# Patient Record
Sex: Female | Born: 1945 | Race: White | Hispanic: No | Marital: Married | Smoking: Former smoker
Health system: Southern US, Community
[De-identification: ages and names within clinical notes are randomized; demographics above are authoritative.]

## PROBLEM LIST (undated history)

## (undated) DIAGNOSIS — M4800 Spinal stenosis, site unspecified: Secondary | ICD-10-CM

## (undated) DIAGNOSIS — Z7901 Long term (current) use of anticoagulants: Secondary | ICD-10-CM

## (undated) DIAGNOSIS — Z8679 Personal history of other diseases of the circulatory system: Secondary | ICD-10-CM

## (undated) DIAGNOSIS — Z955 Presence of coronary angioplasty implant and graft: Secondary | ICD-10-CM

## (undated) DIAGNOSIS — Z9889 Other specified postprocedural states: Secondary | ICD-10-CM

## (undated) DIAGNOSIS — Z85828 Personal history of other malignant neoplasm of skin: Secondary | ICD-10-CM

## (undated) DIAGNOSIS — I480 Paroxysmal atrial fibrillation: Secondary | ICD-10-CM

## (undated) DIAGNOSIS — R251 Tremor, unspecified: Secondary | ICD-10-CM

## (undated) DIAGNOSIS — M79604 Pain in right leg: Secondary | ICD-10-CM

## (undated) DIAGNOSIS — K08109 Complete loss of teeth, unspecified cause, unspecified class: Secondary | ICD-10-CM

## (undated) DIAGNOSIS — Z8719 Personal history of other diseases of the digestive system: Secondary | ICD-10-CM

## (undated) DIAGNOSIS — I2510 Atherosclerotic heart disease of native coronary artery without angina pectoris: Secondary | ICD-10-CM

## (undated) DIAGNOSIS — K219 Gastro-esophageal reflux disease without esophagitis: Secondary | ICD-10-CM

## (undated) DIAGNOSIS — Z973 Presence of spectacles and contact lenses: Secondary | ICD-10-CM

## (undated) DIAGNOSIS — M1990 Unspecified osteoarthritis, unspecified site: Secondary | ICD-10-CM

## (undated) DIAGNOSIS — I10 Essential (primary) hypertension: Secondary | ICD-10-CM

## (undated) DIAGNOSIS — I341 Nonrheumatic mitral (valve) prolapse: Secondary | ICD-10-CM

## (undated) DIAGNOSIS — Z972 Presence of dental prosthetic device (complete) (partial): Secondary | ICD-10-CM

## (undated) DIAGNOSIS — Z87442 Personal history of urinary calculi: Secondary | ICD-10-CM

## (undated) DIAGNOSIS — R7301 Impaired fasting glucose: Secondary | ICD-10-CM

## (undated) DIAGNOSIS — Z87440 Personal history of urinary (tract) infections: Secondary | ICD-10-CM

## (undated) DIAGNOSIS — M79605 Pain in left leg: Secondary | ICD-10-CM

## (undated) HISTORY — DX: Spinal stenosis, site unspecified: M48.00

## (undated) HISTORY — PX: CARDIAC ELECTROPHYSIOLOGY MAPPING AND ABLATION: SHX1292

## (undated) HISTORY — DX: Essential (primary) hypertension: I10

## (undated) HISTORY — DX: Pain in left leg: M79.604

## (undated) HISTORY — PX: PILONIDAL CYST EXCISION: SHX744

## (undated) HISTORY — PX: BACK SURGERY: SHX140

## (undated) HISTORY — PX: CARDIOVASCULAR STRESS TEST: SHX262

## (undated) HISTORY — DX: Atherosclerotic heart disease of native coronary artery without angina pectoris: I25.10

## (undated) HISTORY — PX: TRANSTHORACIC ECHOCARDIOGRAM: SHX275

## (undated) HISTORY — PX: CATARACT EXTRACTION W/ INTRAOCULAR LENS  IMPLANT, BILATERAL: SHX1307

## (undated) HISTORY — PX: CARDIAC CATHETERIZATION: SHX172

## (undated) HISTORY — PX: CORONARY ANGIOPLASTY WITH STENT PLACEMENT: SHX49

## (undated) HISTORY — DX: Tremor, unspecified: R25.1

## (undated) HISTORY — PX: CARPAL TUNNEL RELEASE: SHX101

---
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
## 1971-07-03 HISTORY — PX: APPENDECTOMY: SHX54

## 1971-07-03 HISTORY — PX: CHOLECYSTECTOMY OPEN: SUR202

## 1973-07-02 HISTORY — PX: VAGINAL HYSTERECTOMY: SUR661

## 2007-07-03 HISTORY — PX: MOHS SURGERY: SHX181

## 2009-04-01 ENCOUNTER — Ambulatory Visit: Payer: Self-pay | Admitting: Internal Medicine

## 2009-04-01 ENCOUNTER — Inpatient Hospital Stay (HOSPITAL_COMMUNITY): Admission: EM | Admit: 2009-04-01 | Discharge: 2009-04-05 | Payer: Self-pay | Admitting: Internal Medicine

## 2009-04-01 ENCOUNTER — Encounter: Payer: Self-pay | Admitting: Internal Medicine

## 2009-04-01 DIAGNOSIS — I10 Essential (primary) hypertension: Secondary | ICD-10-CM

## 2009-04-01 DIAGNOSIS — I200 Unstable angina: Secondary | ICD-10-CM

## 2009-04-01 IMAGING — CR DG CHEST 2V
2 series · 2 of 2 positions shown · non-contrast
Comparison: None

CLINICAL DATA: Unstable angina.

CHEST - 2 VIEW

[w chest pa]
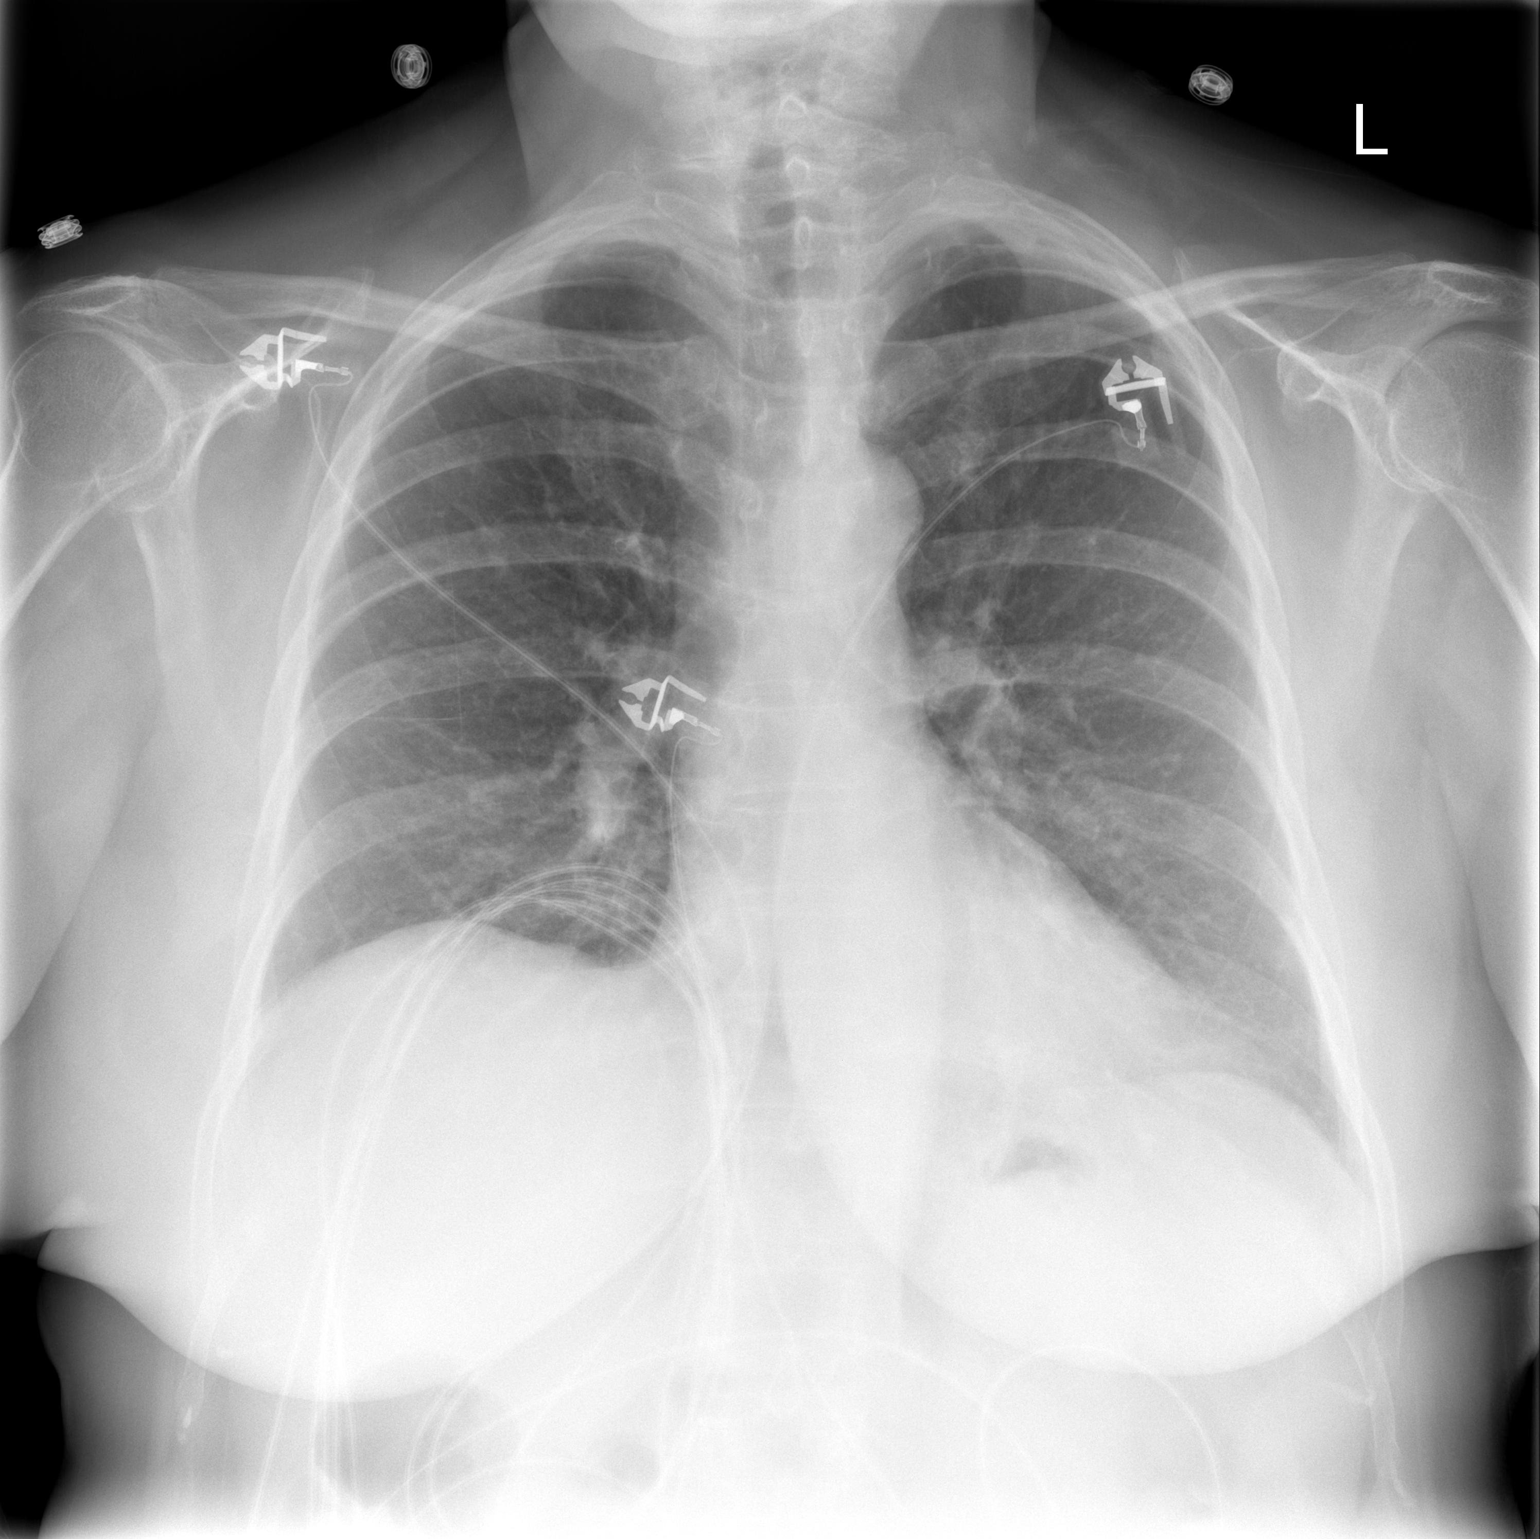

[w chest lat]
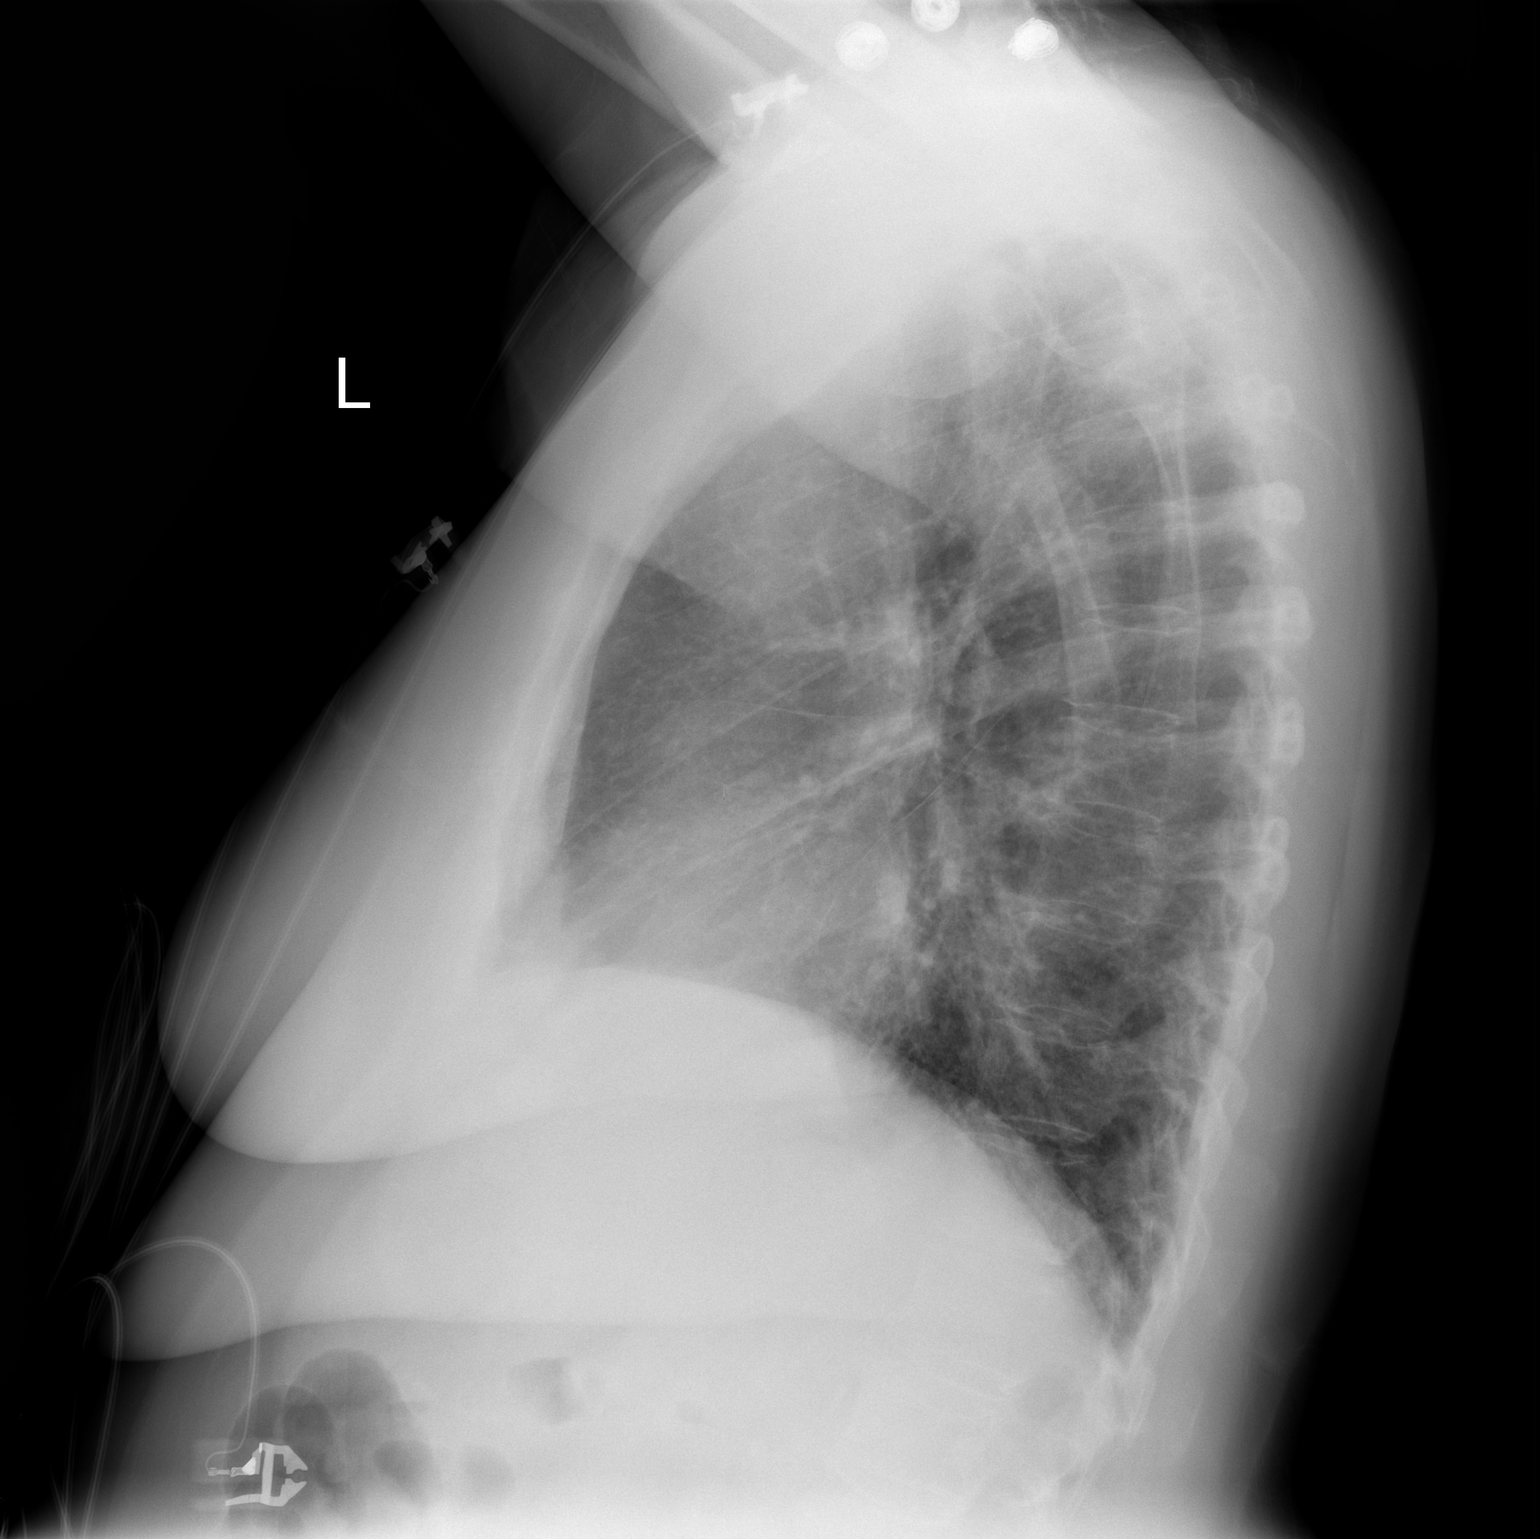

[2 of 2 positions shown; findings below may reference images not displayed]

FINDINGS: The heart size is normal.  Both lungs are clear.  There
is no evidence of pleural effusion.  No mass or adenopathy
identified.
IMPRESSION: No active disease.

## 2009-04-04 ENCOUNTER — Encounter: Payer: Self-pay | Admitting: Internal Medicine

## 2009-04-04 DIAGNOSIS — Z955 Presence of coronary angioplasty implant and graft: Secondary | ICD-10-CM

## 2009-04-04 HISTORY — DX: Presence of coronary angioplasty implant and graft: Z95.5

## 2009-04-11 ENCOUNTER — Encounter: Payer: Self-pay | Admitting: Physician Assistant

## 2009-04-11 ENCOUNTER — Ambulatory Visit: Payer: Self-pay | Admitting: Internal Medicine

## 2009-04-11 DIAGNOSIS — I2510 Atherosclerotic heart disease of native coronary artery without angina pectoris: Secondary | ICD-10-CM

## 2009-04-11 DIAGNOSIS — R0602 Shortness of breath: Secondary | ICD-10-CM

## 2009-04-11 DIAGNOSIS — R12 Heartburn: Secondary | ICD-10-CM

## 2009-04-11 DIAGNOSIS — E785 Hyperlipidemia, unspecified: Secondary | ICD-10-CM

## 2009-04-13 ENCOUNTER — Ambulatory Visit: Payer: Self-pay | Admitting: Internal Medicine

## 2009-04-13 IMAGING — CT CT ANGIO CHEST
1 of 8 series · 19 of 37 positions shown · IV contrast (Omnipaque 300)
Comparison: Chest x-ray [DATE]

CLINICAL DATA: Chest pain, tightness, shortness of breath and
cough.

CT ANGIOGRAPHY CHEST WITH CONTRAST
TECHNIQUE: Multidetector CT imaging of the chest was performed
using the standard protocol during bolus administration of
intravenous contrast. Multiplanar CT image reconstructions
including MIPs were obtained to evaluate the vascular anatomy.
Contrast: 80 ml [9A]

[Series 5: pe thins · axial · 0.67mm/px · z∈[-215,+15]mm · 19 of 256 slices shown]
[im 13/256  lung]
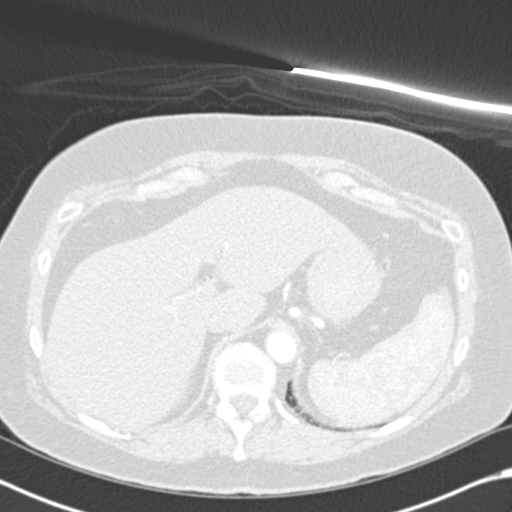
[im 26/256  mediastinal]
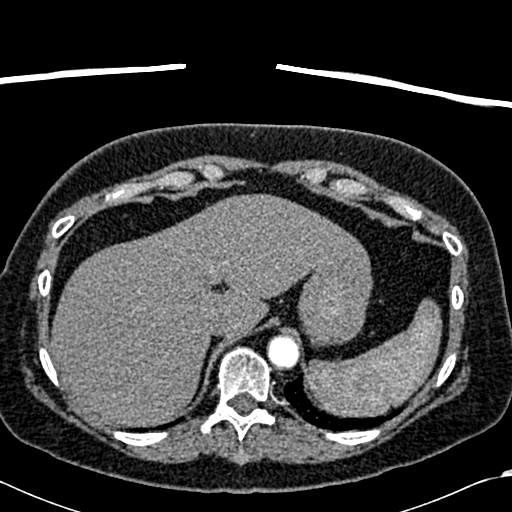
[im 39/256  lung]
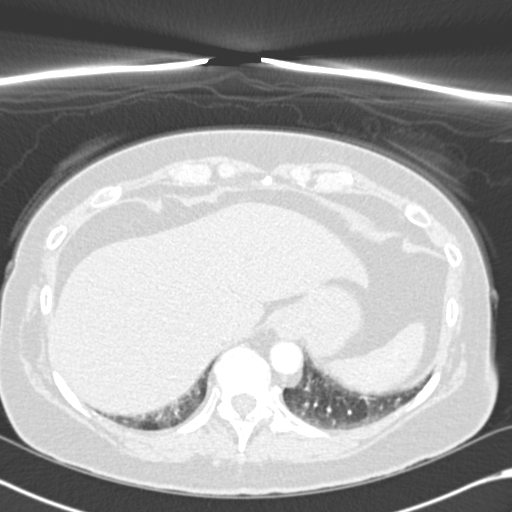
[im 52/256  mediastinal]
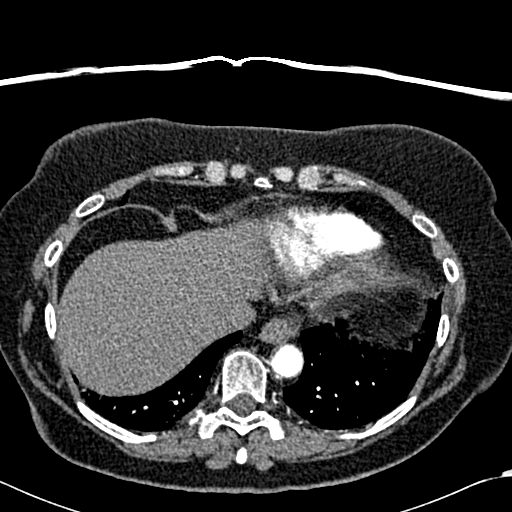
[im 64/256  lung]
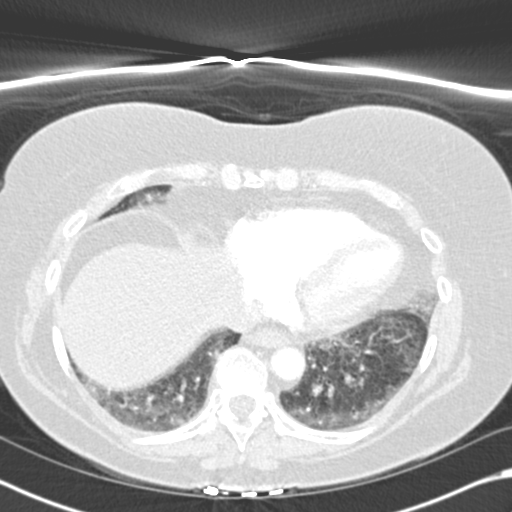
[im 77/256  mediastinal]
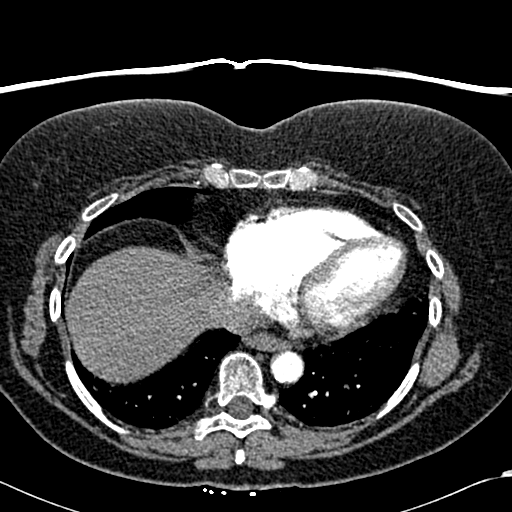
[im 90/256  lung]
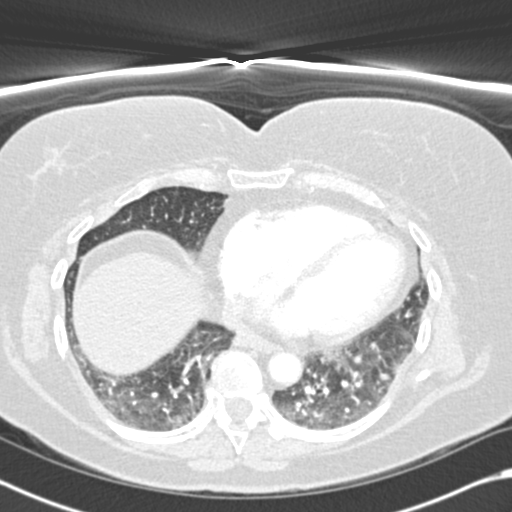
[im 103/256  mediastinal]
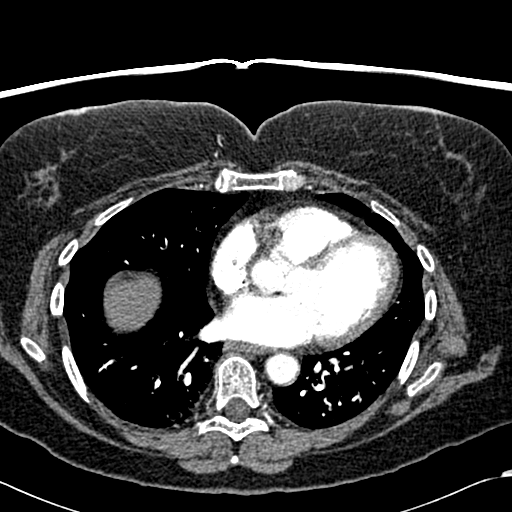
[im 115/256  lung]
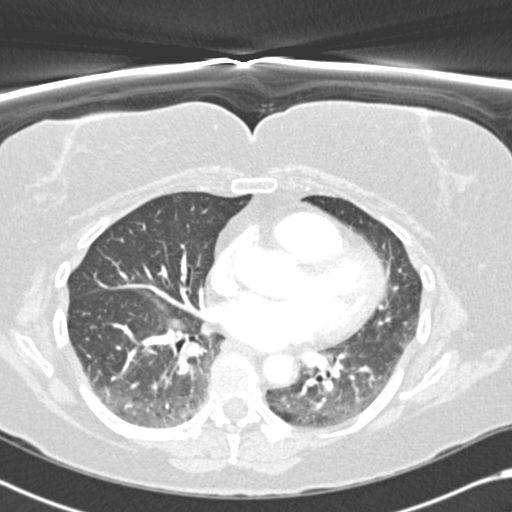
[im 128/256  mediastinal]
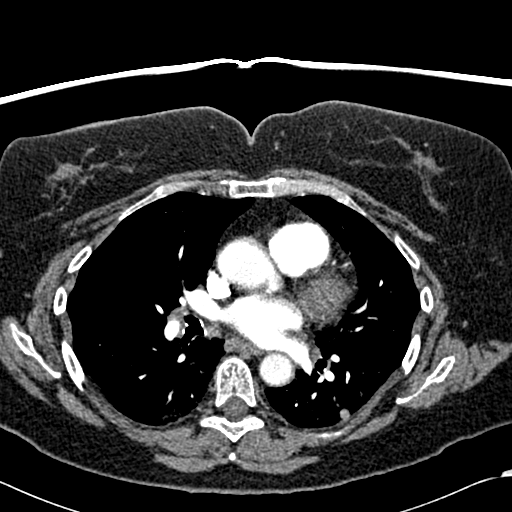
[im 141/256  lung]
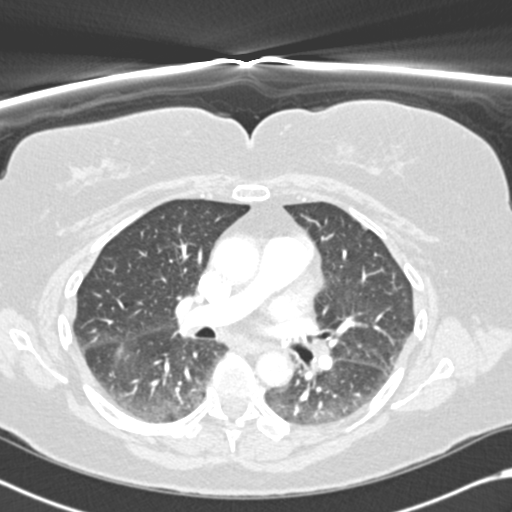
[im 154/256  mediastinal]
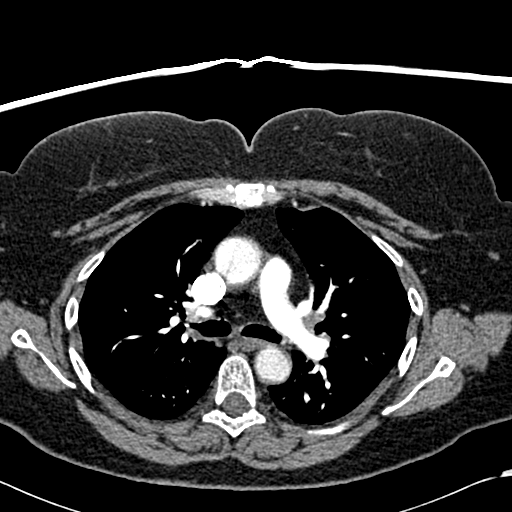
[im 166/256  lung]
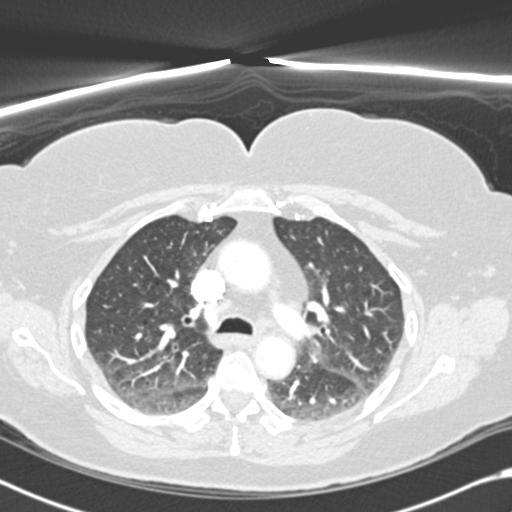
[im 179/256  mediastinal]
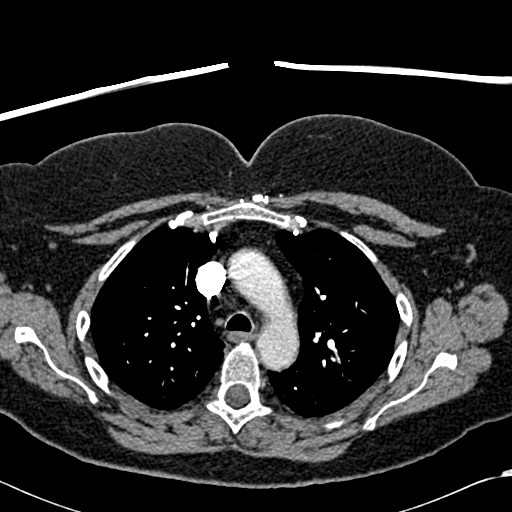
[im 192/256  lung]
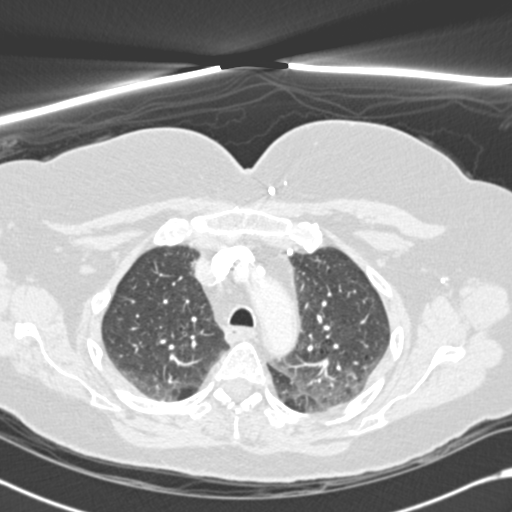
[im 205/256  mediastinal]
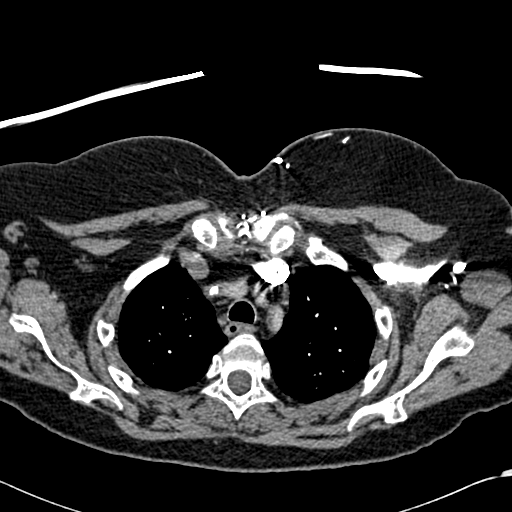
[im 217/256  lung]
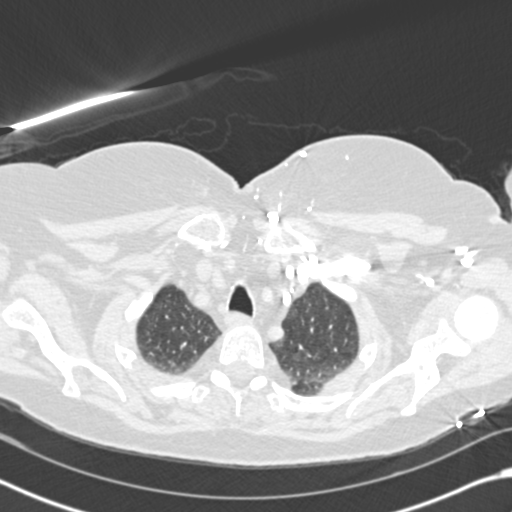
[im 230/256  mediastinal]
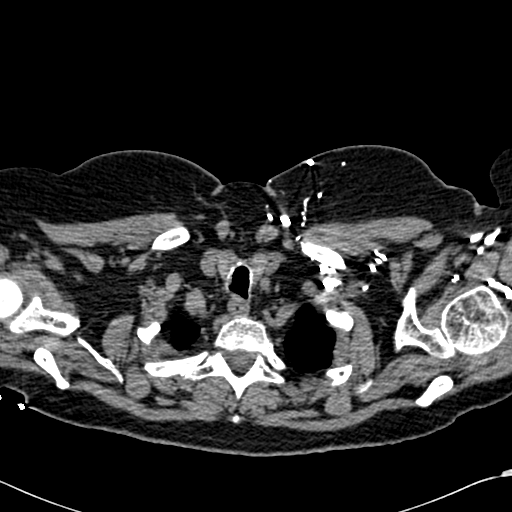
[im 243/256  lung]
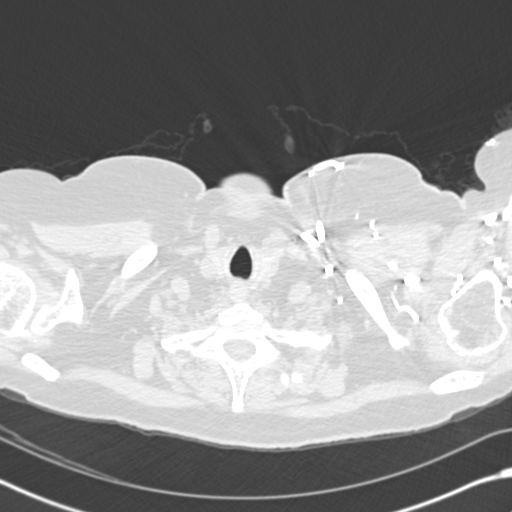

[19 of 37 positions shown; findings below may reference images not displayed]

FINDINGS: No pulmonary embolus.  Mediastinal lymph nodes measure
up to 9 mm in short axis, in the high right paratracheal station.
Bihilar lymphoid tissue is seen as well.  No axillary adenopathy.
Heart is at the upper limits of normal in size.  No pericardial
effusion.  Small hiatal hernia.

Lungs are mildly emphysematous.  There is dependent air space
disease seen predominantly in the lower lobes.  A 7 mm nodule is
seen in the superior segment left lower lobe (image 43).  There are
a few other scattered smaller pulmonary nodules, most of which are
perifissural in location. No pleural fluid.  Airway is
unremarkable.

Incidental imaging of the upper abdomen shows no acute findings.
No worrisome lytic or sclerotic lesions.

Review of the MIP images confirms the above findings.
IMPRESSION: 1.  No pulmonary embolus.
2.  Dependent air space disease may be due to atelectasis.  An
infectious process cannot be excluded.
3.  Left lower lobe nodule. If the patient is at high risk for
bronchogenic carcinoma, follow-up chest CT at 3-6 months is
recommended.  If the patient is at low risk for bronchogenic
carcinoma, follow-up chest CT at 6-12 months is recommended.  This
recommendation follows the consensus statement: "Guidelines for
Management of Small Pulmonary Nodules Detected on CT Scans: A
Statement from the [HOSPITAL]" as published in Radiology
[9A]; [DATE]. Online at:
[URL]
4.  Mediastinal and hilar lymph nodes may be reactive in etiology.

## 2009-04-14 ENCOUNTER — Encounter (HOSPITAL_COMMUNITY): Admission: RE | Admit: 2009-04-14 | Discharge: 2009-07-01 | Payer: Self-pay | Admitting: Internal Medicine

## 2009-04-14 ENCOUNTER — Encounter: Payer: Self-pay | Admitting: Internal Medicine

## 2009-04-15 ENCOUNTER — Telehealth: Payer: Self-pay | Admitting: Internal Medicine

## 2009-04-27 ENCOUNTER — Ambulatory Visit: Payer: Self-pay | Admitting: Internal Medicine

## 2009-04-27 ENCOUNTER — Ambulatory Visit: Payer: Self-pay | Admitting: Pulmonary Disease

## 2009-04-27 DIAGNOSIS — E119 Type 2 diabetes mellitus without complications: Secondary | ICD-10-CM

## 2009-04-27 DIAGNOSIS — Z85828 Personal history of other malignant neoplasm of skin: Secondary | ICD-10-CM | POA: Insufficient documentation

## 2009-04-27 DIAGNOSIS — J84112 Idiopathic pulmonary fibrosis: Secondary | ICD-10-CM | POA: Insufficient documentation

## 2009-04-27 LAB — CONVERTED CEMR LAB
Basophils Absolute: 0 10*3/uL (ref 0.0–0.1)
Eosinophils Absolute: 0.2 10*3/uL (ref 0.0–0.7)
HCT: 37.7 % (ref 36.0–46.0)
Hemoglobin: 13 g/dL (ref 12.0–15.0)
Lymphs Abs: 1.2 10*3/uL (ref 0.7–4.0)
MCHC: 34.5 g/dL (ref 30.0–36.0)
MCV: 95.4 fL (ref 78.0–100.0)
Monocytes Absolute: 0.6 10*3/uL (ref 0.1–1.0)
Neutro Abs: 3.1 10*3/uL (ref 1.4–7.7)
Platelets: 182 10*3/uL (ref 150.0–400.0)
RDW: 12.7 % (ref 11.5–14.6)

## 2009-04-28 DIAGNOSIS — J984 Other disorders of lung: Secondary | ICD-10-CM

## 2009-05-11 ENCOUNTER — Ambulatory Visit: Payer: Self-pay | Admitting: Internal Medicine

## 2009-05-12 ENCOUNTER — Encounter: Payer: Self-pay | Admitting: Internal Medicine

## 2009-07-02 ENCOUNTER — Encounter (HOSPITAL_COMMUNITY): Admission: RE | Admit: 2009-07-02 | Discharge: 2009-08-05 | Payer: Self-pay | Admitting: Internal Medicine

## 2009-07-20 ENCOUNTER — Observation Stay (HOSPITAL_COMMUNITY): Admission: EM | Admit: 2009-07-20 | Discharge: 2009-07-21 | Payer: Self-pay | Admitting: Emergency Medicine

## 2009-07-20 ENCOUNTER — Encounter: Payer: Self-pay | Admitting: Internal Medicine

## 2009-07-20 ENCOUNTER — Ambulatory Visit: Payer: Self-pay | Admitting: Internal Medicine

## 2009-07-20 ENCOUNTER — Ambulatory Visit: Payer: Self-pay | Admitting: Cardiovascular Disease

## 2009-07-20 ENCOUNTER — Emergency Department (HOSPITAL_COMMUNITY): Admission: EM | Admit: 2009-07-20 | Discharge: 2009-07-20 | Payer: Self-pay | Admitting: Emergency Medicine

## 2009-07-20 IMAGING — CR DG CHEST 1V PORT
1 series · 1 of 1 positions shown · non-contrast
Comparison: [DATE]

CLINICAL DATA: New onset atrial fibrillation.

PORTABLE CHEST - 1 VIEW

[view not recorded]
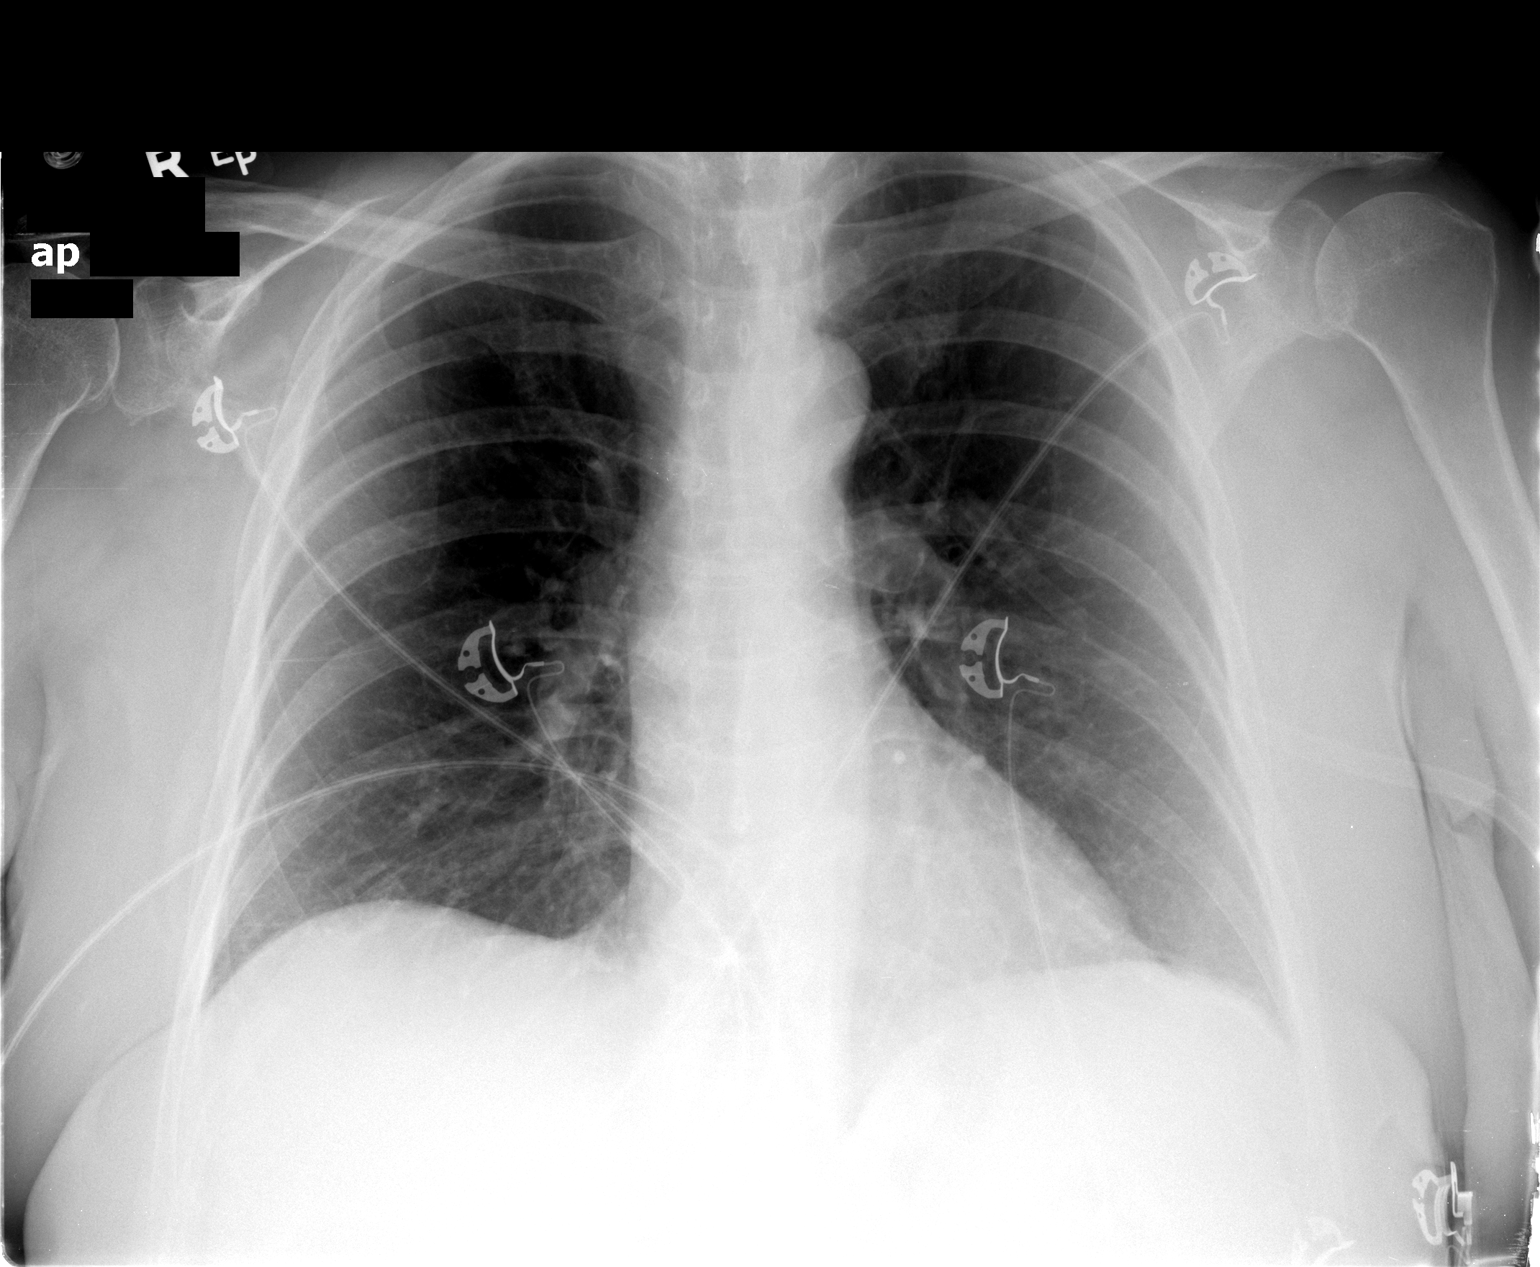

[1 of 1 positions shown; findings below may reference images not displayed]

FINDINGS: The cardiomediastinal silhouette is unremarkable.
The lungs are clear.
There is no evidence of focal airspace disease, pulmonary edema,
pleural effusion, or pneumothorax.
No acute bony abnormalities are identified.
IMPRESSION: No evidence of acute cardiopulmonary disease.

## 2009-07-22 ENCOUNTER — Encounter: Payer: Self-pay | Admitting: Internal Medicine

## 2009-07-25 ENCOUNTER — Ambulatory Visit: Payer: Self-pay | Admitting: Cardiology

## 2009-08-01 ENCOUNTER — Ambulatory Visit: Payer: Self-pay | Admitting: Internal Medicine

## 2009-08-01 ENCOUNTER — Ambulatory Visit (HOSPITAL_COMMUNITY): Admission: RE | Admit: 2009-08-01 | Discharge: 2009-08-01 | Payer: Self-pay | Admitting: Internal Medicine

## 2009-08-01 ENCOUNTER — Encounter: Payer: Self-pay | Admitting: Internal Medicine

## 2009-08-01 ENCOUNTER — Ambulatory Visit: Payer: Self-pay

## 2009-08-01 ENCOUNTER — Ambulatory Visit: Payer: Self-pay | Admitting: Cardiology

## 2009-08-01 ENCOUNTER — Encounter (INDEPENDENT_AMBULATORY_CARE_PROVIDER_SITE_OTHER): Payer: Self-pay | Admitting: Cardiology

## 2009-08-01 DIAGNOSIS — I4891 Unspecified atrial fibrillation: Secondary | ICD-10-CM

## 2009-08-01 LAB — CONVERTED CEMR LAB: POC INR: 2.6

## 2009-08-08 ENCOUNTER — Ambulatory Visit: Payer: Self-pay | Admitting: Cardiovascular Disease

## 2009-08-10 ENCOUNTER — Telehealth: Payer: Self-pay | Admitting: Internal Medicine

## 2009-08-17 ENCOUNTER — Ambulatory Visit: Payer: Self-pay | Admitting: Internal Medicine

## 2009-08-19 ENCOUNTER — Encounter: Payer: Self-pay | Admitting: Internal Medicine

## 2009-09-05 ENCOUNTER — Ambulatory Visit: Payer: Self-pay | Admitting: Internal Medicine

## 2009-09-05 LAB — CONVERTED CEMR LAB: POC INR: 1.6

## 2009-09-19 ENCOUNTER — Encounter (INDEPENDENT_AMBULATORY_CARE_PROVIDER_SITE_OTHER): Payer: Self-pay | Admitting: Cardiology

## 2009-09-19 ENCOUNTER — Ambulatory Visit: Payer: Self-pay | Admitting: Cardiology

## 2009-09-19 LAB — CONVERTED CEMR LAB: POC INR: 1.5

## 2009-09-26 ENCOUNTER — Ambulatory Visit: Payer: Self-pay | Admitting: Cardiovascular Disease

## 2009-09-26 LAB — CONVERTED CEMR LAB
INR: 1.9
POC INR: 1.9

## 2009-10-03 ENCOUNTER — Ambulatory Visit: Payer: Self-pay | Admitting: Cardiovascular Disease

## 2009-10-03 LAB — CONVERTED CEMR LAB: POC INR: 2

## 2009-10-04 ENCOUNTER — Telehealth (INDEPENDENT_AMBULATORY_CARE_PROVIDER_SITE_OTHER): Payer: Self-pay | Admitting: *Deleted

## 2009-10-17 ENCOUNTER — Encounter: Payer: Self-pay | Admitting: Internal Medicine

## 2009-10-17 ENCOUNTER — Ambulatory Visit: Payer: Self-pay | Admitting: Cardiology

## 2009-10-24 ENCOUNTER — Ambulatory Visit: Payer: Self-pay | Admitting: Cardiovascular Disease

## 2009-10-27 ENCOUNTER — Encounter: Payer: Self-pay | Admitting: Cardiovascular Disease

## 2009-10-28 ENCOUNTER — Encounter: Payer: Self-pay | Admitting: Cardiovascular Disease

## 2009-11-01 ENCOUNTER — Telehealth (INDEPENDENT_AMBULATORY_CARE_PROVIDER_SITE_OTHER): Payer: Self-pay | Admitting: *Deleted

## 2009-11-11 ENCOUNTER — Ambulatory Visit: Payer: Self-pay | Admitting: Cardiology

## 2009-12-02 ENCOUNTER — Ambulatory Visit: Payer: Self-pay | Admitting: Cardiovascular Disease

## 2009-12-02 LAB — CONVERTED CEMR LAB: POC INR: 3.8

## 2009-12-06 IMAGING — CR DG CHEST 2V
2 series · 2 of 2 positions shown · non-contrast
Comparison: [DATE].

CLINICAL DATA: Chest pain and shortness of breath.

CHEST - 2 VIEW

[w chest pa]
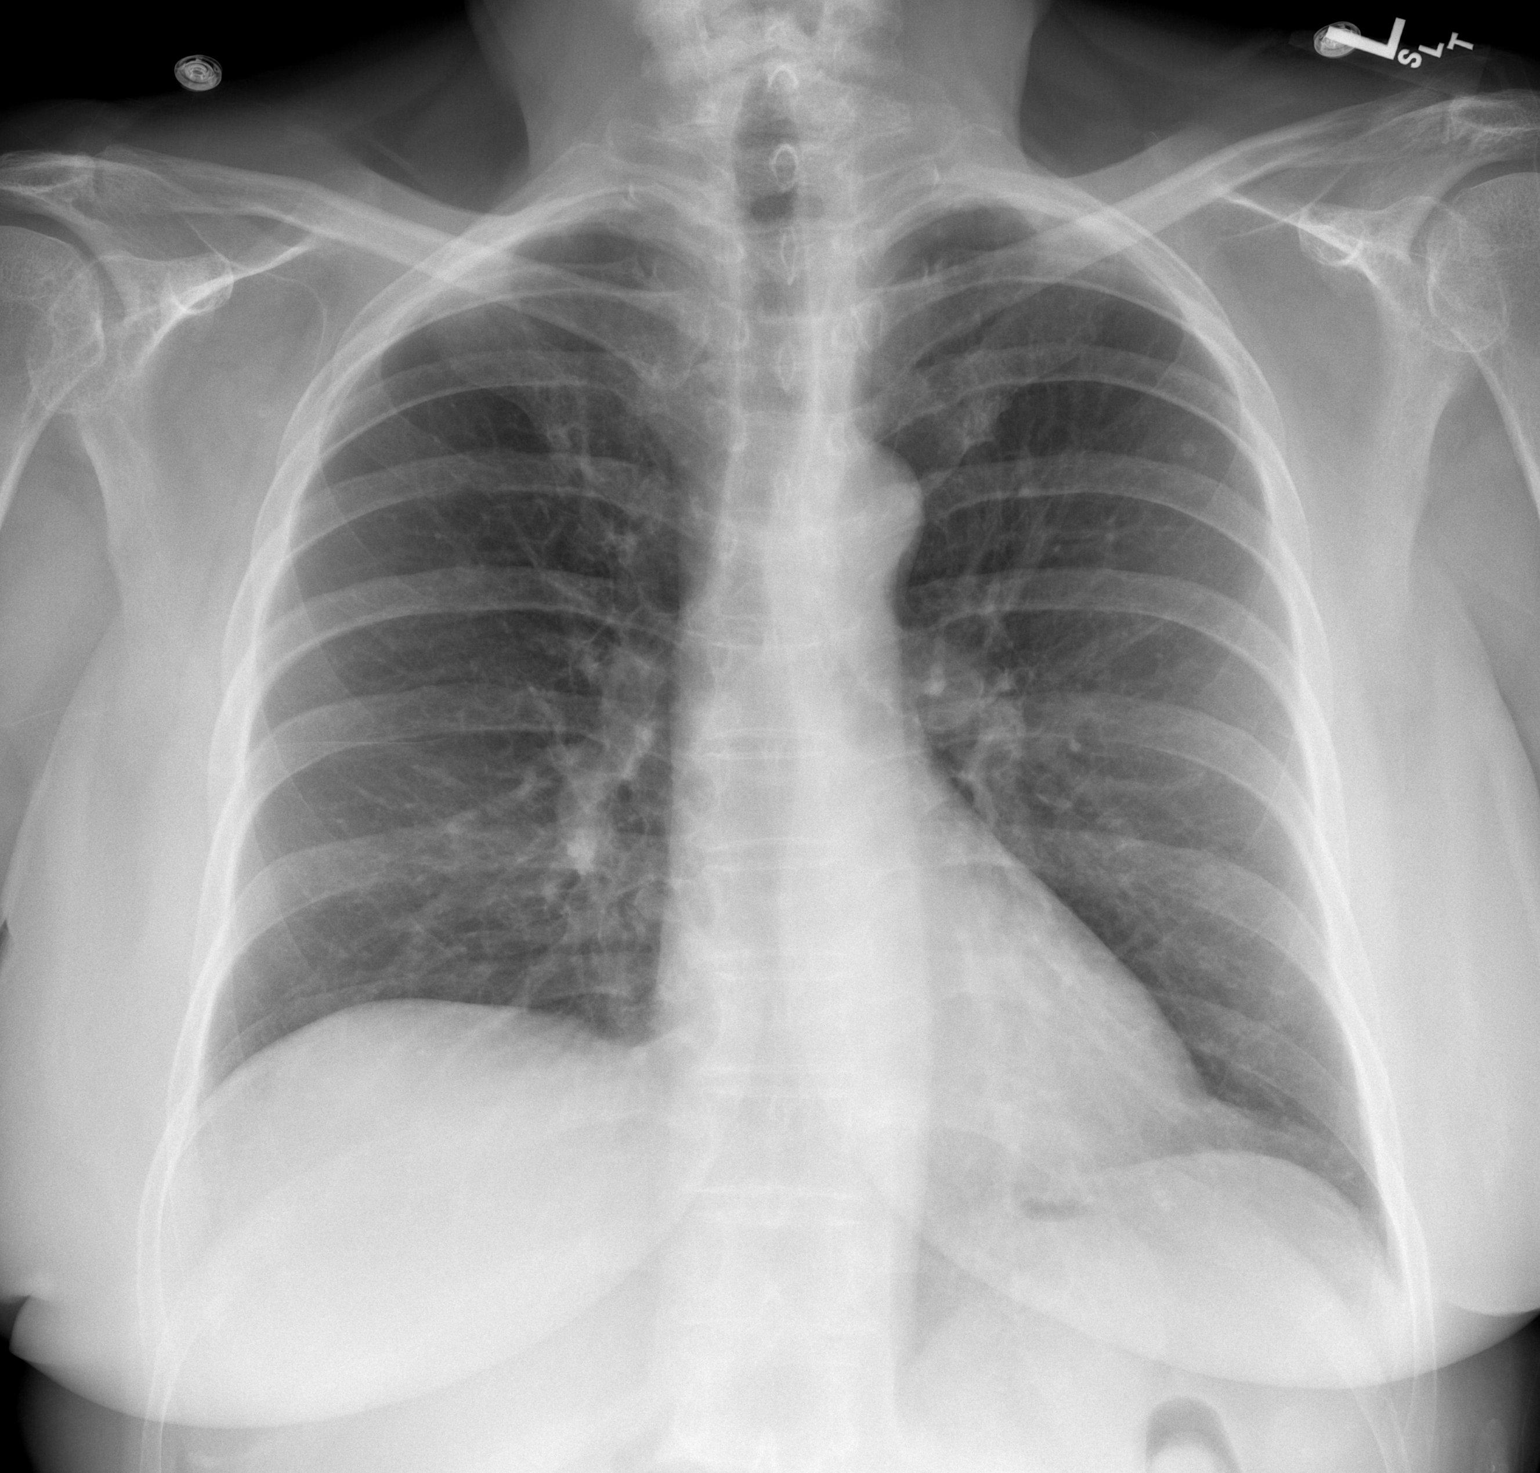

[w chest lat]
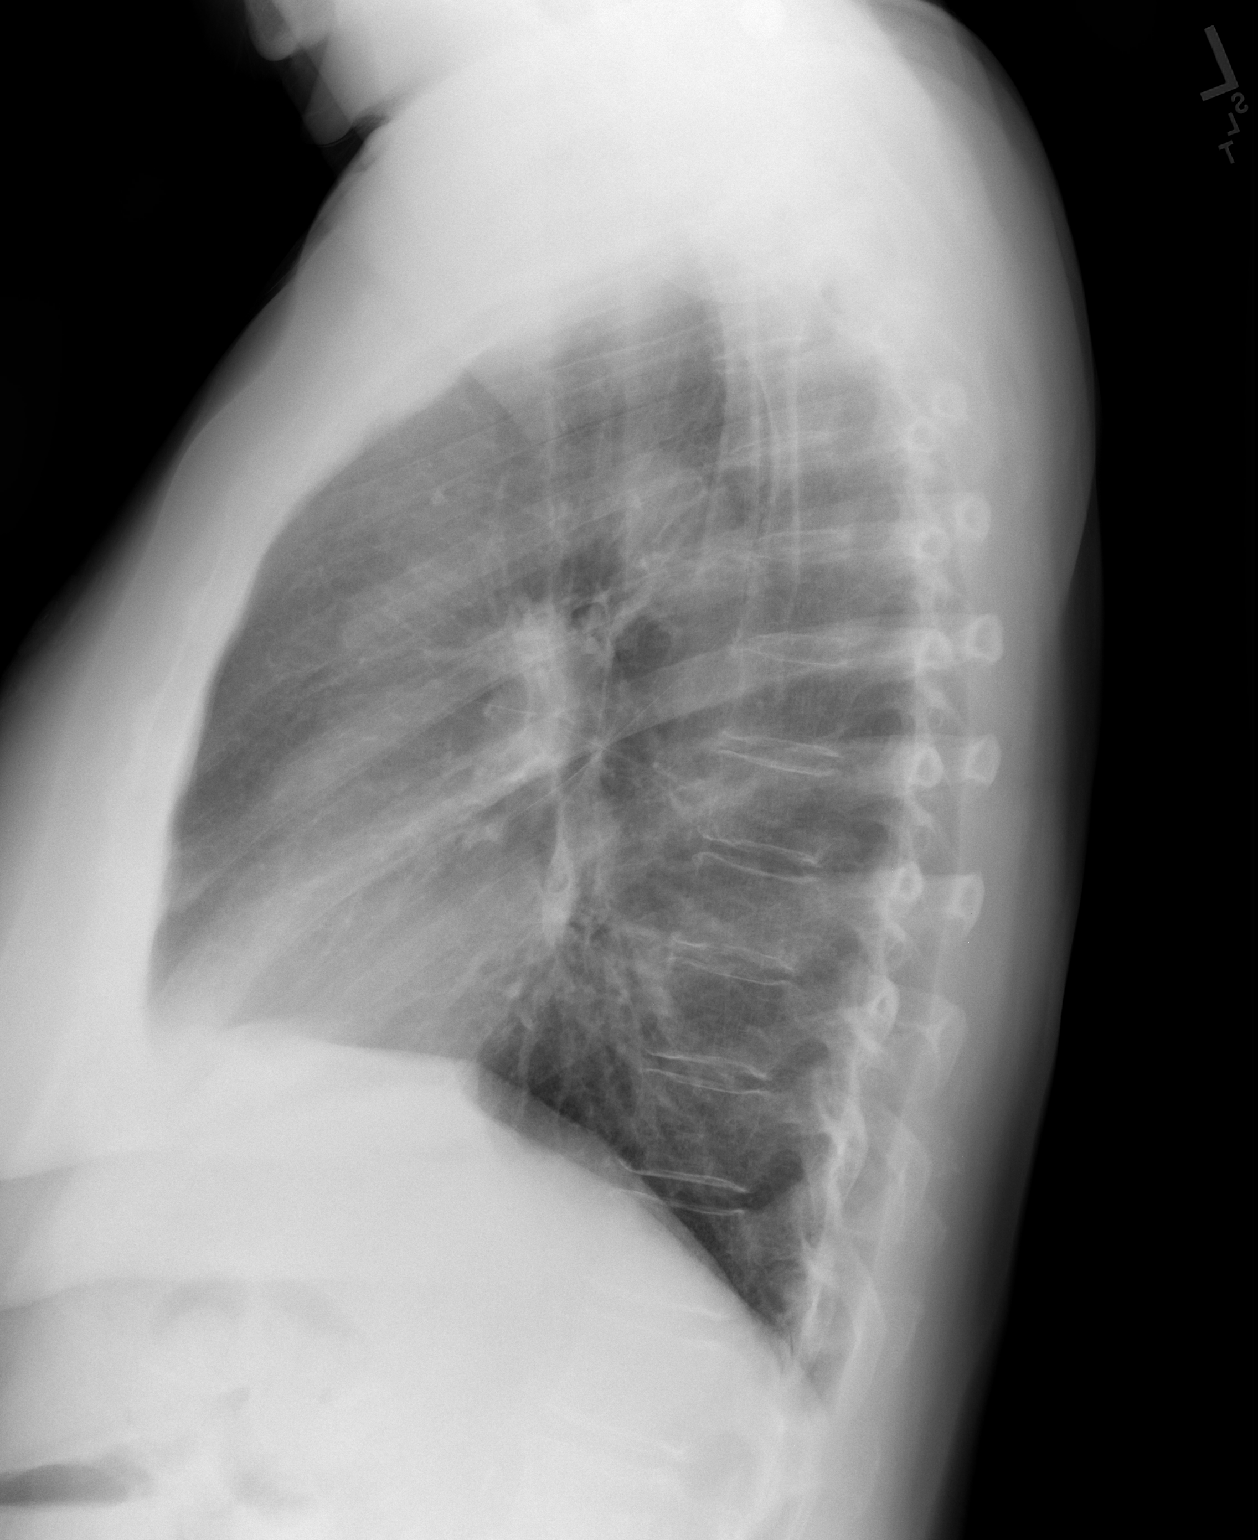

[2 of 2 positions shown; findings below may reference images not displayed]

FINDINGS: The heart size and mediastinal contours are normal.  The
lungs appear clear.  There is no pleural effusion.  No acute
osseous findings are demonstrated.
IMPRESSION: Stable examination.  No active cardiopulmonary process.

## 2009-12-07 ENCOUNTER — Ambulatory Visit: Payer: Self-pay | Admitting: Cardiology

## 2009-12-07 ENCOUNTER — Ambulatory Visit: Payer: Self-pay | Admitting: Family Medicine

## 2009-12-07 IMAGING — CT CT ABD-PELV W/O CM
2 of 4 series · 17 of 46 positions shown, 19 images · non-contrast
Comparison: None.

CLINICAL DATA: Left flank pain.  No known injury.  Evaluate for
hematoma.  The patient is on blood thinners.

CT ABDOMEN AND PELVIS WITHOUT CONTRAST
TECHNIQUE: Multidetector CT imaging of the abdomen and pelvis was
performed following the standard protocol without intravenous
contrast.

[Series 2: abd/pelv w/o 5.0 b31f st · axial · non-contrast · 0.81mm/px · z∈[-542,-96]mm · 14 of 99 slices shown, 16 images]
[im 5/99  soft-tissue]
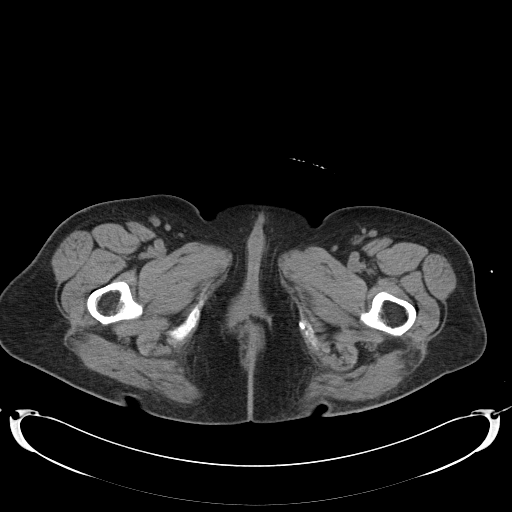
[im 5/99  bone]
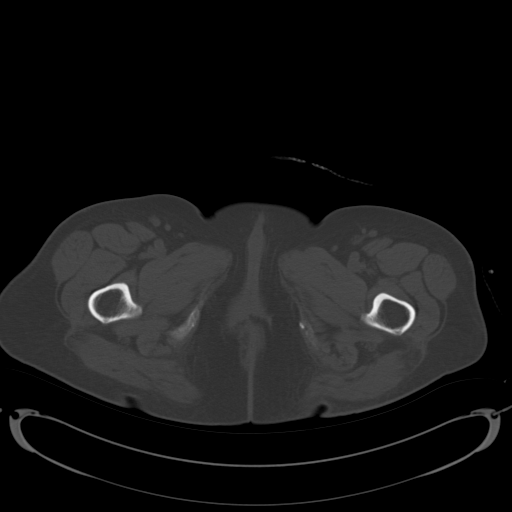
[im 13/99  soft-tissue]
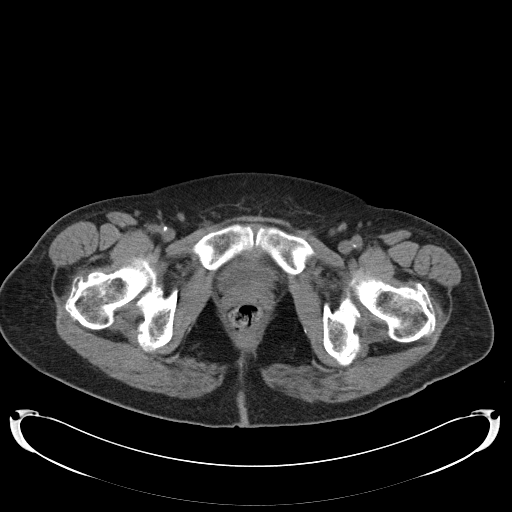
[im 21/99  soft-tissue]
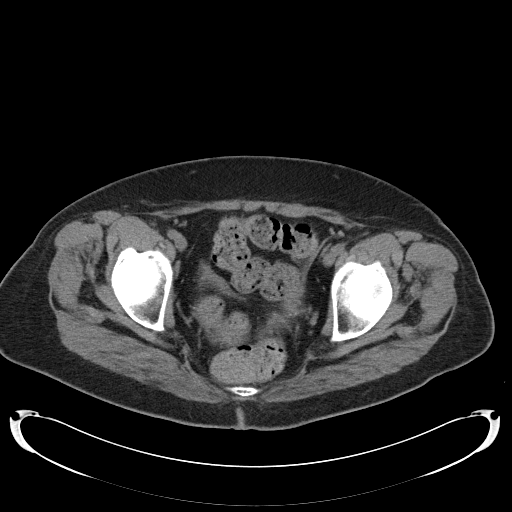
[im 25/99  soft-tissue]
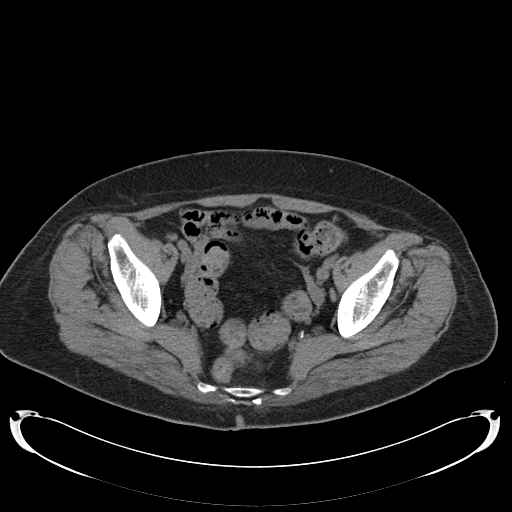
[im 33/99  soft-tissue]
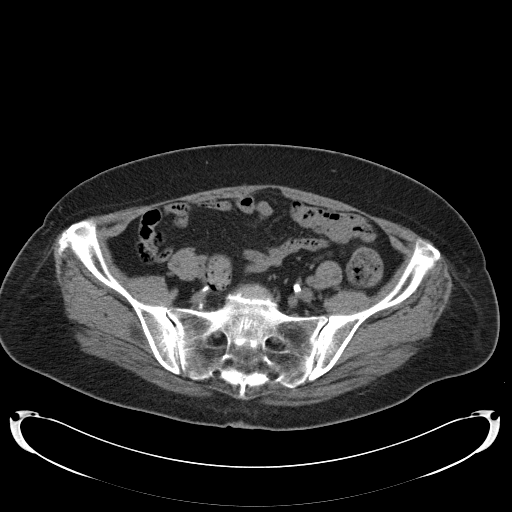
[im 41/99  soft-tissue]
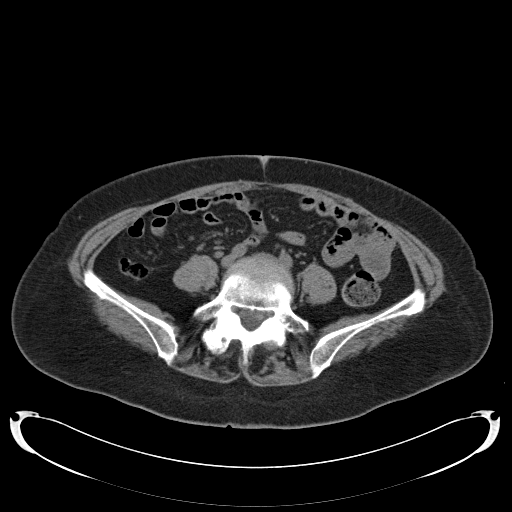
[im 45/99  soft-tissue]
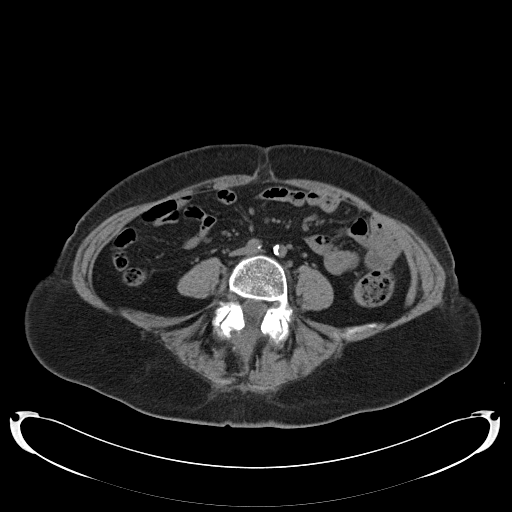
[im 54/99  soft-tissue]
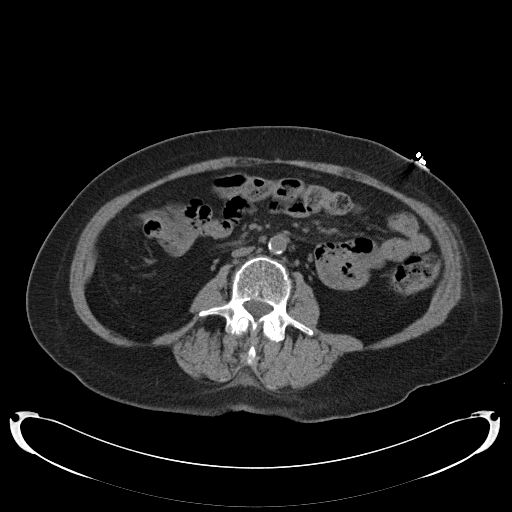
[im 58/99  soft-tissue]
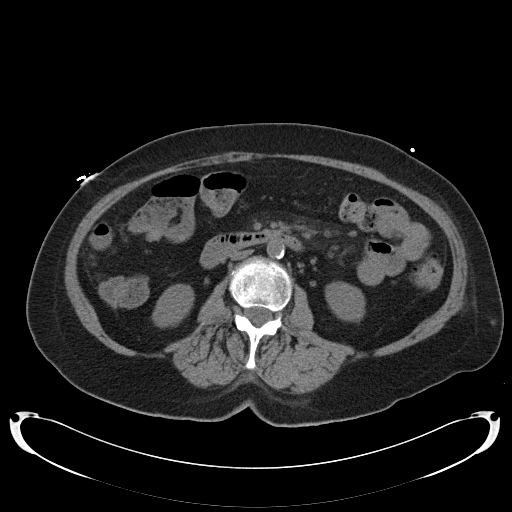
[im 58/99  bone]
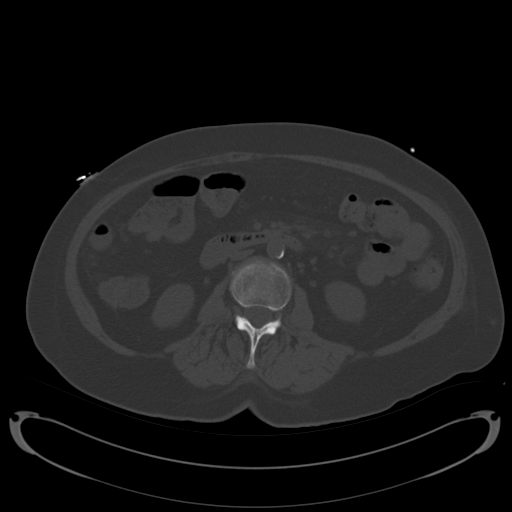
[im 66/99  soft-tissue]
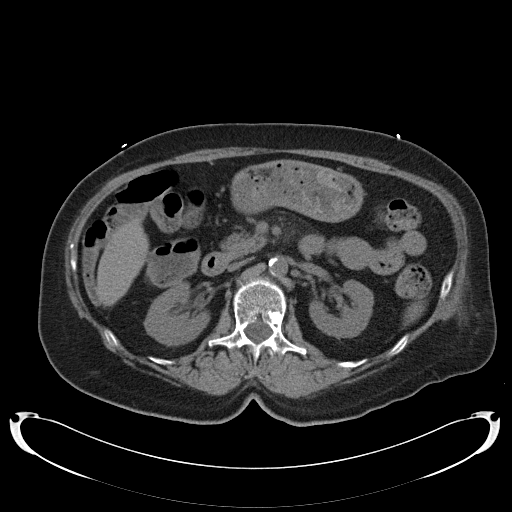
[im 74/99  soft-tissue]
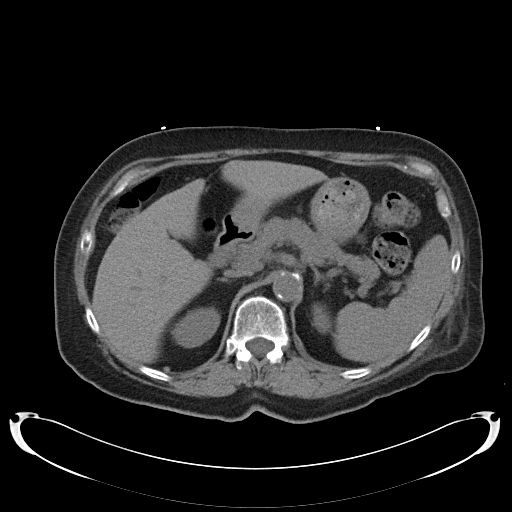
[im 78/99  soft-tissue]
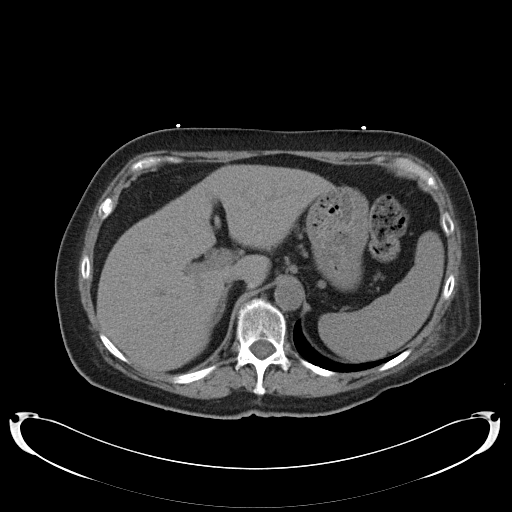
[im 86/99  soft-tissue]
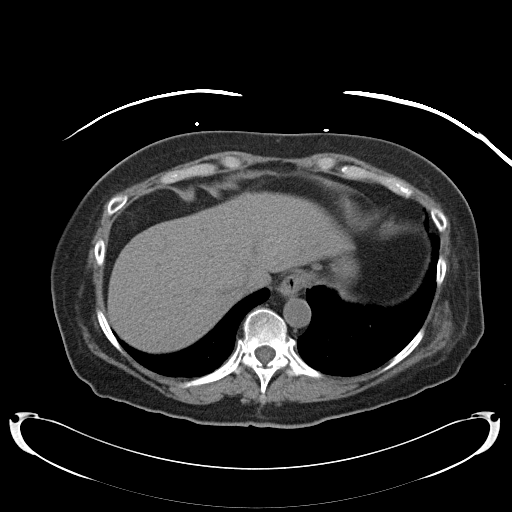
[im 94/99  soft-tissue]
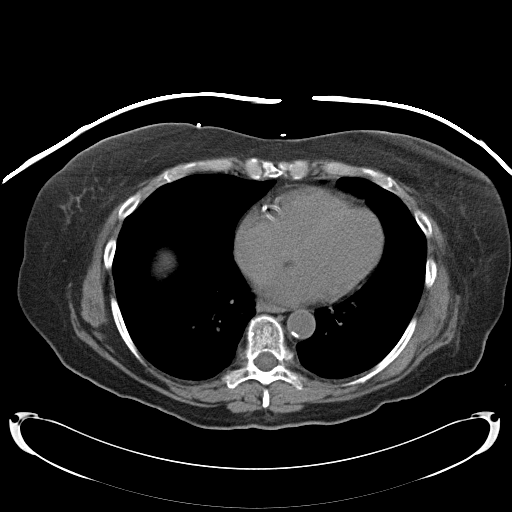

[Series 4: abd/pelv w/o 2.0 spo thins · coronal · non-contrast · 0.96mm/px · 3 of 120 slices shown]
[im 40/120  soft-tissue]
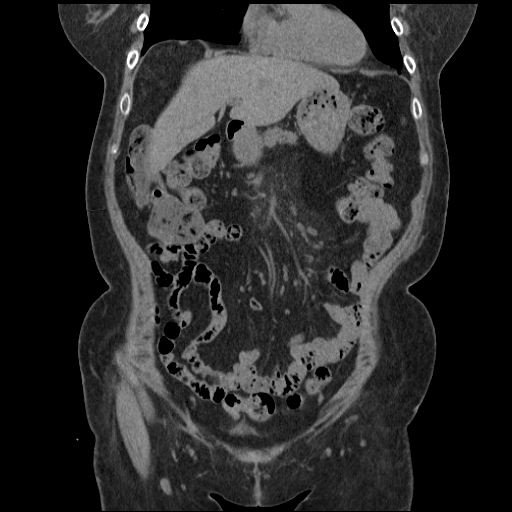
[im 53/120  soft-tissue]
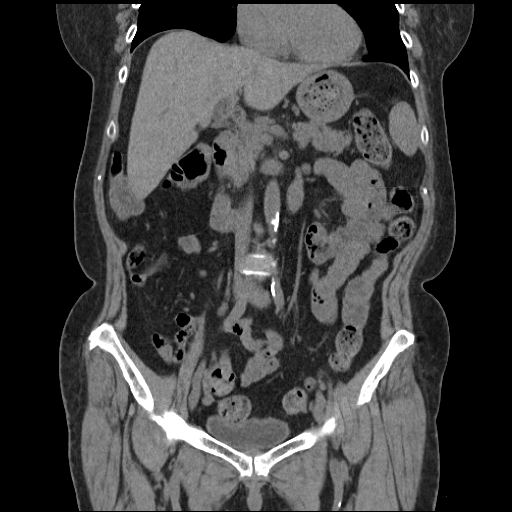
[im 67/120  soft-tissue]
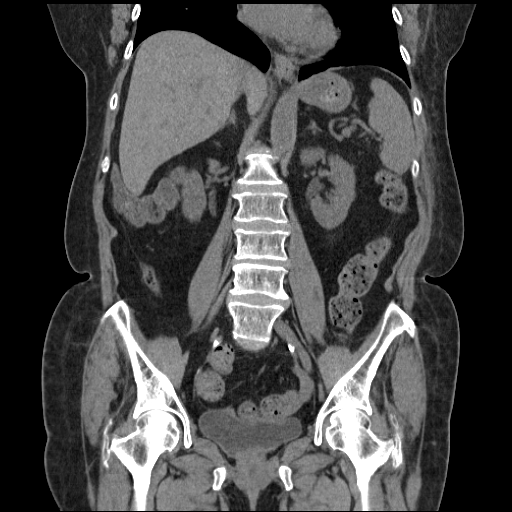

[17 of 46 positions shown; findings below may reference images not displayed]

FINDINGS: There are three sub centimeter subpleural nodules at the
left lung base on images 1, 5 and 10.  The right lung base is
clear.  There is no pleural effusion.

There is no evidence of intraperitoneal, retroperitoneal or
abdominal wall hemorrhage.  There is no hemoperitoneum.  The bowel
gas pattern is normal.  There are no fluid collections.

The liver, spleen, adrenal glands and kidneys appear normal.  There
is no hydronephrosis or urinary tract calculus.  The pancreas
appears normal.  There are numerous small mesenteric lymph nodes
with associated soft tissue stranding throughout the mesenteric
fat.  There is no retroperitoneal adenopathy.

The uterus is surgically absent and there is no adnexal mass.
Urinary bladder appears normal.  There is lumbar spondylosis status
post laminectomy and L4-L5.
IMPRESSION: 1.  No evidence of intra-abdominal hemorrhage.
2.  Multiple small mesenteric lymph nodes are associated with
stranding in the base of the mesentery.  This is a nonspecific
finding which can be seen with mesenteric adenitis.  An early or
treated lymphoproliferative process could have this appearance.  No
pathologically enlarged lymph nodes are identified.  Clinical
correlation and CT follow-up are recommended.
3.  No hydronephrosis or ureteral calculus.
4.  Small nodules at the left lung base are stable compared with
the prior chest CT.

## 2009-12-08 ENCOUNTER — Telehealth: Payer: Self-pay | Admitting: Internal Medicine

## 2009-12-15 ENCOUNTER — Telehealth: Payer: Self-pay | Admitting: Internal Medicine

## 2009-12-16 ENCOUNTER — Ambulatory Visit: Payer: Self-pay | Admitting: Cardiology

## 2009-12-16 DIAGNOSIS — R609 Edema, unspecified: Secondary | ICD-10-CM | POA: Insufficient documentation

## 2009-12-19 ENCOUNTER — Encounter (INDEPENDENT_AMBULATORY_CARE_PROVIDER_SITE_OTHER): Payer: Self-pay | Admitting: *Deleted

## 2009-12-19 LAB — CONVERTED CEMR LAB
BUN: 12 mg/dL (ref 6–23)
CO2: 28 meq/L (ref 19–32)
Chloride: 111 meq/L (ref 96–112)
Potassium: 4.3 meq/L (ref 3.5–5.1)

## 2009-12-23 ENCOUNTER — Ambulatory Visit: Payer: Self-pay | Admitting: Cardiology

## 2009-12-27 ENCOUNTER — Encounter (INDEPENDENT_AMBULATORY_CARE_PROVIDER_SITE_OTHER): Payer: Self-pay | Admitting: *Deleted

## 2009-12-27 LAB — CONVERTED CEMR LAB
CO2: 29 meq/L (ref 19–32)
Calcium: 9 mg/dL (ref 8.4–10.5)
Glucose, Bld: 75 mg/dL (ref 70–99)
Sodium: 142 meq/L (ref 135–145)

## 2009-12-28 ENCOUNTER — Ambulatory Visit: Payer: Self-pay | Admitting: Internal Medicine

## 2010-01-10 ENCOUNTER — Encounter: Payer: Self-pay | Admitting: Cardiology

## 2010-04-05 ENCOUNTER — Ambulatory Visit: Payer: Self-pay | Admitting: Internal Medicine

## 2010-04-10 ENCOUNTER — Ambulatory Visit: Payer: Self-pay | Admitting: Cardiology

## 2010-04-10 LAB — CONVERTED CEMR LAB
AST: 18 units/L (ref 0–37)
Albumin: 4.3 g/dL (ref 3.5–5.2)
Alkaline Phosphatase: 48 units/L (ref 39–117)
Total Protein: 6.7 g/dL (ref 6.0–8.3)

## 2010-04-17 ENCOUNTER — Ambulatory Visit: Payer: Self-pay | Admitting: Cardiology

## 2010-04-24 ENCOUNTER — Ambulatory Visit: Payer: Self-pay | Admitting: Internal Medicine

## 2010-04-24 LAB — CONVERTED CEMR LAB: POC INR: 2.9

## 2010-05-08 ENCOUNTER — Ambulatory Visit: Payer: Self-pay | Admitting: Internal Medicine

## 2010-05-29 ENCOUNTER — Ambulatory Visit: Payer: Self-pay | Admitting: Cardiology

## 2010-05-30 ENCOUNTER — Telehealth: Payer: Self-pay | Admitting: Internal Medicine

## 2010-06-08 ENCOUNTER — Inpatient Hospital Stay (HOSPITAL_COMMUNITY): Admission: EM | Admit: 2010-06-08 | Discharge: 2009-12-08 | Payer: Self-pay | Admitting: Emergency Medicine

## 2010-06-21 ENCOUNTER — Ambulatory Visit: Payer: Self-pay | Admitting: Cardiology

## 2010-07-06 ENCOUNTER — Telehealth: Payer: Self-pay | Admitting: Internal Medicine

## 2010-07-07 ENCOUNTER — Encounter: Payer: Self-pay | Admitting: Internal Medicine

## 2010-07-07 ENCOUNTER — Ambulatory Visit
Admission: RE | Admit: 2010-07-07 | Discharge: 2010-07-07 | Payer: Self-pay | Source: Home / Self Care | Attending: Internal Medicine | Admitting: Internal Medicine

## 2010-07-07 ENCOUNTER — Ambulatory Visit: Admission: RE | Admit: 2010-07-07 | Discharge: 2010-07-07 | Payer: Self-pay | Source: Home / Self Care

## 2010-07-07 ENCOUNTER — Telehealth: Payer: Self-pay | Admitting: Internal Medicine

## 2010-07-07 LAB — CONVERTED CEMR LAB: POC INR: 5.7

## 2010-07-14 ENCOUNTER — Ambulatory Visit: Admission: RE | Admit: 2010-07-14 | Discharge: 2010-07-14 | Payer: Self-pay | Source: Home / Self Care

## 2010-07-14 LAB — CONVERTED CEMR LAB: POC INR: 2.6

## 2010-07-21 ENCOUNTER — Ambulatory Visit: Admission: RE | Admit: 2010-07-21 | Discharge: 2010-07-21 | Payer: Self-pay | Source: Home / Self Care

## 2010-07-28 ENCOUNTER — Ambulatory Visit: Admission: RE | Admit: 2010-07-28 | Discharge: 2010-07-28 | Payer: Self-pay | Source: Home / Self Care

## 2010-07-28 LAB — CONVERTED CEMR LAB: POC INR: 3.1

## 2010-08-01 NOTE — Medication Information (Signed)
Summary: Refill Authorization Request Form  Refill Authorization Request Form   Imported By: Roderic Ovens 11/09/2009 14:33:31  _____________________________________________________________________  External Attachment:    Type:   Image     Comment:   External Document

## 2010-08-01 NOTE — Progress Notes (Signed)
  Walk in Patient Form Recieved " Pt left paper to be filled out for Handicapped Sticker" forwarded to [employer]  October 04, 2009 9:37 AM

## 2010-08-01 NOTE — Assessment & Plan Note (Signed)
Summary: per check out/sf   Visit Type:  Follow-up Referring Rexanne Inocencio:  Dr. Johney Frame Primary Leanne Sisler:  Dr Jimmy Picket   History of Present Illness: Ms Fonseca presents today for cardiology follow-up.  She reports doing well since last being seen in our clinic.  She continues to have palpitations lasting 1-2 hours every 5-6 days.  She is unaware of triggers for this.   She has coronary disease and had a Xience placed  in October of 2010.    She denies symptoms of chest pain, shortness of breath, orthopnea, PND, lower extremity edema, dizziness, presyncope, syncope, or neurologic sequela. The patient is tolerating medications without difficulties and is otherwise without complaint today.  She has had no further bleeding off coumadin.  Current Medications (verified): 1)  Amitriptyline Hcl 50 Mg Tabs (Amitriptyline Hcl) .[name].. 1 Tab Once Daily 2)  Glucophage 500 Mg Tabs (Metformin Hcl) .... 1/2 Tab Once Daily 3)  Vitamin B12 Shot .... Q Monthly 4)  Aspirin 81 Mg Tbec (Aspirin) .... Take One Tablet By Mouth Daily 5)  Plavix 75 Mg Tabs (Clopidogrel Bisulfate) .... Take One Daily 6)  Crestor 40 Mg Tabs (Rosuvastatin Calcium) .... Take One Daily 7)  Nitrostat 0.4 Mg Subl (Nitroglycerin) .[name].. 1 Tablet Under Tongue At Onset of Chest Pain; You May Repeat Every 5 Minutes For Up To 3 Doses. 8)  Protonix 40 Mg Tbec (Pantoprazole Sodium) .... Take One Tablet By Mouth Daily 9)  Multaq 400 Mg Tabs (Dronedarone Hcl) .... Take 1 Tablet Twice A Day 10)  Colace 100 Mg Caps (Docusate Sodium) .[name].. 1 Tab Two Times A Day 11)  Ferrous Sulfate 325 (65 Fe) Mg Tabs (Ferrous Sulfate) .[name].. 1 Tab Two Times A Day 12)  Metoprolol Tartrate 25 Mg Tabs (Metoprolol Tartrate) .... 1/2 Tab Bid 13)  Lisinopril 10 Mg Tabs (Lisinopril) .... Take One Tablet By Mouth Daily 14)  Hydrochlorothiazide 25 Mg Tabs (Hydrochlorothiazide) .... Take One Tablet By Mouth Daily.  Allergies (verified): No Known Drug Allergies  Past  History:  Past Medical History: Reviewed history from 08/17/2009 and no changes required. Arthritis DIABETES MELLITUS, TYPE II (ICD-250.00) SKIN CANCER, HX OF (ICD-V10.83) HYPERLIPIDEMIA-MIXED (ICD-272.4) CAD, NATIVE VESSEL (ICD-414.01) s/p PCI RCA (mid) with a XIENCE stent 04/04/09 Paroxysmal atrial fibrillation ESSENTIAL HYPERTENSION, BENIGN (ICD-401.1)    Past Surgical History: Reviewed history from 04/27/2009 and no changes required. TAH Appendectomy Cholecystectomy Spinal cyst removed with three surgeries required Scalp BCC removed Hysterectomy Stent place 04-04-2009  Social History: Reviewed history from 04/01/2009 and no changes required. Pt lives in [place] with husband.  Retired from [employer].  Tob- quit 1991 (previously 1PPD x 20 years).  ETOH- rare.  Drugs- denies  Review of Systems       All systems are reviewed and negative except as listed in the HPI.   Vital Signs:  Patient profile:   65 year old female Height:      62 inches Weight:      167 pounds BMI:     30.66 Pulse rate:   76 / minute BP sitting:   106 / 62  (left arm)  Vitals Entered By: Laurance Flatten CMA (April 05, 2010 2:31 PM)  Physical Exam  General:  Well developed, well nourished, in no acute distress. Head:  normocephalic and atraumatic Eyes:  PERRLA/EOM intact; conjunctiva and lids normal. Mouth:  Teeth, gums and palate normal. Oral mucosa normal. Neck:  Neck supple, no JVD. No masses, thyromegaly or abnormal cervical nodes. Lungs:  Clear bilaterally  to auscultation and percussion. Heart:  Non-displaced PMI, chest non-tender; regular rate and rhythm, S1, S2 without murmurs, rubs or gallops. Carotid upstroke normal, no bruit. Normal abdominal aortic size, no bruits. Femorals normal pulses, no bruits. Pedals normal pulses. No edema, no varicosities. Abdomen:  Bowel sounds positive; abdomen soft and non-tender without masses, organomegaly, or hernias noted. No hepatosplenomegaly. Msk:   Back normal, normal gait. Muscle strength and tone normal. Pulses:  pulses normal in all 4 extremities Extremities:  No clubbing or cyanosis. Neurologic:  Alert and oriented x 3. Skin:  Intact without lesions or rashes. Psych:  Normal affect.   EKG  Procedure date:  04/05/2010  Findings:      sinus rhythm 76 bpm, PR 192, Qtc 434, otherwise normal ekg  Impression & Recommendations:  Problem # 1:  ATRIAL FIBRILLATION (ICD-427.31) stable on multaq.  We will check LFTs today. We discussed recent results of PALLAS trial.  She has no symptoms of CHF and EF >40%.  Given her clinical benefit from [facility], she wishes to continue this medicine at this time.  She does not wish to switch to tikosyn or sotalol. We will restart coumadin today and stop  ASA/ Plavix (last stent 10/10).  She has had prior spontaneous bleeding and therefore would be a poor candidate for triple drug therapy.  Based on my prior discussions about Ms Ow with both Dr Riley Kill and Dr Juanda Chance, our plan was for 1 year of ASA/Plavix and then return to coumadin at this time.   The following medications were removed from the medication list:    Aspirin 81 Mg Tbec (Aspirin) .[name]... Take one tablet by mouth daily    Plavix 75 Mg Tabs (Clopidogrel bisulfate) .[name]... Take one daily    Coumadin 5 Mg Tabs (Warfarin sodium) ..... Use as directed Her updated medication list for this problem includes:    Multaq 400 Mg Tabs (Dronedarone hcl) .[name]... Take 1 tablet twice a day    Metoprolol Tartrate 25 Mg Tabs (Metoprolol tartrate) .[name]... 1/2 tab bid  Problem # 2:  CAD, NATIVE VESSEL (ICD-414.01) no sypmtoms of ischemia stop asa and plavix as above  Problem # 3:  ESSENTIAL HYPERTENSION, BENIGN (ICD-401.1) stable  Other Orders: EKG w/ Interpretation (93000) TLB-Hepatic/Liver Function Pnl (80076-HEPATIC)  Patient Instructions: 1)  Your physician recommends that you schedule a follow-up appointment in: 3 months with Dr Johney Frame 2)  Your  physician has recommended you make the following change in your medication: stop Plavix and aspirin  3)  restart Coumadin 2.5mg  after being off Plavix and aspirin for 3 days 4)  [facility] for [facility] Prescriptions: COUMADIN 2.5 MG TABS (WARFARIN SODIUM) as directed by coumadin clinic  #45 x 3   Entered by:   Dennis Bast, RN, BSN   Authorized by:   Hillis Range, MD   Signed by:   Dennis Bast, RN, BSN on 04/05/2010   Method used:   Electronically to        [id number] (retail)       [address]       [place], [place]  [address]       Ph: [phone] or [phone]       Fax: [phone]   RxID:   [id number]

## 2010-08-01 NOTE — Progress Notes (Signed)
Summary: Off Coumadin for 4 weeks  ---- Converted from flag ---- ---- 12/08/2009 2:29 PM, Hillis Range, MD wrote: I have stopped her coumadin.  Will reassess in 4 weeks.  ---- 12/07/2009 3:31 PM, Elaina Pattee wrote: [name] was admitted yesterday for CP to [facility].  I spoke to the pharmacist managing her Coumadin to make them aware of the lower INR range of 2-2.5.  Per her husband, bruising had decreased.  Thanks!  ---- 12/05/2009 3:16 PM, Hillis Range, MD wrote: plan sounds reasonable. you might have her come back by the coumadin clinic early this week to make sure that bruising is stable.  thanks  ---- 12/02/2009 2:44 PM, Elaina Pattee wrote: Dr. Excell Seltzer asked me to let you know he saw [name] for right/left flank bruising with unknown etiology and INR 3.8 today while she was in the Coumadin Clinic.  Please review the Coumadin Clinic note from 12/02/09 for details.  Thanks! ------------------------------

## 2010-08-01 NOTE — Medication Information (Signed)
Summary: [email or web address]  Anticoagulant Therapy  Managed by: Loma Newton, PharmD Referring MD: Johney Frame MD, Fayrene Fearing PCP: Dr Jimmy Picket Supervising MD: Clifton James MD, Cristal Deer Indication 1: Atrial Fibrillation Lab Used: [facility]  Site: [address] INR POC 1.9 INR RANGE 2.0-3.0  Dietary changes: no    Health status changes: no    Bleeding/hemorrhagic complications: no    Recent/future hospitalizations: no    Any changes in medication regimen? no    Recent/future dental: no  Any missed doses?: no       Is patient compliant with meds? yes      Comments: Pt. is having a lot of bruising on her arms and legs but no bleeding  Current Medications (verified): 1)  Lisinopril 10 Mg Tabs (Lisinopril) .[name].. 1 Tab Once Daily 2)  Lexapro 10 Mg Tabs (Escitalopram Oxalate) .[name].. 1 Tab Once Daily 3)  Amitriptyline Hcl 50 Mg Tabs (Amitriptyline Hcl) .[name].. 1 Tab Once Daily 4)  Glucophage 500 Mg Tabs (Metformin Hcl) .[name].. 1 Tab Once Daily 5)  Vitamin B12 Shot .... Q Monthly 6)  Aspirin 81 Mg Tbec (Aspirin) .... Take One Tablet By Mouth Daily 7)  Plavix 75 Mg Tabs (Clopidogrel Bisulfate) .... Take One Daily 8)  Crestor 40 Mg Tabs (Rosuvastatin Calcium) .... Take One Daily 9)  Nitroglycerin 0.4 Mg/hr Pt24 (Nitroglycerin) .... Take As Needed 10)  Protonix 40 Mg Tbec (Pantoprazole Sodium) .... Take One Tablet By Mouth Daily 11)  Multaq 400 Mg Tabs (Dronedarone Hcl) .... Take 1 Tablet Twice A Day 12)  Coumadin 5 Mg Tabs (Warfarin Sodium) .... Use As Directed  Allergies (verified): No Known Drug Allergies  Anticoagulation Management History:      The patient is taking warfarin and comes in today for a routine follow up visit.  Positive risk factors for bleeding include an age of 67 years or older and presence of serious comorbidities.  The bleeding index is 'intermediate risk'.  Positive CHADS2 values include History of HTN and History of Diabetes.  Negative CHADS2 values  include Age > 58 years old.  Today's INR is 1.9.  Anticoagulation responsible provider: Clifton James MD, Cristal Deer.  INR POC: 1.9.  Cuvette Lot#: [id number].  Exp: 10/2010.    Anticoagulation Management Assessment/Plan:      The patient's current anticoagulation dose is Coumadin 5 mg tabs: Use as directed.  The target INR is 2.0-3.0.  The next INR is due 10/03/2009.  Anticoagulation instructions were given to patient.  Results were reviewed/authorized by Loma Newton, PharmD.  She was notified by Loma Newton.         Prior Anticoagulation Instructions: INR 1.5  Take 2 tabs today, then increase to 1.5 tabs each Monday and Thursday and 1 tab on all other days.  Recheck in 10 days.   Current Anticoagulation Instructions: INR = 1.9 (goal = 2.3)  The patient's dosage of coumadin will be increased.  The new dosage includes: 1 tablet on all days except for mon, wed, friday take 1.5 tablets

## 2010-08-01 NOTE — Letter (Signed)
Summary: [facility]  [facility]   Imported By: Marylou Mccoy 10/19/2009 16:43:11  _____________________________________________________________________  External Attachment:    Type:   Image     Comment:   External Document

## 2010-08-01 NOTE — Assessment & Plan Note (Signed)
Summary: appt @ 10:15/eph/per dr Thanh Mottern/jml   Visit Type:  Follow-up Referring Provider:  Dr. Johney Frame Primary Provider:  Dr Jimmy Picket   History of Present Illness: [name] presents today for cardiology follow-up.  She was recently evaluated by Dr Juanda Chance for an unscheduled visit because of swelling in her lower extremities and hands. She has coronary disease and had a Xience placed  in October of 2010. She also has paroxysmal atrial fibrillation and has been managed with Coumadin and Multaq.  She was recently hospitalized for spontaneous retroperitoneal bleeding due to supratherpeutic INR and her creatinine was 1.5.  Her lisinopril/hydrochlorothiazide were discontinued and she developed swelling in her lower extremities.  Upon being evaluated by Dr Juanda Chance, her HCTZ and lisinopril were restarted.  Her symptoms have resolved and creatinine has been normal. She reports doing very well since that time. She denies symptoms of palpitations, chest pain, shortness of breath, orthopnea, PND, lower extremity edema, dizziness, presyncope, syncope, or neurologic sequela. The patient is tolerating medications without difficulties and is otherwise without complaint today.  She has had no further bleeding off coumadin.  Current Medications (verified): 1)  Amitriptyline Hcl 50 Mg Tabs (Amitriptyline Hcl) .[name].. 1 Tab Once Daily 2)  Glucophage 500 Mg Tabs (Metformin Hcl) .[name].. 1 Tab Once Daily 3)  Vitamin B12 Shot .... Q Monthly 4)  Aspirin 81 Mg Tbec (Aspirin) .... Take One Tablet By Mouth Daily 5)  Plavix 75 Mg Tabs (Clopidogrel Bisulfate) .... Take One Daily 6)  Crestor 40 Mg Tabs (Rosuvastatin Calcium) .... Take One Daily 7)  Nitrostat 0.4 Mg Subl (Nitroglycerin) .[name].. 1 Tablet Under Tongue At Onset of Chest Pain; You May Repeat Every 5 Minutes For Up To 3 Doses. 8)  Protonix 40 Mg Tbec (Pantoprazole Sodium) .... Take One Tablet By Mouth Daily 9)  Multaq 400 Mg Tabs (Dronedarone Hcl) .... Take 1 Tablet  Twice A Day 10)  Coumadin 5 Mg Tabs (Warfarin Sodium) .... Use As Directed 11)  Colace 100 Mg Caps (Docusate Sodium) .[name].. 1 Tab Two Times A Day 12)  Ferrous Sulfate 325 (65 Fe) Mg Tabs (Ferrous Sulfate) .[name].. 1 Tab Two Times A Day 13)  Metoprolol Tartrate 25 Mg Tabs (Metoprolol Tartrate) .... 1/2 Tab Bid 14)  Lisinopril 10 Mg Tabs (Lisinopril) .... Take One Tablet By Mouth Daily 15)  Hydrochlorothiazide 25 Mg Tabs (Hydrochlorothiazide) .... Take One Tablet By Mouth Daily.  Allergies (verified): No Known Drug Allergies  Past History:  Past Medical History: Reviewed history from 08/17/2009 and no changes required. Arthritis DIABETES MELLITUS, TYPE II (ICD-250.00) SKIN CANCER, HX OF (ICD-V10.83) HYPERLIPIDEMIA-MIXED (ICD-272.4) CAD, NATIVE VESSEL (ICD-414.01) s/p PCI RCA (mid) with a XIENCE stent 04/04/09 Paroxysmal atrial fibrillation ESSENTIAL HYPERTENSION, BENIGN (ICD-401.1)    Past Surgical History: Reviewed history from 04/27/2009 and no changes required. TAH Appendectomy Cholecystectomy Spinal cyst removed with three surgeries required Scalp BCC removed Hysterectomy Stent place 04-04-2009  Social History: Reviewed history from 04/01/2009 and no changes required. Pt lives in [place] with husband.  Retired from [employer].  Tob- quit 1991 (previously 1PPD x 20 years).  ETOH- rare.  Drugs- denies  Vital Signs:  Patient profile:   65 year old female Height:      62 inches Weight:      163 pounds BMI:     29.92 Pulse rate:   74 / minute BP sitting:   118 / 70  (left arm)  Vitals Entered By: Laurance Flatten CMA (December 28, 2009 11:06 AM)  Physical Exam  General:  Well developed, well nourished, in no acute distress. Head:  normocephalic and atraumatic Eyes:  PERRLA/EOM intact; conjunctiva and lids normal. Mouth:  Teeth, gums and palate normal. Oral mucosa normal. Neck:  Neck supple, no JVD. No masses, thyromegaly or abnormal cervical nodes. Lungs:  Clear bilaterally to  auscultation and percussion. Heart:  Non-displaced PMI, chest non-tender; regular rate and rhythm, S1, S2 without murmurs, rubs or gallops. Carotid upstroke normal, no bruit. Normal abdominal aortic size, no bruits. Femorals normal pulses, no bruits. Pedals normal pulses. No edema, no varicosities. Abdomen:  Bowel sounds positive; abdomen soft and non-tender without masses, organomegaly, or hernias noted. No hepatosplenomegaly. Msk:  Back normal, normal gait. Muscle strength and tone normal. Pulses:  pulses normal in all 4 extremities Extremities:  No clubbing or cyanosis. Neurologic:  Alert and oriented x 3.   Impression & Recommendations:  Problem # 1:  ATRIAL FIBRILLATION (ICD-427.31) Maintaining sinus rhythm.  We had a long discussion today regarding her risks for stroke and bleeding.  At this time, we will continue ASA and Plavix.  Once she is 1 year post stent placement (10/11), we will likely stop plavix and restart coumadin. As she is on multaq, I am reluctant to use pradaxa as we know that there is a 1.7-2 fold increase in serum concentration of pradaxa when combined with multaq and this would likely worsen her chances of bleeding. No changes were made today.  Problem # 2:  CAD, NATIVE VESSEL (ICD-414.01) Continue current medicine we will continue ASA and Plavix which she is currently tolerating without bleeding. Once her stent has been in place for 1 year (10/11) we will likely stop plavix and restart coumadin. I have spoken today with Dr Juanda Chance who placed her Xience stent to the RCA.  He is in agreement with this plan.  Problem # 3:  ESSENTIAL HYPERTENSION, BENIGN (ICD-401.1) stable no changes today  Patient Instructions: 1)  Your physician recommends that you schedule a follow-up appointment in: 4 months. 2)  Your physician recommends that you continue on your current medications as directed. Please refer to the Current Medication list given to you today.

## 2010-08-01 NOTE — Progress Notes (Signed)
  Phone Note Call from Patient   Caller: Patient Summary of Call: Pt called with new briuse on right arm.  Briuse is about 2 inches in diameter and red, pussy.  It sounds more like a bug bite and instructed her to see primary MD.  She is in [place].  So I instructed her to go to an urgent care. Initial call taken by: Leota Sauers Pharm.D.

## 2010-08-01 NOTE — Medication Information (Signed)
Summary: rov/sel  Anticoagulant Therapy  Managed by: Bethena Midget, RN, BSN Referring MD: Johney Frame MD, Fayrene Fearing PCP: Dr Jimmy Picket Supervising MD: Daleen Squibb MD, Maisie Fus Indication 1: Atrial Fibrillation Lab Used: [facility] [place] Site: [address] INR POC 2.4 INR RANGE 2.0-2.5  Dietary changes: no    Health status changes: no    Bleeding/hemorrhagic complications: no    Recent/future hospitalizations: no    Any changes in medication regimen? no    Recent/future dental: no  Any missed doses?: no       Is patient compliant with meds? yes      Comments: Restarted on 04/07/10  Allergies: No Known Drug Allergies  Anticoagulation Management History:      The patient is taking warfarin and comes in today for a routine follow up visit.  Positive risk factors for bleeding include an age of 67 years or older and presence of serious comorbidities.  The bleeding index is 'intermediate risk'.  Positive CHADS2 values include History of HTN and History of Diabetes.  Negative CHADS2 values include Age > 53 years old.  Her last INR was 1.9.  Anticoagulation responsible provider: Daleen Squibb MD, Maisie Fus.  INR POC: 2.4.  Cuvette Lot#: [id number].  Exp: 05/2011.    Anticoagulation Management Assessment/Plan:      The patient's current anticoagulation dose is Coumadin 2.5 mg tabs: as directed by coumadin clinic.  The target INR is 2.0-3.0.  The next INR is due 04/24/2010.  Anticoagulation instructions were given to patient.  Results were reviewed/authorized by Bethena Midget, RN, BSN.  She was notified by Bethena Midget, RN, BSN.         Prior Anticoagulation Instructions: INR 1.1  Take 2 tablets everyday except 2 1/2 tablets on Sunday, Tuesday, and Thursday. Recheck in 1 week.   Current Anticoagulation Instructions: INR 2.4 Continue 2 pills everyday except 2 1/2 pills on Tuesdays, Thursdays and Sundays. Recheck in 1week.

## 2010-08-01 NOTE — Letter (Signed)
Summary: Generic Letter  Architectural technologist, Main Office  [address]   [place], [place] [address]   Phone: [phone]  Fax: [phone]        December 19, 2009 MRN: [id number]    [facility] [address] [place], [place]  [address]    Dear Ms. Barradas,  This is just to let you know that your lab test ordered by Dr. Juanda Chance were normal. He had checked your electrolyte panel which looks at your sodium, potassium, and kidneys. If you have any questions, please call our office       Sincerely,  Sherri Rad, RN, BSN  This letter has been electronically signed by your physician.

## 2010-08-01 NOTE — Medication Information (Signed)
Summary: rov/ewj  Anticoagulant Therapy  Managed by: Weston Brass, PharmD Referring MD: Johney Frame MD, Fayrene Fearing PCP: Dr Jimmy Picket Supervising MD: Johney Frame MD, Fayrene Fearing Indication 1: Atrial Fibrillation Lab Used: [facility] [place] Site: [address] INR POC 2.2 INR RANGE 2.0-2.5  Dietary changes: no    Health status changes: no    Bleeding/hemorrhagic complications: no    Recent/future hospitalizations: no    Any changes in medication regimen? no    Recent/future dental: no  Any missed doses?: no       Is patient compliant with meds? yes       Allergies: No Known Drug Allergies  Anticoagulation Management History:      The patient is taking warfarin and comes in today for a routine follow up visit.  Positive risk factors for bleeding include an age of 88 years or older and presence of serious comorbidities.  The bleeding index is 'intermediate risk'.  Positive CHADS2 values include History of HTN and History of Diabetes.  Negative CHADS2 values include Age > 65 years old.  Her last INR was 1.9.  Anticoagulation responsible provider: Allred MD, Fayrene Fearing.  INR POC: 2.2.  Cuvette Lot#: [id number].  Exp: 06/2011.    Anticoagulation Management Assessment/Plan:      The patient's current anticoagulation dose is Coumadin 2.5 mg tabs: as directed by coumadin clinic.  The target INR is 2.0-3.0.  The next INR is due 05/29/2010.  Anticoagulation instructions were given to patient.  Results were reviewed/authorized by Weston Brass, PharmD.  She was notified by Weston Brass PharmD.         Prior Anticoagulation Instructions: INR 2.9  Continue on same dosage 2 tablets daily except 2.5 tablets on Sundays, Tuesdays, and Thursdays.  Recheck in 2 weeks.    Current Anticoagulation Instructions: INR 2.2  Continue same dose of 2 tablets every day except 2 1/2 tablets on Sunday, Tuesday and Thursday.  Recheck INR in 3 weeks.

## 2010-08-01 NOTE — Medication Information (Signed)
Summary: rov/sp  Anticoagulant Therapy  Managed by: Elaina Pattee, PharmD Referring MD: Johney Frame MD, Fayrene Fearing PCP: Dr Jimmy Picket Supervising MD: Excell Seltzer MD, Casimiro Needle Indication 1: Atrial Fibrillation Lab Used: [facility] [place] Site: [address] INR POC 3.8 INR RANGE 2.0-2.5  Dietary changes: no    Health status changes: no    Bleeding/hemorrhagic complications: yes       Details: Pt has large bruise on left side.  Unsure of cause.  Pt states this has increased since Tues night when she noticed it.  No falls.  Recent/future hospitalizations: no    Any changes in medication regimen? no    Recent/future dental: no  Any missed doses?: no       Is patient compliant with meds? yes      Comments: Per Dr. Excell Seltzer, based on hx of bruising/current bruises and increased risk of bleeding due to aspirin/Plavix/warfarin combination, decrease INR range to 2-2.5.  Pt cannot come off Plavix at this time because DES placed 04/2009.  Pt instructed to go to ER if pain or knots developed, or if bruising continued to increase.  Allergies: No Known Drug Allergies  Anticoagulation Management History:      The patient is taking warfarin and comes in today for a routine follow up visit.  Positive risk factors for bleeding include an age of 65 years or older and presence of serious comorbidities.  The bleeding index is 'intermediate risk'.  Positive CHADS2 values include History of HTN and History of Diabetes.  Negative CHADS2 values include Age > 60 years old.  Her last INR was 1.9.  Anticoagulation responsible provider: Excell Seltzer MD, Casimiro Needle.  INR POC: 3.8.  Cuvette Lot#: [id number].  Exp: 01/2011.    Anticoagulation Management Assessment/Plan:      The patient's current anticoagulation dose is Coumadin 5 mg tabs: Use as directed.  The target INR is 2.0-3.0.  The next INR is due 12/09/2009.  Anticoagulation instructions were given to patient.  Results were reviewed/authorized by Elaina Pattee, PharmD.  She was notified by Elaina Pattee, PharmD.         Prior Anticoagulation Instructions: INR 2.8  Continue same dose of 1 1/2 tablets every day except 1 tablet on Sunday and Thursday   Current Anticoagulation Instructions: INR 3.8. Hold Coumadin until Monday night. On Monday, start taking 1 tablet daily except 1.5 tablets on Mon, Wed, Fri.  Recheck in 1 week.   If you notice any knots or increasing pain in the stomach or back, go to ER.

## 2010-08-01 NOTE — Progress Notes (Signed)
Summary: c/o swelling legs, feet  Phone Note Call from Patient Call back at Home Phone [phone]   Caller: Patient Reason for Call: Talk to Nurse Summary of Call: c/o swelling in legs, feet, hands, b/p meds was changed . no cp, nor sob.  Initial call taken by: Lorne Skeens,  December 15, 2009 12:47 PM  Follow-up for Phone Call        pt was discharged from the hospital on 12/08/09.  Her Lisinopril/HCTZ was d/c secondary to acute renal failure which resolved prior to discharge.  She went out of town on [place] and returned today.  She c/o swelling that started on Thurs and has progressively gotten worse.  Swelling in hands and feet up to knee.  Denies SOB.  Will come in to see Dr Juanda Chance at 10:15 in the morning to be evaluated.  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

## 2010-08-01 NOTE — Medication Information (Signed)
Summary: rov.mp  Anticoagulant Therapy  Managed by: Louie Casa, PharmD Referring MD: Johney Frame MD, Fayrene Fearing PCP: Dr Jimmy Picket Supervising MD: Excell Seltzer MD, Casimiro Needle Indication 1: Atrial Fibrillation Lab Used: [facility] [place] Site: [address] INR POC 2.9 INR RANGE 2.0-3.0  Dietary changes: no    Health status changes: no    Bleeding/hemorrhagic complications: no    Recent/future hospitalizations: no    Any changes in medication regimen? no    Recent/future dental: no  Any missed doses?: no       Is patient compliant with meds? yes       Current Medications (verified): 1)  Lisinopril 10 Mg Tabs (Lisinopril) .[name].. 1 Tab Once Daily 2)  Lexapro 10 Mg Tabs (Escitalopram Oxalate) .[name].. 1 Tab Once Daily 3)  Amitriptyline Hcl 50 Mg Tabs (Amitriptyline Hcl) .[name].. 1 Tab Once Daily 4)  Glucophage 500 Mg Tabs (Metformin Hcl) .[name].. 1 Tab Once Daily 5)  Vitamin B12 Shot .... Q Monthly 6)  Aspirin 325 Mg  Tabs (Aspirin) .[name].. 1 Tab Once Daily (Pt Started Today 10.01.2010) 7)  Plavix 75 Mg Tabs (Clopidogrel Bisulfate) .... Take One Daily 8)  Crestor 40 Mg Tabs (Rosuvastatin Calcium) .... Take One Daily 9)  Nitroglycerin 0.4 Mg/hr Pt24 (Nitroglycerin) .... Take As Needed 10)  Pepcid 40 Mg Tabs (Famotidine) .... Take One Tablet By Mouth Once Daily 11)  Multaq 400 Mg Tabs (Dronedarone Hcl) .... Take 1 Tablet Twice A Day 12)  Coumadin 5 Mg Tabs (Warfarin Sodium) .... Use As Directed  Allergies (verified): No Known Drug Allergies  Anticoagulation Management History:      The patient is taking warfarin and comes in today for a routine follow up visit.  Positive risk factors for bleeding include an age of 67 years or older and presence of serious comorbidities.  The bleeding index is 'intermediate risk'.  Positive CHADS2 values include History of HTN and History of Diabetes.  Negative CHADS2 values include Age > 67 years old.  Anticoagulation responsible provider: Excell Seltzer MD,  Casimiro Needle.  INR POC: 2.9.  Cuvette Lot#: [id number].  Exp: 10/2010.    Anticoagulation Management Assessment/Plan:      The patient's current anticoagulation dose is Coumadin 5 mg tabs: Use as directed.  The next INR is due 09/05/2009.  Anticoagulation instructions were given to patient.  Results were reviewed/authorized by Louie Casa, PharmD.         Prior Anticoagulation Instructions: INR 2.6  Continue taking 1 tab daily.  Recheck in 1 week.    Current Anticoagulation Instructions: INR 2.9  CONTINUE TO TAKE 1 TABLET EVERYDAY. RECHECK IN 4 WEEKS.

## 2010-08-01 NOTE — Medication Information (Signed)
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 Johney Frame  Allergies: No Known Drug Allergies  Anticoagulation Management History:      Positive risk factors for bleeding include an age of 65 years or older and presence of serious comorbidities.  The bleeding index is 'intermediate risk'.  Positive CHADS2 values include History of HTN and History of Diabetes.  Negative CHADS2 values include Age > 76 years old.  Her last INR was 1.9.  Anticoagulation responsible provider: Excell Seltzer MD, Casimiro Needle.  Exp: 01/2011.    Anticoagulation Management Assessment/Plan:      The patient's current anticoagulation dose is Coumadin 5 mg tabs: Use as directed.  The target INR is 2.0-3.0.  The next INR is due 12/09/2009.  Anticoagulation instructions were given to patient.  Results were reviewed/authorized by Inactive.         Prior Anticoagulation Instructions: INR 3.8. Hold Coumadin until Monday night. On Monday, start taking 1 tablet daily except 1.5 tablets on Mon, Wed, Fri.  Recheck in 1 week.   If you notice any knots or increasing pain in the stomach or back, go to ER.

## 2010-08-01 NOTE — Medication Information (Signed)
Summary: Coumadin Clinic  Anticoagulant Therapy  Managed by: Lyna Poser, PharmD Referring MD: Johney Frame MD, Fayrene Fearing PCP: Dr Jimmy Picket Supervising MD: Riley Kill MD, Maisie Fus Indication 1: Atrial Fibrillation Lab Used: [facility] [place] Site: [address] INR POC 1.6 INR RANGE 2.0-2.5  Dietary changes: no    Health status changes: no    Bleeding/hemorrhagic complications: no    Recent/future hospitalizations: yes       Details: have colonscopy on 14th   Any changes in medication regimen? no    Recent/future dental: no  Any missed doses?: no       Is patient compliant with meds? yes       Allergies: No Known Drug Allergies  Anticoagulation Management History:      The patient is taking warfarin and comes in today for a routine follow up visit.  Positive risk factors for bleeding include an age of 65 years or older and presence of serious comorbidities.  The bleeding index is 'intermediate risk'.  Positive CHADS2 values include History of HTN and History of Diabetes.  Negative CHADS2 values include Age > 25 years old.  Her last INR was 1.9.  Anticoagulation responsible Traveon Louro: Riley Kill MD, Maisie Fus.  INR POC: 1.6.  Cuvette Lot#: [id number].  Exp: 06/2011.    Anticoagulation Management Assessment/Plan:      The patient's current anticoagulation dose is Coumadin 2.5 mg tabs: as directed by coumadin clinic.  The target INR is 2.0-3.0.  The next INR is due 06/21/2010.  Anticoagulation instructions were given to patient.  Results were reviewed/authorized by Lyna Poser, PharmD.         Prior Anticoagulation Instructions: INR 2.2  Continue same dose of 2 tablets every day except 2 1/2 tablets on Sunday, Tuesday and Thursday.  Recheck INR in 3 weeks.   Current Anticoagulation Instructions: INR 1.6 Today take an extra tablet. Then resume normal dosing schedule of 2 tablets on monday, wednesday, friday, and saturday. And 2.5 tablets all other days. Recheck in 3 weeks.

## 2010-08-01 NOTE — Medication Information (Signed)
Summary: ccn/. gd  Anticoagulant Therapy  Managed by: Bethena Midget, RN, BSN Referring MD: Johney Frame MD, Fayrene Fearing PCP: Dr Jimmy Picket Supervising MD: Antoine Poche MD, Fayrene Fearing Indication 1: Atrial Fibrillation Lab Used: [facility] [place] Site: [address] INR POC 1.4 INR RANGE 2.0-3.0  Dietary changes: no    Health status changes: no    Bleeding/hemorrhagic complications: no    Recent/future hospitalizations: yes       Details: Admitted 07/20/09 and discharged on 07/21/09  Any changes in medication regimen? yes       Details: Started Multag BID on 07/21/09.   Recent/future dental: no  Any missed doses?: no       Is patient compliant with meds? yes      Comments: Started warfarin on 07/21/09 with 5mg  daily.   Allergies: No Known Drug Allergies  Anticoagulation Management History:      The patient comes in today for her initial visit for anticoagulation therapy.  Positive risk factors for bleeding include an age of 65 years or older and presence of serious comorbidities.  The bleeding index is 'intermediate risk'.  Positive CHADS2 values include History of HTN and History of Diabetes.  Negative CHADS2 values include Age > 67 years old.  Anticoagulation responsible provider: Antoine Poche MD, Fayrene Fearing.  INR POC: 1.4.  Cuvette Lot#: [id number].  Exp: 08/2010.    Anticoagulation Management Assessment/Plan:      The next INR is due 08/01/2009.  Anticoagulation instructions were given to patient.  Results were reviewed/authorized by Bethena Midget, RN, BSN.  She was notified by Bethena Midget, RN, BSN.         Current Anticoagulation Instructions: INR 1.4 Today take 7.5mg s then resume 5mg s daily. Recheck on Friday or Monday per Doretha Imus.

## 2010-08-01 NOTE — Medication Information (Signed)
Summary: [name]  Anticoagulant Therapy  Managed by: Loma Newton, PharmD Referring MD: Johney Frame MD, Fayrene Fearing PCP: Dr Jimmy Picket Supervising MD: Juanda Chance MD, Cherron Blitzer Indication 1: Atrial Fibrillation Lab Used: [facility] [place] Site: [address] INR POC 1.5 INR RANGE 2.0-3.0  Dietary changes: no    Health status changes: no    Bleeding/hemorrhagic complications: no    Recent/future hospitalizations: no    Any changes in medication regimen? no    Recent/future dental: no  Any missed doses?: no       Is patient compliant with meds? yes      Comments: Gave patient two 5mg  tablets becasuse she just picked up her supply and I increased her dose by 1/2 tab per week.   Current Medications (verified): 1)  Lisinopril 10 Mg Tabs (Lisinopril) .[name].. 1 Tab Once Daily 2)  Lexapro 10 Mg Tabs (Escitalopram Oxalate) .[name].. 1 Tab Once Daily 3)  Amitriptyline Hcl 50 Mg Tabs (Amitriptyline Hcl) .[name].. 1 Tab Once Daily 4)  Glucophage 500 Mg Tabs (Metformin Hcl) .[name].. 1 Tab Once Daily 5)  Vitamin B12 Shot .... Q Monthly 6)  Aspirin 81 Mg Tbec (Aspirin) .... Take One Tablet By Mouth Daily 7)  Plavix 75 Mg Tabs (Clopidogrel Bisulfate) .... Take One Daily 8)  Crestor 40 Mg Tabs (Rosuvastatin Calcium) .... Take One Daily 9)  Nitroglycerin 0.4 Mg/hr Pt24 (Nitroglycerin) .... Take As Needed 10)  Protonix 40 Mg Tbec (Pantoprazole Sodium) .... Take One Tablet By Mouth Daily 11)  Multaq 400 Mg Tabs (Dronedarone Hcl) .... Take 1 Tablet Twice A Day 12)  Coumadin 5 Mg Tabs (Warfarin Sodium) .... Use As Directed  Allergies (verified): No Known Drug Allergies  Anticoagulation Management History:      Positive risk factors for bleeding include an age of 2 years or older and presence of serious comorbidities.  The bleeding index is 'intermediate risk'.  Positive CHADS2 values include History of HTN and History of Diabetes.  Negative CHADS2 values include Age > 43 years old.  Her last INR was  1.9.  Anticoagulation responsible provider: Juanda Chance MD, Smitty Cords.  INR POC: 1.5.  Exp: 10/2010.    Anticoagulation Management Assessment/Plan:      The patient's current anticoagulation dose is Coumadin 5 mg tabs: Use as directed.  The target INR is 2.0-3.0.  The next INR is due 10/24/2009.  Anticoagulation instructions were given to patient.  Results were reviewed/authorized by Loma Newton, PharmD.  She was notified by Loma Newton.         Prior Anticoagulation Instructions: INR 2.0  The patient is to continue with the same dose of coumadin.  This dosage includes: One tab daily (5mg ) and 1.5 tab (7.5 mg) on Mondays, Wednesdays, and Fridays.   Next appointment April 18th at 1:30 pm.   Recheck in 2 weeks.   Current Anticoagulation Instructions: INR = 1.5 (goal = 2-3)  The patient's dosage of coumadin will be increased.  The new dosage includes:  1 tablet friday, saturday, sunday and 1.5 tablets on monday, tuesday, wednesday, and thursday

## 2010-08-01 NOTE — Assessment & Plan Note (Signed)
Summary: chest pain   Visit Type:  Follow-up Referring Provider:  Dr. Johney Frame Primary Provider:  Dr Jimmy Picket  CC:  chest pain.  History of Present Illness: this is a 65 year-old woman with CAD and paroxysmal AF presenting for evaluation of chest pain. She was seen in the coumadin clinic and complained of chest pain in the center of her chest, feels like a 'pressure.' This has been nearly constant over several weeks and is worse when lying down at night. Symptoms are unrelated to exertion. Denies dyspnea, edema, lightheadedness, or syncope. This feels different than her symptoms at the time of PCI in October 2010.  She notes at that time she was taken off of her PPI and was started on pepcid. She feels like this is not as effective for her reflux.  Current Medications (verified): 1)  Lisinopril 10 Mg Tabs (Lisinopril) .[name].. 1 Tab Once Daily 2)  Lexapro 10 Mg Tabs (Escitalopram Oxalate) .[name].. 1 Tab Once Daily 3)  Amitriptyline Hcl 50 Mg Tabs (Amitriptyline Hcl) .[name].. 1 Tab Once Daily 4)  Glucophage 500 Mg Tabs (Metformin Hcl) .[name].. 1 Tab Once Daily 5)  Vitamin B12 Shot .... Q Monthly 6)  Aspirin 325 Mg  Tabs (Aspirin) .[name].. 1 Tab Once Daily (Pt Started Today 10.01.2010) 7)  Plavix 75 Mg Tabs (Clopidogrel Bisulfate) .... Take One Daily 8)  Crestor 40 Mg Tabs (Rosuvastatin Calcium) .... Take One Daily 9)  Nitroglycerin 0.4 Mg/hr Pt24 (Nitroglycerin) .... Take As Needed 10)  Pepcid 40 Mg Tabs (Famotidine) .... Take One Tablet By Mouth Once Daily 11)  Multaq 400 Mg Tabs (Dronedarone Hcl) .... Take 1 Tablet Twice A Day 12)  Coumadin 5 Mg Tabs (Warfarin Sodium) .... Use As Directed  Allergies (verified): No Known Drug Allergies  Past History:  Past medical history reviewed for relevance to current acute and chronic problems.  Past Medical History: Reviewed history from 05/11/2009 and no changes required. Arthritis DIABETES MELLITUS, TYPE II (ICD-250.00) SKIN CANCER, HX OF  (ICD-V10.83) HYPERLIPIDEMIA-MIXED (ICD-272.4) CAD, NATIVE VESSEL (ICD-414.01) s/p PCI RCA (mid) with a XIENCE stent 04/04/09 ANGINA, UNSTABLE (ICD-411.1) ESSENTIAL HYPERTENSION, BENIGN (ICD-401.1)    Review of Systems       Positive for headache, otherwise negative except as per HPI.  Vital Signs:  Patient profile:   65 year old female Height:      62 inches Weight:      164 pounds BMI:     30.10 Pulse rhythm:   regular Resp:     18 per minute BP sitting:   86 / 54  (left arm) Cuff size:   large  Vitals Entered By: Vikki Ports (August 08, 2009 2:48 PM)  Physical Exam  General:  Pt is alert and oriented, in no acute distress. HEENT: normal Neck: normal carotid upstrokes without bruits, JVP normal Lungs: CTA CV: RRR without murmur or gallop Abd: soft, NT, positive BS, no bruit, no organomegaly Ext: no clubbing, cyanosis, or edema. peripheral pulses 2+ and equal Skin: warm and dry without rash    EKG  Procedure date:  08/08/2009  Findings:      NSR< low-voltage QRS, otherwise within normal limits. HR 75 bpm  Impression & Recommendations:  Problem # 1:  CAD, NATIVE VESSEL (ICD-414.01) Pt's chest pain has typical and atypical characteristics, but is predominately atypical. I would favor changing her from pepcid to protonix for more effective treatment of GERD. Her symptoms are nocturnal and are not worsened by exertion. Also recommend decreasing ASA  to 81 mg to reduce long-termbleeding risk on triple drug Rx with coumadin, ASA, and plavix.  If continued symptoms after these med changes, she needs a Myoview stress test. She sees Dr Johney Frame next week and can revisit the issue then.  Her updated medication list for this problem includes:    Lisinopril 10 Mg Tabs (Lisinopril) .[name]... 1 tab once daily    Aspirin 81 Mg Tbec (Aspirin) .[name]... Take one tablet by mouth daily    Plavix 75 Mg Tabs (Clopidogrel bisulfate) .[name]... Take one daily    Nitroglycerin 0.4 Mg/hr Pt24  (Nitroglycerin) .[name]... Take as needed    Coumadin 5 Mg Tabs (Warfarin sodium) ..... Use as directed  Orders: EKG w/ Interpretation (93000)  Problem # 2:  ATRIAL FIBRILLATION (ICD-427.31) In NSR today. On Multaq and coumadin.  Her updated medication list for this problem includes:    Aspirin 81 Mg Tbec (Aspirin) .[name]... Take one tablet by mouth daily    Plavix 75 Mg Tabs (Clopidogrel bisulfate) .[name]... Take one daily    Multaq 400 Mg Tabs (Dronedarone hcl) .[name]... Take 1 tablet twice a day    Coumadin 5 Mg Tabs (Warfarin sodium) ..... Use as directed  Orders: EKG w/ Interpretation (93000)  Patient Instructions: 1)  Your physician has recommended you make the following change in your medication: DECREASE Aspirin to 81mg  once a day, STOP Pepcid, START Protonix 40mg  once a day 2)  Your physician recommends that you keep your scheduled follow-up appointment with Dr Johney Frame. Prescriptions: PROTONIX 40 MG TBEC (PANTOPRAZOLE SODIUM) take one tablet by mouth daily  #30 x 4   Entered by:   Julieta Gutting, RN, BSN   Authorized by:   Norva Karvonen, MD   Signed by:   Julieta Gutting, RN, BSN on 08/08/2009   Method used:   Electronically to        [id number] (retail)       [address]       [place], [place]  [address]       Ph: [phone] or [phone]       Fax: [phone]   RxID:   [id number]   Appended Document: chest pain Thanks so much Kathlene November.  We will discuss this further upon her next follow-up.

## 2010-08-01 NOTE — Medication Information (Signed)
Summary: rov/tm  Anticoagulant Therapy  Managed by: Weston Brass, PharmD Referring MD: Johney Frame MD, Fayrene Fearing PCP: Dr Jimmy Picket Supervising MD: Antoine Poche MD, Fayrene Fearing Indication 1: Atrial Fibrillation Lab Used: [facility] [place] Site: [address] INR POC 2.8 INR RANGE 2.0-3.0  Dietary changes: no    Health status changes: no    Bleeding/hemorrhagic complications: yes       Details: still bruising but not anything out of the ordinary   Recent/future hospitalizations: no    Any changes in medication regimen? no    Recent/future dental: no  Any missed doses?: no       Is patient compliant with meds? yes       Allergies: No Known Drug Allergies  Anticoagulation Management History:      The patient is taking warfarin and comes in today for a routine follow up visit.  Positive risk factors for bleeding include an age of 67 years or older and presence of serious comorbidities.  The bleeding index is 'intermediate risk'.  Positive CHADS2 values include History of HTN and History of Diabetes.  Negative CHADS2 values include Age > 58 years old.  Her last INR was 1.9.  Anticoagulation responsible provider: Antoine Poche MD, Fayrene Fearing.  INR POC: 2.8.  Cuvette Lot#: [id number].  Exp: 01/2011.    Anticoagulation Management Assessment/Plan:      The patient's current anticoagulation dose is Coumadin 5 mg tabs: Use as directed.  The target INR is 2.0-3.0.  The next INR is due 12/02/2009.  Anticoagulation instructions were given to patient.  Results were reviewed/authorized by Weston Brass, PharmD.  She was notified by Weston Brass PharmD.         Prior Anticoagulation Instructions: INR 1.8 Today take 2 pill today then change dose to 1.5 pills everyday except on Sundays and Thursdays take 1 pill.   Current Anticoagulation Instructions: INR 2.8  Continue same dose of 1 1/2 tablets every day except 1 tablet on Sunday and Thursday

## 2010-08-01 NOTE — Medication Information (Signed)
Summary: rov/eac  Anticoagulant Therapy  Managed by: Shelby Dubin, PharmD, BCPS, CPP Referring MD: Johney Frame MD, Fayrene Fearing PCP: Dr Jimmy Picket Supervising MD: Shirlee Latch MD, Freida Busman Indication 1: Atrial Fibrillation Lab Used: [facility] [place] Site: [address] INR POC 1.5 INR RANGE 2.0-3.0  Dietary changes: no    Health status changes: no    Bleeding/hemorrhagic complications: no    Recent/future hospitalizations: no    Any changes in medication regimen? yes       Details: cipro for 5 days for UTI  Recent/future dental: no  Any missed doses?: no       Is patient compliant with meds? yes       Allergies (verified): No Known Drug Allergies  Anticoagulation Management History:      The patient is taking warfarin and comes in today for a routine follow up visit.  Positive risk factors for bleeding include an age of 67 years or older and presence of serious comorbidities.  The bleeding index is 'intermediate risk'.  Positive CHADS2 values include History of HTN and History of Diabetes.  Negative CHADS2 values include Age > 46 years old.  Anticoagulation responsible provider: Shirlee Latch MD, [place].  INR POC: 1.5.  Cuvette Lot#: [id number].  Exp: 10/2010.    Anticoagulation Management Assessment/Plan:      The patient's current anticoagulation dose is Coumadin 5 mg tabs: Use as directed.  The next INR is due 09/26/2009.  Anticoagulation instructions were given to patient.  Results were reviewed/authorized by Shelby Dubin, PharmD, BCPS, CPP.  She was notified by Shelby Dubin PharmD, BCPS, CPP.         Prior Anticoagulation Instructions: INR 1.6  Take 1.5 tablets today.  Then return to normal dosing schedule. Take 1 tablet every day.  Return to clinic in 2 weeks.    Current Anticoagulation Instructions: INR 1.5  Take 2 tabs today, then increase to 1.5 tabs each Monday and Thursday and 1 tab on all other days.  Recheck in 10 days.

## 2010-08-01 NOTE — Assessment & Plan Note (Signed)
Summary: sweelin in hands and feet up to knee/kl   Visit Type:  Follow-up Referring Provider:  Dr. Johney Frame Primary Provider:  Dr Jimmy Picket  CC:  edema.  History of Present Illness: The patient is 65 years old and returned today for an unscheduled visit because of swelling in her lower extremities and hands. She has coronary disease and had a double-J stent placed by me in October of 2010. She also has paroxysmal atrial fibrillation and has been managed with Coumadin and Multaq.  She was recently hospitalized for chest pain and her creatinine was 1.5 on admission and her lisinopril/hydrochlorothiazide was discontinued and she was discharged on metoprolol instead. Since discharge she developed swelling in her lower extremities and some in her hands he came in today for evaluation.  She has had no recurrent chest pain or palpitations. She is off of the Coumadin short-term because of easy bruising.  Current Medications (verified): 1)  Amitriptyline Hcl 50 Mg Tabs (Amitriptyline Hcl) .[name].. 1 Tab Once Daily 2)  Glucophage 500 Mg Tabs (Metformin Hcl) .[name].. 1 Tab Once Daily 3)  Vitamin B12 Shot .... Q Monthly 4)  Aspirin 81 Mg Tbec (Aspirin) .... Take One Tablet By Mouth Daily 5)  Plavix 75 Mg Tabs (Clopidogrel Bisulfate) .... Take One Daily 6)  Crestor 40 Mg Tabs (Rosuvastatin Calcium) .... Take One Daily 7)  Nitrostat 0.4 Mg Subl (Nitroglycerin) .[name].. 1 Tablet Under Tongue At Onset of Chest Pain; You May Repeat Every 5 Minutes For Up To 3 Doses. 8)  Protonix 40 Mg Tbec (Pantoprazole Sodium) .... Take One Tablet By Mouth Daily 9)  Multaq 400 Mg Tabs (Dronedarone Hcl) .... Take 1 Tablet Twice A Day 10)  Coumadin 5 Mg Tabs (Warfarin Sodium) .... Use As Directed 11)  Colace 100 Mg Caps (Docusate Sodium) .[name].. 1 Tab Two Times A Day 12)  Ferrous Sulfate 325 (65 Fe) Mg Tabs (Ferrous Sulfate) .[name].. 1 Tab Two Times A Day 13)  Metoprolol Tartrate 25 Mg Tabs (Metoprolol Tartrate) .... 1/2 Tab  Bid  Allergies (verified): No Known Drug Allergies  Past History:  Past Medical History: Reviewed history from 08/17/2009 and no changes required. Arthritis DIABETES MELLITUS, TYPE II (ICD-250.00) SKIN CANCER, HX OF (ICD-V10.83) HYPERLIPIDEMIA-MIXED (ICD-272.4) CAD, NATIVE VESSEL (ICD-414.01) s/p PCI RCA (mid) with a XIENCE stent 04/04/09 Paroxysmal atrial fibrillation ESSENTIAL HYPERTENSION, BENIGN (ICD-401.1)    Review of Systems       ROS is negative except as outlined in HPI.   Vital Signs:  Patient profile:   65 year old female Height:      62 inches Weight:      168 pounds Pulse rate:   62 / minute BP sitting:   128 / 80  (left arm) Cuff size:   regular  Vitals Entered By: Burnett Kanaris, CNA (December 16, 2009 10:33 AM)  Physical Exam  Additional Exam:  Gen. Well-nourished, in no distress   Neck: No JVD, thyroid not enlarged, no carotid bruits Lungs: No tachypnea, clear without rales, rhonchi or wheezes Cardiovascular: Rhythm regular, PMI not displaced,  heart sounds  normal, no murmurs or gallops, 1+ bilateral lower extremity peripheral edema, pulses normal in all 4 extremities. Abdomen: BS normal, abdomen soft and non-tender without masses or organomegaly, no hepatosplenomegaly. MS: No deformities, no cyanosis or clubbing   Neuro:  No focal sns   Skin:  no lesions    Impression & Recommendations:  Problem # 1:  EDEMA (ICD-782.3)  She has developed lower extremity edema since  her lisinopril/hydrochlorothiazide was stopped in the hospital. She has no JVD and I think this is related to venous insufficiency. We will resume her lisinopril/hydrochlorothiazide and get a BMP today and another one next week. As long as her creatinine is below 1.5 I think we can continue the drug.  She was on lisinopril/hydrochlorothiazide 20/12.5. We will put her on lisinopril 10 mg and hydrochlorothiazide 25 mg in separate prescriptions.  Orders: TLB-BMP (Basic Metabolic Panel-BMET)  (80048-METABOL)  Problem # 2:  CAD, NATIVE VESSEL (ICD-414.01)  She had a stent placed in the right coronary in October 2010. She's had no chest pain this prompted stable.   The following medications were removed from the medication list:    Lisinopril 10 Mg Tabs (Lisinopril) .[name]... 1 tab once daily Her updated medication list for this problem includes:    Aspirin 81 Mg Tbec (Aspirin) .[name]... Take one tablet by mouth daily    Plavix 75 Mg Tabs (Clopidogrel bisulfate) .[name]... Take one daily    Nitrostat 0.4 Mg Subl (Nitroglycerin) .[name]... 1 tablet under tongue at onset of chest pain; you may repeat every 5 minutes for up to 3 doses.    Coumadin 5 Mg Tabs (Warfarin sodium) ..... Use as directed    Metoprolol Tartrate 25 Mg Tabs (Metoprolol tartrate) .[name]... 1/2 tab bid    Lisinopril 10 Mg Tabs (Lisinopril) .[name]... Take one tablet by mouth daily  The following medications were removed from the medication list:    Lisinopril 10 Mg Tabs (Lisinopril) .[name]... 1 tab once daily Her updated medication list for this problem includes:    Aspirin 81 Mg Tbec (Aspirin) .[name]... Take one tablet by mouth daily    Plavix 75 Mg Tabs (Clopidogrel bisulfate) .[name]... Take one daily    Nitrostat 0.4 Mg Subl (Nitroglycerin) .[name]... 1 tablet under tongue at onset of chest pain; you may repeat every 5 minutes for up to 3 doses.    Coumadin 5 Mg Tabs (Warfarin sodium) ..... Use as directed    Metoprolol Tartrate 25 Mg Tabs (Metoprolol tartrate) .[name]... 1/2 tab bid  Problem # 3:  ESSENTIAL HYPERTENSION, BENIGN (ICD-401.1)  This is well controlled on current medications and we are making some adjustments. The following medications were removed from the medication list:    Lisinopril 10 Mg Tabs (Lisinopril) .[name]... 1 tab once daily Her updated medication list for this problem includes:    Aspirin 81 Mg Tbec (Aspirin) .[name]... Take one tablet by mouth daily    Metoprolol Tartrate 25 Mg Tabs (Metoprolol tartrate) .[name]... 1/2 tab bid     Lisinopril 10 Mg Tabs (Lisinopril) .[name]... Take one tablet by mouth daily    Hydrochlorothiazide 25 Mg Tabs (Hydrochlorothiazide) .[name]... Take one tablet by mouth daily.  The following medications were removed from the medication list:    Lisinopril 10 Mg Tabs (Lisinopril) .[name]... 1 tab once daily Her updated medication list for this problem includes:    Aspirin 81 Mg Tbec (Aspirin) .[name]... Take one tablet by mouth daily    Metoprolol Tartrate 25 Mg Tabs (Metoprolol tartrate) .[name]... 1/2 tab bid    Lisinopril 10 Mg Tabs (Lisinopril) .[name]... Take one tablet by mouth daily  Orders: TLB-BMP (Basic Metabolic Panel-BMET) (80048-METABOL)  Problem # 4:  ATRIAL FIBRILLATION (ICD-427.31)  She has paroxysmal atrial fibrillation. She is currently in sinus rhythm. Her Coumadin is on hold pending her next visit with Dr. Johney Frame. Her updated medication list for this problem includes:    Aspirin 81 Mg Tbec (Aspirin) .[name]... Take  one tablet by mouth daily    Plavix 75 Mg Tabs (Clopidogrel bisulfate) .[name]... Take one daily    Multaq 400 Mg Tabs (Dronedarone hcl) .[name]... Take 1 tablet twice a day    Coumadin 5 Mg Tabs (Warfarin sodium) ..... Use as directed    Metoprolol Tartrate 25 Mg Tabs (Metoprolol tartrate) .[name]... 1/2 tab bid  Orders: EKG w/ Interpretation (93000)  Her updated medication list for this problem includes:    Aspirin 81 Mg Tbec (Aspirin) .[name]... Take one tablet by mouth daily    Plavix 75 Mg Tabs (Clopidogrel bisulfate) .[name]... Take one daily    Multaq 400 Mg Tabs (Dronedarone hcl) .[name]... Take 1 tablet twice a day    Coumadin 5 Mg Tabs (Warfarin sodium) ..... Use as directed    Metoprolol Tartrate 25 Mg Tabs (Metoprolol tartrate) .[name]... 1/2 tab bid  Patient Instructions: 1)  Your physician recommends that you schedule a follow-up appointment in: in a few weeks by Dr Johney Frame 2)  Your physician recommends that you return for lab work in: BMP today and in a week. 414.01,782.3 3)  Your physician has recommended  you make the following change in your medication: Lisinopril 10 mg take one tablet daily and HCTZ 25 mg take one tablet daily. Prescriptions: HYDROCHLOROTHIAZIDE 25 MG TABS (HYDROCHLOROTHIAZIDE) Take one tablet by mouth daily.  #30 x 8   Entered by:   Ollen Gross, RN, BSN   Authorized by:   Lenoria Farrier, MD, [facility]   Signed by:   Ollen Gross, RN, BSN on 12/16/2009   Method used:   Electronically to        [id number] (retail)       [address]       [place], [place]  [address]       Ph: [phone] or [phone]       Fax: [phone]   RxID:   [id number] LISINOPRIL 10 MG TABS (LISINOPRIL) Take one tablet by mouth daily  #30 x 8   Entered by:   Ollen Gross, RN, BSN   Authorized by:   Lenoria Farrier, MD, [facility]   Signed by:   Ollen Gross, RN, BSN on 12/16/2009   Method used:   Electronically to        [id number] (retail)       [address]       [place], [place]  [address]       Ph: [phone] or [phone]       Fax: [phone]   RxID:   [id number]

## 2010-08-01 NOTE — Medication Information (Signed)
Summary: rov/tm  Anticoagulant Therapy  Managed by: Cloyde Reams, RN, BSN Referring MD: Johney Frame MD, Fayrene Fearing PCP: Dr Jimmy Picket Supervising MD: Johney Frame MD, Fayrene Fearing Indication 1: Atrial Fibrillation Lab Used: [facility] [place] Site: [address] INR POC 2.9 INR RANGE 2.0-2.5  Dietary changes: no    Health status changes: no    Bleeding/hemorrhagic complications: no    Recent/future hospitalizations: no    Any changes in medication regimen? no    Recent/future dental: no  Any missed doses?: no       Is patient compliant with meds? yes       Allergies: No Known Drug Allergies  Anticoagulation Management History:      The patient is taking warfarin and comes in today for a routine follow up visit.  Positive risk factors for bleeding include an age of 65 years or older and presence of serious comorbidities.  The bleeding index is 'intermediate risk'.  Positive CHADS2 values include History of HTN and History of Diabetes.  Negative CHADS2 values include Age > 68 years old.  Her last INR was 1.9.  Anticoagulation responsible provider: Mikaele Stecher MD, Fayrene Fearing.  INR POC: 2.9.  Cuvette Lot#: [id number].  Exp: 05/2011.    Anticoagulation Management Assessment/Plan:      The patient's current anticoagulation dose is Coumadin 2.5 mg tabs: as directed by coumadin clinic.  The target INR is 2.0-3.0.  The next INR is due 05/08/2010.  Anticoagulation instructions were given to patient.  Results were reviewed/authorized by Cloyde Reams, RN, BSN.  She was notified by Cloyde Reams RN.         Prior Anticoagulation Instructions: INR 2.4 Continue 2 pills everyday except 2 1/2 pills on Tuesdays, Thursdays and Sundays. Recheck in 1week.  Current Anticoagulation Instructions: INR 2.9  Continue on same dosage 2 tablets daily except 2.5 tablets on Sundays, Tuesdays, and Thursdays.  Recheck in 2 weeks.

## 2010-08-01 NOTE — Letter (Signed)
Summary: [employer] for [employer] Application  Handic[employer] for [employer] Application   Imported By: Roderic Ovens 10/27/2009 16:40:51  _____________________________________________________________________  External Attachment:    Type:   Image     Comment:   External Document

## 2010-08-01 NOTE — Medication Information (Signed)
Summary: rov/mb  Anticoagulant Therapy  Managed by: Lynann Bologna, PharmD Referring MD: Johney Frame MD, Fayrene Fearing PCP: Dr Jimmy Picket Supervising MD: Clifton James MD, Cristal Deer Indication 1: Atrial Fibrillation Lab Used: [facility] [place] Site: [address] INR POC 2 INR RANGE 2.0-3.0  Dietary changes: no    Health status changes: no    Bleeding/hemorrhagic complications: no    Recent/future hospitalizations: no    Any changes in medication regimen? no    Recent/future dental: no  Any missed doses?: no       Is patient compliant with meds? yes       Current Medications (verified): 1)  Lisinopril 10 Mg Tabs (Lisinopril) .[name].. 1 Tab Once Daily 2)  Lexapro 10 Mg Tabs (Escitalopram Oxalate) .[name].. 1 Tab Once Daily 3)  Amitriptyline Hcl 50 Mg Tabs (Amitriptyline Hcl) .[name].. 1 Tab Once Daily 4)  Glucophage 500 Mg Tabs (Metformin Hcl) .[name].. 1 Tab Once Daily 5)  Vitamin B12 Shot .... Q Monthly 6)  Aspirin 81 Mg Tbec (Aspirin) .... Take One Tablet By Mouth Daily 7)  Plavix 75 Mg Tabs (Clopidogrel Bisulfate) .... Take One Daily 8)  Crestor 40 Mg Tabs (Rosuvastatin Calcium) .... Take One Daily 9)  Nitroglycerin 0.4 Mg/hr Pt24 (Nitroglycerin) .... Take As Needed 10)  Protonix 40 Mg Tbec (Pantoprazole Sodium) .... Take One Tablet By Mouth Daily 11)  Multaq 400 Mg Tabs (Dronedarone Hcl) .... Take 1 Tablet Twice A Day 12)  Coumadin 5 Mg Tabs (Warfarin Sodium) .... Use As Directed  Allergies (verified): No Known Drug Allergies  Anticoagulation Management History:      The patient is taking warfarin and comes in today for a routine follow up visit.  Positive risk factors for bleeding include an age of 65 years or older and presence of serious comorbidities.  The bleeding index is 'intermediate risk'.  Positive CHADS2 values include History of HTN and History of Diabetes.  Negative CHADS2 values include Age > 15 years old.  Her last INR was 1.9.  Anticoagulation responsible  provider: Clifton James MD, Cristal Deer.  INR POC: 2.  Cuvette Lot#: [id number].  Exp: 10/2010.    Anticoagulation Management Assessment/Plan:      The patient's current anticoagulation dose is Coumadin 5 mg tabs: Use as directed.  The target INR is 2.0-3.0.  The next INR is due 10/17/2009.  Anticoagulation instructions were given to patient.  Results were reviewed/authorized by Lynann Bologna, PharmD.  She was notified by Lynann Bologna.         Prior Anticoagulation Instructions: INR = 1.9 (goal = 2.3)  The patient's dosage of coumadin will be increased.  The new dosage includes: 1 tablet on all days except for mon, wed, friday take 1.5 tablets  Current Anticoagulation Instructions: INR 2.0  The patient is to continue with the same dose of coumadin.  This dosage includes: One tab daily (5mg ) and 1.5 tab (7.5 mg) on Mondays, Wednesdays, and Fridays.   Next appointment April 18th at 1:30 pm.   Recheck in 2 weeks.

## 2010-08-01 NOTE — Progress Notes (Signed)
Summary: REFILL  Phone Note Refill Request Message from:  Patient on August 10, 2009 2:59 PM  Refills Requested: Medication #1:  MULTAQ 400 MG TABS Take 1 tablet twice a day [facility] Sharlett Iles [phone] PT NEED  30DAY SUPPLY AND NEED ANOTHER PRESCRIPTION FAX TO [facility]  Initial call taken by: Judie Grieve,  August 10, 2009 2:59 PM    Prescriptions: MULTAQ 400 MG TABS (DRONEDARONE HCL) Take 1 tablet twice a day  #180 x 3   Entered by:   Kem Parkinson   Authorized by:   Hillis Range, MD   Signed by:   Kem Parkinson on 08/10/2009   Method used:   Faxed to ...       [facility] Environmental education officer)       [address] [address]       [place], [place]  [address]       Ph: [phone]       Fax: [phone]   RxID:   [id number]

## 2010-08-01 NOTE — Medication Information (Signed)
Summary: [name]  Anticoagulant Therapy  Managed by: Shelby Dubin, PharmD, BCPS, CPP Referring MD: Johney Frame MD, Fayrene Fearing PCP: Dr Jimmy Picket Supervising MD: Shirlee Latch MD, Freida Busman Indication 1: Atrial Fibrillation Lab Used: [facility] [place] Site: [address] INR POC 2.6 INR RANGE 2.0-3.0  Dietary changes: no    Health status changes: no    Bleeding/hemorrhagic complications: no    Recent/future hospitalizations: no    Any changes in medication regimen? no    Recent/future dental: no  Any missed doses?: no       Is patient compliant with meds? yes       Allergies (verified): No Known Drug Allergies  Anticoagulation Management History:      The patient is taking warfarin and comes in today for a routine follow up visit.  Positive risk factors for bleeding include an age of 67 years or older and presence of serious comorbidities.  The bleeding index is 'intermediate risk'.  Positive CHADS2 values include History of HTN and History of Diabetes.  Negative CHADS2 values include Age > 67 years old.  Anticoagulation responsible provider: Shirlee Latch MD, Ausha Sieh.  INR POC: 2.6.  Cuvette Lot#: [id number].  Exp: 10/2010.    Anticoagulation Management Assessment/Plan:      The next INR is due 08/08/2009.  Anticoagulation instructions were given to patient.  Results were reviewed/authorized by Shelby Dubin, PharmD, BCPS, CPP.  She was notified by Shelby Dubin PharmD, BCPS, CPP.         Prior Anticoagulation Instructions: INR 1.4 Today take 7.5mg s then resume 5mg s daily. Recheck on Friday or Monday per Doretha Imus.   Current Anticoagulation Instructions: INR 2.6  Continue taking 1 tab daily.  Recheck in 1 week.

## 2010-08-01 NOTE — Miscellaneous (Signed)
Summary: [facility] Physician Response to Notification of Patient Event  [facility] Physician Response to Notification of Patient Event   Imported By: Roderic Ovens 08/03/2009 14:58:56  _____________________________________________________________________  External Attachment:    Type:   Image     Comment:   External Document

## 2010-08-01 NOTE — Letter (Signed)
Summary: Generic Letter  Architectural technologist, Main Office  [address]   [place], [place] [address]   Phone: [phone]  Fax: [phone]        December 27, 2009 MRN: [id number]    [facility] [address] [place], [place]  [address]    Dear Ms. Nichols,  This is to inform you that the repeat labwork you had done in our office was normal. This was to recheck your electrolyte panel (looking at your potassium/kidney function/sodium levels). If you have any questions, please call our office.        Sincerely,  Sherri Rad, RN, BSN  This letter has been electronically signed by your physician.

## 2010-08-01 NOTE — Assessment & Plan Note (Signed)
Summary: PER CHECK OUT/SF   Visit Type:  rov Referring Provider:  Dr. Johney Frame Primary Provider:  Dr Jimmy Picket  CC:  chest pain..sob.Marland Kitchendenies any edema.  History of Present Illness: The patient presents today for routine cardiology followup. She reports doing well since last being seen in our clinic.  She was seen by Dr Excell Seltzer recently for atypical chest pain.  She reports having sharp stabbing chest pain, lasting only several seconds.  She denies exertional chest pain or SOB.  She remains active and is able to ambulate throughout the mall without significant SOB or CP.  The patient denies symptoms of palpitations, shortness of breath, orthopnea, PND, lower extremity edema, dizziness, presyncope, syncope, or neurologic sequela. The patient is tolerating medications without difficulties and is otherwise without complaint today.   Allergies (verified): No Known Drug Allergies  Past History:  Past Medical History: Arthritis DIABETES MELLITUS, TYPE II (ICD-250.00) SKIN CANCER, HX OF (ICD-V10.83) HYPERLIPIDEMIA-MIXED (ICD-272.4) CAD, NATIVE VESSEL (ICD-414.01) s/p PCI RCA (mid) with a XIENCE stent 04/04/09 Paroxysmal atrial fibrillation ESSENTIAL HYPERTENSION, BENIGN (ICD-401.1)    Past Surgical History: Reviewed history from 04/27/2009 and no changes required. TAH Appendectomy Cholecystectomy Spinal cyst removed with three surgeries required Scalp BCC removed Hysterectomy Stent place 04-04-2009  Social History: Reviewed history from 04/01/2009 and no changes required. Pt lives in [place] with husband.  Retired from [employer].  Tob- quit 1991 (previously 1PPD x 20 years).  ETOH- rare.  Drugs- denies  Review of Systems       All systems are reviewed and negative except as listed in the HPI.   Vital Signs:  Patient profile:   65 year old female Height:      62 inches Weight:      164 pounds BMI:     30.10 Pulse rate:   70 / minute Pulse rhythm:   irregular BP sitting:    130 / 60  (left arm) Cuff size:   regular  Vitals Entered By: Danielle Rankin, CMA (August 17, 2009 2:16 PM)  Physical Exam  General:  Well developed, well nourished, in no acute distress. Head:  normocephalic and atraumatic Eyes:  PERRLA/EOM intact; conjunctiva and lids normal. Nose:  no deformity, discharge, inflammation, or lesions Mouth:  Teeth, gums and palate normal. Oral mucosa normal. Neck:  Neck supple, no JVD. No masses, thyromegaly or abnormal cervical nodes. Lungs:  Clear bilaterally to auscultation and percussion. Heart:  Non-displaced PMI, chest non-tender; regular rate and rhythm, S1, S2 without murmurs, rubs or gallops. Carotid upstroke normal, no bruit. Normal abdominal aortic size, no bruits. Femorals normal pulses, no bruits. Pedals normal pulses. No edema, no varicosities. Abdomen:  Bowel sounds positive; abdomen soft and non-tender without masses, organomegaly, or hernias noted. No hepatosplenomegaly. Msk:  Back normal, normal gait. Muscle strength and tone normal. Pulses:  pulses normal in all 4 extremities Extremities:  No clubbing or cyanosis. Neurologic:  Alert and oriented x 3. Skin:  Intact without lesions or rashes. Cervical Nodes:  no significant adenopathy Psych:  Normal affect.   EKG  Procedure date:  08/17/2009  Findings:      sinus rhythm 70 bpm, PR 200, QT 455, otherwise normal ekg  Impression & Recommendations:  Problem # 1:  ATRIAL FIBRILLATION (ICD-427.31) Maintaining sinus rhythm with multaq.  On coumadin for stroke prevention.  Her updated medication list for this problem includes:    Aspirin 81 Mg Tbec (Aspirin) .[name]... Take one tablet by mouth daily    Plavix 75 Mg Tabs (Clopidogrel  bisulfate) .[name]... Take one daily    Multaq 400 Mg Tabs (Dronedarone hcl) .[name]... Take 1 tablet twice a day    Coumadin 5 Mg Tabs (Warfarin sodium) ..... Use as directed  Problem # 2:  CAD, NATIVE VESSEL (ICD-414.01)  Stable without symptoms of ischemia. She  has recently had atypical CP. Continue medical management.  Her updated medication list for this problem includes:    Lisinopril 10 Mg Tabs (Lisinopril) .[name]... 1 tab once daily    Aspirin 81 Mg Tbec (Aspirin) .[name]... Take one tablet by mouth daily    Plavix 75 Mg Tabs (Clopidogrel bisulfate) .[name]... Take one daily    Nitroglycerin 0.4 Mg/hr Pt24 (Nitroglycerin) .[name]... Take as needed    Coumadin 5 Mg Tabs (Warfarin sodium) ..... Use as directed  Her updated medication list for this problem includes:    Lisinopril 10 Mg Tabs (Lisinopril) .[name]... 1 tab once daily    Aspirin 81 Mg Tbec (Aspirin) .[name]... Take one tablet by mouth daily    Plavix 75 Mg Tabs (Clopidogrel bisulfate) .[name]... Take one daily    Nitroglycerin 0.4 Mg/hr Pt24 (Nitroglycerin) .[name]... Take as needed    Coumadin 5 Mg Tabs (Warfarin sodium) ..... Use as directed  Problem # 3:  ESSENTIAL HYPERTENSION, BENIGN (ICD-401.1)  stable, no changes today  Her updated medication list for this problem includes:    Lisinopril 10 Mg Tabs (Lisinopril) .[name]... 1 tab once daily    Aspirin 81 Mg Tbec (Aspirin) .[name]... Take one tablet by mouth daily  Problem # 4:  HYPERLIPIDEMIA-MIXED (ICD-272.4) stable Her updated medication list for this problem includes:    Crestor 40 Mg Tabs (Rosuvastatin calcium) .[name]... Take one daily  Patient Instructions: 1)  Your physician recommends that you schedule a follow-up appointment in: 6 months with Dr Johney Frame

## 2010-08-01 NOTE — Miscellaneous (Signed)
Summary: [facility] Cardiac Progress Note  [facility]iac Progress Note   Imported By: Roderic Ovens 08/11/2009 13:07:29  _____________________________________________________________________  External Attachment:    Type:   Image     Comment:   External Document

## 2010-08-01 NOTE — Letter (Signed)
Summary: [facility] Prescription Authorization  [facility] Prescription Authorization   Imported By: Roderic Ovens 10/28/2009 12:32:43  _____________________________________________________________________  External Attachment:    Type:   Image     Comment:   External Document  Appended Document: [facility] Prescription Authorization ok

## 2010-08-01 NOTE — Letter (Signed)
Summary: Handout Printed  Printed Handout:  - Coumadin Instructions-w/out Meds 

## 2010-08-01 NOTE — Medication Information (Signed)
Summary: ROV/LB  Anticoagulant Therapy  Managed by: Eda Keys, PharmD Referring MD: Johney Frame MD, Fayrene Fearing PCP: Dr Jimmy Picket Supervising MD: Ladona Ridgel MD, Sharlot Gowda Indication 1: Atrial Fibrillation Lab Used: [facility] [place] Site: [address] INR POC 1.6 INR RANGE 2.0-3.0  Dietary changes: no    Health status changes: no    Bleeding/hemorrhagic complications: no    Recent/future hospitalizations: no    Any changes in medication regimen? no    Recent/future dental: no  Any missed doses?: no       Is patient compliant with meds? yes       Allergies: No Known Drug Allergies  Anticoagulation Management History:      The patient is taking warfarin and comes in today for a routine follow up visit.  Positive risk factors for bleeding include an age of 65 years or older and presence of serious comorbidities.  The bleeding index is 'intermediate risk'.  Positive CHADS2 values include History of HTN and History of Diabetes.  Negative CHADS2 values include Age > 30 years old.  Anticoagulation responsible provider: Ladona Ridgel MD, Sharlot Gowda.  INR POC: 1.6.  Cuvette Lot#: [id number].  Exp: 10/2010.    Anticoagulation Management Assessment/Plan:      The patient's current anticoagulation dose is Coumadin 5 mg tabs: Use as directed.  The next INR is due 09/19/2009.  Anticoagulation instructions were given to patient.  Results were reviewed/authorized by Eda Keys, PharmD.  She was notified by Eda Keys.         Prior Anticoagulation Instructions: INR 2.9  CONTINUE TO TAKE 1 TABLET EVERYDAY. RECHECK IN 4 WEEKS.  Current Anticoagulation Instructions: INR 1.6  Take 1.5 tablets today.  Then return to normal dosing schedule. Take 1 tablet every day.  Return to clinic in 2 weeks.

## 2010-08-01 NOTE — Miscellaneous (Signed)
Summary: [facility] Cardiac Progress Note  [facility] Cardiac Progress Note   Imported By: Roderic Ovens 07/21/2009 13:59:13  _____________________________________________________________________  External Attachment:    Type:   Image     Comment:   External Document

## 2010-08-01 NOTE — Medication Information (Signed)
Summary: rov mb  Anticoagulant Therapy  Managed by: Bethena Midget, RN, BSN Referring MD: Johney Frame MD, Fayrene Fearing PCP: Dr Jimmy Picket Supervising MD: Excell Seltzer MD, Casimiro Needle Indication 1: Atrial Fibrillation Lab Used: [facility] [place] Site: [address] INR POC 1.8 INR RANGE 2.0-3.0  Dietary changes: no    Health status changes: no    Bleeding/hemorrhagic complications: yes       Details: Bruise on left upper arm noted with harden knot in center  Recent/future hospitalizations: no    Any changes in medication regimen? no    Recent/future dental: no  Any missed doses?: no       Is patient compliant with meds? yes       Allergies: No Known Drug Allergies  Anticoagulation Management History:      The patient is taking warfarin and comes in today for a routine follow up visit.  Positive risk factors for bleeding include an age of 65 years or older and presence of serious comorbidities.  The bleeding index is 'intermediate risk'.  Positive CHADS2 values include History of HTN and History of Diabetes.  Negative CHADS2 values include Age > 67 years old.  Her last INR was 1.9.  Anticoagulation responsible provider: Excell Seltzer MD, Casimiro Needle.  INR POC: 1.8.  Cuvette Lot#: [id number].  Exp: 12/2010.    Anticoagulation Management Assessment/Plan:      The patient's current anticoagulation dose is Coumadin 5 mg tabs: Use as directed.  The target INR is 2.0-3.0.  The next INR is due 11/11/2009.  Anticoagulation instructions were given to patient.  Results were reviewed/authorized by Bethena Midget, RN, BSN.  She was notified by Bethena Midget, RN, BSN.         Prior Anticoagulation Instructions: INR = 1.5 (goal = 2-3)  The patient's dosage of coumadin will be increased.  The new dosage includes:  1 tablet friday, saturday, sunday and 1.5 tablets on monday, tuesday, wednesday, and thursday  Current Anticoagulation Instructions: INR 1.8 Today take 2 pill today then change dose to 1.5 pills  everyday except on Sundays and Thursdays take 1 pill.

## 2010-08-01 NOTE — Medication Information (Signed)
Summary: f/u coumadin per allred  Anticoagulant Therapy  Managed by: Weston Brass, PharmD Referring MD: Johney Frame MD, Fayrene Fearing PCP: Dr Jimmy Picket Supervising MD: Antoine Poche MD, Fayrene Fearing Indication 1: Atrial Fibrillation Lab Used: [facility] [place] Site: [address] INR POC 1.1 INR RANGE 2.0-2.5  Dietary changes: no            Allergies: No Known Drug Allergies  Anticoagulation Management History:      The patient is taking warfarin and comes in today for a routine follow up visit.  Positive risk factors for bleeding include an age of 65 years or older and presence of serious comorbidities.  The bleeding index is 'intermediate risk'.  Positive CHADS2 values include History of HTN and History of Diabetes.  Negative CHADS2 values include Age > 37 years old.  Her last INR was 1.9.  Anticoagulation responsible Ashutosh Dieguez: Antoine Poche MD, Fayrene Fearing.  INR POC: 1.1.  Cuvette Lot#: [id number].  Exp: 05/2011.    Anticoagulation Management Assessment/Plan:      The patient's current anticoagulation dose is Coumadin 2.5 mg tabs: as directed by coumadin clinic.  The target INR is 2.0-3.0.  The next INR is due 04/17/2010.  Anticoagulation instructions were given to patient.  Results were reviewed/authorized by Weston Brass, PharmD.  She was notified by Ilean Skill D candidate.         Prior Anticoagulation Instructions: INR 3.8. Hold Coumadin until Monday night. On Monday, start taking 1 tablet daily except 1.5 tablets on Mon, Wed, Fri.  Recheck in 1 week.   If you notice any knots or increasing pain in the stomach or back, go to ER.   Current Anticoagulation Instructions: INR 1.1  Take 2 tablets everyday except 2 1/2 tablets on Sunday, Tuesday, and Thursday. Recheck in 1 week.

## 2010-08-01 NOTE — Progress Notes (Signed)
Summary: Off Coumadin for colonoscopy  ---- Converted from flag ---- ---- 05/29/2010 6:31 PM, Hillis Range, MD wrote: ok to stop coumadin for 5 days prior to the procedure and restart immediately after.  ---- 05/29/2010 4:29 PM, Lyna Poser PharmD wrote: patient Sharen Youngren is having colonscopy on 14th. Is it ok to go off coumadin? ------------------------------  Phone Note Outgoing Call   Call placed by: Weston Brass PharmD,  May 30, 2010 11:20 AM Call placed to: Patient Summary of Call: Spoke with pt.  She is aware okay to stop Coumadin.  Will take last dose of Coumadin on 12/9.  Instructed pt to restart when okay with MD.  Take extra 1/2 tablet the first day she starts back then resume normal dose.  Appt made to recheck INR 1 week post procedure.  Initial call taken by: Weston Brass PharmD,  May 30, 2010 11:21 AM

## 2010-08-03 NOTE — Medication Information (Signed)
Summary: rov/mw  Anticoagulant Therapy  Managed by: Weston Brass, PharmD Referring MD: Johney Frame MD, Fayrene Fearing PCP: Dr Jimmy Picket Supervising MD: Shirlee Latch MD, Dalton Indication 1: Atrial Fibrillation Lab Used: [facility] [place] Site: [address] INR POC 1.7 INR RANGE 2.0-2.5  Dietary changes: no    Health status changes: no    Bleeding/hemorrhagic complications: no    Recent/future hospitalizations: no    Any changes in medication regimen? no    Recent/future dental: no  Any missed doses?: no       Is patient compliant with meds? yes       Allergies: No Known Drug Allergies  Anticoagulation Management History:      The patient is taking warfarin and comes in today for a routine follow up visit.  Positive risk factors for bleeding include an age of 65 years or older and presence of serious comorbidities.  The bleeding index is 'intermediate risk'.  Positive CHADS2 values include History of HTN and History of Diabetes.  Negative CHADS2 values include Age > 77 years old.  Her last INR was 1.9.  Anticoagulation responsible Ayo Smoak: Shirlee Latch MD, Dalton.  INR POC: 1.7.  Cuvette Lot#: [id number].  Exp: 08/2011.    Anticoagulation Management Assessment/Plan:      The patient's current anticoagulation dose is Coumadin 2.5 mg tabs: as directed by coumadin clinic.  The target INR is 2.0-3.0.  The next INR is due 07/07/2010.  Anticoagulation instructions were given to patient.  Results were reviewed/authorized by Weston Brass, PharmD.  She was notified by Weston Brass PharmD.         Prior Anticoagulation Instructions: INR 1.6 Today take an extra tablet. Then resume normal dosing schedule of 2 tablets on monday, wednesday, friday, and saturday. And 2.5 tablets all other days. Recheck in 3 weeks.   Current Anticoagulation Instructions: INR 1.7  Increase dose to 2 1/2 tablets every day except 2 tablets on Monday and Friday.  Recheck INR in 2 weeks.

## 2010-08-03 NOTE — Medication Information (Signed)
Summary: rov/tm  Anticoagulant Therapy  Managed by: Windell Hummingbird, RN Referring MD: Johney Frame MD, Fayrene Fearing PCP: Dr Jimmy Picket Supervising MD: Tenny Craw MD, Gunnar Fusi Indication 1: Atrial Fibrillation Lab Used: [facility] [place] Site: [address] INR POC 3.1 INR RANGE 2.0-2.5  Dietary changes: no    Health status changes: no    Bleeding/hemorrhagic complications: no    Recent/future hospitalizations: yes       Details: TEE on 2/13 and ablation on 2/14  Any changes in medication regimen? no    Recent/future dental: no  Any missed doses?: no       Is patient compliant with meds? yes       Allergies: No Known Drug Allergies  Anticoagulation Management History:      The patient is taking warfarin and comes in today for a routine follow up visit.  Positive risk factors for bleeding include an age of 65 years or older and presence of serious comorbidities.  The bleeding index is 'intermediate risk'.  Positive CHADS2 values include History of HTN and History of Diabetes.  Negative CHADS2 values include Age > 67 years old.  Her last INR was 1.9.  Anticoagulation responsible provider: Tenny Craw MD, Gunnar Fusi.  INR POC: 3.1.  Cuvette Lot#: [id number].  Exp: 07/2011.    Anticoagulation Management Assessment/Plan:      The patient's current anticoagulation dose is Coumadin 2.5 mg tabs: as directed by coumadin clinic.  The target INR is 2.0-3.0.  The next INR is due 08/04/2010.  Anticoagulation instructions were given to patient.  Results were reviewed/authorized by Windell Hummingbird, RN.  She was notified by Windell Hummingbird, RN.         Prior Anticoagulation Instructions: See weekly Pending TEE on 2/13 and Ablation on 2/14  INR 2.0 Today take extra 1/2 pill then resume 2 pills everyday except 2.5 pills on Sundays, Tuesdays and Thursdays. Recheck in one week.   Current Anticoagulation Instructions: INR 3.1  Continue same dose of 2 tablets every day except 2 1/2 tablets on Sunday, Tuesday and  Thursday.  Recheck INR in 1 week.

## 2010-08-03 NOTE — Letter (Signed)
Summary: ELectrophysiology/Ablation Procedure Instructions  [employer], Main Office  [address]   [place], [place] [address]   Phone: [phone]  Fax: [phone]     Electrophysiology/Ablation Procedure Instructions    You are scheduled for a(n) A-Fib Ablation on August 15, 2010 at 7:30 am with Dr. Johney Frame.   1.  Please come to the Short Stay Center at [facility] at 5:30 am on the day of your procedure.  2.  Come prepared to stay overnight.   Please bring your insurance cards and a list of your medications.  3.  Come to the [place] office on August 10, 2010 at 1:00 pm for lab work.  The lab at [facility] is open from 8:30 AM to 1:30 PM and 2:30 PM to 5:00 PM.  The lab at [facility] is open from 7:30 AM to 5:30 PM.  You do not have to be fasting.  4.  Do not have anything to eat or drink after midnight the night before your procedure.  5.  Take meds with a small sip of water.   6.  Educational material received:  _____ EP   __x___ Ablation   * Occasionally, EP studies and ablations can become lengthy.  Please make your family aware of this before your procedure starts.  Average time ranges from 2-8 hours for EP studies/ablations.  Your physician will locate your family after the procedure with the results.  * If you have any questions after you get home, please call the office at [phone]. Dennis Bast, RN  TEE- Scheduled for 2/13 at 11:00 am. Please be at short stay at 9:00 am.  You will need to have a responsible driver.  Nothing to eat after midnight and no water after 4:00 am.

## 2010-08-03 NOTE — Progress Notes (Signed)
Summary: rx refill  Phone Note From Pharmacy Call back at [phone]   Refills Requested: Medication #1:  MULTAQ 400 MG TABS Take 1 tablet twice a day Caller: [facility] Reason for Call: Needs renewal Summary of Call: MULTAQ 400 MG TABS needs refill for 90days and 3 refills. pt ref# [id number].  Initial call taken by: Roe Coombs,  July 06, 2010 11:18 AM  Follow-up for Phone Call        RX sent into pharmacy. Marrion Coy, CNA  July 06, 2010 12:12 PM  Follow-up by: Marrion Coy, CNA,  July 06, 2010 12:12 PM    Prescriptions: MULTAQ 400 MG TABS (DRONEDARONE HCL) Take 1 tablet twice a day  #180 x 2   Entered by:   Marrion Coy, CNA   Authorized by:   Hillis Range, MD   Signed by:   Marrion Coy, CNA on 07/06/2010   Method used:   Faxed to ...       [facility] Environmental education officer)       [address] [address]       [place], [place]  [address]       Ph: [phone]       Fax: [phone]   RxID:   [id number]

## 2010-08-03 NOTE — Medication Information (Signed)
Summary: rov  Anticoagulant Therapy  Managed by: Bethena Midget, RN, BSN Referring MD: Johney Frame MD, Fayrene Fearing PCP: Dr Jimmy Picket Supervising MD: Antoine Poche MD, Fayrene Fearing Indication 1: Atrial Fibrillation Lab Used: [facility]  Site: [address] INR POC 2.6 INR RANGE 2.0-2.5  Dietary changes: no    Health status changes: no    Bleeding/hemorrhagic complications: no    Recent/future hospitalizations: no    Any changes in medication regimen? no    Recent/future dental: no  Any missed doses?: no       Is patient compliant with meds? yes      Comments: Pending Alabtion on 08/15/10, TEE on 08/14/10  Allergies: No Known Drug Allergies  Anticoagulation Management History:      The patient is taking warfarin and comes in today for a routine follow up visit.  Positive risk factors for bleeding include an age of 10 years or older and presence of serious comorbidities.  The bleeding index is 'intermediate risk'.  Positive CHADS2 values include History of HTN and History of Diabetes.  Negative CHADS2 values include Age > 68 years old.  Her last INR was 1.9.  Anticoagulation responsible provider: Antoine Poche MD, Fayrene Fearing.  INR POC: 2.6.  Cuvette Lot#: [id number].  Exp: 08/2011.    Anticoagulation Management Assessment/Plan:      The patient's current anticoagulation dose is Coumadin 2.5 mg tabs: as directed by coumadin clinic.  The target INR is 2.0-3.0.  The next INR is due 07/21/2010.  Anticoagulation instructions were given to patient.  Results were reviewed/authorized by Bethena Midget, RN, BSN.  She was notified by Bethena Midget, RN, BSN.         Prior Anticoagulation Instructions: INR 5.7 (INR goal: 2-3)  Skip Saturday and Sunday doses.  Take 2 and 1/2 tablets on Sundays, Tuesdays, and Thursdays and 2 tablets on Mondays, Wednesdays, Fridays, and Saturdays.  Current Anticoagulation Instructions: INR 2.6 Continue 2 pills everyday except 2.5 pills on Sundays, Tuesdays and  Thursdays. Recheck in 1 weeks.

## 2010-08-03 NOTE — Medication Information (Signed)
Summary: rov/tm  Anticoagulant Therapy  Managed by: Bethena Midget, RN, BSN Referring MD: Johney Frame MD, Fayrene Fearing PCP: Dr Jimmy Picket Supervising MD: Shirlee Latch MD, Alessio Bogan Indication 1: Atrial Fibrillation Lab Used: [facility] [place] Site: [address] INR POC 2.0 INR RANGE 2.0-2.5  Dietary changes: no    Health status changes: no    Bleeding/hemorrhagic complications: no    Recent/future hospitalizations: no    Any changes in medication regimen? no    Recent/future dental: no  Any missed doses?: no       Is patient compliant with meds? yes      Comments: Pending TEE on 2/13 and Ablation on 2/14 with Dr Johney Frame  Allergies: No Known Drug Allergies  Anticoagulation Management History:      The patient is taking warfarin and comes in today for a routine follow up visit.  Positive risk factors for bleeding include an age of 67 years or older and presence of serious comorbidities.  The bleeding index is 'intermediate risk'.  Positive CHADS2 values include History of HTN and History of Diabetes.  Negative CHADS2 values include Age > 67 years old.  Her last INR was 1.9.  Anticoagulation responsible provider: Shirlee Latch MD, Severino Paolo.  INR POC: 2.0.  Cuvette Lot#: [id number].  Exp: 07/2011.    Anticoagulation Management Assessment/Plan:      The patient's current anticoagulation dose is Coumadin 2.5 mg tabs: as directed by coumadin clinic.  The target INR is 2.0-3.0.  The next INR is due 07/28/2010.  Anticoagulation instructions were given to patient.  Results were reviewed/authorized by Bethena Midget, RN, BSN.  She was notified by Bethena Midget, RN, BSN.         Prior Anticoagulation Instructions: INR 2.6 Continue 2 pills everyday except 2.5 pills on Sundays, Tuesdays and Thursdays. Recheck in 1 weeks.   Current Anticoagulation Instructions: See weekly Pending TEE on 2/13 and Ablation on 2/14  INR 2.0 Today take extra 1/2 pill then resume 2 pills everyday except 2.5 pills on  Sundays, Tuesdays and Thursdays. Recheck in one week.

## 2010-08-03 NOTE — Assessment & Plan Note (Signed)
Summary: 3 month rov.sl   Visit Type:  Follow-up Referring Provider:  Dr. Johney Frame Primary Provider:  Dr Jimmy Picket  CC:  c/o sob , fatigue , and and heart racing makes cough.  History of Present Illness: [name] presents today for cardiology follow-up.  She continues to have palpitations of increasing frequency and duration.  Episodes typically last 1-2 hours and are now occuring most days.  She is unaware of triggers for her afib.   She has coronary disease and had a Xience placed  in October of 2010.  She reports significant fatigue with her afib as well as decreased exercise tolerance. INRs remain labile with coumadin. She denies symptoms of chest pain, shortness of breath, orthopnea, PND, lower extremity edema, dizziness, presyncope, syncope, or neurologic sequela. The patient is tolerating medications without difficulties and is otherwise without complaint today.  She has had no further bleeding off coumadin.  Current Medications (verified): 1)  Amitriptyline Hcl 50 Mg Tabs (Amitriptyline Hcl) .[name].. 1 Tab Once Daily 2)  Glucophage 500 Mg Tabs (Metformin Hcl) .... 1/2 Tab Once Daily 3)  Vitamin B12 Shot .... Q Monthly 4)  Crestor 40 Mg Tabs (Rosuvastatin Calcium) .... Take One Daily 5)  Nitrostat 0.4 Mg Subl (Nitroglycerin) .[name].. 1 Tablet Under Tongue At Onset of Chest Pain; You May Repeat Every 5 Minutes For Up To 3 Doses. 6)  Protonix 40 Mg Tbec (Pantoprazole Sodium) .... Take One Tablet By Mouth Daily 7)  Multaq 400 Mg Tabs (Dronedarone Hcl) .... Take 1 Tablet Twice A Day 8)  Colace 100 Mg Caps (Docusate Sodium) .[name].. 1 Tab Two Times A Day 9)  Ferrous Sulfate 325 (65 Fe) Mg Tabs (Ferrous Sulfate) .[name].. 1 Tab Two Times A Day 10)  Metoprolol Tartrate 25 Mg Tabs (Metoprolol Tartrate) .... 1/2 Tab Bid 11)  Lisinopril 10 Mg Tabs (Lisinopril) .... Take One Tablet By Mouth Daily 12)  Hydrochlorothiazide 25 Mg Tabs (Hydrochlorothiazide) .... Take One Tablet By Mouth Daily. 13)  Coumadin  2.5 Mg Tabs (Warfarin Sodium) .... As Directed By Coumadin Clinic  Allergies: No Known Drug Allergies  Past History:  Past Medical History: Reviewed history from 08/17/2009 and no changes required. Arthritis DIABETES MELLITUS, TYPE II (ICD-250.00) SKIN CANCER, HX OF (ICD-V10.83) HYPERLIPIDEMIA-MIXED (ICD-272.4) CAD, NATIVE VESSEL (ICD-414.01) s/p PCI RCA (mid) with a XIENCE stent 04/04/09 Paroxysmal atrial fibrillation ESSENTIAL HYPERTENSION, BENIGN (ICD-401.1)    Past Surgical History: Reviewed history from 04/27/2009 and no changes required. TAH Appendectomy Cholecystectomy Spinal cyst removed with three surgeries required Scalp BCC removed Hysterectomy Stent place 04-04-2009  Family History: Reviewed history from 04/01/2009 and no changes required. Father, mother, brother all have CAD. Brother had CABG age 38. No FH of thrombosis  Social History: Reviewed history from 04/01/2009 and no changes required. Pt lives in [place] with husband.  Retired from [employer].  Tob- quit 1991 (previously 1PPD x 20 years).  ETOH- rare.  Drugs- denies  Review of Systems       All systems are reviewed and negative except as listed in the HPI.   Vital Signs:  Patient profile:   66 year old female Height:      62 inches Weight:      167.25 pounds BMI:     30.70 Pulse rate:   77 / minute Pulse rhythm:   regular BP sitting:   104 / 68  (right arm) Cuff size:   regular  Vitals Entered By: Scherrie Bateman, LPN (July 07, 2010 2:43 PM)  Physical  Exam  General:  Well developed, well nourished, in no acute distress. Head:  normocephalic and atraumatic Eyes:  PERRLA/EOM intact; conjunctiva and lids normal. Mouth:  Teeth, gums and palate normal. Oral mucosa normal. Neck:  Neck supple, no JVD. No masses, thyromegaly or abnormal cervical nodes. Lungs:  Clear bilaterally to auscultation and percussion. Heart:  Non-displaced PMI, chest non-tender; regular rate and rhythm, S1, S2  without murmurs, rubs or gallops. Carotid upstroke normal, no bruit. Normal abdominal aortic size, no bruits. Femorals normal pulses, no bruits. Pedals normal pulses. No edema, no varicosities. Abdomen:  Bowel sounds positive; abdomen soft and non-tender without masses, organomegaly, or hernias noted. No hepatosplenomegaly. Msk:  Back normal, normal gait. Muscle strength and tone normal. Extremities:  No clubbing or cyanosis. Neurologic:  Alert and oriented x 3. Skin:  Intact without lesions or rashes. Psych:  Normal affect.   Echocardiogram  Procedure date:  08/01/2009  Findings:       Study Conclusions    - Left ventricle: The cavity size was normal. Wall thickness was     normal. Systolic function was normal. The estimated ejection     fraction was in the range of 55% to 60%.   - Mitral valve: Mild regurgitation.   - Right ventricle: The cavity size was mildly dilated.  Left atrium                        Estimated        5 mm Hg  -------   AP dim           32 mm     ------  CVP   AP dim index   1.84 cm/m^2 <2.2    Right ventricle                                      Pressure, S     27 mm Hg  <30    --------------------------------------------------------------------   Prepared and Electronically Authenticated by    Willa Rough, MD, [facility]   [id number]     EKG  Procedure date:  07/07/2010  Findings:      sinus rhythm 77 bpm, otherwise normal ekg  Impression & Recommendations:  Problem # 1:  ATRIAL FIBRILLATION (ICD-427.31) The patient has symptomatic paroxysmal atrial fibrillation which has increased in frequency recently.   She has failed medical therapy with metporolol and multaq.  Therapeutic strategies for afib including medicine and ablation were discussed in detail with the patient today. Risk, benefits, and alternatives to EP study and radiofrequency ablation for afib were also discussed in detail today. These risks include but are not limited to  stroke, bleeding, vascular damage, tamponade, perforation, damage to the esophagus, lungs, and other structures, pulmonary vein stenosis, worsening renal function, and death. The patient understands these risk and wishes to proceed.  We will therefore schedule ablation at the next available time. We will continue weekly INRs Given her labile INRs, we will plan to switch to pradaxa following her ablation.  Problem # 2:  CAD, NATIVE VESSEL (ICD-414.01) no symptoms of ischemia no changes  Problem # 3:  ESSENTIAL HYPERTENSION, BENIGN (ICD-401.1) stable Her updated medication list for this problem includes:    Metoprolol Tartrate 25 Mg Tabs (Metoprolol tartrate) .[name]... 1/2 tab bid    Lisinopril 10 Mg Tabs (Lisinopril) .[name]... Take one tablet by mouth daily  Hydrochlorothiazide 25 Mg Tabs (Hydrochlorothiazide) .[name]... Take one tablet by mouth daily.  Problem # 4:  HYPERLIPIDEMIA-MIXED (ICD-272.4) stable Her updated medication list for this problem includes:    Crestor 40 Mg Tabs (Rosuvastatin calcium) .[name]... Take one daily  Patient Instructions: 1)  Your physician recommends that you continue on your current medications as directed. Please refer to the Current Medication list given to you today. 2)  Your physician has recommended that you have an ablation.  Catheter ablation is a medical procedure used to treat some cardiac arrhythmias (irregular heartbeats). During catheter ablation, a long, thin, flexible tube is put into a blood vessel in your groin (upper thigh), or neck. This tube is called an ablation catheter. It is then guided to your heart through the blood vessel. Radiofrequency waves destroy small areas of heart tissue where abnormal heartbeats may cause an arrhythmia to start.  Please see the instruction sheet given to you today. Prescriptions: MULTAQ 400 MG TABS (DRONEDARONE HCL) Take 1 tablet twice a day  #180 x 3   Entered by:   Scherrie Bateman, LPN   Authorized by:   Hillis Range, MD    Signed by:   Scherrie Bateman, LPN on 60/45/4098   Method used:   Faxed to ...       [facility] Environmental education [facility])       [address] [address]       [place], [place]  [address]       Ph: [phone]       Fax: [phone]   RxID:   [id number]

## 2010-08-03 NOTE — Progress Notes (Signed)
  Phone Note Outgoing Call   Call placed by: rhonda Call placed to: Patient Details for Reason: medications to hold for surgery Summary of Call: adv pt to hold HCTZ and glucophage the am of the procedure. pt expressed understanding. Initial call taken by: Claris Gladden RN,  July 07, 2010 5:49 PM

## 2010-08-03 NOTE — Medication Information (Signed)
Summary: rov/sp  Anticoagulant Therapy  Managed by: Cloyde Reams, RN, BSN Referring MD: Johney Frame MD, Fayrene Fearing PCP: Dr Jimmy Picket Supervising MD: Tenny Craw MD, Gunnar Fusi Indication 1: Atrial Fibrillation Lab Used: [facility] [place] Site: [address] INR POC 5.7 INR RANGE 2.0-2.5  Dietary changes: no    Health status changes: no    Bleeding/hemorrhagic complications: no    Recent/future hospitalizations: no    Any changes in medication regimen? no    Recent/future dental: no  Any missed doses?: no       Is patient compliant with meds? yes       Allergies: No Known Drug Allergies  Anticoagulation Management History:      Positive risk factors for bleeding include an age of 67 years or older and presence of serious comorbidities.  The bleeding index is 'intermediate risk'.  Positive CHADS2 values include History of HTN and History of Diabetes.  Negative CHADS2 values include Age > 28 years old.  Her last INR was 1.9.  Anticoagulation responsible provider: Tenny Craw MD, Gunnar Fusi.  INR POC: 5.7.  Exp: 08/2011.    Anticoagulation Management Assessment/Plan:      The patient's current anticoagulation dose is Coumadin 2.5 mg tabs: as directed by coumadin clinic.  The target INR is 2.0-3.0.  The next INR is due 07/14/2010.  Anticoagulation instructions were given to patient.  Results were reviewed/authorized by Cloyde Reams, RN, BSN.  She was notified by Linward Headland, PharmD candidate.         Prior Anticoagulation Instructions: INR 1.7  Increase dose to 2 1/2 tablets every day except 2 tablets on Monday and Friday.  Recheck INR in 2 weeks.   Current Anticoagulation Instructions: INR 5.7 (INR goal: 2-3)  Skip Saturday and Sunday doses.  Take 2 and 1/2 tablets on Sundays, Tuesdays, and Thursdays and 2 tablets on Mondays, Wednesdays, Fridays, and Saturdays.

## 2010-08-04 ENCOUNTER — Encounter (INDEPENDENT_AMBULATORY_CARE_PROVIDER_SITE_OTHER): Payer: Medicare Other

## 2010-08-04 ENCOUNTER — Encounter: Payer: Self-pay | Admitting: Cardiology

## 2010-08-04 DIAGNOSIS — Z7901 Long term (current) use of anticoagulants: Secondary | ICD-10-CM

## 2010-08-04 DIAGNOSIS — I4891 Unspecified atrial fibrillation: Secondary | ICD-10-CM

## 2010-08-04 LAB — CONVERTED CEMR LAB: POC INR: 2.4

## 2010-08-07 NOTE — Miscellaneous (Signed)
Summary: tsh/427.31/afib  Lab Visit  Orders Today: TLB-TSH (Thyroid Stimulating Hormone) [[id number]]

## 2010-08-09 NOTE — Medication Information (Signed)
Summary: rov/sp  Anticoagulant Therapy  Managed by: Weston Brass, PharmD Referring MD: Johney Frame MD, Fayrene Fearing PCP: Dr Jimmy Picket Supervising MD: Jens Som MD, Arlys John Indication 1: Atrial Fibrillation Lab Used: [facility] [place] Site: [address] INR POC 2.4 INR RANGE 2.0-2.5  Dietary changes: no    Health status changes: no    Bleeding/hemorrhagic complications: no    Recent/future hospitalizations: yes       Details: TEE on 2/13 and A-fib ablation 2/14  Any changes in medication regimen? yes       Details: Stopped amitryptyline and started trazodone  Recent/future dental: no  Any missed doses?: no       Is patient compliant with meds? yes       Allergies: No Known Drug Allergies  Anticoagulation Management History:      The patient is taking warfarin and comes in today for a routine follow up visit.  Positive risk factors for bleeding include an age of 67 years or older and presence of serious comorbidities.  The bleeding index is 'intermediate risk'.  Positive CHADS2 values include History of HTN and History of Diabetes.  Negative CHADS2 values include Age > 67 years old.  Her last INR was 1.9.  Anticoagulation responsible provider: Jens Som MD, Arlys John.  INR POC: 2.4.  Cuvette Lot#: [id number].  Exp: 07/2011.    Anticoagulation Management Assessment/Plan:      The patient's current anticoagulation dose is Coumadin 2.5 mg tabs: as directed by coumadin clinic.  The target INR is 2.0-3.0.  The next INR is due 08/10/2010.  Anticoagulation instructions were given to patient.  Results were reviewed/authorized by Weston Brass, PharmD.  She was notified by Margot Chimes Pharm D Candidate.         Prior Anticoagulation Instructions: INR 3.1  Continue same dose of 2 tablets every day except 2 1/2 tablets on Sunday, Tuesday and Thursday.  Recheck INR in 1 week.   Current Anticoagulation Instructions: INR: 2.4  Continue current Coumadin regimen of 2 tablets (5mg ) on  Monday, Wednesday, Friday, and Saturday and 2 1/2 tablets (6.25mg ) on Sunday, Tuesday, and Thursday.  Patient is getting INR/PT with labs already scheduled for 08/10/2010

## 2010-08-10 ENCOUNTER — Encounter (INDEPENDENT_AMBULATORY_CARE_PROVIDER_SITE_OTHER): Payer: Self-pay | Admitting: *Deleted

## 2010-08-10 ENCOUNTER — Encounter: Payer: Self-pay | Admitting: Internal Medicine

## 2010-08-10 ENCOUNTER — Other Ambulatory Visit: Payer: Self-pay | Admitting: Internal Medicine

## 2010-08-10 ENCOUNTER — Other Ambulatory Visit (INDEPENDENT_AMBULATORY_CARE_PROVIDER_SITE_OTHER): Payer: Medicare Other

## 2010-08-10 DIAGNOSIS — I200 Unstable angina: Secondary | ICD-10-CM

## 2010-08-10 DIAGNOSIS — I4891 Unspecified atrial fibrillation: Secondary | ICD-10-CM

## 2010-08-10 LAB — APTT: aPTT: 39.7 s — ABNORMAL HIGH (ref 21.7–28.8)

## 2010-08-10 LAB — CONVERTED CEMR LAB: POC INR: 2.7

## 2010-08-10 LAB — CBC WITH DIFFERENTIAL/PLATELET
Basophils Relative: 0.4 % (ref 0.0–3.0)
Eosinophils Absolute: 0.1 10*3/uL (ref 0.0–0.7)
HCT: 34 % — ABNORMAL LOW (ref 36.0–46.0)
Hemoglobin: 11.7 g/dL — ABNORMAL LOW (ref 12.0–15.0)
Lymphocytes Relative: 20.7 % (ref 12.0–46.0)
MCHC: 34.5 g/dL (ref 30.0–36.0)
Monocytes Relative: 8.1 % (ref 3.0–12.0)
Neutro Abs: 4.2 10*3/uL (ref 1.4–7.7)
RBC: 3.67 Mil/uL — ABNORMAL LOW (ref 3.87–5.11)

## 2010-08-10 LAB — BASIC METABOLIC PANEL
CO2: 28 mEq/L (ref 19–32)
Calcium: 8.6 mg/dL (ref 8.4–10.5)
Potassium: 3.3 mEq/L — ABNORMAL LOW (ref 3.5–5.1)
Sodium: 132 mEq/L — ABNORMAL LOW (ref 135–145)

## 2010-08-11 ENCOUNTER — Encounter: Payer: Self-pay | Admitting: Internal Medicine

## 2010-08-14 ENCOUNTER — Encounter: Payer: Self-pay | Admitting: Internal Medicine

## 2010-08-14 ENCOUNTER — Ambulatory Visit (HOSPITAL_COMMUNITY)
Admission: RE | Admit: 2010-08-14 | Discharge: 2010-08-14 | Disposition: A | Discharge status: Home / Self Care | Payer: Medicare Other | Source: Ambulatory Visit | Attending: Internal Medicine | Admitting: Internal Medicine

## 2010-08-14 DIAGNOSIS — Q2111 Secundum atrial septal defect: Secondary | ICD-10-CM | POA: Insufficient documentation

## 2010-08-14 DIAGNOSIS — E119 Type 2 diabetes mellitus without complications: Secondary | ICD-10-CM | POA: Insufficient documentation

## 2010-08-14 DIAGNOSIS — E785 Hyperlipidemia, unspecified: Secondary | ICD-10-CM | POA: Insufficient documentation

## 2010-08-14 DIAGNOSIS — Z85828 Personal history of other malignant neoplasm of skin: Secondary | ICD-10-CM | POA: Insufficient documentation

## 2010-08-14 DIAGNOSIS — Q211 Atrial septal defect: Secondary | ICD-10-CM | POA: Insufficient documentation

## 2010-08-14 DIAGNOSIS — Z9861 Coronary angioplasty status: Secondary | ICD-10-CM | POA: Insufficient documentation

## 2010-08-14 DIAGNOSIS — I2510 Atherosclerotic heart disease of native coronary artery without angina pectoris: Secondary | ICD-10-CM | POA: Insufficient documentation

## 2010-08-14 DIAGNOSIS — M129 Arthropathy, unspecified: Secondary | ICD-10-CM | POA: Insufficient documentation

## 2010-08-14 DIAGNOSIS — Z79899 Other long term (current) drug therapy: Secondary | ICD-10-CM | POA: Insufficient documentation

## 2010-08-14 DIAGNOSIS — I10 Essential (primary) hypertension: Secondary | ICD-10-CM | POA: Insufficient documentation

## 2010-08-14 DIAGNOSIS — I4891 Unspecified atrial fibrillation: Secondary | ICD-10-CM

## 2010-08-14 LAB — GLUCOSE, CAPILLARY: Glucose-Capillary: 112 mg/dL — ABNORMAL HIGH (ref 70–99)

## 2010-08-14 LAB — PROTIME-INR: Prothrombin Time: 24.2 seconds — ABNORMAL HIGH (ref 11.6–15.2)

## 2010-08-15 ENCOUNTER — Ambulatory Visit (HOSPITAL_COMMUNITY)
Admission: RE | Admit: 2010-08-15 | Discharge: 2010-08-16 | DRG: 251 | Disposition: A | Discharge status: Home / Self Care | Payer: Medicare Other | Source: Ambulatory Visit | Attending: Internal Medicine | Admitting: Internal Medicine

## 2010-08-15 DIAGNOSIS — Z9861 Coronary angioplasty status: Secondary | ICD-10-CM | POA: Insufficient documentation

## 2010-08-15 DIAGNOSIS — Z85828 Personal history of other malignant neoplasm of skin: Secondary | ICD-10-CM | POA: Insufficient documentation

## 2010-08-15 DIAGNOSIS — Z79899 Other long term (current) drug therapy: Secondary | ICD-10-CM | POA: Insufficient documentation

## 2010-08-15 DIAGNOSIS — Z8679 Personal history of other diseases of the circulatory system: Secondary | ICD-10-CM

## 2010-08-15 DIAGNOSIS — M129 Arthropathy, unspecified: Secondary | ICD-10-CM | POA: Insufficient documentation

## 2010-08-15 DIAGNOSIS — I2510 Atherosclerotic heart disease of native coronary artery without angina pectoris: Secondary | ICD-10-CM | POA: Insufficient documentation

## 2010-08-15 DIAGNOSIS — E119 Type 2 diabetes mellitus without complications: Secondary | ICD-10-CM | POA: Insufficient documentation

## 2010-08-15 DIAGNOSIS — Z7901 Long term (current) use of anticoagulants: Secondary | ICD-10-CM | POA: Insufficient documentation

## 2010-08-15 DIAGNOSIS — I10 Essential (primary) hypertension: Secondary | ICD-10-CM | POA: Insufficient documentation

## 2010-08-15 DIAGNOSIS — E785 Hyperlipidemia, unspecified: Secondary | ICD-10-CM | POA: Insufficient documentation

## 2010-08-15 DIAGNOSIS — I4891 Unspecified atrial fibrillation: Secondary | ICD-10-CM

## 2010-08-15 DIAGNOSIS — Z9889 Other specified postprocedural states: Secondary | ICD-10-CM

## 2010-08-15 HISTORY — DX: Personal history of other diseases of the circulatory system: Z86.79

## 2010-08-15 HISTORY — DX: Personal history of other diseases of the circulatory system: Z98.890

## 2010-08-15 LAB — PROTIME-INR: Prothrombin Time: 24.9 seconds — ABNORMAL HIGH (ref 11.6–15.2)

## 2010-08-15 LAB — POCT ACTIVATED CLOTTING TIME
Activated Clotting Time: 258 seconds
Activated Clotting Time: 299 seconds
Activated Clotting Time: 317 seconds

## 2010-08-15 LAB — GLUCOSE, CAPILLARY: Glucose-Capillary: 101 mg/dL — ABNORMAL HIGH (ref 70–99)

## 2010-08-15 LAB — MRSA PCR SCREENING: MRSA by PCR: NEGATIVE

## 2010-08-16 DIAGNOSIS — I4891 Unspecified atrial fibrillation: Secondary | ICD-10-CM

## 2010-08-16 LAB — BASIC METABOLIC PANEL
Calcium: 8 mg/dL — ABNORMAL LOW (ref 8.4–10.5)
Creatinine, Ser: 1.22 mg/dL — ABNORMAL HIGH (ref 0.4–1.2)
GFR calc Af Amer: 53 mL/min — ABNORMAL LOW (ref >60)

## 2010-08-16 NOTE — Op Note (Signed)
NAMEMarland Krause  [name], [name] NO.:  [id number]  MEDICAL RECORD NO.:  [id number]           PATIENT TYPE:  I  LOCATION:  2927                         FACILITY:  [facility]  PHYSICIAN:  Hillis Range, MD       DATE OF BIRTH:  22-Apr-1946  DATE OF PROCEDURE: DATE OF DISCHARGE:                              OPERATIVE REPORT   SURGEON:  Hillis Range, MD.  FIRST ASSISTANT:  Doylene Canning. Ladona Ridgel, MD.  PREPROCEDURE DIAGNOSIS:  Paroxysmal atrial fibrillation.  POSTPROCEDURE DIAGNOSIS:  Paroxysmal atrial fibrillation.  PROCEDURES: 1. Comprehensive EP study. 2. Coronary sinus pacing and recording. 3. A 3-D mapping of SVT. 4. Radiofrequency ablation of SVT. 5. Arterial blood pressure monitoring. 6. Intracardiac echocardiography. 7. Transseptal puncture. 8. Pulmonary vein venography. 9. Isoproterenol infusion.  INTRODUCTION:  Ms. [name] is a very pleasant 65 year old female with a history of symptomatic paroxysmal atrial fibrillation who presents today for EP study and radiofrequency ablation.  She has previously failed medical therapy with metoprolol and Multaq.  She reports increasing frequency and duration of atrial fibrillation.  She therefore presents today for EP study and radiofrequency ablation.  DESCRIPTION OF PROCEDURE:  Informed and written consent was obtained and the patient was brought to the electrophysiology lab in the fasting state.  She was adequately sedated with intravenous medications as outlined in the anesthesia report.  The patient's right and left groins were prepped and draped in the usual sterile fashion by the EP lab staff.  Using a percutaneous Seldinger technique, two 7-French and one 8- [place] hemostasis sheaths were placed in the right common femoral vein. A 4-French hemostasis sheath was placed in the right common femoral artery.  An 11-French hemostasis sheath was placed in the left common femoral vein.  A 7-French Biosense Webster decapolar  coronary sinus catheter was introduced through the right common femoral vein and advanced into the coronary sinus for recording and pacing from this location.  A 6-French quadripolar Josephson catheter was introduced through the right common femoral vein and advanced into the right ventricle for recording and pacing.  This catheter was then pulled back to the His bundle location.  The patient presented to the electrophysiology lab in normal sinus rhythm.  Her AH interval measured 103 milliseconds with an HV interval of 58 milliseconds.  Her PR interval was 187 milliseconds with a QRS duration of 77 milliseconds and a QT interval of 445 milliseconds.  Ventricular pacing was performed which revealed VA dissociation at baseline.  A 10-French Biosense Webster AcuNav intracardiac echocardiography catheter was introduced through the left common femoral vein and advanced into the right atrium. Intracardiac echocardiography was performed which revealed a normal- sized left atrium.  The patient was noted to have 4 separate pulmonary veins, which were moderate in size.  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  The middle right common femoral vein sheath was exchanged for an 8.5 [place] SL2 transseptal sheath and transseptal access was achieved in a standard fashion using a Brockenbrough needle under biplane fluoroscopy.  Once transseptal access had been achieved, heparin was administered intravenously and intra- arterially in order  to maintain an ACT of greater than 300 seconds throughout the procedure.  A 6-French multipurpose angiographic catheter with guidewire was introduced through the transseptal sheath and positioned over the mouth of all 4 pulmonary veins.  Pulmonary venograms were performed by hand injection of nonionic contrast and demonstrated moderate-sized pulmonary veins with no evidence of pulmonary vein stenosis.  The angiographic catheter was then  removed.  The His bundle catheter was removed and in its place a 3.5 mm Biosense Webster EZ [place] ThermoCool ablation catheter was advanced into the right atrium.  The transseptal sheath was pulled back into the IVC over a guidewire.  The ablation catheter was advanced across the transseptal hole using the wire as a guide.  The transseptal sheath was then re-advanced over the guidewire into the left atrium.  A dual decapolar adjustable circular mapping catheter was introduced through the transseptal sheath and positioned over the mouth of all 4 pulmonary veins.  Three-dimensional electroanatomical mapping was performed using Carto mapping system. This demonstrated moderate activity within all 4 pulmonary veins at baseline.  The patient underwent successful sequential electrical isolation and anatomical encircling of all 4 pulmonary veins using radiofrequency current with a circular mapping catheter as a guide. Following ablation, isoproterenol was infused up to 10 mcg per minute with an adequate acceleration in heart rate.  The patient had no atrial fibrillation, atrial tachycardia, atrial flutter, or PACs observed. Isoproterenol was therefore allowed to wash out.  Atrial pacing was then performed down to a cycle length of 230 milliseconds which revealed no inducible arrhythmias.  The AV Wenckebach cycle length was 430 milliseconds with no evidence of PR greater than RR.  The procedure was therefore considered completed.  All catheters were removed and the sheaths were aspirated and flushed.  The patient was transferred to the recovery area for sheath removal per protocol.  A limited bedside transthoracic echocardiogram revealed no pericardial effusion.  There were no early apparent complications.  CONCLUSIONS: 1. Sinus rhythm upon presentation. 2. Successful electrical isolation and anatomical encircling of all 4     pulmonary veins using radiofrequency current. 3. No inducible  arrhythmias following ablation both on and off of     Isuprel. 4. No early apparent complications.     Hillis Range, MD     JA/MEDQ  D:  08/15/2010  T:  08/16/2010  Job:  [id number]  cc:   Dalbert Mayotte, M.D.  Electronically Signed by Hillis Range MD on 08/16/2010 10:23:07 PM

## 2010-08-17 NOTE — Medication Information (Addendum)
Summary: Coumadin Clinic  Anticoagulant Therapy  Managed by: Cloyde Reams, RN, BSN Referring MD: Johney Frame MD, Fayrene Fearing PCP: Dr Jimmy Picket Supervising MD: Johney Frame MD, Fayrene Fearing Indication 1: Atrial Fibrillation Lab Used: [facility] [place] Site: [address] INR POC 2.7 INR RANGE 2.0-2.5  Dietary changes: no    Health status changes: no    Bleeding/hemorrhagic complications: no    Recent/future hospitalizations: yes       Details: Scheduled for TEE on 08/14/10 and ablation on 08/15/10.  Any changes in medication regimen? no    Recent/future dental: no  Any missed doses?: no       Is patient compliant with meds? yes       Allergies: No Known Drug Allergies  Anticoagulation Management History:      Her anticoagulation is being managed by telephone today.  Positive risk factors for bleeding include an age of 67 years or older and presence of serious comorbidities.  The bleeding index is 'intermediate risk'.  Positive CHADS2 values include History of HTN and History of Diabetes.  Negative CHADS2 values include Age > 58 years old.  Her last INR was 2.7 ratio.  Anticoagulation responsible provider: Estefany Goebel MD, Fayrene Fearing.  INR POC: 2.7.  Exp: 07/2011.    Anticoagulation Management Assessment/Plan:      The patient's current anticoagulation dose is Coumadin 2.5 mg tabs: as directed by coumadin clinic.  The target INR is 2.0-3.0.  The next INR is due 08/22/2010.  Anticoagulation instructions were given to patient.  Results were reviewed/authorized by Cloyde Reams, RN, BSN.  She was notified by Cloyde Reams RN.         Prior Anticoagulation Instructions: INR: 2.4  Continue current Coumadin regimen of 2 tablets (5mg ) on Monday, Wednesday, Friday, and Saturday and 2 1/2 tablets (6.25mg ) on Sunday, Tuesday, and Thursday.  Patient is getting INR/PT with labs already scheduled for 08/10/2010  Current Anticoagulation Instructions: INR 2.7  Called spoke with pt advised to continue  on same dosage 2 tablets daily except 2.5 tablets on Sundays, Tuesdays, and Thursdays. Recheck in 1 week post ablation.

## 2010-08-25 ENCOUNTER — Encounter: Payer: Self-pay | Admitting: Internal Medicine

## 2010-08-25 ENCOUNTER — Encounter (INDEPENDENT_AMBULATORY_CARE_PROVIDER_SITE_OTHER): Payer: Medicare Other

## 2010-08-25 DIAGNOSIS — Z7901 Long term (current) use of anticoagulants: Secondary | ICD-10-CM

## 2010-08-25 DIAGNOSIS — I4891 Unspecified atrial fibrillation: Secondary | ICD-10-CM

## 2010-08-25 LAB — CONVERTED CEMR LAB: POC INR: 1.5

## 2010-08-29 NOTE — Medication Information (Signed)
Summary: rov/ewj  Anticoagulant Therapy  Managed by: Bethena Midget, RN, BSN Referring MD: Johney Frame MD, Fayrene Fearing PCP: Dr Jimmy Picket Supervising MD: Tenny Craw MD, Gunnar Fusi Indication 1: Atrial Fibrillation Lab Used: [facility] [place] Site: [address] INR POC 1.5 INR RANGE 2.0-2.5  Dietary changes: no    Health status changes: no    Bleeding/hemorrhagic complications: no    Recent/future hospitalizations: yes       Details: S/P Ablatiion 10 days ago   Any changes in medication regimen? no    Recent/future dental: no  Any missed doses?: no       Is patient compliant with meds? yes       Current Medications (verified): 1)  Glucophage 500 Mg Tabs (Metformin Hcl) .... 1/2 Tab Once Daily 2)  Vitamin B12 Shot .... Q Monthly 3)  Crestor 40 Mg Tabs (Rosuvastatin Calcium) .... Take One Daily 4)  Nitrostat 0.4 Mg Subl (Nitroglycerin) .[name].. 1 Tablet Under Tongue At Onset of Chest Pain; You May Repeat Every 5 Minutes For Up To 3 Doses. 5)  Protonix 40 Mg Tbec (Pantoprazole Sodium) .... Take One Tablet By Mouth Daily 6)  Multaq 400 Mg Tabs (Dronedarone Hcl) .... Take 1 Tablet Twice A Day 7)  Lisinopril 10 Mg Tabs (Lisinopril) .... Take One Tablet By Mouth Daily 8)  Coumadin 2.5 Mg Tabs (Warfarin Sodium) .... As Directed By Coumadin Clinic 9)  Trazodone Hcl 50 Mg Tabs (Trazodone Hcl) .... 2 Tablets At Bedtime For Sleep  Allergies: No Known Drug Allergies  Anticoagulation Management History:      The patient is taking warfarin and comes in today for a routine follow up visit.  Positive risk factors for bleeding include an age of 12 years or older and presence of serious comorbidities.  The bleeding index is 'intermediate risk'.  Positive CHADS2 values include History of HTN and History of Diabetes.  Negative CHADS2 values include Age > 25 years old.  Her last INR was 2.7 ratio.  Anticoagulation responsible provider: Tenny Craw MD, Gunnar Fusi.  INR POC: 1.5.  Exp: 07/2011.     Anticoagulation Management Assessment/Plan:      The patient's current anticoagulation dose is Coumadin 2.5 mg tabs: as directed by coumadin clinic.  The target INR is 2.0-3.0.  The next INR is due 09/08/2010.  Anticoagulation instructions were given to patient.  Results were reviewed/authorized by Bethena Midget, RN, BSN.  She was notified by Bethena Midget, RN, BSN.         Prior Anticoagulation Instructions: INR 2.7  Called spoke with pt advised to continue on same dosage 2 tablets daily except 2.5 tablets on Sundays, Tuesdays, and Thursdays. Recheck in 1 week post ablation.   Current Anticoagulation Instructions: INR 1.5 Today take extra 1 pill  tomorrow take 3 pills then resume 2 pills everyday except 2 1/2 pills on Sundays, Tuesdays and Thursdays. Recheck in 2 weeks.

## 2010-09-08 ENCOUNTER — Encounter: Payer: Self-pay | Admitting: Internal Medicine

## 2010-09-08 ENCOUNTER — Encounter (INDEPENDENT_AMBULATORY_CARE_PROVIDER_SITE_OTHER): Payer: Medicare Other

## 2010-09-08 ENCOUNTER — Encounter: Payer: Self-pay | Admitting: Cardiovascular Disease

## 2010-09-08 DIAGNOSIS — Z7901 Long term (current) use of anticoagulants: Secondary | ICD-10-CM

## 2010-09-08 DIAGNOSIS — I4891 Unspecified atrial fibrillation: Secondary | ICD-10-CM

## 2010-09-12 NOTE — Medication Information (Signed)
Summary: rov/tm  Anticoagulant Therapy  Managed by: Windell Hummingbird, RN Referring MD: Johney Frame MD, Fayrene Fearing PCP: Dr Jimmy Picket Supervising MD: Eden Emms MD, Theron Arista Indication 1: Atrial Fibrillation Lab Used: [facility] [place] Site: [address] INR POC 2.0 INR RANGE 2.0-2.5  Dietary changes: no    Health status changes: no    Bleeding/hemorrhagic complications: no    Recent/future hospitalizations: no    Any changes in medication regimen? no    Recent/future dental: no  Any missed doses?: no       Is patient compliant with meds? yes       Allergies: No Known Drug Allergies  Anticoagulation Management History:      The patient is taking warfarin and comes in today for a routine follow up visit.  Positive risk factors for bleeding include an age of 65 years or older and presence of serious comorbidities.  The bleeding index is 'intermediate risk'.  Positive CHADS2 values include History of HTN and History of Diabetes.  Negative CHADS2 values include Age > 67 years old.  Her last INR was 2.7 ratio.  Anticoagulation responsible provider: Eden Emms MD, Theron Arista.  INR POC: 2.0.  Cuvette Lot#: [id number].  Exp: 08/2011.    Anticoagulation Management Assessment/Plan:      The patient's current anticoagulation dose is Coumadin 2.5 mg tabs: as directed by coumadin clinic.  The target INR is 2.0-3.0.  The next INR is due 10/05/2010.  Anticoagulation instructions were given to patient.  Results were reviewed/authorized by Windell Hummingbird, RN.  She was notified by Windell Hummingbird, RN.         Prior Anticoagulation Instructions: INR 1.5 Today take extra 1 pill  tomorrow take 3 pills then resume 2 pills everyday except 2 1/2 pills on Sundays, Tuesdays and Thursdays. Recheck in 2 weeks.   Current Anticoagulation Instructions: INR 2.0 Continue taking 2 tablets every day, except take 2 1/2 tablets on Sundays, Tuesdays, and Thursdays. Recheck in 4 weeks.

## 2010-09-15 NOTE — Discharge Summary (Signed)
NAMEMarland Kitchen  [name], [name] NO.:  [id number]  MEDICAL RECORD NO.:  [id number]           PATIENT TYPE:  I  LOCATION:  2927                         FACILITY:  [facility]  PHYSICIAN:  Hillis Range, MD       DATE OF BIRTH:  [birth year]  DATE OF ADMISSION:  08/15/2010 DATE OF DISCHARGE:  08/16/2010                              DISCHARGE SUMMARY   PRIMARY CARE PHYSICIAN:  Dalbert Mayotte, MD  ELECTROPHYSIOLOGIST:  Hillis Range, MD  PRIMARY DIAGNOSIS:  Atrial fibrillation.  SECONDARY DIAGNOSES: 1. Arthritis. 2. Type 2 diabetes. 3. History of skin cancer. 4. Hyperlipidemia. 5. Coronary artery disease, status post percutaneous coronary     intervention to the right coronary artery with a Xience stent in     October 2010. 6. Hypertension. 7. Chronic anticoagulation with warfarin.  ALLERGIES:  The patient has no known drug allergies.  PROCEDURE THIS ADMISSION: 1. Electrophysiology studies and radiofrequency catheter ablation of     atrial fibrillation by Dr. Johney Frame on August 15, 2010.  This study     demonstrated sinus rhythm upon presentation, successful electrical     isolation of all four pulmonary veins using the frequency current,     no inducible arrhythmias following ablation on and off Isuprel and     no early apparent complications.  BRIEF HISTORY OF PRESENT ILLNESS:  [name] is a 65 year old female with a history of paroxysmal atrial fibrillation.  She has failed medical therapy with Multaq.  Therapy strategies for atrial fibrillation including alternative antiarrhythmic medications and ablation were discussed with the patient by Dr. Johney Frame.  Risks, benefits and alternatives to ablation were discussed with the patient and wished to proceed.  HOSPITAL COURSE:  The patient was admitted on August 15, 2010 for planned ablation of atrial fibrillation.  She has had a TEE on August 14, 2010, which demonstrated an ejection fraction of 40% with no evidence of  thrombus in the atrial cavity or appendage.  She underwent EP study and radiofrequency catheter ablation by Dr. Johney Frame, with details as outlined above.  She was monitored on telemetry overnight, which demonstrated sinus rhythm.  Groin incisions were without hematoma or bruit.  Dr. Johney Frame examined the patient on August 16, 2010, considered her stable for discharge.  Plan for discharge include stopping the patient's hydrochlorothiazide because of recent decreased sodium and potassium.  The patient should also continue her proton pump inhibitor.  Per Dr. Johney Frame, she can stop her Multaq in 6 weeks if she has no recurrent paroxysmal atrial fibrillation.  DISCHARGE INSTRUCTIONS: 1. Increase activity slowly. 2. No driving for 4 days. 3. Follow a low-sodium, heart-healthy diet. 4. Keep groin incisions clean and dry.  FOLLOWUP APPOINTMENTS: 1. [facility] on April 25, 2011 at 10:45     a.m. 2. Dr. Johney Frame on December 04, 2010 at 9:30 a.m. 3. Dr. Drue Second as scheduled.  DISCHARGE MEDICATIONS: 1. Colace 100 mg twice daily as needed. 2. Warfarin as directed by [facility]. 3. Metformin XR 500 mg daily. 4. Metoprolol tartrate 25 mg one tablet twice daily. 5. Multaq 400 mg twice daily. 6.  Nitroglycerin as needed for chest pain. 7. Protonix 40 mg daily. 8. Crestor 40 mg daily. 9. Trazodone 50 mg one to two tablets at bedtime as needed. 10.Vitamin B12 subcu monthly.  Of note, the patient's hydrochlorothiazide was discontinued this admission.  DISPOSITION:  The patient was seen and examined by Dr. Johney Frame on April 16, 2011, considered stable for discharge.  DURATION OF DISCHARGE ENCOUNTER:  Thirty five minutes.     Gypsy Balsam, RN,BSN   ______________________________ Hillis Range, MD    AS/MEDQ  D:  08/16/2010  T:  08/16/2010  Job:  [id number]  cc:   Dalbert Mayotte, M.D.  Electronically Signed by Gypsy Balsam RNBSN on 08/28/2010 02:37:13  PM Electronically Signed by Hillis Range MD on 09/15/2010 10:50:48 PM

## 2010-09-17 LAB — DIFFERENTIAL
Basophils Absolute: 0 10*3/uL (ref 0.0–0.1)
Basophils Relative: 1 % (ref 0–1)
Eosinophils Relative: 1 % (ref 0–5)
Lymphocytes Relative: 28 % (ref 12–46)
Neutro Abs: 3 10*3/uL (ref 1.7–7.7)

## 2010-09-17 LAB — CBC
HCT: 37 % (ref 36.0–46.0)
MCHC: 33.9 g/dL (ref 30.0–36.0)
Platelets: 166 10*3/uL (ref 150–400)
RDW: 14 % (ref 11.5–15.5)

## 2010-09-17 LAB — POCT CARDIAC MARKERS
CKMB, poc: <1 ng/mL — ABNORMAL LOW (ref 1.0–8.0)
CKMB, poc: <1 ng/mL — ABNORMAL LOW (ref 1.0–8.0)
Myoglobin, poc: 78.3 ng/mL (ref 12–200)
Troponin i, poc: <0.05 ng/mL (ref 0.00–0.09)
Troponin i, poc: <0.05 ng/mL (ref 0.00–0.09)

## 2010-09-17 LAB — BASIC METABOLIC PANEL
BUN: 9 mg/dL (ref 6–23)
CO2: 24 mEq/L (ref 19–32)
Calcium: 9.4 mg/dL (ref 8.4–10.5)
GFR calc non Af Amer: 54 mL/min — ABNORMAL LOW (ref >60)
Glucose, Bld: 100 mg/dL — ABNORMAL HIGH (ref 70–99)

## 2010-09-17 LAB — CK TOTAL AND CKMB (NOT AT ARMC): CK, MB: 1.1 ng/mL (ref 0.3–4.0)

## 2010-09-17 LAB — APTT: aPTT: 29 seconds (ref 24–37)

## 2010-09-17 LAB — MAGNESIUM: Magnesium: 2.2 mg/dL (ref 1.5–2.5)

## 2010-09-18 LAB — CBC
HCT: 26 % — ABNORMAL LOW (ref 36.0–46.0)
HCT: 28.9 % — ABNORMAL LOW (ref 36.0–46.0)
Hemoglobin: 10.1 g/dL — ABNORMAL LOW (ref 12.0–15.0)
Hemoglobin: 9.8 g/dL — ABNORMAL LOW (ref 12.0–15.0)
MCHC: 33.9 g/dL (ref 30.0–36.0)
MCV: 95.2 fL (ref 78.0–100.0)
MCV: 95.4 fL (ref 78.0–100.0)
RBC: 2.73 MIL/uL — ABNORMAL LOW (ref 3.87–5.11)
RBC: 3.03 MIL/uL — ABNORMAL LOW (ref 3.87–5.11)
RBC: 3.11 MIL/uL — ABNORMAL LOW (ref 3.87–5.11)
RDW: 13.9 % (ref 11.5–15.5)
WBC: 4.3 10*3/uL (ref 4.0–10.5)

## 2010-09-18 LAB — PROTIME-INR
INR: 1.35 (ref 0.00–1.49)
Prothrombin Time: 16.6 seconds — ABNORMAL HIGH (ref 11.6–15.2)

## 2010-09-18 LAB — BASIC METABOLIC PANEL
BUN: 16 mg/dL (ref 6–23)
CO2: 26 mEq/L (ref 19–32)
CO2: 27 mEq/L (ref 19–32)
Calcium: 8.8 mg/dL (ref 8.4–10.5)
Calcium: 8.9 mg/dL (ref 8.4–10.5)
Chloride: 104 mEq/L (ref 96–112)
Chloride: 109 mEq/L (ref 96–112)
Creatinine, Ser: 1.37 mg/dL — ABNORMAL HIGH (ref 0.4–1.2)
GFR calc Af Amer: 41 mL/min — ABNORMAL LOW (ref >60)
GFR calc Af Amer: 58 mL/min — ABNORMAL LOW (ref >60)
GFR calc non Af Amer: 48 mL/min — ABNORMAL LOW (ref >60)
Glucose, Bld: 100 mg/dL — ABNORMAL HIGH (ref 70–99)
Glucose, Bld: 104 mg/dL — ABNORMAL HIGH (ref 70–99)
Glucose, Bld: 117 mg/dL — ABNORMAL HIGH (ref 70–99)
Potassium: 3.9 mEq/L (ref 3.5–5.1)
Sodium: 139 mEq/L (ref 135–145)
Sodium: 142 mEq/L (ref 135–145)

## 2010-09-18 LAB — URINALYSIS, ROUTINE W REFLEX MICROSCOPIC
Ketones, ur: NEGATIVE mg/dL
Nitrite: NEGATIVE
Protein, ur: NEGATIVE mg/dL
pH: 7 (ref 5.0–8.0)

## 2010-09-18 LAB — D-DIMER, QUANTITATIVE: D-Dimer, Quant: 0.33 ug/mL-FEU (ref 0.00–0.48)

## 2010-09-18 LAB — POCT CARDIAC MARKERS
Myoglobin, poc: 96.6 ng/mL (ref 12–200)
Troponin i, poc: <0.05 ng/mL (ref 0.00–0.09)

## 2010-09-18 LAB — CARDIAC PANEL(CRET KIN+CKTOT+MB+TROPI)
Relative Index: 1 (ref 0.0–2.5)
Relative Index: INVALID (ref 0.0–2.5)

## 2010-09-18 LAB — CK TOTAL AND CKMB (NOT AT ARMC)
CK, MB: 0.9 ng/mL (ref 0.3–4.0)
Total CK: 101 U/L (ref 7–177)

## 2010-09-18 LAB — DIFFERENTIAL
Basophils Relative: 0 % (ref 0–1)
Eosinophils Relative: 3 % (ref 0–5)
Monocytes Absolute: 0.3 10*3/uL (ref 0.1–1.0)
Monocytes Relative: 6 % (ref 3–12)
Neutro Abs: 3.4 10*3/uL (ref 1.7–7.7)

## 2010-09-18 LAB — IRON AND TIBC
Iron: 55 ug/dL (ref 42–135)
UIBC: 166 ug/dL

## 2010-09-18 LAB — POCT I-STAT, CHEM 8
BUN: 20 mg/dL (ref 6–23)
Calcium, Ion: 1.09 mmol/L — ABNORMAL LOW (ref 1.12–1.32)
Chloride: 103 mEq/L (ref 96–112)
Creatinine, Ser: 1.5 mg/dL — ABNORMAL HIGH (ref 0.4–1.2)

## 2010-09-18 LAB — URINE CULTURE

## 2010-09-18 LAB — GLUCOSE, CAPILLARY
Glucose-Capillary: 112 mg/dL — ABNORMAL HIGH (ref 70–99)
Glucose-Capillary: 97 mg/dL (ref 70–99)

## 2010-09-18 LAB — FERRITIN: Ferritin: 278 ng/mL (ref 10–291)

## 2010-09-18 LAB — APTT: aPTT: 30 seconds (ref 24–37)

## 2010-10-05 ENCOUNTER — Ambulatory Visit (INDEPENDENT_AMBULATORY_CARE_PROVIDER_SITE_OTHER): Payer: Medicare Other | Admitting: *Deleted

## 2010-10-05 DIAGNOSIS — I4891 Unspecified atrial fibrillation: Secondary | ICD-10-CM

## 2010-10-05 LAB — GLUCOSE, CAPILLARY
Glucose-Capillary: 111 mg/dL — ABNORMAL HIGH (ref 70–99)
Glucose-Capillary: 101 mg/dL — ABNORMAL HIGH (ref 70–99)
Glucose-Capillary: 106 mg/dL — ABNORMAL HIGH (ref 70–99)
Glucose-Capillary: 108 mg/dL — ABNORMAL HIGH (ref 70–99)
Glucose-Capillary: 111 mg/dL — ABNORMAL HIGH (ref 70–99)
Glucose-Capillary: 112 mg/dL — ABNORMAL HIGH (ref 70–99)
Glucose-Capillary: 89 mg/dL (ref 70–99)

## 2010-10-05 LAB — BASIC METABOLIC PANEL
BUN: 13 mg/dL (ref 6–23)
BUN: 18 mg/dL (ref 6–23)
BUN: 18 mg/dL (ref 6–23)
CO2: 25 mEq/L (ref 19–32)
CO2: 26 mEq/L (ref 19–32)
CO2: 27 mEq/L (ref 19–32)
Chloride: 104 mEq/L (ref 96–112)
Chloride: 106 mEq/L (ref 96–112)
Creatinine, Ser: 1.18 mg/dL (ref 0.4–1.2)
GFR calc Af Amer: 56 mL/min — ABNORMAL LOW (ref >60)
GFR calc non Af Amer: 43 mL/min — ABNORMAL LOW (ref >60)
GFR calc non Af Amer: 46 mL/min — ABNORMAL LOW (ref >60)
GFR calc non Af Amer: 46 mL/min — ABNORMAL LOW (ref >60)
GFR calc non Af Amer: 47 mL/min — ABNORMAL LOW (ref >60)
Glucose, Bld: 122 mg/dL — ABNORMAL HIGH (ref 70–99)
Glucose, Bld: 131 mg/dL — ABNORMAL HIGH (ref 70–99)
Glucose, Bld: 93 mg/dL (ref 70–99)
Potassium: 4 mEq/L (ref 3.5–5.1)
Potassium: 4.1 mEq/L (ref 3.5–5.1)
Potassium: 4.2 mEq/L (ref 3.5–5.1)
Sodium: 135 mEq/L (ref 135–145)
Sodium: 136 mEq/L (ref 135–145)

## 2010-10-05 LAB — D-DIMER, QUANTITATIVE: D-Dimer, Quant: 0.43 ug/mL-FEU (ref 0.00–0.48)

## 2010-10-05 LAB — COMPREHENSIVE METABOLIC PANEL
ALT: 19 U/L (ref 0–35)
AST: 20 U/L (ref 0–37)
Albumin: 4 g/dL (ref 3.5–5.2)
Alkaline Phosphatase: 71 U/L (ref 39–117)
BUN: 17 mg/dL (ref 6–23)
Chloride: 102 mEq/L (ref 96–112)
GFR calc Af Amer: 55 mL/min — ABNORMAL LOW (ref >60)
Potassium: 4.1 mEq/L (ref 3.5–5.1)
Sodium: 137 mEq/L (ref 135–145)
Total Bilirubin: 0.3 mg/dL (ref 0.3–1.2)
Total Protein: 6.7 g/dL (ref 6.0–8.3)

## 2010-10-05 LAB — CBC
HCT: 33.1 % — ABNORMAL LOW (ref 36.0–46.0)
HCT: 34.5 % — ABNORMAL LOW (ref 36.0–46.0)
Hemoglobin: 11.6 g/dL — ABNORMAL LOW (ref 12.0–15.0)
Hemoglobin: 11.9 g/dL — ABNORMAL LOW (ref 12.0–15.0)
MCHC: 34.3 g/dL (ref 30.0–36.0)
MCHC: 34.7 g/dL (ref 30.0–36.0)
MCHC: 34.9 g/dL (ref 30.0–36.0)
MCV: 95.1 fL (ref 78.0–100.0)
MCV: 96 fL (ref 78.0–100.0)
Platelets: 169 10*3/uL (ref 150–400)
RBC: 3.62 MIL/uL — ABNORMAL LOW (ref 3.87–5.11)
RDW: 13.5 % (ref 11.5–15.5)
RDW: 13.7 % (ref 11.5–15.5)
RDW: 13.9 % (ref 11.5–15.5)

## 2010-10-05 LAB — PROTIME-INR: Prothrombin Time: 13 seconds (ref 11.6–15.2)

## 2010-10-05 LAB — CARDIAC PANEL(CRET KIN+CKTOT+MB+TROPI)
CK, MB: 1.4 ng/mL (ref 0.3–4.0)
Relative Index: 1.4 (ref 0.0–2.5)
Relative Index: 1.6 (ref 0.0–2.5)
Total CK: 109 U/L (ref 7–177)
Troponin I: 0.01 ng/mL (ref 0.00–0.06)
Troponin I: 0.01 ng/mL (ref 0.00–0.06)

## 2010-10-05 LAB — LIPID PANEL
HDL: 33 mg/dL — ABNORMAL LOW (ref >39)
Total CHOL/HDL Ratio: 6.6 RATIO
VLDL: 59 mg/dL — ABNORMAL HIGH (ref 0–40)

## 2010-10-18 ENCOUNTER — Encounter: Payer: Self-pay | Admitting: Physician Assistant

## 2010-10-19 ENCOUNTER — Encounter: Payer: Self-pay | Admitting: Physician Assistant

## 2010-10-19 ENCOUNTER — Encounter: Payer: Self-pay | Admitting: *Deleted

## 2010-10-19 ENCOUNTER — Ambulatory Visit (INDEPENDENT_AMBULATORY_CARE_PROVIDER_SITE_OTHER): Payer: Medicare Other | Admitting: Physician Assistant

## 2010-10-19 VITALS — BP 126/76 | HR 64 | Ht 62.0 in | Wt 172.1 lb

## 2010-10-19 DIAGNOSIS — I4891 Unspecified atrial fibrillation: Secondary | ICD-10-CM

## 2010-10-19 DIAGNOSIS — R0602 Shortness of breath: Secondary | ICD-10-CM

## 2010-10-19 DIAGNOSIS — I2510 Atherosclerotic heart disease of native coronary artery without angina pectoris: Secondary | ICD-10-CM

## 2010-10-19 DIAGNOSIS — R072 Precordial pain: Secondary | ICD-10-CM

## 2010-10-19 LAB — CBC WITH DIFFERENTIAL/PLATELET
Basophils Absolute: 0 10*3/uL (ref 0.0–0.1)
HCT: 35.3 % — ABNORMAL LOW (ref 36.0–46.0)
Hemoglobin: 12.1 g/dL (ref 12.0–15.0)
Lymphs Abs: 0.9 10*3/uL (ref 0.7–4.0)
MCV: 93.1 fl (ref 78.0–100.0)
Monocytes Absolute: 0.4 10*3/uL (ref 0.1–1.0)
Monocytes Relative: 8.5 % (ref 3.0–12.0)
Neutro Abs: 2.7 10*3/uL (ref 1.4–7.7)
Platelets: 165 10*3/uL (ref 150.0–400.0)
RDW: 14.9 % — ABNORMAL HIGH (ref 11.5–14.6)

## 2010-10-19 LAB — PROTIME-INR: INR: 1.2 ratio — ABNORMAL HIGH (ref 0.8–1.0)

## 2010-10-19 LAB — BASIC METABOLIC PANEL
BUN: 17 mg/dL (ref 6–23)
CO2: 28 mEq/L (ref 19–32)
Calcium: 9.1 mg/dL (ref 8.4–10.5)
Chloride: 109 mEq/L (ref 96–112)
Creatinine, Ser: 1.1 mg/dL (ref 0.4–1.2)
Glucose, Bld: 79 mg/dL (ref 70–99)

## 2010-10-19 LAB — BRAIN NATRIURETIC PEPTIDE: Pro B Natriuretic peptide (BNP): 54.5 pg/mL (ref 0.0–100.0)

## 2010-10-19 MED ORDER — NITROGLYCERIN 0.4 MG SL SUBL: 0.4000 mg | SUBLINGUAL_TABLET | SUBLINGUAL | Status: DC | PRN

## 2010-10-19 MED ORDER — ASPIRIN EC 81 MG PO TBEC: 81.0000 mg | DELAYED_RELEASE_TABLET | Freq: Every day | ORAL | Status: DC

## 2010-10-19 NOTE — Assessment & Plan Note (Signed)
She really does not have any signs of volume overload on exam.  We will proceed with cardiac catheterization as noted.  I will also obtain a BNP with her labs today.

## 2010-10-19 NOTE — Assessment & Plan Note (Signed)
As noted, her chest discomfort is concerning for angina.  Her symptoms sound similar to what she had when she first presented.  She will be set up for cardiac catheterization as noted reviewed she will remain on aspirin and be given a prescription for nitroglycerin.  Continue statin therapy.

## 2010-10-19 NOTE — Assessment & Plan Note (Signed)
Symptoms concerning for angina.  Discussed with Dr. Johney Frame.  Will arrange outpatient cath.  Hold coumadin starting today.  Start ASA 81 mg qd.  NTG rx given.  Will check labs today.  Advised her to go to the ED if symptoms change or worsen. Risks and benefits of cardiac catheterization have been discussed with the patient.  These include bleeding, infection, kidney damage, stroke, heart attack, death.  The patient understands these risks and is willing to proceed.

## 2010-10-19 NOTE — Progress Notes (Signed)
History of Present Illness: Primary Electrophysiologist:  Dr. Hillis Range  [name] is a 65 y.o. female with a h/o CAD, s/p DES to the RCA in 04/2009, preserved LVF, paroxysmal Atrial Fibrillation, s/p RFCA in 08/2010, DM2, HTN, HLP.   She presents today with complaints of chest pain and shortness of breath.  She was concerned about lower extremity edema.  She came off of her hydrochlorothiazide after her ablation.  This was recently restarted.  She notes that her edema is worse at night and better in the mornings.  She denies orthopnea, PND.  She does note dyspnea with exertion.  She describes NYHA class IIb symptoms.  She does note left-sided chest discomfort.  She has had this off and on for over a year now.  However, over the last week it has gotten worse.  It is a tightness.  It feels as though it goes up into her left jaw.  She denies any radiation down her arm.  She has associated shortness of breath and sometimes nausea.  She denies syncope.  She sometimes has lightheadedness.  It generally occurs at rest.  She denies any chest discomfort with exertion.  She has not taken any nitroglycerin.  Her symptoms generally last about 10-15 minutes.  Her last episode was this morning while drinking coffee.  She denies any association with meals.  She denies dysphagia.  She denies belching or water brash symptoms.  Past Medical History  Diagnosis Date  . Diabetes mellitus   . Hyperlipidemia   . Coronary artery disease     a.  s/p Xience DES to RCA 04/2009;   b. cath 10/10: LAD 40%, CFX 30%, RCA 90%- tx with PCI, EF 60%;  c. echo 07/2009: EF 55-60%, mild MR;  d. TEE 2/12: EF 40%, Large PFO  . Hypertension   . Atrial fibrillation     s/p RFCA (pulmonary vein isolation) 08/2010 (Dr. Johney Frame)  . BCC (basal cell carcinoma of skin)     Current Outpatient Prescriptions  Medication Sig Dispense Refill  . docusate sodium (COLACE) 100 MG capsule Take 100 mg by mouth 2 (two) times daily as needed.        .  dronedarone (MULTAQ) 400 MG tablet Take 400 mg by mouth 2 (two) times daily with a meal.        . metFORMIN (GLUCOPHAGE-XR) 500 MG 24 hr tablet Take 500 mg by mouth daily with breakfast.        . metoprolol tartrate (LOPRESSOR) 25 MG tablet Take 25 mg by mouth 2 (two) times daily.        . nitroGLYCERIN (NITROSTAT) 0.4 MG SL tablet Place 0.4 mg under the tongue every 5 (five) minutes as needed.        . pantoprazole (PROTONIX) 40 MG tablet Take 40 mg by mouth daily.        . rosuvastatin (CRESTOR) 40 MG tablet Take 40 mg by mouth daily.        . traZODone (DESYREL) 50 MG tablet TAKE 2 TABS QHS PRN      . VITAMINS B1 B6 B12 IJ Inject as directed.        . warfarin (COUMADIN) 2.5 MG tablet Take by mouth as directed.          No Known Allergies  History  Substance Use Topics  . Smoking status: Former Games developer  . Smokeless tobacco: Not on file  . Alcohol Use: No     wine occasionally    Family  History  Problem Relation Age of Onset  . Heart attack Father   . Heart disease Sister     ROS:  Please see history of present illness.  She denies fevers, chills, cough, melena, hematochezia, hematuria.  All other systems reviewed and negative.   Vital Signs: BP 126/76  Pulse 64  Ht 5\' 2"  (1.575 m)  Wt 172 lb 1.9 oz (78.073 kg)  BMI 31.48 kg/m2  PHYSICAL EXAM: Well nourished, well developed, in no acute distress HEENT: normal Neck: no JVD Endocrine: no thyromegaly Vascular:  No Carotid bruits bilaterally; no femoral artery bruits bilaterally  Cardiac:  normal S1, S2; RRR; no murmur Lungs:  clear to auscultation bilaterally, no wheezing, rhonchi or rales Abd: soft, nontender, no hepatomegaly Ext: no edema Skin: warm and dry Neuro:  CNs 2-12 intact, no focal abnormalities noted Psych: Normal affect  EKG:  Sinus rhythm, heart rate 64, leftward axis, low voltage, poor R-wave progression, nonspecific ST-T wave changes  ASSESSMENT AND PLAN:

## 2010-10-19 NOTE — Patient Instructions (Signed)
Your physician has requested that you have a cardiac catheterization 10/24/10 @ 10:30 WITH DR. Clifton James SEE INSTRUCTION SHEET. Cardiac catheterization is used to diagnose and/or treat various heart conditions. Doctors may recommend this procedure for a number of different reasons. The most common reason is to evaluate chest pain. Chest pain can be a symptom of coronary artery disease (CAD), and cardiac catheterization can show whether plaque is narrowing or blocking your heart's arteries. This procedure is also used to evaluate the valves, as well as measure the blood flow and oxygen levels in different parts of your heart. For further information please visit [email or web address] Please follow instruction sheet, as given. PLEASE HOLD YOUR METFORMIN THE NIGHT BEFORE YOUR CATH, THE MORNING OF YOUR CATH AND FOR 48 HOURS AFTER YOUR CATH, THIS IS IMPORTANT SINCE THE DYE USED IN THE CATH CAN AFFECT YOUR KIDNEYS.  Your physician recommends that you return for lab work in: TODAY PRE CATH LAB PT, CBC W/DIFF, BMET AS WELL DRAW A BNP.  Your physician has recommended you make the following change in your medication: START 81 MG ASPIRIN TODAY AND HOLD YOUR COUMADIN UNTIL WE START YOU BACK.

## 2010-10-19 NOTE — Assessment & Plan Note (Signed)
Maintaining sinus rhythm.  She does have some palpitations.  She continues on Coumadin and Multaq.  Her Coumadin will be held for cardiac catheterization.

## 2010-10-24 ENCOUNTER — Inpatient Hospital Stay (HOSPITAL_BASED_OUTPATIENT_CLINIC_OR_DEPARTMENT_OTHER)
Admission: RE | Admit: 2010-10-24 | Discharge: 2010-10-24 | Disposition: A | Discharge status: Home / Self Care | Payer: Medicare Other | Source: Ambulatory Visit | Attending: Cardiovascular Disease | Admitting: Cardiovascular Disease

## 2010-10-24 ENCOUNTER — Encounter: Payer: Self-pay | Admitting: Internal Medicine

## 2010-10-24 DIAGNOSIS — I2510 Atherosclerotic heart disease of native coronary artery without angina pectoris: Secondary | ICD-10-CM

## 2010-10-24 DIAGNOSIS — R079 Chest pain, unspecified: Secondary | ICD-10-CM | POA: Insufficient documentation

## 2010-10-24 DIAGNOSIS — R0602 Shortness of breath: Secondary | ICD-10-CM | POA: Insufficient documentation

## 2010-10-24 DIAGNOSIS — E785 Hyperlipidemia, unspecified: Secondary | ICD-10-CM | POA: Insufficient documentation

## 2010-10-24 DIAGNOSIS — R609 Edema, unspecified: Secondary | ICD-10-CM | POA: Insufficient documentation

## 2010-10-24 DIAGNOSIS — Z9861 Coronary angioplasty status: Secondary | ICD-10-CM | POA: Insufficient documentation

## 2010-10-24 DIAGNOSIS — I10 Essential (primary) hypertension: Secondary | ICD-10-CM | POA: Insufficient documentation

## 2010-10-24 DIAGNOSIS — E119 Type 2 diabetes mellitus without complications: Secondary | ICD-10-CM | POA: Insufficient documentation

## 2010-10-25 ENCOUNTER — Ambulatory Visit: Payer: Medicare Other | Admitting: Internal Medicine

## 2010-10-25 NOTE — Cardiovascular Report (Signed)
NAMEMarland Kitchen  MIRABELLE, CYPHERS NO.:  [id number]  MEDICAL RECORD NO.:  [id number]           PATIENT TYPE:  LOCATION:                                 FACILITY:  PHYSICIAN:  Verne Carrow, MDDATE OF BIRTH:  1946/01/04  DATE OF PROCEDURE:  10/24/2010 DATE OF DISCHARGE:                           CARDIAC CATHETERIZATION   PRIMARY CARDIOLOGIST:  Hillis Range, MD  PROCEDURE PERFORMED: 1. Left heart catheterization. 2. Selective coronary angiography. 3. Left ventricular angiogram.  OPERATOR:  Verne Carrow, MD  INDICATIONS:  This is a 65 year old Caucasian female with a history of hypertension, hyperlipidemia, diabetes mellitus and coronary artery disease who had a Xience drug-eluting stent placed in the mid right coronary artery in October 2010 by Dr. Charlies Constable.  The patient also has a history of atrial fibrillation and has had atrial fibrillation ablation in February 2012.  The patient was recently seen by Tereso Newcomer in our Cardiology office.  She had complaints of shortness of breath with some chest discomfort.  She also complains of lower extremity edema.  She described chest discomfort that has been occurring on and off for over a year, however, worsened over the prior week. There was associated shortness of breath and occasional associated nausea.  Diagnostic catheterization was arranged for today.  PROCEDURE IN DETAIL:  The patient was brought to the outpatient cardiac catheterization laboratory after signing informed consent for the procedure.  The right groin was prepped and draped in a sterile fashion. Lidocaine 1% was used for local anesthesia.  A 4-French sheath was inserted into the right femoral artery without difficulty.  Standard diagnostic catheters were used to perform selective coronary angiography.  A pigtail catheter was used to perform a left ventricular angiogram.  The patient tolerated the procedure well.  There were  no immediate complications.  She was taken to the recovery area in stable condition.  HEMODYNAMIC FINDINGS:  Central aortic pressure 85/58.  Left ventricular pressure 77/26/20.  ANGIOGRAPHIC FINDINGS: 1. The left main coronary artery had no obstructive disease. 2. The left anterior descending was a large vessel that coursed to the     apex and gave off a moderate-sized diagonal branch.  The left     anterior descending artery had mild plaque disease throughout the     mid segment.  The diagonal branch had an ostial 30-40% stenosis.     None of the lesions appeared to be flow-limiting. 3. Circumflex artery was a moderate-sized vessel that gave off a small-     caliber obtuse marginal branch.  There were no flow-limiting     lesions noted in this vessel. 4. The right coronary artery is a moderate-sized vessel and has a     patent stent in the mid vessel with minimal in-stent restenosis.     Just beyond the stented segment, there appears to be a 50-60%     stenosis.  Multiple views were taken of this lesion and it did not     appear to be flow-limiting. 5. Left ventricular angiogram was performed in the RAO projection     showed normal left ventricular systolic function.  IMPRESSION: 1. Single-vessel coronary artery disease with patent stent in the mid     right coronary artery. 2. Moderate disease in the mid right coronary artery beyond the     stented segment. 3. Normal left ventricular systolic function.  RECOMMENDATIONS:  At this time, I would attempt medical management of this patient's coronary artery disease. I will start Imdur 15 mg po Qdaily.  If she has recurrent chest discomfort that could not be managed with medications, then I would recommend a stress Myoview to define if there is any ischemia in the inferior wall.  The right coronary artery is a small-caliber vessel beyond the area of moderate stenosis.     Verne Carrow, MD     CM/MEDQ  D:  10/24/2010   T:  10/24/2010  Job:  [id number]  cc:   Hillis Range, MD  Electronically Signed by Verne Carrow MD on 10/25/2010 05:36:26 PM

## 2010-11-02 ENCOUNTER — Ambulatory Visit (INDEPENDENT_AMBULATORY_CARE_PROVIDER_SITE_OTHER): Payer: Medicare Other | Admitting: *Deleted

## 2010-11-02 DIAGNOSIS — I4891 Unspecified atrial fibrillation: Secondary | ICD-10-CM

## 2010-11-16 ENCOUNTER — Ambulatory Visit (INDEPENDENT_AMBULATORY_CARE_PROVIDER_SITE_OTHER): Payer: Medicare Other | Admitting: *Deleted

## 2010-11-16 DIAGNOSIS — I4891 Unspecified atrial fibrillation: Secondary | ICD-10-CM

## 2010-11-20 ENCOUNTER — Ambulatory Visit (INDEPENDENT_AMBULATORY_CARE_PROVIDER_SITE_OTHER): Payer: Medicare Other | Admitting: Internal Medicine

## 2010-11-20 ENCOUNTER — Encounter: Payer: Self-pay | Admitting: Internal Medicine

## 2010-11-20 DIAGNOSIS — I4891 Unspecified atrial fibrillation: Secondary | ICD-10-CM

## 2010-11-20 DIAGNOSIS — I2510 Atherosclerotic heart disease of native coronary artery without angina pectoris: Secondary | ICD-10-CM

## 2010-11-20 MED ORDER — ISOSORBIDE DINITRATE 30 MG PO TABS: ORAL_TABLET | ORAL | Status: DC

## 2010-11-20 MED ORDER — LISINOPRIL 10 MG PO TABS: 10.0000 mg | ORAL_TABLET | Freq: Every day | ORAL | Status: DC

## 2010-11-20 NOTE — Assessment & Plan Note (Signed)
Stable Continue HCTZ Stop multaq and monitor

## 2010-11-20 NOTE — Patient Instructions (Addendum)
Your physician recommends that you schedule a follow-up appointment in: 3 months with Dr Allred  Your physician has recommended you make the following change in your medication: stop Multaq    

## 2010-11-20 NOTE — Progress Notes (Signed)
The patient presents today for routine electrophysiology followup.  Since last being seen in our clinic, the patient reports doing well.  She denies any further episodes of atrial fibrillation.  She is quite pleased with the results.  She reported angina in April for which she had repeat cath.  Medical therapy was advised and she was placed on imdur.  She has had no further chest pain since that time.  Her edema and SOB have also begun to improve.  Today, she denies symptoms ofrthopnea, PND, dizziness, presyncope, syncope, or neurologic sequela.  The patient feels that she is tolerating medications without difficulties and is otherwise without complaint today.   Past Medical History  Diagnosis Date  . Diabetes mellitus   . Hyperlipidemia   . Coronary artery disease     a.  s/p Xience DES to RCA 04/2009;   b. cath 10/10: LAD 40%, CFX 30%, RCA 90%- tx with PCI, EF 60%;  c. echo 07/2009: EF 55-60%, mild MR;  d. TEE 2/12: EF 40%, Large PFO  . Hypertension   . Atrial fibrillation     s/p RFCA (pulmonary vein isolation) 08/2010 (Dr. Johney Frame)  . 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 Total abdominal hysterectomy   . Appendectomy   . Cholecystectomy   . Spinal cyst removed   . Coronary stent placement   . Atrial fibrillation ablation     by [facility] 2/12    Current Outpatient Prescriptions  Medication Sig Dispense Refill  . aspirin EC 81 MG EC tablet Take 1 tablet (81 mg total) by mouth daily.  150 tablet  2  . docusate sodium (COLACE) 100 MG capsule Take 100 mg by mouth 2 (two) times daily as needed.        . dronedarone (MULTAQ) 400 MG tablet Take 400 mg by mouth 2 (two) times daily with a meal.        . hydrochlorothiazide 25 MG tablet Take 25 mg by mouth daily.        . isosorbide dinitrate (ISORDIL) 30 MG tablet Take 15 mg by mouth 2 (two) times daily.        . metFORMIN (GLUCOPHAGE-XR) 500 MG 24 hr tablet Take 500 mg by mouth daily with breakfast.         . metoprolol tartrate (LOPRESSOR) 25 MG tablet Take 25 mg by mouth 2 (two) times daily.        . nitroGLYCERIN (NITROSTAT) 0.4 MG SL tablet Place 1 tablet (0.4 mg total) under the tongue every 5 (five) minutes as needed.  25 tablet  3  . pantoprazole (PROTONIX) 40 MG tablet Take 40 mg by mouth daily.        . rosuvastatin (CRESTOR) 40 MG tablet Take 40 mg by mouth daily.        . traZODone (DESYREL) 50 MG tablet TAKE 2 TABS QHS PRN      . VITAMINS B1 B6 B12 IJ Inject as directed.        . warfarin (COUMADIN) 2.5 MG tablet as directed.         No Known Allergies  History   Social History  . Marital Status: Married    Spouse Name: N/A    Number of Children: N/A  . Years of Education: N/A   Occupational History  . retired    Social History Main Topics  . Smoking status: Former Smoker -- 1.0 packs/day for  20 years    Quit date: 07/02/1989  . Smokeless tobacco: Not on file  . Alcohol Use: 0.0 oz/week     wine occasionally  . Drug Use: No  . Sexually Active: Not on file   Other Topics Concern  . Not on file   Social History Narrative  . No narrative on file    Family History  Problem Relation Age of Onset  . Coronary artery disease Father   . Heart disease Sister    Physical Exam: Filed Vitals:   11/20/10 1159  BP: 110/60  Pulse: 82  Height: 5\' 2"  (1.575 m)  Weight: 170 lb (77.111 kg)    GEN- The patient is well appearing, alert and oriented x 3 today.   Head- normocephalic, atraumatic Eyes-  Sclera clear, conjunctiva pink Ears- hearing intact Oropharynx- clear Neck- supple, no JVP Lymph- no cervical lymphadenopathy Lungs- Clear to ausculation bilaterally, normal work of breathing Heart- Regular rate and rhythm, no murmurs, rubs or gallops, PMI not laterally displaced GI- soft, NT, ND, + BS Extremities- no clubbing, cyanosis, or edema MS- no significant deformity or atrophy Skin- no rash or lesion Psych- euthymic mood, full affect Neuro- strength and  sensation are intact  ekg today reveals sinus rhythm 77 bpm, otherwise normal ekg  Assessment and Plan:

## 2010-11-20 NOTE — Assessment & Plan Note (Signed)
Stable No change required today  

## 2010-11-20 NOTE — Assessment & Plan Note (Signed)
Doing very well s/p ablation without recurrence Stop multaq today Continue coumadin. Return in 3 months

## 2010-11-20 NOTE — Assessment & Plan Note (Signed)
Stable without further symptoms s/p starting nitrates Continue ASA No changes today

## 2010-11-21 ENCOUNTER — Other Ambulatory Visit: Payer: Self-pay | Admitting: Internal Medicine

## 2010-12-04 ENCOUNTER — Encounter: Payer: Medicare Other | Admitting: Internal Medicine

## 2010-12-14 ENCOUNTER — Ambulatory Visit (INDEPENDENT_AMBULATORY_CARE_PROVIDER_SITE_OTHER): Payer: Medicare Other | Admitting: *Deleted

## 2010-12-14 DIAGNOSIS — I4891 Unspecified atrial fibrillation: Secondary | ICD-10-CM

## 2010-12-14 LAB — POCT INR: INR: 3.4

## 2010-12-28 ENCOUNTER — Ambulatory Visit (INDEPENDENT_AMBULATORY_CARE_PROVIDER_SITE_OTHER): Payer: Medicare Other | Admitting: *Deleted

## 2010-12-28 DIAGNOSIS — I4891 Unspecified atrial fibrillation: Secondary | ICD-10-CM

## 2011-01-18 ENCOUNTER — Ambulatory Visit (INDEPENDENT_AMBULATORY_CARE_PROVIDER_SITE_OTHER): Payer: Medicare Other | Admitting: *Deleted

## 2011-01-18 DIAGNOSIS — I4891 Unspecified atrial fibrillation: Secondary | ICD-10-CM

## 2011-01-18 LAB — POCT INR: INR: 2.9

## 2011-01-21 ENCOUNTER — Other Ambulatory Visit: Payer: Self-pay | Admitting: Internal Medicine

## 2011-01-22 ENCOUNTER — Other Ambulatory Visit: Payer: Self-pay | Admitting: Internal Medicine

## 2011-01-22 NOTE — Telephone Encounter (Signed)
Rx refill request

## 2011-02-15 ENCOUNTER — Encounter: Payer: Medicare Other | Admitting: *Deleted

## 2011-02-15 ENCOUNTER — Ambulatory Visit (INDEPENDENT_AMBULATORY_CARE_PROVIDER_SITE_OTHER): Payer: Medicare Other | Admitting: *Deleted

## 2011-02-15 DIAGNOSIS — I4891 Unspecified atrial fibrillation: Secondary | ICD-10-CM

## 2011-02-15 LAB — POCT INR: INR: 2.1

## 2011-02-22 ENCOUNTER — Encounter: Payer: Self-pay | Admitting: Internal Medicine

## 2011-02-22 ENCOUNTER — Ambulatory Visit (INDEPENDENT_AMBULATORY_CARE_PROVIDER_SITE_OTHER): Payer: Medicare Other | Admitting: Internal Medicine

## 2011-02-22 VITALS — BP 100/68 | HR 84 | Ht 62.0 in | Wt 169.4 lb

## 2011-02-22 DIAGNOSIS — I10 Essential (primary) hypertension: Secondary | ICD-10-CM

## 2011-02-22 DIAGNOSIS — I4891 Unspecified atrial fibrillation: Secondary | ICD-10-CM

## 2011-02-22 DIAGNOSIS — I2510 Atherosclerotic heart disease of native coronary artery without angina pectoris: Secondary | ICD-10-CM

## 2011-02-22 NOTE — Assessment & Plan Note (Signed)
Stable without angina 

## 2011-02-22 NOTE — Patient Instructions (Signed)
Your physician wants you to follow-up in: 6 months with Dr. Allred. You will receive a reminder letter in the mail two months in advance. If you don't receive a letter, please call our office to schedule the follow-up appointment.  

## 2011-02-22 NOTE — Progress Notes (Signed)
Addended by: Erin Hearing on: 02/22/2011 01:35 PM   Modules accepted: Orders

## 2011-02-22 NOTE — Assessment & Plan Note (Signed)
BP is low today She reports recent orthostatic dizziness  Decrease HCTZ to 12.5mg  daily at this time.

## 2011-02-22 NOTE — Assessment & Plan Note (Signed)
Doing well off AAD Continue coumadin for CHADSVASC score of 4. Stop ASA to reduce long term risks of bleeding

## 2011-02-22 NOTE — Progress Notes (Signed)
The patient presents today for routine electrophysiology followup.  Since last being seen in our clinic, the patient reports doing well.  She has been without any further atrial fibrillation.   She denies chest pain or edema.  Her SOB has improved.  She has been noticing postural dizziness recently.  Today, she denies symptoms of orthopnea, PND,  presyncope, syncope, or neurologic sequela.  The patient feels that she is tolerating medications without difficulties and is otherwise without complaint today.   Past Medical History  Diagnosis Date  . Diabetes mellitus   . Hyperlipidemia   . Coronary artery disease     a.  s/p Xience DES to RCA 04/2009;   b. cath 10/10: LAD 40%, CFX 30%, RCA 90%- tx with PCI, EF 60%;  c. echo 07/2009: EF 55-60%, mild MR;  d. TEE 2/12: EF 40%, Large PFO  . Hypertension   . Atrial fibrillation     s/p RFCA (pulmonary vein isolation) 08/2010 (Dr. Johney Frame)  . 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 Total abdominal hysterectomy   . Appendectomy   . Cholecystectomy   . Spinal cyst removed   . Coronary stent placement   . Atrial fibrillation ablation     by [facility] 2/12    Current Outpatient Prescriptions  Medication Sig Dispense Refill  . aspirin EC 81 MG EC tablet Take 1 tablet (81 mg total) by mouth daily.  150 tablet  2  . docusate sodium (COLACE) 100 MG capsule Take 100 mg by mouth 2 (two) times daily as needed.        . hydrochlorothiazide 25 MG tablet TAKE 1 TABLET BY MOUTH DAILY  90 tablet  2  . isosorbide dinitrate (ISORDIL) 30 MG tablet Take 1/2 tablet daily  90 tablet  3  . lisinopril (PRINIVIL,ZESTRIL) 10 MG tablet Take 1 tablet (10 mg total) by mouth daily.  90 tablet  3  . metFORMIN (GLUCOPHAGE-XR) 500 MG 24 hr tablet Take 500 mg by mouth daily with breakfast.        . metoprolol tartrate (LOPRESSOR) 25 MG tablet Take 25 mg by mouth 2 (two) times daily.        . nitroGLYCERIN (NITROSTAT) 0.4 MG SL tablet Place  1 tablet (0.4 mg total) under the tongue every 5 (five) minutes as needed.  25 tablet  3  . pantoprazole (PROTONIX) 40 MG tablet Take 40 mg by mouth daily.        . rosuvastatin (CRESTOR) 40 MG tablet Take 40 mg by mouth daily.        . traZODone (DESYREL) 50 MG tablet TAKE 2 TABS QHS PRN      . VITAMINS B1 B6 B12 IJ Inject as directed.        . warfarin (COUMADIN) 2.5 MG tablet TAKE AS DIRECTED BY COUMADIN CLINIC UP TO 16 TABLETS EACH WEEK  190 tablet  1    No Known Allergies  History   Social History  . Marital Status: Married    Spouse Name: N/A    Number of Children: N/A  . Years of Education: N/A   Occupational History  . retired    Social History Main Topics  . Smoking status: Former Smoker -- 1.0 packs/day for 20 years    Quit date: 07/02/1989  . Smokeless tobacco: Not on file  . Alcohol Use: 0.0 oz/week     wine occasionally  . Drug Use:  No  . Sexually Active: Not on file   Other Topics Concern  . Not on file   Social History Narrative  . No narrative on file    Family History  Problem Relation Age of Onset  . Coronary artery disease Father   . Heart disease Sister    Physical Exam: Filed Vitals:   02/22/11 1017  BP: 100/68  Pulse: 84  Height: 5\' 2"  (1.575 m)  Weight: 169 lb 6.4 oz (76.839 kg)    GEN- The patient is well appearing, alert and oriented x 3 today.   Head- normocephalic, atraumatic Eyes-  Sclera clear, conjunctiva pink Ears- hearing intact Oropharynx- clear Neck- supple, no JVP Lymph- no cervical lymphadenopathy Lungs- Clear to ausculation bilaterally, normal work of breathing Heart- Regular rate and rhythm, no murmurs, rubs or gallops, PMI not laterally displaced GI- soft, NT, ND, + BS Extremities- no clubbing, cyanosis, or edema MS- no significant deformity or atrophy Skin- no rash or lesion Psych- euthymic mood, full affect Neuro- strength and sensation are intact  ekg today reveals sinus rhythm 84 bpm, otherwise normal  ekg  Assessment and Plan:

## 2011-03-15 ENCOUNTER — Ambulatory Visit (INDEPENDENT_AMBULATORY_CARE_PROVIDER_SITE_OTHER): Payer: Medicare Other | Admitting: *Deleted

## 2011-03-15 DIAGNOSIS — I4891 Unspecified atrial fibrillation: Secondary | ICD-10-CM

## 2011-03-23 ENCOUNTER — Telehealth: Payer: Self-pay | Admitting: *Deleted

## 2011-03-23 NOTE — Telephone Encounter (Signed)
Letter received from Dr Leeanne Deed asking if we could hold Coumadin for 5 days prior to bunion surgery.  Dr Johney Frame reviewed, ok per him to hold Coumadin for 5 days.  Left message on [facility] nurse line with information.

## 2011-03-27 ENCOUNTER — Ambulatory Visit (INDEPENDENT_AMBULATORY_CARE_PROVIDER_SITE_OTHER): Payer: Medicare Other | Admitting: *Deleted

## 2011-03-27 DIAGNOSIS — I4891 Unspecified atrial fibrillation: Secondary | ICD-10-CM

## 2011-04-10 ENCOUNTER — Ambulatory Visit (INDEPENDENT_AMBULATORY_CARE_PROVIDER_SITE_OTHER): Payer: Medicare Other | Admitting: *Deleted

## 2011-04-10 DIAGNOSIS — I4891 Unspecified atrial fibrillation: Secondary | ICD-10-CM

## 2011-04-27 ENCOUNTER — Ambulatory Visit (INDEPENDENT_AMBULATORY_CARE_PROVIDER_SITE_OTHER): Payer: Medicare Other | Admitting: *Deleted

## 2011-04-27 DIAGNOSIS — I4891 Unspecified atrial fibrillation: Secondary | ICD-10-CM

## 2011-04-27 LAB — POCT INR: INR: 1.9

## 2011-05-07 ENCOUNTER — Ambulatory Visit (INDEPENDENT_AMBULATORY_CARE_PROVIDER_SITE_OTHER): Payer: Medicare Other | Admitting: *Deleted

## 2011-05-07 DIAGNOSIS — I4891 Unspecified atrial fibrillation: Secondary | ICD-10-CM

## 2011-05-11 ENCOUNTER — Encounter: Payer: Medicare Other | Admitting: *Deleted

## 2011-06-04 ENCOUNTER — Ambulatory Visit (INDEPENDENT_AMBULATORY_CARE_PROVIDER_SITE_OTHER): Payer: Medicare Other | Admitting: *Deleted

## 2011-06-04 DIAGNOSIS — I4891 Unspecified atrial fibrillation: Secondary | ICD-10-CM

## 2011-06-04 LAB — POCT INR: INR: 2.6

## 2011-07-02 ENCOUNTER — Ambulatory Visit (INDEPENDENT_AMBULATORY_CARE_PROVIDER_SITE_OTHER): Payer: Medicare Other | Admitting: *Deleted

## 2011-07-02 DIAGNOSIS — I4891 Unspecified atrial fibrillation: Secondary | ICD-10-CM

## 2011-07-02 LAB — POCT INR: INR: 2.3

## 2011-07-03 HISTORY — PX: BUNIONECTOMY: SHX129

## 2011-07-05 ENCOUNTER — Other Ambulatory Visit: Payer: Self-pay | Admitting: Internal Medicine

## 2011-07-05 MED ORDER — PANTOPRAZOLE SODIUM 40 MG PO TBEC: 40.0000 mg | DELAYED_RELEASE_TABLET | Freq: Every day | ORAL | Status: DC

## 2011-07-30 ENCOUNTER — Other Ambulatory Visit: Payer: Self-pay | Admitting: Pharmacist

## 2011-07-30 ENCOUNTER — Encounter: Payer: Medicare Other | Admitting: *Deleted

## 2011-07-30 MED ORDER — WARFARIN SODIUM 2.5 MG PO TABS: ORAL_TABLET | ORAL | Status: DC

## 2011-07-31 ENCOUNTER — Encounter: Payer: Medicare Other | Admitting: *Deleted

## 2011-08-01 ENCOUNTER — Ambulatory Visit (INDEPENDENT_AMBULATORY_CARE_PROVIDER_SITE_OTHER): Payer: Medicare Other | Admitting: *Deleted

## 2011-08-01 DIAGNOSIS — I4891 Unspecified atrial fibrillation: Secondary | ICD-10-CM

## 2011-08-01 LAB — POCT INR: INR: 3.1

## 2011-08-22 ENCOUNTER — Ambulatory Visit (INDEPENDENT_AMBULATORY_CARE_PROVIDER_SITE_OTHER): Payer: Medicare Other | Admitting: Pharmacist

## 2011-08-22 DIAGNOSIS — I4891 Unspecified atrial fibrillation: Secondary | ICD-10-CM

## 2011-08-22 DIAGNOSIS — Z7901 Long term (current) use of anticoagulants: Secondary | ICD-10-CM

## 2011-08-29 ENCOUNTER — Encounter: Payer: Self-pay | Admitting: Internal Medicine

## 2011-08-29 ENCOUNTER — Ambulatory Visit (INDEPENDENT_AMBULATORY_CARE_PROVIDER_SITE_OTHER): Payer: Medicare Other | Admitting: Internal Medicine

## 2011-08-29 VITALS — BP 118/64 | HR 84 | Ht 63.0 in | Wt 176.4 lb

## 2011-08-29 DIAGNOSIS — I2510 Atherosclerotic heart disease of native coronary artery without angina pectoris: Secondary | ICD-10-CM

## 2011-08-29 DIAGNOSIS — I4891 Unspecified atrial fibrillation: Secondary | ICD-10-CM

## 2011-08-29 DIAGNOSIS — I10 Essential (primary) hypertension: Secondary | ICD-10-CM

## 2011-08-29 NOTE — Progress Notes (Signed)
The patient presents today for routine electrophysiology followup.  Since last being seen in our clinic, the patient reports doing well.  She has been without any further atrial fibrillation.   She denies chest pain or edema.  Her SOB has improved and dizziness has resolved.  Today, she denies symptoms of orthopnea, PND,  presyncope, syncope, or neurologic sequela.  The patient feels that she is tolerating medications without difficulties and is otherwise without complaint today.   Past Medical History  Diagnosis Date  . Diabetes mellitus   . Hyperlipidemia   . Coronary artery disease     a.  s/p Xience DES to RCA 04/2009;   b. cath 10/10: LAD 40%, CFX 30%, RCA 90%- tx with PCI, EF 60%;  c. echo 07/2009: EF 55-60%, mild MR;  d. TEE 2/12: EF 40%, Large PFO  . Hypertension   . Atrial fibrillation     s/p RFCA (pulmonary vein isolation) 08/2010 (Dr. Johney Frame)  . 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 Total abdominal hysterectomy   . Appendectomy   . Cholecystectomy   . Spinal cyst removed   . Coronary stent placement   . Atrial fibrillation ablation     by [facility] 2/12    Current Outpatient Prescriptions  Medication Sig Dispense Refill  . nitroGLYCERIN (NITROSTAT) 0.4 MG SL tablet Place 1 tablet (0.4 mg total) under the tongue every 5 (five) minutes as needed.  25 tablet  3  . pantoprazole (PROTONIX) 40 MG tablet Take 1 tablet (40 mg total) by mouth daily.  90 tablet  1  . potassium chloride (KLOR-CON) 10 MEQ CR tablet Take 10 mEq by mouth daily. Takes 2 tablets in the morning       . rosuvastatin (CRESTOR) 40 MG tablet Take 40 mg by mouth daily.        . traZODone (DESYREL) 50 MG tablet TAKE 2 TABS QHS PRN      . VITAMINS B1 B6 B12 IJ Inject as directed.        . warfarin (COUMADIN) 2.5 MG tablet Take as directed by Anticoagulation clinic.  Pt takes up to 16 tablets each week.  190 tablet  1    No Known Allergies  History   Social History    . Marital Status: Married    Spouse Name: N/A    Number of Children: N/A  . Years of Education: N/A   Occupational History  . retired    Social History Main Topics  . Smoking status: Former Smoker -- 1.0 packs/day for 20 years    Quit date: 07/02/1989  . Smokeless tobacco: Not on file  . Alcohol Use: 0.0 oz/week     wine occasionally  . Drug Use: No  . Sexually Active: Not on file   Other Topics Concern  . Not on file   Social History Narrative  . No narrative on file    Family History  Problem Relation Age of Onset  . Coronary artery disease Father   . Heart disease Sister    Physical Exam: Filed Vitals:   08/29/11 1424  BP: 118/64  Pulse: 84  Height: 5\' 3"  (1.6 m)  Weight: 176 lb 6.4 oz (80.015 kg)    GEN- The patient is well appearing, alert and oriented x 3 today.   Head- normocephalic, atraumatic Eyes-  Sclera clear, conjunctiva pink Ears- hearing intact Oropharynx- clear Neck- supple, no JVP Lymph- no  cervical lymphadenopathy Lungs- Clear to ausculation bilaterally, normal work of breathing Heart- Regular rate and rhythm, no murmurs, rubs or gallops, PMI not laterally displaced GI- soft, NT, ND, + BS Extremities- no clubbing, cyanosis, or edema MS- no significant deformity or atrophy Skin- no rash or lesion Psych- euthymic mood, full affect Neuro- strength and sensation are intact  ekg today reveals sinus rhythm 84 bpm, otherwise normal ekg  Assessment and Plan:

## 2011-08-29 NOTE — Assessment & Plan Note (Signed)
No ischemic symptoms No changes 

## 2011-08-29 NOTE — Assessment & Plan Note (Addendum)
Controlled without antihypertensive drugs  Obtain echo

## 2011-08-29 NOTE — Assessment & Plan Note (Signed)
Doing well off AAD Continue coumadin for CHADSVASC score of 4. Goal INR 2-3

## 2011-08-29 NOTE — Patient Instructions (Signed)
Your physician has requested that you have an echocardiogram. Echocardiography is a painless test that uses sound waves to create images of your heart. It provides your doctor with information about the size and shape of your heart and how well your heart's chambers and valves are working. This procedure takes approximately one hour. There are no restrictions for this procedure.  Your physician wants you to follow-up in: 12 months You will receive a reminder letter in the mail two months in advance. If you don't receive a letter, please call our office to schedule the follow-up appointment.  

## 2011-09-12 ENCOUNTER — Other Ambulatory Visit (HOSPITAL_COMMUNITY): Payer: Medicare Other

## 2011-09-19 ENCOUNTER — Ambulatory Visit (INDEPENDENT_AMBULATORY_CARE_PROVIDER_SITE_OTHER): Payer: Medicare Other | Admitting: *Deleted

## 2011-09-19 ENCOUNTER — Ambulatory Visit (HOSPITAL_COMMUNITY): Payer: Medicare Other | Attending: Internal Medicine

## 2011-09-19 ENCOUNTER — Other Ambulatory Visit: Payer: Self-pay

## 2011-09-19 DIAGNOSIS — I4891 Unspecified atrial fibrillation: Secondary | ICD-10-CM | POA: Insufficient documentation

## 2011-09-19 DIAGNOSIS — E785 Hyperlipidemia, unspecified: Secondary | ICD-10-CM | POA: Insufficient documentation

## 2011-09-19 DIAGNOSIS — Z7901 Long term (current) use of anticoagulants: Secondary | ICD-10-CM

## 2011-09-19 DIAGNOSIS — I10 Essential (primary) hypertension: Secondary | ICD-10-CM | POA: Insufficient documentation

## 2011-09-19 DIAGNOSIS — I2510 Atherosclerotic heart disease of native coronary artery without angina pectoris: Secondary | ICD-10-CM

## 2011-09-19 DIAGNOSIS — E119 Type 2 diabetes mellitus without complications: Secondary | ICD-10-CM | POA: Insufficient documentation

## 2011-09-19 LAB — POCT INR: INR: 3.3

## 2011-10-10 ENCOUNTER — Ambulatory Visit (INDEPENDENT_AMBULATORY_CARE_PROVIDER_SITE_OTHER): Payer: Medicare Other | Admitting: *Deleted

## 2011-10-10 DIAGNOSIS — Z7901 Long term (current) use of anticoagulants: Secondary | ICD-10-CM

## 2011-10-10 DIAGNOSIS — I4891 Unspecified atrial fibrillation: Secondary | ICD-10-CM

## 2011-10-31 ENCOUNTER — Ambulatory Visit (INDEPENDENT_AMBULATORY_CARE_PROVIDER_SITE_OTHER): Payer: Medicare Other | Admitting: Pharmacist

## 2011-10-31 DIAGNOSIS — Z7901 Long term (current) use of anticoagulants: Secondary | ICD-10-CM

## 2011-10-31 DIAGNOSIS — I4891 Unspecified atrial fibrillation: Secondary | ICD-10-CM

## 2011-10-31 LAB — POCT INR: INR: 4.1

## 2011-11-05 ENCOUNTER — Other Ambulatory Visit: Payer: Self-pay | Admitting: *Deleted

## 2011-11-05 MED ORDER — PANTOPRAZOLE SODIUM 40 MG PO TBEC: 40.0000 mg | DELAYED_RELEASE_TABLET | Freq: Every day | ORAL | Status: DC

## 2011-11-05 NOTE — Telephone Encounter (Signed)
REFILLED PANTOPRAZOLE

## 2011-11-14 ENCOUNTER — Ambulatory Visit (INDEPENDENT_AMBULATORY_CARE_PROVIDER_SITE_OTHER): Payer: Medicare Other | Admitting: *Deleted

## 2011-11-14 DIAGNOSIS — Z7901 Long term (current) use of anticoagulants: Secondary | ICD-10-CM

## 2011-11-14 DIAGNOSIS — I4891 Unspecified atrial fibrillation: Secondary | ICD-10-CM

## 2011-11-14 LAB — POCT INR: INR: 2.7

## 2011-11-23 ENCOUNTER — Ambulatory Visit (INDEPENDENT_AMBULATORY_CARE_PROVIDER_SITE_OTHER): Payer: Medicare Other | Admitting: Pharmacist

## 2011-11-23 DIAGNOSIS — I4891 Unspecified atrial fibrillation: Secondary | ICD-10-CM

## 2011-11-23 DIAGNOSIS — Z7901 Long term (current) use of anticoagulants: Secondary | ICD-10-CM

## 2011-12-14 ENCOUNTER — Ambulatory Visit (INDEPENDENT_AMBULATORY_CARE_PROVIDER_SITE_OTHER): Payer: Medicare Other

## 2011-12-14 DIAGNOSIS — Z7901 Long term (current) use of anticoagulants: Secondary | ICD-10-CM

## 2011-12-14 DIAGNOSIS — I4891 Unspecified atrial fibrillation: Secondary | ICD-10-CM

## 2012-01-11 ENCOUNTER — Ambulatory Visit (INDEPENDENT_AMBULATORY_CARE_PROVIDER_SITE_OTHER): Payer: Medicare Other

## 2012-01-11 DIAGNOSIS — I4891 Unspecified atrial fibrillation: Secondary | ICD-10-CM

## 2012-01-11 DIAGNOSIS — Z7901 Long term (current) use of anticoagulants: Secondary | ICD-10-CM

## 2012-01-29 ENCOUNTER — Other Ambulatory Visit: Payer: Self-pay | Admitting: *Deleted

## 2012-01-29 MED ORDER — WARFARIN SODIUM 2.5 MG PO TABS: ORAL_TABLET | ORAL | Status: DC

## 2012-01-30 ENCOUNTER — Other Ambulatory Visit: Payer: Self-pay

## 2012-01-31 MED ORDER — LISINOPRIL 10 MG PO TABS: 10.0000 mg | ORAL_TABLET | Freq: Every day | ORAL | Status: DC

## 2012-02-01 ENCOUNTER — Ambulatory Visit (INDEPENDENT_AMBULATORY_CARE_PROVIDER_SITE_OTHER): Payer: Medicare Other | Admitting: Pharmacist

## 2012-02-01 DIAGNOSIS — Z7901 Long term (current) use of anticoagulants: Secondary | ICD-10-CM

## 2012-02-01 DIAGNOSIS — I4891 Unspecified atrial fibrillation: Secondary | ICD-10-CM

## 2012-02-15 ENCOUNTER — Ambulatory Visit (INDEPENDENT_AMBULATORY_CARE_PROVIDER_SITE_OTHER): Payer: Medicare Other | Admitting: Pharmacist

## 2012-02-15 DIAGNOSIS — I4891 Unspecified atrial fibrillation: Secondary | ICD-10-CM

## 2012-02-15 DIAGNOSIS — Z7901 Long term (current) use of anticoagulants: Secondary | ICD-10-CM

## 2012-03-07 ENCOUNTER — Ambulatory Visit (INDEPENDENT_AMBULATORY_CARE_PROVIDER_SITE_OTHER): Payer: Medicare Other

## 2012-03-07 DIAGNOSIS — I4891 Unspecified atrial fibrillation: Secondary | ICD-10-CM

## 2012-03-07 DIAGNOSIS — Z7901 Long term (current) use of anticoagulants: Secondary | ICD-10-CM

## 2012-03-07 LAB — POCT INR: INR: 1.7

## 2012-03-28 ENCOUNTER — Encounter: Payer: Self-pay | Admitting: Internal Medicine

## 2012-03-28 ENCOUNTER — Ambulatory Visit (INDEPENDENT_AMBULATORY_CARE_PROVIDER_SITE_OTHER): Payer: Medicare Other | Admitting: *Deleted

## 2012-03-28 ENCOUNTER — Ambulatory Visit (INDEPENDENT_AMBULATORY_CARE_PROVIDER_SITE_OTHER): Payer: Medicare Other | Admitting: Internal Medicine

## 2012-03-28 VITALS — BP 113/68 | HR 73 | Ht 62.0 in | Wt 174.0 lb

## 2012-03-28 DIAGNOSIS — Z7901 Long term (current) use of anticoagulants: Secondary | ICD-10-CM

## 2012-03-28 DIAGNOSIS — I4891 Unspecified atrial fibrillation: Secondary | ICD-10-CM

## 2012-03-28 DIAGNOSIS — I2510 Atherosclerotic heart disease of native coronary artery without angina pectoris: Secondary | ICD-10-CM

## 2012-03-28 DIAGNOSIS — I10 Essential (primary) hypertension: Secondary | ICD-10-CM

## 2012-03-28 MED ORDER — METOPROLOL SUCCINATE ER 25 MG PO TB24: ORAL_TABLET | ORAL | Status: DC

## 2012-03-28 NOTE — Assessment & Plan Note (Signed)
Chest tightness associated with palpitations Add toprol Monitor as above

## 2012-03-28 NOTE — Assessment & Plan Note (Signed)
Stable No change required today  

## 2012-03-28 NOTE — Patient Instructions (Addendum)
Your physician recommends that you schedule a follow-up appointment in: 4 weeks with Dr Johney Frame  Your physician has recommended you make the following change in your medication:  1) Start Toprol 25mg  daily  Your physician has recommended that you wear a holter monitor. Holter monitors are medical devices that record the heart's electrical activity. Doctors most often use these monitors to diagnose arrhythmias. Arrhythmias are problems with the speed or rhythm of the heartbeat. The monitor is a small, portable device. You can wear one while you do your normal daily activities. This is usually used to diagnose what is causing palpitations/syncope (passing out).---48hour  ACT EX  If you catch arrythmia within first 48 hours okay to stop wearing if not wear for month

## 2012-03-28 NOTE — Progress Notes (Signed)
PCP:  Dalbert Mayotte, MD  The patient presents today for electrophysiology followup.  Since last being seen in our clinic, the patient reports doing very well.   She has been without symptomatic afib for a year and a half.   Over the past month, she reports symptoms of irregular palpitations with associated with chest tightness.  These episodes occur most days in the morning, lasting 20 minutes or so.   Today, she denies symptoms of shortness of breath, orthopnea, PND, lower extremity edema, dizziness, presyncope, syncope, or neurologic sequela.  The patient feels that she is tolerating medications without difficulties and is otherwise without complaint today.   Past Medical History  Diagnosis Date  . Diabetes mellitus   . Hyperlipidemia   . Coronary artery disease     a.  s/p Xience DES to RCA 04/2009;   b. cath 10/10: LAD 40%, CFX 30%, RCA 90%- tx with PCI, EF 60%;  c. echo 07/2009: EF 55-60%, mild MR;  d. TEE 2/12: EF 40%, Large PFO  . Hypertension   . Atrial fibrillation     s/p RFCA (pulmonary vein isolation) 08/2010 (Dr. Johney Frame)  . 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 Total abdominal hysterectomy   . Appendectomy   . Cholecystectomy   . Spinal cyst removed   . Coronary stent placement   . Atrial fibrillation ablation     by [facility] 2/12    Current Outpatient Prescriptions  Medication Sig Dispense Refill  . diphenoxylate-atropine (LOMOTIL) 2.5-0.025 MG per tablet Take 1 tablet by mouth 4 (four) times daily as needed. Patient takes for leaky bowels.      [name] lisinopril (PRINIVIL,ZESTRIL) 10 MG tablet Take 1 tablet (10 mg total) by mouth daily.  90 tablet  3  . nitroGLYCERIN (NITROSTAT) 0.4 MG SL tablet Place 1 tablet (0.4 mg total) under the tongue every 5 (five) minutes as needed.  25 tablet  3  . pantoprazole (PROTONIX) 40 MG tablet Take 1 tablet (40 mg total) by mouth daily.  90 tablet  3  . potassium chloride (KLOR-CON) 10 MEQ CR  tablet Take 10 mEq by mouth daily. Takes 2 tablets in the morning       . rosuvastatin (CRESTOR) 40 MG tablet Take 40 mg by mouth daily.        . traZODone (DESYREL) 50 MG tablet TAKE 2 TABS QHS PRN      . VITAMINS B1 B6 B12 IJ Inject as directed.        . warfarin (COUMADIN) 2.5 MG tablet Take as directed by Anticoagulation clinic.   .  190 tablet  1    No Known Allergies  History   Social History  . Marital Status: Married    Spouse Name: N/A    Number of Children: N/A  . Years of Education: N/A   Occupational History  . retired    Social History Main Topics  . Smoking status: Former Smoker -- 1.0 packs/day for 20 years    Quit date: 07/02/1989  . Smokeless tobacco: Not on file  . Alcohol Use: 0.0 oz/week     wine occasionally  . Drug Use: No  . Sexually Active: Not on file   Other Topics Concern  . Not on file   Social History Narrative  . No narrative on file    Family History  Problem Relation Age of Onset  . Coronary artery  disease Father   . Heart disease Sister     ROS-  All systems are reviewed and are negative except as outlined in the HPI above   Physical Exam: Filed Vitals:   03/28/12 1159  BP: 113/68  Pulse: 73  Height: 5\' 2"  (1.575 m)  Weight: 174 lb (78.926 kg)  SpO2: 97%    GEN- The patient is well appearing, alert and oriented x 3 today.   Head- normocephalic, atraumatic Eyes-  Sclera clear, conjunctiva pink Ears- hearing intact Oropharynx- clear Neck- supple, no JVP Lymph- no cervical lymphadenopathy Lungs- Clear to ausculation bilaterally, normal work of breathing Heart- Regular rate and rhythm, no murmurs, rubs or gallops, PMI not laterally displaced GI- soft, NT, ND, + BS Extremities- no clubbing, cyanosis, or edema MS- no significant deformity or atrophy Skin- no rash or lesion Psych- euthymic mood, full affect Neuro- strength and sensation are intact  ekg today reveals sinus rhythm 77 bpm, normal ekg  Assessment and  Plan:

## 2012-03-28 NOTE — Assessment & Plan Note (Addendum)
No afib symptoms for over a year and a half. She now presents with palpitations Add toprol xl 25mg  daily Place an event monitor to better characterize her palpitations Continue coumadin

## 2012-04-02 ENCOUNTER — Encounter (INDEPENDENT_AMBULATORY_CARE_PROVIDER_SITE_OTHER): Payer: Medicare Other

## 2012-04-02 DIAGNOSIS — I4891 Unspecified atrial fibrillation: Secondary | ICD-10-CM

## 2012-04-07 ENCOUNTER — Encounter: Payer: Self-pay | Admitting: Internal Medicine

## 2012-04-09 DIAGNOSIS — I4891 Unspecified atrial fibrillation: Secondary | ICD-10-CM

## 2012-04-11 ENCOUNTER — Telehealth: Payer: Self-pay | Admitting: Internal Medicine

## 2012-04-11 NOTE — Telephone Encounter (Signed)
plz return call to pt [phone] regarding coumadin hold for upcoming 04/17/12 appnt.

## 2012-04-11 NOTE — Telephone Encounter (Signed)
Having Colonoscopy  with Dr Katrinka Blazing at [facility] in [place]  On 04/17/12  Needs to hold Coumdin 3-4 days prior  Please advise

## 2012-04-13 NOTE — Telephone Encounter (Signed)
OK to hold coumadin up to 5 days prior to the procedure and resume immediatly afterwards.

## 2012-04-15 NOTE — Telephone Encounter (Signed)
Fax number is [phone]

## 2012-04-18 ENCOUNTER — Ambulatory Visit (INDEPENDENT_AMBULATORY_CARE_PROVIDER_SITE_OTHER): Payer: Medicare Other | Admitting: Pharmacist

## 2012-04-18 DIAGNOSIS — I4891 Unspecified atrial fibrillation: Secondary | ICD-10-CM

## 2012-04-18 DIAGNOSIS — Z7901 Long term (current) use of anticoagulants: Secondary | ICD-10-CM

## 2012-04-25 ENCOUNTER — Ambulatory Visit (INDEPENDENT_AMBULATORY_CARE_PROVIDER_SITE_OTHER): Payer: Medicare Other

## 2012-04-25 ENCOUNTER — Encounter: Payer: Self-pay | Admitting: Internal Medicine

## 2012-04-25 ENCOUNTER — Ambulatory Visit (INDEPENDENT_AMBULATORY_CARE_PROVIDER_SITE_OTHER): Payer: Medicare Other | Admitting: Internal Medicine

## 2012-04-25 VITALS — BP 118/64 | HR 75 | Ht 62.0 in | Wt 174.0 lb

## 2012-04-25 DIAGNOSIS — R0602 Shortness of breath: Secondary | ICD-10-CM

## 2012-04-25 DIAGNOSIS — I2510 Atherosclerotic heart disease of native coronary artery without angina pectoris: Secondary | ICD-10-CM

## 2012-04-25 DIAGNOSIS — I4891 Unspecified atrial fibrillation: Secondary | ICD-10-CM

## 2012-04-25 DIAGNOSIS — I10 Essential (primary) hypertension: Secondary | ICD-10-CM

## 2012-04-25 DIAGNOSIS — Z7901 Long term (current) use of anticoagulants: Secondary | ICD-10-CM

## 2012-04-25 NOTE — Assessment & Plan Note (Signed)
No afib symptoms for over a year and a half. Recent monitor confirms no afib Palpitations improved with toprol

## 2012-04-25 NOTE — Patient Instructions (Addendum)
Your physician wants you to follow-up in: 6 months with Dr Johney Frame and 12 months with Dr Jacquiline Doe will receive a reminder letter in the mail two months in advance. If you don't receive a letter, please call our office to schedule the follow-up appointment.   Your physician has requested that you have en exercise stress myoview. For further information please visit [email or web address] Please follow instruction sheet, as given.

## 2012-04-25 NOTE — Assessment & Plan Note (Signed)
She reports dyspnea and chest tightness with moderate activity.  Given her h/o CAD, I think that further CV risk stratification is needed. I will therefore order a GXT myoview at this time.

## 2012-04-25 NOTE — Progress Notes (Signed)
PCP:  Dalbert Mayotte, MD  The patient presents today for electrophysiology followup.  Since last being seen in our clinic, the patient reports doing very well.   She has been without symptomatic afib for a year and a half.   She wore a holter monitor recently which revealed no arrhythmias. She has had no further palpitations.  She does however report chest tighness and SOB with moderate activity.  Today, she denies symptoms of orthopnea, PND, lower extremity edema, dizziness, presyncope, syncope, or neurologic sequela.  The patient feels that she is tolerating medications without difficulties and is otherwise without complaint today.   Past Medical History  Diagnosis Date  . Diabetes mellitus   . Hyperlipidemia   . Coronary artery disease     a.  s/p Xience DES to RCA 04/2009;   b. cath 10/10: LAD 40%, CFX 30%, RCA 90%- tx with PCI, EF 60%;  c. echo 07/2009: EF 55-60%, mild MR;  d. TEE 2/12: EF 40%, Large PFO  . Hypertension   . Atrial fibrillation     s/p RFCA (pulmonary vein isolation) 08/2010 (Dr. Johney Frame)  . 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 Total abdominal hysterectomy   . Appendectomy   . Cholecystectomy   . Spinal cyst removed   . Coronary stent placement   . Atrial fibrillation ablation     by [facility] 2/12    Current Outpatient Prescriptions  Medication Sig Dispense Refill  . diphenoxylate-atropine (LOMOTIL) 2.5-0.025 MG per tablet Take 1 tablet by mouth 4 (four) times daily as needed. Patient takes for leaky bowels.      [name] lisinopril (PRINIVIL,ZESTRIL) 10 MG tablet Take 1 tablet (10 mg total) by mouth daily.  90 tablet  3  . metoprolol succinate (TOPROL XL) 25 MG 24 hr tablet Take one daily  30 tablet  11  . nitroGLYCERIN (NITROSTAT) 0.4 MG SL tablet Place 1 tablet (0.4 mg total) under the tongue every 5 (five) minutes as needed.  25 tablet  3  . pantoprazole (PROTONIX) 40 MG tablet Take 1 tablet (40 mg total) by mouth daily.  90  tablet  3  . potassium chloride (KLOR-CON) 10 MEQ CR tablet Take 10 mEq by mouth daily. Takes 2 tablets in the morning       . rosuvastatin (CRESTOR) 40 MG tablet Take 40 mg by mouth daily.        . traZODone (DESYREL) 50 MG tablet TAKE 2 TABS QHS PRN      . VITAMINS B1 B6 B12 IJ Inject as directed.        . warfarin (COUMADIN) 2.5 MG tablet Take as directed by Anticoagulation clinic.   .  190 tablet  1    No Known Allergies  History   Social History  . Marital Status: Married    Spouse Name: N/A    Number of Children: N/A  . Years of Education: N/A   Occupational History  . retired    Social History Main Topics  . Smoking status: Former Smoker -- 1.0 packs/day for 20 years    Quit date: 07/02/1989  . Smokeless tobacco: Not on file  . Alcohol Use: 0.0 oz/week     wine occasionally  . Drug Use: No  . Sexually Active: Not on file   Other Topics Concern  . Not on file   Social History Narrative  . No narrative on file  Family History  Problem Relation Age of Onset  . Coronary artery disease Father   . Heart disease Sister     ROS-  All systems are reviewed and are negative except as outlined in the HPI above   Physical Exam: Filed Vitals:   04/25/12 1206  BP: 118/64  Pulse: 75  Height: 5\' 2"  (1.575 m)  Weight: 174 lb (78.926 kg)  SpO2: 98%    GEN- The patient is well appearing, alert and oriented x 3 today.   Head- normocephalic, atraumatic Eyes-  Sclera clear, conjunctiva pink Ears- hearing intact Oropharynx- clear Neck- supple, no JVP Lymph- no cervical lymphadenopathy Lungs- Clear to ausculation bilaterally, normal work of breathing Heart- Regular rate and rhythm, no murmurs, rubs or gallops, PMI not laterally displaced GI- soft, NT, ND, + BS Extremities- no clubbing, cyanosis, or edema MS- no significant deformity or atrophy Skin- no rash or lesion Psych- euthymic mood, full affect Neuro- strength and sensation are intact  Holter monitor  04/02/12- 48 hour event monitor reveals no afib Echo 3/13 reviewed with the patient  Assessment and Plan:

## 2012-04-25 NOTE — Assessment & Plan Note (Signed)
Stable No change required today  

## 2012-04-25 NOTE — Assessment & Plan Note (Signed)
As above.

## 2012-05-06 ENCOUNTER — Encounter: Payer: Self-pay | Admitting: Cardiology

## 2012-05-06 ENCOUNTER — Ambulatory Visit (HOSPITAL_COMMUNITY): Payer: Medicare Other | Attending: Cardiology | Admitting: Radiology

## 2012-05-06 ENCOUNTER — Ambulatory Visit (INDEPENDENT_AMBULATORY_CARE_PROVIDER_SITE_OTHER): Payer: Medicare Other | Admitting: *Deleted

## 2012-05-06 ENCOUNTER — Encounter (HOSPITAL_COMMUNITY): Payer: Self-pay | Admitting: Pharmacy Technician

## 2012-05-06 ENCOUNTER — Ambulatory Visit (INDEPENDENT_AMBULATORY_CARE_PROVIDER_SITE_OTHER): Payer: Medicare Other | Admitting: Cardiology

## 2012-05-06 VITALS — BP 136/74 | HR 65 | Ht 62.0 in | Wt 174.0 lb

## 2012-05-06 VITALS — BP 117/65 | Ht 62.0 in | Wt 174.0 lb

## 2012-05-06 DIAGNOSIS — I4891 Unspecified atrial fibrillation: Secondary | ICD-10-CM | POA: Insufficient documentation

## 2012-05-06 DIAGNOSIS — I2510 Atherosclerotic heart disease of native coronary artery without angina pectoris: Secondary | ICD-10-CM

## 2012-05-06 DIAGNOSIS — R42 Dizziness and giddiness: Secondary | ICD-10-CM | POA: Insufficient documentation

## 2012-05-06 DIAGNOSIS — R002 Palpitations: Secondary | ICD-10-CM | POA: Insufficient documentation

## 2012-05-06 DIAGNOSIS — R079 Chest pain, unspecified: Secondary | ICD-10-CM

## 2012-05-06 DIAGNOSIS — I10 Essential (primary) hypertension: Secondary | ICD-10-CM | POA: Insufficient documentation

## 2012-05-06 DIAGNOSIS — E785 Hyperlipidemia, unspecified: Secondary | ICD-10-CM

## 2012-05-06 DIAGNOSIS — R9430 Abnormal result of cardiovascular function study, unspecified: Secondary | ICD-10-CM

## 2012-05-06 DIAGNOSIS — Z7901 Long term (current) use of anticoagulants: Secondary | ICD-10-CM

## 2012-05-06 DIAGNOSIS — R0789 Other chest pain: Secondary | ICD-10-CM | POA: Insufficient documentation

## 2012-05-06 DIAGNOSIS — E119 Type 2 diabetes mellitus without complications: Secondary | ICD-10-CM | POA: Insufficient documentation

## 2012-05-06 DIAGNOSIS — R61 Generalized hyperhidrosis: Secondary | ICD-10-CM | POA: Insufficient documentation

## 2012-05-06 DIAGNOSIS — R0609 Other forms of dyspnea: Secondary | ICD-10-CM | POA: Insufficient documentation

## 2012-05-06 DIAGNOSIS — R0989 Other specified symptoms and signs involving the circulatory and respiratory systems: Secondary | ICD-10-CM | POA: Insufficient documentation

## 2012-05-06 DIAGNOSIS — R0602 Shortness of breath: Secondary | ICD-10-CM | POA: Insufficient documentation

## 2012-05-06 LAB — POCT INR: INR: 2.4

## 2012-05-06 MED ORDER — TECHNETIUM TC 99M SESTAMIBI GENERIC - CARDIOLITE
10.0000 | Freq: Once | INTRAVENOUS | Status: AC | PRN
  Administered 2012-05-06: 10 via INTRAVENOUS

## 2012-05-06 MED ORDER — REGADENOSON 0.4 MG/5ML IV SOLN
0.4000 mg | Freq: Once | INTRAVENOUS | Status: AC
  Administered 2012-05-06: 0.4 mg via INTRAVENOUS

## 2012-05-06 MED ORDER — TECHNETIUM TC 99M SESTAMIBI GENERIC - CARDIOLITE
30.0000 | Freq: Once | INTRAVENOUS | Status: AC | PRN
  Administered 2012-05-06: 30 via INTRAVENOUS

## 2012-05-06 NOTE — Progress Notes (Signed)
.   HPI:  We added her on today.  She underwent nuclear imaging.  Onset of chest discomfort at less than four minutes, and turned "white" as a ghost per staff.  Suggested by patient that this is what she had prior to last PCI.  Symptoms progressive over the past four months.  Images do not show ischemia, but symptoms are reproducible with exercise.  Now pain free.  She is agreement that cath is indicated.  Last cath in 2012 showed disease beyond the prior stent site.  She is on warfarin for afib, and has not had a prior stroke per history.  She did have a large PFO called by Dr. Ross previously by echo with agitated saline, but not reported by Dr. Nishan on her last echo this year.    Current Outpatient Prescriptions  Medication Sig Dispense Refill  . diphenoxylate-atropine (LOMOTIL) 2.5-0.025 MG per tablet Take 1 tablet by mouth 4 (four) times daily as needed. Patient takes for leaky bowels.      . lisinopril (PRINIVIL,ZESTRIL) 10 MG tablet Take 1 tablet (10 mg total) by mouth daily.  90 tablet  3  . metoprolol succinate (TOPROL XL) 25 MG 24 hr tablet Take one daily  30 tablet  11  . nitroGLYCERIN (NITROSTAT) 0.4 MG SL tablet Place 1 tablet (0.4 mg total) under the tongue every 5 (five) minutes as needed.  25 tablet  3  . pantoprazole (PROTONIX) 40 MG tablet Take 1 tablet (40 mg total) by mouth daily.  90 tablet  3  . potassium chloride (KLOR-CON) 10 MEQ CR tablet Take 10 mEq by mouth daily. Takes 2 tablets in the morning       . rosuvastatin (CRESTOR) 40 MG tablet Take 40 mg by mouth daily.        . traZODone (DESYREL) 50 MG tablet TAKE 2 TABS QHS PRN      . VITAMINS B1 B6 B12 IJ Inject as directed.        . warfarin (COUMADIN) 2.5 MG tablet Take as directed by Anticoagulation clinic.   .  190 tablet  1   No current facility-administered medications for this visit.   Facility-Administered Medications Ordered in Other Visits  Medication Dose Route Frequency Provider Last Rate Last Dose  .  [COMPLETED] regadenoson (LEXISCAN) injection SOLN 0.4 mg  0.4 mg Intravenous Once Jeffrey D Katz, MD   0.4 mg at 05/06/12 0904  . [COMPLETED] technetium sestamibi generic (CARDIOLITE) injection 10 milli Curie  10 milli Curie Intravenous Once PRN Jeffrey D Katz, MD   10 milli Curie at 05/06/12 0735  . [COMPLETED] technetium sestamibi generic (CARDIOLITE) injection 30 milli Curie  30 milli Curie Intravenous Once PRN Jeffrey D Katz, MD   30 milli Curie at 05/06/12 0905    No Known Allergies  Past Medical History  Diagnosis Date  . Diabetes mellitus   . Hyperlipidemia   . Coronary artery disease     a.  s/p Xience DES to RCA 04/2009;   b. cath 10/10: LAD 40%, CFX 30%, RCA 90%- tx with PCI, EF 60%;  c. echo 07/2009: EF 55-60%, mild MR;  d. TEE 2/12: EF 40%, Large PFO  . Hypertension   . Atrial fibrillation     s/p RFCA (pulmonary vein isolation) 08/2010 (Dr. Allred)  . BCC (basal cell carcinoma of skin)     dm2    Past Surgical History  Procedure Date  . Total abdominal hysterectomy   . Appendectomy   .   Cholecystectomy   . Spinal cyst removed   . Coronary stent placement   . Atrial fibrillation ablation     by [facility] 2/12    Family History  Problem Relation Age of Onset  . Coronary artery disease Father   . Heart disease Sister     History   Social History  . Marital Status: Married    Spouse Name: N/A    Number of Children: N/A  . Years of Education: N/A   Occupational History  . retired    Social History Main Topics  . Smoking status: Former Smoker -- 1.0 packs/day for 20 years    Quit date: 07/02/1989  . Smokeless tobacco: Not on file  . Alcohol Use: 0.0 oz/week     Comment: wine occasionally  . Drug Use: No  . Sexually Active: Not on file   Other Topics Concern  . Not on file   Social History Narrative  . No narrative on file    ROS: Please see the HPI.  All other systems reviewed and negative.  PHYSICAL EXAM:  BP 136/74  Pulse 65  Ht 5' 2" (1.575 m)   Wt 174 lb (78.926 kg)  BMI 31.83 kg/m2  SpO2 96%  General: Well developed, well nourished, in no acute distress. Head:  Normocephalic and atraumatic. Neck: no JVD Lungs: Clear to auscultation and percussion. Heart: Normal S1 and S2.  No murmur, rubs or gallops.  Abdomen:  Normal bowel sounds; soft; non tender; no organomegaly Pulses: Pulses normal in all 4 extremities. Extremities: No clubbing or cyanosis. No edema. Neurologic: Alert and oriented x 3.  EKG:  See nuc report   ASSESSMENT AND PLAN: 

## 2012-05-06 NOTE — Progress Notes (Signed)
[facility] SITE 3 NUCLEAR MED [id number] [place] [address] [phone]  Cardiology Nuclear Med Study  [name] is a 66 y.o. female     MRN : [id number]     DOB: June 15, 1946  Procedure Date: 05/06/2012  Nuclear Med Background Indication for Stress Test:  Evaluation for Ischemia History:  AFIB, '10 Stents: RCA, 2/12 Ablation, 4/12 Heart Cath: Patent Stent  50-60%RCA Distal To Stent Site NL LV FX Cardiac Risk Factors: History of Smoking, Hypertension, Lipids and NIDDM  Symptoms:  Chest Tightness, Diaphoresis, Dizziness, DOE, Light-Headedness, Palpitations and SOB   Nuclear Pre-Procedure Caffeine/Decaff Intake:  None NPO After: 8:00pm   Lungs:  clear O2 Sat: 95% on room air. IV 0.9% NS with Angio Cath:  22g  IV Site: R Hand  IV Started by:  Cathlyn Parsons, RN  Chest Size (in):  38 Cup Size: D  Height: 5\' 2"  (1.575 m)  Weight:  174 lb (78.926 kg)  BMI:  Body mass index is 31.83 kg/(m^2). Tech Comments: Toprol held x 24 hrs. This patient walked on the treadmill for a short period of time. She became SOB, lt. Headed, very pale, and had chest tightness. She all of a sudden said she couldn't do it anymore and she dropped. Myself and 2 coworkers grabbed her and she never hit or got hurt. She was placed in the recliner were she rested before we gave her the Lexiscan Stress Test. She doesn't remember going down on the treadmill. She never completely had a (+) LOC. Her pictures were checked, they were insignificant. I chose to consult with the DOD T. Stuckey. After looking at her past cath reports, the conclusion was that she probably has a significant blockage distal to her stent in her RCA. She was evaluated and set up for a cath for Thursday. S.Williams EMTP    Nuclear Med Study 1 or 2 day study: 1 day  Stress Test Type:  Stress  Reading MD: Willa Rough, MD  Order Authorizing Provider:  Jarold Song  Resting Radionuclide: Technetium 21m  Sestamibi  Resting Radionuclide Dose: 11.0 mCi   Stress Radionuclide:  Technetium 54m Sestamibi  Stress Radionuclide Dose: 33.0 mCi           Stress Protocol Rest HR: 58 Stress HR: 136  Rest BP: 117/65 Stress BP: 155/60  Exercise Time (min): 4:14 METS: 5.90   Predicted Max HR: 152 bpm % Max HR: 89.47 bpm Rate Pressure Product: 62952   Dose of Adenosine (mg):  n/a Dose of Lexiscan: n/a mg  Dose of Atropine (mg): n/a Dose of Dobutamine: n/a mcg/kg/min (at max HR)  Stress Test Technologist: Milana Na, EMT-P  Nuclear Technologist:  Domenic Polite, CNMT     Rest Procedure:  Myocardial perfusion imaging was performed at rest 45 minutes following the intravenous administration of Technetium 49m Sestamibi. Rest ECG: Sinus Bradycardia  Stress Procedure:  The patient performed treadmill exercise using a Bruce  Protocol for 4:14 minutes. The patient stopped due to sob, lt. headed, and chest tightness.  There were non specific ST-T wave changes.  Technetium 51m Sestamibi was injected at peak exercise and myocardial perfusion imaging was performed after a brief delay. Stress ECG: Non Specific ST,T changes  QPS Raw Data Images:  Patient motion noted; appropriate software correction applied. Stress Images:  Normal homogeneous uptake in all areas of the myocardium. Rest Images:  Normal homogeneous uptake in all areas of the myocardium. Subtraction (SDS):  No evidence of ischemia.  Transient Ischemic Dilatation (Normal <1.22):  0.97 Lung/Heart Ratio (Normal <0.45):  0.13  Quantitative Gated Spect Images QGS EDV:  61 ml QGS ESV:  19 ml  Impression Exercise Capacity:  Refer to the summary below. .BP Response:  Normal blood pressure response. Clinical Symptoms:  Refer to the summary below. ECG Impression:  No significant ST segment change suggestive of ischemia. Comparison with Prior Nuclear Study: No images to compare  Overall Impression:  The full details of the patient's exercise  effort are outlined above. She was walking on the treadmill and then suddenly felt poorly. She was supported by the staff. There was no injury. She was assessed and it was felt that she was stable to proceed with Lexiscan. This was done. The images reveal no scar or ischemia. Wall motion is normal. The entire study was assessed by Dr. Riley Kill. This included review of prior cath film reports. It was felt that catheterization should be arranged.  LV Ejection Fraction: 69%.  LV Wall Motion:  Normal Wall Motion.  Willa Rough, MD

## 2012-05-06 NOTE — Assessment & Plan Note (Signed)
Progressive symptoms worrisome for recurrent ischemia with clinically positive exercise test.  She is agreeable to cardiac cath and will hold her warfarin and start ASA.  Understands risks of procedure.  Set up for Friday tentatively with PT INR on Thursday.

## 2012-05-06 NOTE — Patient Instructions (Addendum)
Your physician has requested that you have a cardiac catheterization. Cardiac catheterization is used to diagnose and/or treat various heart conditions. Doctors may recommend this procedure for a number of different reasons. The most common reason is to evaluate chest pain. Chest pain can be a symptom of coronary artery disease (CAD), and cardiac catheterization can show whether plaque is narrowing or blocking your heart's arteries. This procedure is also used to evaluate the valves, as well as measure the blood flow and oxygen levels in different parts of your heart. For further information please visit [email or web address] Please follow instruction sheet, as given.  Your physician has recommended you make the following change in your medication: START Aspirin 81mg  (take 4 tablets today only) and then decrease to one tablet daily  Your physician recommends that you return for lab work on 05/08/12 (please come into the office between 7:30 and 9:30): BMP, CBC and PT/INR

## 2012-05-08 ENCOUNTER — Other Ambulatory Visit (INDEPENDENT_AMBULATORY_CARE_PROVIDER_SITE_OTHER): Payer: Medicare Other

## 2012-05-08 DIAGNOSIS — I2510 Atherosclerotic heart disease of native coronary artery without angina pectoris: Secondary | ICD-10-CM

## 2012-05-08 DIAGNOSIS — R9430 Abnormal result of cardiovascular function study, unspecified: Secondary | ICD-10-CM

## 2012-05-08 DIAGNOSIS — R079 Chest pain, unspecified: Secondary | ICD-10-CM

## 2012-05-08 LAB — BASIC METABOLIC PANEL
BUN: 22 mg/dL (ref 6–23)
GFR: 42.88 mL/min — ABNORMAL LOW (ref >60.00)
Potassium: 3.8 mEq/L (ref 3.5–5.1)

## 2012-05-08 LAB — CBC WITH DIFFERENTIAL/PLATELET
Basophils Relative: 0.5 % (ref 0.0–3.0)
Eosinophils Absolute: 0.1 10*3/uL (ref 0.0–0.7)
HCT: 37 % (ref 36.0–46.0)
Hemoglobin: 12.3 g/dL (ref 12.0–15.0)
MCHC: 33.4 g/dL (ref 30.0–36.0)
MCV: 92.9 fl (ref 78.0–100.0)
Monocytes Absolute: 0.4 10*3/uL (ref 0.1–1.0)
Neutro Abs: 2.5 10*3/uL (ref 1.4–7.7)
RBC: 3.98 Mil/uL (ref 3.87–5.11)

## 2012-05-08 LAB — PROTIME-INR
INR: 1.7 ratio — ABNORMAL HIGH (ref 0.8–1.0)
Prothrombin Time: 17.4 s — ABNORMAL HIGH (ref 10.2–12.4)

## 2012-05-09 ENCOUNTER — Other Ambulatory Visit: Payer: Self-pay

## 2012-05-09 ENCOUNTER — Ambulatory Visit (HOSPITAL_COMMUNITY)
Admission: RE | Admit: 2012-05-09 | Discharge: 2012-05-09 | Disposition: A | Discharge status: Home / Self Care | Payer: Medicare Other | Source: Ambulatory Visit | Attending: Cardiology | Admitting: Cardiology

## 2012-05-09 ENCOUNTER — Encounter (HOSPITAL_COMMUNITY)
Admission: RE | Disposition: A | Discharge status: Home / Self Care | Payer: Self-pay | Source: Ambulatory Visit | Attending: Cardiology

## 2012-05-09 DIAGNOSIS — I4891 Unspecified atrial fibrillation: Secondary | ICD-10-CM | POA: Insufficient documentation

## 2012-05-09 DIAGNOSIS — Z7901 Long term (current) use of anticoagulants: Secondary | ICD-10-CM | POA: Insufficient documentation

## 2012-05-09 DIAGNOSIS — Z9861 Coronary angioplasty status: Secondary | ICD-10-CM | POA: Insufficient documentation

## 2012-05-09 DIAGNOSIS — I2510 Atherosclerotic heart disease of native coronary artery without angina pectoris: Secondary | ICD-10-CM | POA: Insufficient documentation

## 2012-05-09 DIAGNOSIS — R9439 Abnormal result of other cardiovascular function study: Secondary | ICD-10-CM | POA: Insufficient documentation

## 2012-05-09 DIAGNOSIS — R079 Chest pain, unspecified: Secondary | ICD-10-CM | POA: Insufficient documentation

## 2012-05-09 HISTORY — PX: LEFT HEART CATHETERIZATION WITH CORONARY ANGIOGRAM: SHX5451

## 2012-05-09 LAB — PROTIME-INR
INR: 1.34 (ref 0.00–1.49)
Prothrombin Time: 16.3 seconds — ABNORMAL HIGH (ref 11.6–15.2)

## 2012-05-09 SURGERY — LEFT HEART CATHETERIZATION WITH CORONARY ANGIOGRAM
Anesthesia: LOCAL

## 2012-05-09 MED ORDER — NITROGLYCERIN 0.2 MG/ML ON CALL CATH LAB
INTRAVENOUS | Status: AC
  Filled 2012-05-09: qty 1

## 2012-05-09 MED ORDER — LIDOCAINE HCL (PF) 1 % IJ SOLN
INTRAMUSCULAR | Status: AC
  Filled 2012-05-09: qty 30

## 2012-05-09 MED ORDER — SODIUM CHLORIDE 0.9 % IJ SOLN: 3.0000 mL | Freq: Two times a day (BID) | INTRAMUSCULAR | Status: DC

## 2012-05-09 MED ORDER — SODIUM CHLORIDE 0.9 % IJ SOLN: 3.0000 mL | INTRAMUSCULAR | Status: DC | PRN

## 2012-05-09 MED ORDER — SODIUM CHLORIDE 0.9 % IV SOLN
INTRAVENOUS | Status: DC
  Administered 2012-05-09: 11:00:00 via INTRAVENOUS

## 2012-05-09 MED ORDER — FENTANYL CITRATE 0.05 MG/ML IJ SOLN
INTRAMUSCULAR | Status: AC
  Filled 2012-05-09: qty 2

## 2012-05-09 MED ORDER — HEPARIN (PORCINE) IN NACL 2-0.9 UNIT/ML-% IJ SOLN
INTRAMUSCULAR | Status: AC
  Filled 2012-05-09: qty 1500

## 2012-05-09 MED ORDER — SODIUM CHLORIDE 0.9 % IV SOLN: 250.0000 mL | INTRAVENOUS | Status: DC | PRN

## 2012-05-09 MED ORDER — ACETAMINOPHEN 325 MG PO TABS: 650.0000 mg | ORAL_TABLET | ORAL | Status: DC | PRN

## 2012-05-09 MED ORDER — MIDAZOLAM HCL 2 MG/2ML IJ SOLN
INTRAMUSCULAR | Status: AC
  Filled 2012-05-09: qty 2

## 2012-05-09 MED ORDER — SODIUM CHLORIDE 0.9 % IV SOLN
INTRAVENOUS | Status: DC
  Administered 2012-05-09: 1000 mL via INTRAVENOUS

## 2012-05-09 MED ORDER — ASPIRIN 81 MG PO CHEW
324.0000 mg | CHEWABLE_TABLET | ORAL | Status: AC
Start: 2012-05-10 — End: 2012-05-09
  Administered 2012-05-09: 324 mg via ORAL

## 2012-05-09 MED ORDER — ONDANSETRON HCL 4 MG/2ML IJ SOLN: 4.0000 mg | Freq: Four times a day (QID) | INTRAMUSCULAR | Status: DC | PRN

## 2012-05-09 NOTE — H&P (View-Only) (Signed)
.   HPI:  We added her on today.  She underwent nuclear imaging.  Onset of chest discomfort at less than four minutes, and turned "white" as a ghost per staff.  Suggested by patient that this is what she had prior to last PCI.  Symptoms progressive over the past four months.  Images do not show ischemia, but symptoms are reproducible with exercise.  Now pain free.  She is agreement that cath is indicated.  Last cath in 2012 showed disease beyond the prior stent site.  She is on warfarin for afib, and has not had a prior stroke per history.  She did have a large PFO called by Dr. Tenny Craw previously by echo with agitated saline, but not reported by Dr. Eden Emms on her last echo this year.    Current Outpatient Prescriptions  Medication Sig Dispense Refill  . diphenoxylate-atropine (LOMOTIL) 2.5-0.025 MG per tablet Take 1 tablet by mouth 4 (four) times daily as needed. Patient takes for leaky bowels.      [name] lisinopril (PRINIVIL,ZESTRIL) 10 MG tablet Take 1 tablet (10 mg total) by mouth daily.  90 tablet  3  . metoprolol succinate (TOPROL XL) 25 MG 24 hr tablet Take one daily  30 tablet  11  . nitroGLYCERIN (NITROSTAT) 0.4 MG SL tablet Place 1 tablet (0.4 mg total) under the tongue every 5 (five) minutes as needed.  25 tablet  3  . pantoprazole (PROTONIX) 40 MG tablet Take 1 tablet (40 mg total) by mouth daily.  90 tablet  3  . potassium chloride (KLOR-CON) 10 MEQ CR tablet Take 10 mEq by mouth daily. Takes 2 tablets in the morning       . rosuvastatin (CRESTOR) 40 MG tablet Take 40 mg by mouth daily.        . traZODone (DESYREL) 50 MG tablet TAKE 2 TABS QHS PRN      . VITAMINS B1 B6 B12 IJ Inject as directed.        . warfarin (COUMADIN) 2.5 MG tablet Take as directed by Anticoagulation clinic.   .  190 tablet  1   No current facility-administered medications for this visit.   Facility-Administered Medications Ordered in Other Visits  Medication Dose Route Frequency Provider Last Rate Last Dose  .  [COMPLETED] regadenoson (LEXISCAN) injection SOLN 0.4 mg  0.4 mg Intravenous Once Luis Abed, MD   0.4 mg at 05/06/12 0904  . [COMPLETED] technetium sestamibi generic (CARDIOLITE) injection 10 milli Curie  10 milli Curie Intravenous Once PRN Luis Abed, MD   10 milli Curie at 05/06/12 [phone]  . [COMPLETED] technetium sestamibi generic (CARDIOLITE) injection 30 milli Curie  30 milli Curie Intravenous Once PRN Luis Abed, MD   30 milli Curie at 05/06/12 [phone]    No Known Allergies  Past Medical History  Diagnosis Date  . Diabetes mellitus   . Hyperlipidemia   . Coronary artery disease     a.  s/p Xience DES to RCA 04/2009;   b. cath 10/10: LAD 40%, CFX 30%, RCA 90%- tx with PCI, EF 60%;  c. echo 07/2009: EF 55-60%, mild MR;  d. TEE 2/12: EF 40%, Large PFO  . Hypertension   . Atrial fibrillation     s/p RFCA (pulmonary vein isolation) 08/2010 (Dr. Johney Frame)  . BCC (basal cell carcinoma of skin)     dm2    Past Surgical History  Procedure Date  . Total abdominal hysterectomy   . Appendectomy   .  Cholecystectomy   . Spinal cyst removed   . Coronary stent placement   . Atrial fibrillation ablation     by [facility] 2/12    Family History  Problem Relation Age of Onset  . Coronary artery disease Father   . Heart disease Sister     History   Social History  . Marital Status: Married    Spouse Name: N/A    Number of Children: N/A  . Years of Education: N/A   Occupational History  . retired    Social History Main Topics  . Smoking status: Former Smoker -- 1.0 packs/day for 20 years    Quit date: 07/02/1989  . Smokeless tobacco: Not on file  . Alcohol Use: 0.0 oz/week     Comment: wine occasionally  . Drug Use: No  . Sexually Active: Not on file   Other Topics Concern  . Not on file   Social History Narrative  . No narrative on file    ROS: Please see the HPI.  All other systems reviewed and negative.  PHYSICAL EXAM:  BP 136/74  Pulse 65  Ht 5\' 2"  (1.575 m)   Wt 174 lb (78.926 kg)  BMI 31.83 kg/m2  SpO2 96%  General: Well developed, well nourished, in no acute distress. Head:  Normocephalic and atraumatic. Neck: no JVD Lungs: Clear to auscultation and percussion. Heart: Normal S1 and S2.  No murmur, rubs or gallops.  Abdomen:  Normal bowel sounds; soft; non tender; no organomegaly Pulses: Pulses normal in all 4 extremities. Extremities: No clubbing or cyanosis. No edema. Neurologic: Alert and oriented x 3.  EKG:  See nuc report   ASSESSMENT AND PLAN:

## 2012-05-09 NOTE — Assessment & Plan Note (Signed)
No recent labs in chart.

## 2012-05-09 NOTE — Assessment & Plan Note (Signed)
Controlled at present.  Continue meds.   

## 2012-05-09 NOTE — CV Procedure (Signed)
   Cardiac Catheterization Procedure Note  Name: [name] MRN: [id number] DOB: 1946-05-26  Procedure: Left Heart Cath, Selective Coronary Angiography, LV angiography  Indication: abnormal CV function test, chest pain, prior PCI   Procedural details: The right groin was prepped, draped, and anesthetized with 1% lidocaine. Using modified Seldinger technique, a 4 French sheath was introduced into the right femoral artery. Standard Judkins catheters were used for coronary angiography and left ventriculography. Catheter exchanges were performed over a guidewire. There were no immediate procedural complications. The patient was transferred to the post catheterization recovery area for further monitoring.  We did have to change cath labs at the beginning for hardware malfunction in the lab  ([facility]).  I also reviewed the films with the patient's husband.   Procedural Findings: Hemodynamics:  AO 103/52 (75) LV 100/6 No gradient on pullback   Coronary angiography: Coronary dominance: right  Left mainstem: Short and normal  Left anterior descending (LAD): Courses to the apex and provides a moderate size diagonal.  There is vessel calcification and mild luminal irregularity throughout.  Of note, there is a 30-40% ostial diagonal stenosis.  No high grade obstruction is noted.   Left circumflex (LCx): Provides a ramus intermedius that bifurcates distally.  There is segmental plaque of probably 40-50% in the proximal mid junction.  It appears unchanged from the prior study.  The av circ has moderate calcification and a moderate size marginal branch, and an AV branch with 3 small PL branches.  No areas of high grade disease are noted.   Right coronary artery (RCA): The RCA has been previously stented.  The stent is widely patent.  There is about 40-50% smooth narrowing just after the stent, and this is slightly improved after IC TNG.  There is diffuse segmental plaque of 40% beyond this area that  does not appear critical.  There is also 40-50% distal plaque as well. After NTG, this area opens as well.    Left ventriculography: Left ventricular systolic function is normal, LVEF is estimated at 55-65%, there is no significant mitral regurgitation   Final Conclusions:   1.  Continued patency of the RCA stent with some moderate plaque distally. 2.  Scattered moderate plaque in the ramus intermedius without critical narrowing  Recommendations:  1.  Continued medical therapy 2.  Resume warfarin.  I have discussed her case with Dr. Johney Frame.  There is not high grade disease, and this likely does not easily explained her treadmill findings.  The threshold for PCI would be fairly high given her need for chronic warfarin therapy, so we will have her follow up with Dr. Johney Frame, and if symptoms continue, further evaluation of other etiologies and causes of symptoms.    Shawnie Pons 05/09/2012, 9:13 AM

## 2012-05-09 NOTE — Interval H&P Note (Signed)
History and Physical Interval Note:  05/09/2012 7:49 AM I have reviewed with the patient and her husband.  Her INR today is down to 1.3.  Will plan cath today as outlined to assess given status despite normal perfusion.  Her stress test is clinically positive.    [name]  has presented today for surgery, with the diagnosis of cp  The various methods of treatment have been discussed with the patient and family. After consideration of risks, benefits and other options for treatment, the patient has consented to  Procedure(s) (LRB) with comments: LEFT HEART CATHETERIZATION WITH CORONARY ANGIOGRAM (N/A) as a surgical intervention .  The patient's history has been reviewed, patient examined, no change in status, stable for surgery.  I have reviewed the patient's chart and labs.  Questions were answered to the patient's satisfaction.     [name]

## 2012-05-16 ENCOUNTER — Other Ambulatory Visit: Payer: Self-pay | Admitting: *Deleted

## 2012-05-16 MED ORDER — METOPROLOL SUCCINATE ER 25 MG PO TB24: 25.0000 mg | ORAL_TABLET | Freq: Every day | ORAL | Status: DC

## 2012-05-26 ENCOUNTER — Ambulatory Visit (INDEPENDENT_AMBULATORY_CARE_PROVIDER_SITE_OTHER): Payer: Medicare Other

## 2012-05-26 DIAGNOSIS — I4891 Unspecified atrial fibrillation: Secondary | ICD-10-CM

## 2012-05-26 DIAGNOSIS — Z7901 Long term (current) use of anticoagulants: Secondary | ICD-10-CM

## 2012-06-02 ENCOUNTER — Other Ambulatory Visit: Payer: Self-pay | Admitting: Internal Medicine

## 2012-06-04 ENCOUNTER — Other Ambulatory Visit: Payer: Self-pay | Admitting: *Deleted

## 2012-06-04 MED ORDER — WARFARIN SODIUM 2.5 MG PO TABS: 2.5000 mg | ORAL_TABLET | ORAL | Status: DC

## 2012-06-27 ENCOUNTER — Ambulatory Visit (INDEPENDENT_AMBULATORY_CARE_PROVIDER_SITE_OTHER): Payer: Medicare Other | Admitting: *Deleted

## 2012-06-27 DIAGNOSIS — Z7901 Long term (current) use of anticoagulants: Secondary | ICD-10-CM

## 2012-06-27 DIAGNOSIS — I4891 Unspecified atrial fibrillation: Secondary | ICD-10-CM

## 2012-06-27 LAB — POCT INR: INR: 3.5

## 2012-07-11 ENCOUNTER — Ambulatory Visit (INDEPENDENT_AMBULATORY_CARE_PROVIDER_SITE_OTHER): Payer: Medicare Other

## 2012-07-11 DIAGNOSIS — I4891 Unspecified atrial fibrillation: Secondary | ICD-10-CM

## 2012-07-11 DIAGNOSIS — Z7901 Long term (current) use of anticoagulants: Secondary | ICD-10-CM

## 2012-07-11 LAB — POCT INR: INR: 1.9

## 2012-08-05 ENCOUNTER — Ambulatory Visit (INDEPENDENT_AMBULATORY_CARE_PROVIDER_SITE_OTHER): Payer: Medicare Other | Admitting: *Deleted

## 2012-08-05 DIAGNOSIS — I4891 Unspecified atrial fibrillation: Secondary | ICD-10-CM

## 2012-08-05 DIAGNOSIS — Z7901 Long term (current) use of anticoagulants: Secondary | ICD-10-CM

## 2012-08-05 LAB — POCT INR: INR: 4.3

## 2012-08-16 ENCOUNTER — Other Ambulatory Visit: Payer: Self-pay

## 2012-08-19 ENCOUNTER — Ambulatory Visit (INDEPENDENT_AMBULATORY_CARE_PROVIDER_SITE_OTHER): Payer: Medicare Other | Admitting: *Deleted

## 2012-08-19 DIAGNOSIS — I4891 Unspecified atrial fibrillation: Secondary | ICD-10-CM

## 2012-08-19 DIAGNOSIS — Z7901 Long term (current) use of anticoagulants: Secondary | ICD-10-CM

## 2012-08-19 LAB — POCT INR: INR: 2.5

## 2012-08-28 ENCOUNTER — Encounter: Payer: Self-pay | Admitting: Internal Medicine

## 2012-08-28 ENCOUNTER — Ambulatory Visit (INDEPENDENT_AMBULATORY_CARE_PROVIDER_SITE_OTHER): Payer: Medicare Other | Admitting: Internal Medicine

## 2012-08-28 VITALS — BP 108/55 | HR 64 | Wt 175.6 lb

## 2012-08-28 DIAGNOSIS — I2510 Atherosclerotic heart disease of native coronary artery without angina pectoris: Secondary | ICD-10-CM

## 2012-08-28 DIAGNOSIS — I4891 Unspecified atrial fibrillation: Secondary | ICD-10-CM

## 2012-08-28 DIAGNOSIS — I10 Essential (primary) hypertension: Secondary | ICD-10-CM

## 2012-08-28 LAB — PROTIME-INR: Prothrombin Time: 22.5 s — ABNORMAL HIGH (ref 10.2–12.4)

## 2012-08-28 MED ORDER — DABIGATRAN ETEXILATE MESYLATE 150 MG PO CAPS
150.0000 mg | ORAL_CAPSULE | Freq: Two times a day (BID) | ORAL | Status: DC
Start: 2012-08-28 — End: 2012-09-24

## 2012-08-28 NOTE — Patient Instructions (Addendum)
Your physician wants you to follow-up in: 6 months with Dr Jacquiline Doe will receive a reminder letter in the mail two months in advance. If you don't receive a letter, please call our office to schedule the follow-up appointment.   Your physician has recommended you make the following change in your medication:  1)STOP Coumadin 2) Start Pradaxa 150mg  one by mouth twice daily when INR is less than 2.0

## 2012-08-31 NOTE — Assessment & Plan Note (Signed)
Stable She would like to switch from coumadin to a novel anticoagulant. Today, I discussed novel anticoagulants including pradaxa, xarelto, and eliquis today as indicated for risk reduction in stroke and systemic emboli with nonvalvular atrial fibrillation.  Risks, benefits, and alternatives to each of these drugs were discussed at length today.  She would prefer pradaxa. I will therefore start pradaxa 150mg  BID once INR < 2.

## 2012-08-31 NOTE — Progress Notes (Signed)
PCP:  Dalbert Mayotte, MD  The patient presents today for electrophysiology followup.  Since last being seen in our clinic, the patient reports doing very well.  Afib is well controlled. She has had no further palpitations.  She denies any further chest tightness or SOB.  Today, she denies symptoms of orthopnea, PND, lower extremity edema, dizziness, presyncope, syncope, or neurologic sequela.  She underwent cath by Dr Riley Kill which revealed stable CAD for which medical management was advised.  The patient feels that she is tolerating medications without difficulties and is otherwise without complaint today.   Past Medical History  Diagnosis Date  . Diabetes mellitus   . Hyperlipidemia   . Coronary artery disease     a.  s/p Xience DES to RCA 04/2009;   b. cath 10/10: LAD 40%, CFX 30%, RCA 90%- tx with PCI, EF 60%;  c. echo 07/2009: EF 55-60%, mild MR;  d. TEE 2/12: EF 40%, Large PFO  . Hypertension   . Atrial fibrillation     s/p RFCA (pulmonary vein isolation) 08/2010 (Dr. Johney Frame)  . 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 Total abdominal hysterectomy    . Appendectomy    . Cholecystectomy    . Spinal cyst removed    . Coronary stent placement    . Atrial fibrillation ablation      by [facility] 2/12    Current Outpatient Prescriptions  Medication Sig Dispense Refill  . aspirin EC 81 MG tablet Take 1 tablet (81 mg total) by mouth daily.  1 tablet  0  . Cyanocobalamin (VITAMIN B-12 IJ) Inject 1 each as directed every 30 (thirty) days.      . diphenoxylate-atropine (LOMOTIL) 2.5-0.025 MG per tablet Take 1 tablet by mouth 4 (four) times daily as needed. Patient takes for leaky bowels.      [name] lisinopril (PRINIVIL,ZESTRIL) 10 MG tablet Take 1 tablet (10 mg total) by mouth daily.  90 tablet  3  . metoprolol succinate (TOPROL-XL) 25 MG 24 hr tablet Take 1 tablet (25 mg total) by mouth daily.  90 tablet  3  . nitroGLYCERIN (NITROSTAT) 0.4 MG  SL tablet Place 0.4 mg under the tongue every 5 (five) minutes as needed. For chest pain      . pantoprazole (PROTONIX) 40 MG tablet Take 1 tablet (40 mg total) by mouth daily.  90 tablet  3  . potassium chloride (KLOR-CON) 10 MEQ CR tablet Take 20 mEq by mouth every morning.       . rosuvastatin (CRESTOR) 40 MG tablet Take 40 mg by mouth daily.        . traZODone (DESYREL) 50 MG tablet Take 100 mg by mouth at bedtime.      . dabigatran (PRADAXA) 150 MG CAPS Take 1 capsule (150 mg total) by mouth every 12 (twelve) hours.  60 capsule  6   No current facility-administered medications for this visit.    No Known Allergies  History   Social History  . Marital Status: Married    Spouse Name: N/A    Number of Children: N/A  . Years of Education: N/A   Occupational History  . retired    Social History Main Topics  . Smoking status: Former Smoker -- 1.00 packs/day for 20 years    Quit date: 07/02/1989  . Smokeless tobacco: Not on file  . Alcohol Use: 0.0 oz/week     Comment:  wine occasionally  . Drug Use: No  . Sexually Active: Not on file   Other Topics Concern  . Not on file   Social History Narrative  . No narrative on file    Family History  Problem Relation Age of Onset  . Coronary artery disease Father   . Heart disease Sister     Physical Exam: Filed Vitals:   08/28/12 1208  BP: 108/55  Pulse: 64  Weight: 175 lb 9.6 oz (79.652 kg)    GEN- The patient is well appearing, alert and oriented x 3 today.   Head- normocephalic, atraumatic Eyes-  Sclera clear, conjunctiva pink Ears- hearing intact Oropharynx- clear Neck- supple, no JVP Lymph- no cervical lymphadenopathy Lungs- Clear to ausculation bilaterally, normal work of breathing Heart- Regular rate and rhythm, no murmurs, rubs or gallops, PMI not laterally displaced GI- soft, NT, ND, + BS Extremities- no clubbing, cyanosis, or edema MS- no significant deformity or atrophy Skin- no rash or lesion Psych-  euthymic mood, full affect Neuro- strength and sensation are intact  Holter monitor 04/02/12- 48 hour event monitor reveals no afib Echo 3/13 reviewed with the patient Cath 05/09/12 reviewed  ekg today reveals sinus rhythm 64 bpm, otherwise normal ekg  Assessment and Plan:

## 2012-08-31 NOTE — Assessment & Plan Note (Signed)
Stable No change required today  

## 2012-09-23 ENCOUNTER — Encounter: Payer: Self-pay | Admitting: Internal Medicine

## 2012-09-24 ENCOUNTER — Telehealth: Payer: Self-pay | Admitting: *Deleted

## 2012-09-24 MED ORDER — DABIGATRAN ETEXILATE MESYLATE 150 MG PO CAPS: 150.0000 mg | ORAL_CAPSULE | Freq: Two times a day (BID) | ORAL | Status: DC

## 2012-09-24 NOTE — Telephone Encounter (Signed)
Rx sent in

## 2012-09-30 ENCOUNTER — Other Ambulatory Visit: Payer: Self-pay | Admitting: *Deleted

## 2012-09-30 MED ORDER — PANTOPRAZOLE SODIUM 40 MG PO TBEC: 40.0000 mg | DELAYED_RELEASE_TABLET | Freq: Every day | ORAL | Status: DC

## 2012-10-14 ENCOUNTER — Telehealth: Payer: Self-pay | Admitting: Internal Medicine

## 2012-10-14 MED ORDER — PANTOPRAZOLE SODIUM 40 MG PO TBEC: 40.0000 mg | DELAYED_RELEASE_TABLET | Freq: Every day | ORAL | Status: DC

## 2012-10-14 NOTE — Telephone Encounter (Signed)
New problem   Pt having problems accessing MyChart it it telling her she has been denied. Pt would like to speak to nurse concerning this matter.

## 2012-10-14 NOTE — Telephone Encounter (Signed)
Patient calling b/c [facility] said they never received a rx approval for Protonix from [place]. Refill encounter dated 09-30-12 states it has been sent but patient was told we never responded to multiple request. I assured her we have sent this request and I will resend this request with hopes that they will be able to pull this one up. She verbally agreed and  Thank you.  Protonix 40mg , once daily by mouth, #90, 3RF as first request.   Micki Riley, CMA

## 2012-10-15 ENCOUNTER — Ambulatory Visit: Payer: Self-pay | Admitting: Internal Medicine

## 2012-10-15 DIAGNOSIS — I4891 Unspecified atrial fibrillation: Secondary | ICD-10-CM

## 2013-01-05 ENCOUNTER — Other Ambulatory Visit: Payer: Self-pay | Admitting: Internal Medicine

## 2013-01-05 DIAGNOSIS — I10 Essential (primary) hypertension: Secondary | ICD-10-CM

## 2013-02-09 ENCOUNTER — Encounter: Payer: Self-pay | Admitting: Internal Medicine

## 2013-02-09 ENCOUNTER — Ambulatory Visit (INDEPENDENT_AMBULATORY_CARE_PROVIDER_SITE_OTHER): Payer: Medicare Other | Admitting: Internal Medicine

## 2013-02-09 VITALS — BP 102/62 | HR 64 | Ht 63.0 in | Wt 173.0 lb

## 2013-02-09 DIAGNOSIS — I4891 Unspecified atrial fibrillation: Secondary | ICD-10-CM

## 2013-02-09 DIAGNOSIS — I2510 Atherosclerotic heart disease of native coronary artery without angina pectoris: Secondary | ICD-10-CM

## 2013-02-09 DIAGNOSIS — I10 Essential (primary) hypertension: Secondary | ICD-10-CM

## 2013-02-09 DIAGNOSIS — E785 Hyperlipidemia, unspecified: Secondary | ICD-10-CM

## 2013-02-09 NOTE — Patient Instructions (Addendum)
Your physician wants you to follow-up in: 6 months with [name] and 12 months with [name] will receive a reminder letter in the mail two months in advance. If you don't receive a letter, please call our office to schedule the follow-up appointment.

## 2013-02-09 NOTE — Progress Notes (Signed)
PCP:  Renee Mayotte, MD  The patient presents today for electrophysiology followup.  Since last being seen in our clinic, the patient reports doing very well.  Afib is well controlled. She has had no further palpitations.  She denies any further chest tightness or SOB.  Today, she denies symptoms of orthopnea, PND, lower extremity edema, dizziness, presyncope, syncope, or neurologic sequela.   The patient feels that she is tolerating medications without difficulties and is otherwise without complaint today.   Past Medical History  Diagnosis Date  . Diabetes mellitus   . Hyperlipidemia   . Coronary artery disease     a.  s/p Xience DES to RCA 04/2009;   b. cath 10/10: LAD 40%, CFX 30%, RCA 90%- tx with PCI, EF 60%;  c. echo 07/2009: EF 55-60%, mild MR;  d. TEE 2/12: EF 40%, Large PFO  . Hypertension   . Atrial fibrillation     s/p RFCA (pulmonary vein isolation) 08/2010 (Dr. Johney Krause)  . 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 Total abdominal hysterectomy    . Appendectomy    . Cholecystectomy    . Spinal cyst removed    . Coronary stent placement    . Atrial fibrillation ablation      by [facility] 2/12    Current Outpatient Prescriptions  Medication Sig Dispense Refill  . aspirin EC 81 MG tablet Take 1 tablet (81 mg total) by mouth daily.  1 tablet  0  . dabigatran (PRADAXA) 150 MG CAPS Take 1 capsule (150 mg total) by mouth every 12 (twelve) hours.  180 capsule  3  . lisinopril (PRINIVIL,ZESTRIL) 10 MG tablet TAKE 1 TABLET DAILY  90 tablet  2  . metoprolol succinate (TOPROL-XL) 25 MG 24 hr tablet Take 1 tablet (25 mg total) by mouth daily.  90 tablet  3  . nitroGLYCERIN (NITROSTAT) 0.4 MG SL tablet Place 0.4 mg under the tongue every 5 (five) minutes as needed. For chest pain      . potassium chloride (KLOR-CON) 10 MEQ CR tablet Take 20 mEq by mouth every morning.       . rosuvastatin (CRESTOR) 40 MG tablet Take 40 mg by mouth  daily.        . traZODone (DESYREL) 50 MG tablet Take 100 mg by mouth at bedtime.       No current facility-administered medications for this visit.    No Known Allergies  History   Social History  . Marital Status: Married    Spouse Name: N/A    Number of Children: N/A  . Years of Education: N/A   Occupational History  . retired    Social History Main Topics  . Smoking status: Former Smoker -- 1.00 packs/day for 20 years    Quit date: 07/02/1989  . Smokeless tobacco: Not on file  . Alcohol Use: 0.0 oz/week     Comment: wine occasionally  . Drug Use: No  . Sexually Active: Not on file   Other Topics Concern  . Not on file   Social History Narrative  . No narrative on file    Family History  Problem Relation Age of Onset  . Coronary artery disease Father   . Heart disease Sister     Physical Exam: Filed Vitals:   02/09/13 1403  BP: 102/62  Pulse: 64  Height: 5\' 3"  (1.6 m)  Weight: 173 lb (78.472 kg)  GEN- The patient is well appearing, alert and oriented x 3 today.   Head- normocephalic, atraumatic Eyes-  Sclera clear, conjunctiva pink Ears- hearing intact Oropharynx- clear Neck- supple, no JVP Lymph- no cervical lymphadenopathy Lungs- Clear to ausculation bilaterally, normal work of breathing Heart- Regular rate and rhythm, no murmurs, rubs or gallops, PMI not laterally displaced GI- soft, NT, ND, + BS Extremities- no clubbing, cyanosis, or edema MS- no significant deformity or atrophy Skin- no rash or lesion Psych- euthymic mood, full affect Neuro- strength and sensation are intact  Holter monitor 04/02/12- 48 hour event monitor reveals no afib Echo 3/13 reviewed with the patient Cath 05/09/12 reviewed  ekg today reveals sinus rhythm 64 bpm, otherwise normal ekg  Assessment and Plan:   1. afib Well controlled Continue pradaxa for stroke prevention  2. CAD No ischemic symptoms No changes  3. HL Stable No change required today  4.  HTN Stable No change required today  Return to see [name] in 6 months I will see in a year

## 2013-02-13 ENCOUNTER — Ambulatory Visit: Payer: Medicare Other | Admitting: Internal Medicine

## 2013-04-27 ENCOUNTER — Other Ambulatory Visit: Payer: Self-pay | Admitting: Internal Medicine

## 2013-05-26 ENCOUNTER — Ambulatory Visit: Payer: Medicare Other | Admitting: Nurse Practitioner

## 2013-05-27 ENCOUNTER — Encounter: Payer: Self-pay | Admitting: Physician Assistant

## 2013-05-27 ENCOUNTER — Ambulatory Visit (INDEPENDENT_AMBULATORY_CARE_PROVIDER_SITE_OTHER): Payer: Medicare Other | Admitting: Physician Assistant

## 2013-05-27 VITALS — BP 108/56 | HR 63 | Ht 63.0 in | Wt 168.4 lb

## 2013-05-27 DIAGNOSIS — R0602 Shortness of breath: Secondary | ICD-10-CM

## 2013-05-27 DIAGNOSIS — I4891 Unspecified atrial fibrillation: Secondary | ICD-10-CM

## 2013-05-27 DIAGNOSIS — R05 Cough: Secondary | ICD-10-CM

## 2013-05-27 DIAGNOSIS — I2510 Atherosclerotic heart disease of native coronary artery without angina pectoris: Secondary | ICD-10-CM

## 2013-05-27 DIAGNOSIS — I10 Essential (primary) hypertension: Secondary | ICD-10-CM

## 2013-05-27 DIAGNOSIS — R079 Chest pain, unspecified: Secondary | ICD-10-CM

## 2013-05-27 DIAGNOSIS — R002 Palpitations: Secondary | ICD-10-CM

## 2013-05-27 LAB — BASIC METABOLIC PANEL
BUN: 17 mg/dL (ref 6–23)
Calcium: 9 mg/dL (ref 8.4–10.5)
Chloride: 103 mEq/L (ref 96–112)
Creatinine, Ser: 1.3 mg/dL — ABNORMAL HIGH (ref 0.4–1.2)
GFR: 44.3 mL/min — ABNORMAL LOW (ref >60.00)

## 2013-05-27 LAB — BRAIN NATRIURETIC PEPTIDE: Pro B Natriuretic peptide (BNP): 39 pg/mL (ref 0.0–100.0)

## 2013-05-27 LAB — CBC
HCT: 33.1 % — ABNORMAL LOW (ref 36.0–46.0)
RDW: 14.9 % — ABNORMAL HIGH (ref 11.5–14.6)
WBC: 4.9 10*3/uL (ref 4.5–10.5)

## 2013-05-27 MED ORDER — PANTOPRAZOLE SODIUM 20 MG PO TBEC: 20.0000 mg | DELAYED_RELEASE_TABLET | Freq: Every day | ORAL | Status: DC

## 2013-05-27 MED ORDER — METOPROLOL SUCCINATE ER 25 MG PO TB24: 25.0000 mg | ORAL_TABLET | Freq: Two times a day (BID) | ORAL | Status: DC

## 2013-05-27 NOTE — Patient Instructions (Signed)
Your physician recommends that you return for lab work in: today bmet cbc bnp  Your physician has recommended you make the following change in your medication:   Increase toprol xl to 25 mg twice a day 12 hours apart Start prilosec 20 mg daily  Your physician has requested that you have a lexiscan myoview. . Please follow instruction sheet, as given.  Your physician has recommended that you wear an event monitor. Event monitors are medical devices that record the heart's electrical activity. Doctors most often [place] these monitors to diagnose arrhythmias. Arrhythmias are problems with the speed or rhythm of the heartbeat. The monitor is a small, portable device. You can wear one while you do your normal daily activities. This is usually used to diagnose what is causing palpitations/syncope (passing out).  A chest x-ray takes a picture of the organs and structures inside the chest, including the heart, lungs, and blood vessels. This test can show several things, including, whether the heart is enlarges; whether fluid is building up in the lungs; and whether pacemaker / defibrillator leads are still in place.   Your physician recommends that you schedule a follow-up appointment in: 4-5 weeks with Dr Johney Frame.

## 2013-05-27 NOTE — Progress Notes (Signed)
[address], [address] [place], [place]  [address] Phone: [phone] Fax:  [phone]  Date:  05/27/2013   ID:  [name], DOB 1945-11-23, MRN [id number]  PCP:  Dalbert Mayotte, MD  Cardiologist:  Dr. Hillis Range     History of Present Illness: [name] is a 67 y.o. female with a history of CAD, status post DES to the RCA 04/2009, paroxysmal atrial fibrillation, status post PVI ablation 08/2010, T2DM, HTN, HL. Echo (08/2011): EF 55-65%, grade 1 diastolic dysfunction.  Event and Holter monitors (04/2012): No atrial fibrillation.  Lexiscan Myoview 05/2012: EF 69%, no ischemia. LHC (05/2012):  Ostial diagonal 30-40%, proximal mid ramus intermedius 40-50%, RCA stent patent with 40-50% after stent, then 40%, distal RCA 40-50%, EF 55-65%. Medical therapy continued.  Last seen by Dr. Johney Frame 01/2013.  She presents today with complaints of increased dyspnea over the last month. It seems be getting more frequent. She notes significant dyspnea with minimal activity. She is probably describing NYHA class IIb-III symptoms. She has been sleeping on 2 pillows. She notes occasional PND associated with rapid palpitations. She has some chest pressure with this. She also notes jaw pain at times with activity. She denies syncope. She has had some dizziness with standing quickly. Some of her symptoms remind her of her previous atrial fibrillation. She also notes some symptoms of her reminiscent of her previous angina. Of note, she did run out of her PPI several months ago. She has been unable to get it filled. She notes increased indigestion. However, her symptoms do not seem to be associated with indigestion.  Recent Labs: No results found for requested labs within last 365 days.  Wt Readings from Last 3 Encounters:  05/27/13 168 lb 6.4 oz (76.386 kg)  02/09/13 173 lb (78.472 kg)  08/28/12 175 lb 9.6 oz (79.652 kg)     Past Medical History  Diagnosis Date  . Diabetes mellitus   . Hyperlipidemia     . Coronary artery disease     a.  s/p Xience DES to RCA 04/2009;   b. TEE 2/12: EF 40%, Large PFO;  c.  Lexiscan Myoview 05/2012: EF 69%, no ischemia. LHC (05/2012):  Ostial diagonal 30-40%, proximal mid ramus intermedius 40-50%, RCA stent patent with 40-50% after stent, then 40%, distal RCA 40-50%, EF 55-65%. Medical therapy continued.  . Hypertension   . BCC (basal cell carcinoma of skin)     dm2  . Hx of echocardiogram     a. Echo (08/2011): EF 55-65%, grade 1 diastolic dysfunction.   . Atrial fibrillation     s/p RFCA (pulmonary vein isolation) 08/2010 (Dr. Johney Frame);  Event and Holter monitors (04/2012): No atrial fibrillation.  . Frequent UTI     on keflex for prophylaxis    Current Outpatient Prescriptions  Medication Sig Dispense Refill  . alosetron (LOTRONEX) 0.5 MG tablet Take 0.5 mg by mouth 2 (two) times daily.      [name] aspirin EC 81 MG tablet Take 1 tablet (81 mg total) by mouth daily.  1 tablet  0  . cephALEXin (KEFLEX) 500 MG capsule Take 500 mg by mouth at bedtime.      . dabigatran (PRADAXA) 150 MG CAPS Take 1 capsule (150 mg total) by mouth every 12 (twelve) hours.  180 capsule  3  . lisinopril (PRINIVIL,ZESTRIL) 10 MG tablet TAKE 1 TABLET DAILY  90 tablet  2  . metoprolol succinate (TOPROL-XL) 25 MG 24 hr tablet TAKE 1 TABLET DAILY  90 tablet  2  . nitroGLYCERIN (NITROSTAT) 0.4 MG SL tablet Place 0.4 mg under the tongue every 5 (five) minutes as needed. For chest pain      . potassium chloride (KLOR-CON) 10 MEQ CR tablet Take 20 mEq by mouth every morning.       . rosuvastatin (CRESTOR) 40 MG tablet Take 40 mg by mouth daily.        . traZODone (DESYREL) 50 MG tablet Take 100 mg by mouth at bedtime.       No current facility-administered medications for this visit.    Allergies:   Review of patient's allergies indicates no known allergies.   Social History:  The patient  reports that she quit smoking about 23 years ago. She does not have any smokeless tobacco history on  file. She reports that she drinks alcohol. She reports that she does not use illicit drugs.   Family History:  The patient's family history includes Coronary artery disease in her father; Heart disease in her sister.   ROS:  Please see the history of present illness.   She has had a NP cough.  No fevers.   All other systems reviewed and negative.   PHYSICAL EXAM: VS:  BP 108/56  Pulse 63  Ht 5\' 3"  (1.6 m)  Wt 168 lb 6.4 oz (76.386 kg)  BMI 29.84 kg/m2 Well nourished, well developed, in no acute distress HEENT: normal Neck: no JVD Cardiac:  normal S1, S2; RRR; no murmur Lungs:  ? R basilar crackles, o/w clear without wheezing Abd: soft, nontender, no hepatomegaly Ext: no edema Skin: warm and dry Neuro:  CNs 2-12 intact, no focal abnormalities noted  EKG:  NSR, HR 63, low voltage     ASSESSMENT AND PLAN:  1. Dyspnea: Etiology not entirely clear. She describes some symptoms that sound consistent with congestive heart failure. However, on exam she appears to be euvolemic.  She also has some chest discomfort. With a prior history of CAD, ischemia is a potential cause as well. She has had some exertional symptoms as well as jaw discomfort. She has also had some recurrent palpitations that remind her of her atrial fibrillation. These palpitations have been fairly quiescent since her ablation. She notes a cough and does have a questionable finding on her lung exam as well. She also ran out of her PPI several months ago. I will have her undergo a chest x-ray, CBC, basic metabolic panel and BNP. 2. CAD: With her recent exertional dyspnea and chest discomfort, I have recommended that she undergo Lexiscan Myoview to rule out ischemia. Continue aspirin, statin, beta blocker. 3. Atrial Fibrillation: With her recent symptoms of rapid palpitations, recurrent paroxysmal atrial fibrillation is certainly a consideration. Her symptoms are infrequent.  Therefore, a Holter monitor would not be adequate. I will  place her on a 21 day event monitor. I have asked her to increase her Toprol to 25 mg twice a day to see if this helps her symptoms. She remains on Pradaxa. Check a CBC today. 4. GERD: I have given her a prescription for Prilosec 20 mg daily. 5. Hypertension: Controlled. 6. Hyperlipidemia: Continue statin. 7. Disposition: Follow up with Dr. Johney Frame in 4-5 weeks.  Signed, Tereso Newcomer, PA-C  05/27/2013 9:15 AM

## 2013-06-03 ENCOUNTER — Encounter: Payer: Self-pay | Admitting: *Deleted

## 2013-06-03 ENCOUNTER — Encounter (INDEPENDENT_AMBULATORY_CARE_PROVIDER_SITE_OTHER): Payer: Medicare Other

## 2013-06-03 DIAGNOSIS — I2510 Atherosclerotic heart disease of native coronary artery without angina pectoris: Secondary | ICD-10-CM

## 2013-06-03 DIAGNOSIS — R079 Chest pain, unspecified: Secondary | ICD-10-CM

## 2013-06-03 DIAGNOSIS — R05 Cough: Secondary | ICD-10-CM

## 2013-06-03 DIAGNOSIS — R0602 Shortness of breath: Secondary | ICD-10-CM

## 2013-06-03 DIAGNOSIS — R002 Palpitations: Secondary | ICD-10-CM

## 2013-06-03 DIAGNOSIS — I10 Essential (primary) hypertension: Secondary | ICD-10-CM

## 2013-06-03 DIAGNOSIS — I4891 Unspecified atrial fibrillation: Secondary | ICD-10-CM

## 2013-06-03 NOTE — Progress Notes (Signed)
Patient ID: [name], female   DOB: 04-02-1946, 67 y.o.   MRN: [id number] [employer] 30 day cardiac event monitor applied to patient.

## 2013-06-15 ENCOUNTER — Encounter: Payer: Self-pay | Admitting: Cardiovascular Disease

## 2013-06-15 ENCOUNTER — Ambulatory Visit (HOSPITAL_COMMUNITY): Payer: Medicare Other | Attending: Cardiovascular Disease | Admitting: Radiology

## 2013-06-15 VITALS — BP 103/59 | Ht 63.0 in | Wt 166.0 lb

## 2013-06-15 DIAGNOSIS — R0789 Other chest pain: Secondary | ICD-10-CM

## 2013-06-15 DIAGNOSIS — I10 Essential (primary) hypertension: Secondary | ICD-10-CM

## 2013-06-15 DIAGNOSIS — R05 Cough: Secondary | ICD-10-CM

## 2013-06-15 DIAGNOSIS — R0602 Shortness of breath: Secondary | ICD-10-CM

## 2013-06-15 DIAGNOSIS — I4891 Unspecified atrial fibrillation: Secondary | ICD-10-CM

## 2013-06-15 DIAGNOSIS — R0609 Other forms of dyspnea: Secondary | ICD-10-CM

## 2013-06-15 DIAGNOSIS — R079 Chest pain, unspecified: Secondary | ICD-10-CM

## 2013-06-15 DIAGNOSIS — R002 Palpitations: Secondary | ICD-10-CM

## 2013-06-15 DIAGNOSIS — R059 Cough, unspecified: Secondary | ICD-10-CM

## 2013-06-15 DIAGNOSIS — I2510 Atherosclerotic heart disease of native coronary artery without angina pectoris: Secondary | ICD-10-CM

## 2013-06-15 MED ORDER — TECHNETIUM TC 99M SESTAMIBI GENERIC - CARDIOLITE
30.0000 | Freq: Once | INTRAVENOUS | Status: AC | PRN
Start: 2013-06-15 — End: 2013-06-15
  Administered 2013-06-15: 30 via INTRAVENOUS

## 2013-06-15 MED ORDER — REGADENOSON 0.4 MG/5ML IV SOLN
0.4000 mg | Freq: Once | INTRAVENOUS | Status: AC
  Administered 2013-06-15: 0.4 mg via INTRAVENOUS

## 2013-06-15 MED ORDER — TECHNETIUM TC 99M SESTAMIBI GENERIC - CARDIOLITE
10.0000 | Freq: Once | INTRAVENOUS | Status: AC | PRN
  Administered 2013-06-15: 10 via INTRAVENOUS

## 2013-06-15 NOTE — Progress Notes (Signed)
[facility] SITE 3 NUCLEAR MED [address] [place], [place] [address] [phone]    Cardiology Nuclear Med Study  [name] is a 67 y.o. female     MRN : [id number]     DOB: 02-06-1946  Procedure Date: 06/15/2013  Nuclear Med Background Indication for Stress Test:  Evaluation for Ischemia and Stent Patency History:  CAD; RCA Stent; MI; '13 MPI EF 69% (-); ECHO '13 EF 55-65%; AFIB> Abalation Cardiac Risk Factors: Family History - CAD, History of Smoking, Hypertension and Lipids  Symptoms:  DOE and Palpitations   Nuclear Pre-Procedure Caffeine/Decaff Intake:  None >12 hours NPO After: 5:30PM   Lungs:  clear O2 Sat: 98% on room air. IV 0.9% NS with Angio Cath:  22g  IV Site: R Antecubital  IV Started by:  Rickard Patience  Chest Size (in):  38 Cup Size: D  Height: 5\' 3"  (1.6 m)  Weight:  166 lb (75.297 kg)  BMI:  Body mass index is 29.41 kg/(m^2). Tech Comments:  n/a    Nuclear Med Study 1 or 2 day study: 1 day  Stress Test Type:  Lexiscan  Reading MD: Willa Rough, MD  Order Authorizing Provider:  Jarold Song  Resting Radionuclide: Technetium 20m Sestamibi  Resting Radionuclide Dose: 11.0 mCi   Stress Radionuclide:  Technetium 26m Sestamibi  Stress Radionuclide Dose: 33.0 mCi           Stress Protocol Rest HR: 59 Stress HR: 75  Rest BP: 103/59 Stress BP: 106/54  Exercise Time (min): n/a METS: n/a   Predicted Max HR: 151 bpm % Max HR: 70.2 bpm Rate Pressure Product: 21308   Dose of Adenosine (mg):  n/a Dose of Lexiscan: 0.4 mg  Dose of Atropine (mg): n/a Dose of Dobutamine: n/a mcg/kg/min (at max HR)  Stress Test Technologist: Frederick Peers, EMT-P  Nuclear Technologist:  Harlow Asa, CNMT     Rest Procedure:  Myocardial perfusion imaging was performed at rest 45 minutes following the intravenous administration of Technetium 50m Sestamibi. Rest ECG: NSR - Normal EKG  Stress Procedure:  The patient received IV Lexiscan 0.4 mg over  15-seconds.  Technetium 40m Sestamibi injected at 30-seconds.  Quantitative spect images were obtained after a 45 minute delay. Stress ECG: No significant change from baseline ECG  QPS Raw Data Images:  Normal; no motion artifact; normal heart/lung ratio. Stress Images:  Normal homogeneous uptake in all areas of the myocardium. Rest Images:  Normal homogeneous uptake in all areas of the myocardium. Subtraction (SDS):  No evidence of ischemia. Transient Ischemic Dilatation (Normal <1.22):  0.83 Lung/Heart Ratio (Normal <0.45):  0.43  Quantitative Gated Spect Images QGS EDV:  53 ml QGS ESV:  09 ml  Impression Exercise Capacity:  Lexiscan with no exercise. BP Response:  Normal blood pressure response. Clinical Symptoms:  No significant symptoms noted. ECG Impression:  No significant ST segment change suggestive of ischemia. Comparison with Prior Nuclear Study: No changes from  previous nuclear study performed 05/06/12.    Overall Impression:  Normal stress nuclear study.  LV Ejection Fraction: 83%.  LV Wall Motion:  NL LV Function; NL Wall Motion.   [name], Montez Hageman., MD, [facility] 06/15/2013, 4:35 PM Office - [phone] Pager [phone]

## 2013-06-16 ENCOUNTER — Telehealth: Payer: Self-pay | Admitting: *Deleted

## 2013-06-16 ENCOUNTER — Encounter: Payer: Self-pay | Admitting: Physician Assistant

## 2013-06-16 NOTE — Telephone Encounter (Signed)
ptcb and has been notified about normal myoview results with verbal understanding 

## 2013-06-16 NOTE — Telephone Encounter (Signed)
Follow up    Returned our call for results.

## 2013-06-16 NOTE — Telephone Encounter (Signed)
No answer. I will try again later to reach pt about test results

## 2013-07-05 ENCOUNTER — Encounter (HOSPITAL_COMMUNITY): Payer: Self-pay | Admitting: Emergency Medicine

## 2013-07-05 ENCOUNTER — Inpatient Hospital Stay (HOSPITAL_COMMUNITY)
Admission: EM | Admit: 2013-07-05 | Discharge: 2013-07-07 | DRG: 379 | Disposition: A | Discharge status: Home / Self Care | Payer: Medicare Other | Attending: Family Medicine | Admitting: Family Medicine

## 2013-07-05 DIAGNOSIS — I4891 Unspecified atrial fibrillation: Secondary | ICD-10-CM

## 2013-07-05 DIAGNOSIS — R197 Diarrhea, unspecified: Secondary | ICD-10-CM | POA: Diagnosis present

## 2013-07-05 DIAGNOSIS — I2510 Atherosclerotic heart disease of native coronary artery without angina pectoris: Secondary | ICD-10-CM

## 2013-07-05 DIAGNOSIS — K922 Gastrointestinal hemorrhage, unspecified: Principal | ICD-10-CM

## 2013-07-05 DIAGNOSIS — Z87440 Personal history of urinary (tract) infections: Secondary | ICD-10-CM

## 2013-07-05 DIAGNOSIS — K625 Hemorrhage of anus and rectum: Secondary | ICD-10-CM | POA: Diagnosis present

## 2013-07-05 DIAGNOSIS — R7301 Impaired fasting glucose: Secondary | ICD-10-CM | POA: Diagnosis present

## 2013-07-05 DIAGNOSIS — Z87891 Personal history of nicotine dependence: Secondary | ICD-10-CM

## 2013-07-05 DIAGNOSIS — Z79899 Other long term (current) drug therapy: Secondary | ICD-10-CM

## 2013-07-05 DIAGNOSIS — E119 Type 2 diabetes mellitus without complications: Secondary | ICD-10-CM

## 2013-07-05 DIAGNOSIS — R0602 Shortness of breath: Secondary | ICD-10-CM

## 2013-07-05 DIAGNOSIS — R12 Heartburn: Secondary | ICD-10-CM

## 2013-07-05 DIAGNOSIS — I200 Unstable angina: Secondary | ICD-10-CM

## 2013-07-05 DIAGNOSIS — J984 Other disorders of lung: Secondary | ICD-10-CM

## 2013-07-05 DIAGNOSIS — Z7901 Long term (current) use of anticoagulants: Secondary | ICD-10-CM

## 2013-07-05 DIAGNOSIS — R072 Precordial pain: Secondary | ICD-10-CM

## 2013-07-05 DIAGNOSIS — R609 Edema, unspecified: Secondary | ICD-10-CM

## 2013-07-05 DIAGNOSIS — I10 Essential (primary) hypertension: Secondary | ICD-10-CM

## 2013-07-05 DIAGNOSIS — Z8249 Family history of ischemic heart disease and other diseases of the circulatory system: Secondary | ICD-10-CM

## 2013-07-05 DIAGNOSIS — Z85828 Personal history of other malignant neoplasm of skin: Secondary | ICD-10-CM

## 2013-07-05 DIAGNOSIS — E785 Hyperlipidemia, unspecified: Secondary | ICD-10-CM

## 2013-07-05 LAB — COMPREHENSIVE METABOLIC PANEL
ALT: 21 U/L (ref 0–35)
AST: 28 U/L (ref 0–37)
Albumin: 3.8 g/dL (ref 3.5–5.2)
Alkaline Phosphatase: 48 U/L (ref 39–117)
BUN: 21 mg/dL (ref 6–23)
CALCIUM: 8.9 mg/dL (ref 8.4–10.5)
CO2: 25 meq/L (ref 19–32)
CREATININE: 1.4 mg/dL — AB (ref 0.50–1.10)
Chloride: 104 mEq/L (ref 96–112)
GFR, EST AFRICAN AMERICAN: 43 mL/min — AB (ref >90)
GFR, EST NON AFRICAN AMERICAN: 37 mL/min — AB (ref >90)
GLUCOSE: 151 mg/dL — AB (ref 70–99)
Potassium: 3.8 mEq/L (ref 3.7–5.3)
SODIUM: 141 meq/L (ref 137–147)
TOTAL PROTEIN: 6.2 g/dL (ref 6.0–8.3)
Total Bilirubin: 0.2 mg/dL — ABNORMAL LOW (ref 0.3–1.2)

## 2013-07-05 LAB — CBC WITH DIFFERENTIAL/PLATELET
Basophils Absolute: 0 10*3/uL (ref 0.0–0.1)
Basophils Relative: 0 % (ref 0–1)
EOS ABS: 0.1 10*3/uL (ref 0.0–0.7)
EOS PCT: 1 % (ref 0–5)
HEMATOCRIT: 35.1 % — AB (ref 36.0–46.0)
Hemoglobin: 11.7 g/dL — ABNORMAL LOW (ref 12.0–15.0)
LYMPHS ABS: 0.7 10*3/uL (ref 0.7–4.0)
LYMPHS PCT: 9 % — AB (ref 12–46)
MCH: 32.1 pg (ref 26.0–34.0)
MCHC: 33.3 g/dL (ref 30.0–36.0)
MCV: 96.4 fL (ref 78.0–100.0)
MONO ABS: 0.5 10*3/uL (ref 0.1–1.0)
Monocytes Relative: 6 % (ref 3–12)
Neutro Abs: 6.8 10*3/uL (ref 1.7–7.7)
Neutrophils Relative %: 84 % — ABNORMAL HIGH (ref 43–77)
PLATELETS: 157 10*3/uL (ref 150–400)
RBC: 3.64 MIL/uL — AB (ref 3.87–5.11)
RDW: 13.2 % (ref 11.5–15.5)
WBC: 8 10*3/uL (ref 4.0–10.5)

## 2013-07-05 LAB — PROTIME-INR
INR: 1.49 (ref 0.00–1.49)
Prothrombin Time: 17.6 seconds — ABNORMAL HIGH (ref 11.6–15.2)

## 2013-07-05 LAB — APTT: aPTT: 49 seconds — ABNORMAL HIGH (ref 24–37)

## 2013-07-05 MED ORDER — SODIUM CHLORIDE 0.9 % IV SOLN
1000.0000 mL | INTRAVENOUS | Status: DC
  Administered 2013-07-05: 1000 mL via INTRAVENOUS

## 2013-07-05 MED ORDER — ONDANSETRON HCL 4 MG/2ML IJ SOLN
4.0000 mg | Freq: Once | INTRAMUSCULAR | Status: AC
  Administered 2013-07-05: 4 mg via INTRAVENOUS
  Filled 2013-07-05: qty 2

## 2013-07-05 NOTE — ED Notes (Signed)
MD at bedside. 

## 2013-07-05 NOTE — ED Notes (Signed)
Patient with c/o rectal bleeding Patient states that bleeding started around 1830 tonight  Patient currently on Pradaxa  Patient states that when she was in the shower, "It was running down my leg." Scant amount of blood noted to Depends Patient alert and oriented x 4 and appears in NAD

## 2013-07-05 NOTE — ED Provider Notes (Signed)
CSN: [id number]     Arrival date & time 07/05/13  2058 History   First MD Initiated Contact with Patient 07/05/13 2119     Chief Complaint  Patient presents with  . Rectal Bleeding    HPI The patient presents to the emergency room with complaints of rectal bleeding. Patient states starting around 6:30 PM after having a bowel movement she noticed bright red blood from her rectum. The patient started having severe lower abdominal cramping as well.  Patient has had a few more episodes since the initial episode. She has not had prior episodes of rectal bleeding. She's not having any trouble with nausea or vomiting. Patient denies any rectal pain. 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 She currently is taking Pradaxa for atrial fibrillation Past Medical History  Diagnosis Date  . Diabetes mellitus   . Hyperlipidemia   . Coronary artery disease     a.  s/p Xience DES to RCA 04/2009;   b. TEE 2/12: EF 40%, Large PFO;  c.  Lexiscan Myoview 05/2012: EF 69%, no ischemia. LHC (05/2012):  Ostial diagonal 30-40%, proximal mid ramus intermedius 40-50%, RCA stent patent with 40-50% after stent, then 40%, distal RCA 40-50%, EF 55-65%. Medical therapy continued.;  d.  Carlton Adam Myoview (06/2013):  No ischemia, EF 83%, normal study  . Hypertension   . BCC (basal cell carcinoma of skin)     dm2  . Hx of echocardiogram     a. Echo (08/2011): EF 58-83%, grade 1 diastolic dysfunction.   . Atrial fibrillation     s/p RFCA (pulmonary vein isolation) 08/2010 (Dr. Rayann Heman);  Event and Holter monitors (04/2012): No atrial fibrillation.  . Frequent UTI     on keflex for prophylaxis   Past Surgical History  Procedure Laterality Date  . Total abdominal hysterectomy    . Appendectomy    . Cholecystectomy    . Spinal cyst removed    . Coronary stent placement    . Atrial fibrillation ablation      by JA 2/12   Family History  Problem Relation Age of Onset  .  Coronary artery disease Father   . Heart disease Sister    History  Substance Use Topics  . Smoking status: Former Smoker -- 1.00 packs/day for 20 years    Quit date: 07/02/1989  . Smokeless tobacco: Not on file  . Alcohol Use: 0.0 oz/week     Comment: wine occasionally   OB History   Grav Para Term Preterm Abortions TAB SAB Ect Mult Living                 Review of Systems  All other systems reviewed and are negative.    Allergies  Review of patient's allergies indicates no known allergies.  Home Medications   Current Outpatient Rx  Name  Route  Sig  Dispense  Refill  . alosetron (LOTRONEX) 0.5 MG tablet   Oral   Take 0.5 mg by mouth 2 (two) times daily.         [name] aspirin EC 81 MG tablet   Oral   Take 1 tablet (81 mg total) by mouth daily.   1 tablet   0   . cephALEXin (KEFLEX) 500 MG capsule   Oral   Take 500 mg by mouth at bedtime.         . dabigatran (PRADAXA) 150 MG CAPS   Oral   Take  1 capsule (150 mg total) by mouth every 12 (twelve) hours.   180 capsule   3   . lisinopril (PRINIVIL,ZESTRIL) 10 MG tablet      TAKE 1 TABLET DAILY   90 tablet   2   . metoprolol succinate (TOPROL-XL) 25 MG 24 hr tablet   Oral   Take 1 tablet (25 mg total) by mouth 2 (two) times daily. 12 hours apart   180 tablet   3     Increased dose   . nitroGLYCERIN (NITROSTAT) 0.4 MG SL tablet   Sublingual   Place 0.4 mg under the tongue every 5 (five) minutes as needed. For chest pain         . pantoprazole (PROTONIX) 20 MG tablet   Oral   Take 1 tablet (20 mg total) by mouth daily.   90 tablet   3   . potassium chloride (KLOR-CON) 10 MEQ CR tablet   Oral   Take 20 mEq by mouth every morning.          . rosuvastatin (CRESTOR) 40 MG tablet   Oral   Take 40 mg by mouth daily.           . traZODone (DESYREL) 50 MG tablet   Oral   Take 100 mg by mouth at bedtime.          BP 126/61  Pulse 63  Temp(Src) 97.9 F (36.6 C) (Oral)  Resp 10  Ht 5'  2" (1.575 m)  Wt 160 lb (72.576 kg)  BMI 29.26 kg/m2  SpO2 97% Physical Exam  Nursing note and vitals reviewed. Constitutional: She appears well-developed and well-nourished. No distress.  HENT:  Head: Normocephalic and atraumatic.  Right Ear: External ear normal.  Left Ear: External ear normal.  Eyes: Conjunctivae are normal. Right eye exhibits no discharge. Left eye exhibits no discharge. No scleral icterus.  Neck: Neck supple. No tracheal deviation present.  Cardiovascular: Normal rate, regular rhythm and intact distal pulses.   Pulmonary/Chest: Effort normal and breath sounds normal. No stridor. No respiratory distress. She has no wheezes. She has no rales.  Abdominal: Soft. Bowel sounds are normal. She exhibits no distension and no mass. There is tenderness ( Bilateral lower abdomen). There is no rebound and no guarding. No hernia.     Genitourinary: Rectal exam shows no mass and no tenderness.  Firm stool in the rectal vault, red mucoid blood on rectal exam, dried blood in the perianal region  Musculoskeletal: She exhibits no edema and no tenderness.  Neurological: She is alert. She has normal strength. No sensory deficit. Cranial nerve deficit:  no gross defecits noted. She exhibits normal muscle tone. She displays no seizure activity. Coordination normal.  Skin: Skin is warm and dry. No rash noted.  Psychiatric: She has a normal mood and affect.    ED Course  Procedures (including critical care time) Labs Review Labs Reviewed  CBC WITH DIFFERENTIAL - Abnormal; Notable for the following:    RBC 3.64 (*)    Hemoglobin 11.7 (*)    HCT 35.1 (*)    Neutrophils Relative % 84 (*)    Lymphocytes Relative 9 (*)    All other components within normal limits  COMPREHENSIVE METABOLIC PANEL - Abnormal; Notable for the following:    Glucose, Bld 151 (*)    Creatinine, Ser 1.40 (*)    Total Bilirubin 0.2 (*)    GFR calc non Af Amer 37 (*)    GFR calc Af Wyvonnia Lora  43 (*)    All other  components within normal limits  APTT - Abnormal; Notable for the following:    aPTT 49 (*)    All other components within normal limits  PROTIME-INR - Abnormal; Notable for the following:    Prothrombin Time 17.6 (*)    All other components within normal limits  TYPE AND SCREEN  ABO/RH   Imaging Review No results found.  EKG Interpretation   None       MDM   1. GI bleeding    Labs are stable however pt is on pradaxa.  Will admit for observation, serial HCT.    Kathalene Frames, MD 07/05/13 2352

## 2013-07-05 NOTE — ED Notes (Signed)
POC Occult Blood POSITIVE. RN made aware.

## 2013-07-06 ENCOUNTER — Encounter (HOSPITAL_COMMUNITY): Payer: Self-pay | Admitting: Internal Medicine

## 2013-07-06 DIAGNOSIS — K625 Hemorrhage of anus and rectum: Secondary | ICD-10-CM | POA: Diagnosis present

## 2013-07-06 DIAGNOSIS — I10 Essential (primary) hypertension: Secondary | ICD-10-CM

## 2013-07-06 DIAGNOSIS — K922 Gastrointestinal hemorrhage, unspecified: Principal | ICD-10-CM

## 2013-07-06 DIAGNOSIS — E119 Type 2 diabetes mellitus without complications: Secondary | ICD-10-CM

## 2013-07-06 DIAGNOSIS — I4891 Unspecified atrial fibrillation: Secondary | ICD-10-CM

## 2013-07-06 LAB — COMPREHENSIVE METABOLIC PANEL
ALT: 18 U/L (ref 0–35)
AST: 23 U/L (ref 0–37)
Albumin: 3.4 g/dL — ABNORMAL LOW (ref 3.5–5.2)
Alkaline Phosphatase: 47 U/L (ref 39–117)
BILIRUBIN TOTAL: 0.3 mg/dL (ref 0.3–1.2)
BUN: 19 mg/dL (ref 6–23)
CHLORIDE: 107 meq/L (ref 96–112)
CO2: 24 mEq/L (ref 19–32)
Calcium: 8.6 mg/dL (ref 8.4–10.5)
Creatinine, Ser: 1.37 mg/dL — ABNORMAL HIGH (ref 0.50–1.10)
GFR calc Af Amer: 44 mL/min — ABNORMAL LOW (ref >90)
GFR calc non Af Amer: 38 mL/min — ABNORMAL LOW (ref >90)
Glucose, Bld: 98 mg/dL (ref 70–99)
Potassium: 3.7 mEq/L (ref 3.7–5.3)
Sodium: 142 mEq/L (ref 137–147)
Total Protein: 5.5 g/dL — ABNORMAL LOW (ref 6.0–8.3)

## 2013-07-06 LAB — CBC
HCT: 34 % — ABNORMAL LOW (ref 36.0–46.0)
HEMOGLOBIN: 10.9 g/dL — AB (ref 12.0–15.0)
MCH: 31.1 pg (ref 26.0–34.0)
MCHC: 32.1 g/dL (ref 30.0–36.0)
MCV: 96.9 fL (ref 78.0–100.0)
PLATELETS: 130 10*3/uL — AB (ref 150–400)
RBC: 3.51 MIL/uL — ABNORMAL LOW (ref 3.87–5.11)
RDW: 13.4 % (ref 11.5–15.5)
WBC: 6.4 10*3/uL (ref 4.0–10.5)

## 2013-07-06 LAB — OCCULT BLOOD, POC DEVICE: Fecal Occult Bld: POSITIVE — AB

## 2013-07-06 LAB — HEMOGLOBIN A1C
Hgb A1c MFr Bld: 5.9 % — ABNORMAL HIGH (ref <5.7)
Mean Plasma Glucose: 123 mg/dL — ABNORMAL HIGH (ref <117)

## 2013-07-06 LAB — OCCULT BLOOD X 1 CARD TO LAB, STOOL: FECAL OCCULT BLD: NEGATIVE

## 2013-07-06 LAB — GLUCOSE, CAPILLARY
GLUCOSE-CAPILLARY: 91 mg/dL (ref 70–99)
GLUCOSE-CAPILLARY: 88 mg/dL (ref 70–99)
GLUCOSE-CAPILLARY: 91 mg/dL (ref 70–99)
GLUCOSE-CAPILLARY: 147 mg/dL — AB (ref 70–99)
Glucose-Capillary: 92 mg/dL (ref 70–99)
Glucose-Capillary: 135 mg/dL — ABNORMAL HIGH (ref 70–99)

## 2013-07-06 LAB — PHOSPHORUS: Phosphorus: 3.2 mg/dL (ref 2.3–4.6)

## 2013-07-06 LAB — MAGNESIUM: MAGNESIUM: 2 mg/dL (ref 1.5–2.5)

## 2013-07-06 LAB — TYPE AND SCREEN
ABO/RH(D): A POS
ANTIBODY SCREEN: NEGATIVE

## 2013-07-06 LAB — ABO/RH: ABO/RH(D): A POS

## 2013-07-06 LAB — TSH: TSH: 2.657 u[IU]/mL (ref 0.350–4.500)

## 2013-07-06 MED ORDER — ACETAMINOPHEN 325 MG PO TABS: 650.0000 mg | ORAL_TABLET | Freq: Four times a day (QID) | ORAL | Status: DC | PRN

## 2013-07-06 MED ORDER — CEPHALEXIN 500 MG PO CAPS
500.0000 mg | ORAL_CAPSULE | Freq: Every day | ORAL | Status: DC
  Administered 2013-07-06 (×2): 500 mg via ORAL
  Filled 2013-07-06 (×3): qty 1

## 2013-07-06 MED ORDER — ONDANSETRON HCL 4 MG/2ML IJ SOLN: 4.0000 mg | Freq: Four times a day (QID) | INTRAMUSCULAR | Status: DC | PRN

## 2013-07-06 MED ORDER — ONDANSETRON HCL 4 MG PO TABS: 4.0000 mg | ORAL_TABLET | Freq: Four times a day (QID) | ORAL | Status: DC | PRN

## 2013-07-06 MED ORDER — ACETAMINOPHEN 650 MG RE SUPP: 650.0000 mg | Freq: Four times a day (QID) | RECTAL | Status: DC | PRN

## 2013-07-06 MED ORDER — INSULIN ASPART 100 UNIT/ML ~~LOC~~ SOLN
0.0000 [IU] | SUBCUTANEOUS | Status: DC
  Administered 2013-07-06: 21:00:00 1 [IU] via SUBCUTANEOUS

## 2013-07-06 MED ORDER — SODIUM CHLORIDE 0.9 % IV SOLN: INTRAVENOUS | Status: DC

## 2013-07-06 MED ORDER — POTASSIUM CHLORIDE CRYS ER 10 MEQ PO TBCR
10.0000 meq | EXTENDED_RELEASE_TABLET | Freq: Every day | ORAL | Status: DC
  Administered 2013-07-06 – 2013-07-07 (×2): 10 meq via ORAL
  Filled 2013-07-06 (×2): qty 1

## 2013-07-06 MED ORDER — ATORVASTATIN CALCIUM 80 MG PO TABS
80.0000 mg | ORAL_TABLET | Freq: Every day | ORAL | Status: DC
  Administered 2013-07-06: 80 mg via ORAL
  Filled 2013-07-06 (×2): qty 1

## 2013-07-06 MED ORDER — HYDROCODONE-ACETAMINOPHEN 5-325 MG PO TABS: 1.0000 | ORAL_TABLET | ORAL | Status: DC | PRN

## 2013-07-06 MED ORDER — SODIUM CHLORIDE 0.9 % IJ SOLN: 3.0000 mL | Freq: Two times a day (BID) | INTRAMUSCULAR | Status: DC

## 2013-07-06 MED ORDER — SODIUM CHLORIDE 0.9 % IV SOLN
1000.0000 mL | INTRAVENOUS | Status: DC
  Administered 2013-07-06 (×3): 1000 mL via INTRAVENOUS

## 2013-07-06 MED ORDER — DOCUSATE SODIUM 100 MG PO CAPS
100.0000 mg | ORAL_CAPSULE | Freq: Two times a day (BID) | ORAL | Status: DC
  Administered 2013-07-06: 100 mg via ORAL
  Filled 2013-07-06 (×4): qty 1

## 2013-07-06 MED ORDER — TRAZODONE HCL 100 MG PO TABS
100.0000 mg | ORAL_TABLET | Freq: Every day | ORAL | Status: DC
  Administered 2013-07-06 (×2): 100 mg via ORAL
  Filled 2013-07-06 (×3): qty 1

## 2013-07-06 MED ORDER — METOPROLOL SUCCINATE ER 25 MG PO TB24
25.0000 mg | ORAL_TABLET | Freq: Two times a day (BID) | ORAL | Status: DC
  Administered 2013-07-06 – 2013-07-07 (×3): 25 mg via ORAL
  Filled 2013-07-06 (×5): qty 1

## 2013-07-06 MED ORDER — PANTOPRAZOLE SODIUM 40 MG IV SOLR
40.0000 mg | Freq: Every day | INTRAVENOUS | Status: DC
  Administered 2013-07-06 (×2): 40 mg via INTRAVENOUS
  Filled 2013-07-06 (×4): qty 40

## 2013-07-06 NOTE — H&P (Signed)
PCP:  [name], MD  Cardiology [facility]  Chief Complaint:  Bright red blood per rectum  HPI: [name] is a 68 y.o. female   has a past medical history of Diabetes mellitus; Hyperlipidemia; Coronary artery disease; Hypertension; BCC (basal cell carcinoma of skin); echocardiogram; Atrial fibrillation; and Frequent UTI.   Presented with  At 6 :30 pm patient went to have a BM and noticed blood in the bowl. When she stood up bright red blood was running down her legs. This has since resolved. Reports episodes of sharp abdominal pain earlier on in the day. Last colonoscopy was in Nov 2013 and was WNL. Denies dizziness or chest pain.  Hg remained at baseline. Hospitalist called for admission.   Review of Systems:    Pertinent positives include: abdominal pain, blood in stool,  Constitutional:  No weight loss, night sweats, Fevers, chills, fatigue, weight loss  HEENT:  No headaches, Difficulty swallowing,Tooth/dental problems,Sore throat,  No sneezing, itching, ear ache, nasal congestion, post nasal drip,  Cardio-vascular:  No chest pain, Orthopnea, PND, anasarca, dizziness, palpitations.no Bilateral lower extremity swelling  GI:  No heartburn, indigestion,  nausea, vomiting, diarrhea, change in bowel habits, loss of appetite, melena,  hematemesis Resp:  no shortness of breath at rest. No dyspnea on exertion, No excess mucus, no productive cough, No non-productive cough, No coughing up of blood.No change in color of mucus.No wheezing. Skin:  no rash or lesions. No jaundice GU:  no dysuria, change in color of urine, no urgency or frequency. No straining to urinate.  No flank pain.  Musculoskeletal:  No joint pain or no joint swelling. No decreased range of motion. No back pain.  Psych:  No change in mood or affect. No depression or anxiety. No memory loss.  Neuro: no localizing neurological complaints, no tingling, no weakness, no double  vision, no gait abnormality, no slurred speech, no confusion  Otherwise ROS are negative except for above, 10 systems were reviewed  Past Medical History: Past Medical History  Diagnosis Date  . Diabetes mellitus   . Hyperlipidemia   . Coronary artery disease     a.  s/p Xience DES to RCA 04/2009;   b. TEE 2/12: EF 40%, Large PFO;  c.  Lexiscan Myoview 05/2012: EF 69%, no ischemia. LHC (05/2012):  Ostial diagonal 30-40%, proximal mid ramus intermedius 40-50%, RCA stent patent with 40-50% after stent, then 40%, distal RCA 40-50%, EF 55-65%. Medical therapy continued.;  d.  Carlton Adam Myoview (06/2013):  No ischemia, EF 83%, normal study  . Hypertension   . BCC (basal cell carcinoma of skin)     dm2  . Hx of echocardiogram     a. Echo (08/2011): EF 25-95%, grade 1 diastolic dysfunction.   . Atrial fibrillation     s/p RFCA (pulmonary vein isolation) 08/2010 (Dr. Rayann Heman);  Event and Holter monitors (04/2012): No atrial fibrillation.  . Frequent UTI     on keflex for prophylaxis   Past Surgical History  Procedure Laterality Date  . Total abdominal hysterectomy    . Appendectomy    . Cholecystectomy    . Spinal cyst removed    . Coronary stent placement    . Atrial fibrillation ablation      by JA 2/12     Medications: Prior to Admission medications   Medication Sig Start Date End Date Taking? Authorizing Provider  alosetron (LOTRONEX) 0.5 MG tablet Take 0.5 mg by mouth 2 (two) times  daily.    Historical Provider, MD  aspirin EC 81 MG tablet Take 1 tablet (81 mg total) by mouth daily. 05/06/12   Hillary Bow, MD  cephALEXin (KEFLEX) 500 MG capsule Take 500 mg by mouth at bedtime.    Historical Provider, MD  dabigatran (PRADAXA) 150 MG CAPS Take 1 capsule (150 mg total) by mouth every 12 (twelve) hours. 09/24/12   Thompson Grayer, MD  lisinopril (PRINIVIL,ZESTRIL) 10 MG tablet TAKE 1 TABLET DAILY 01/05/13   Thompson Grayer, MD  metoprolol succinate (TOPROL-XL) 25 MG 24 hr tablet Take 1  tablet (25 mg total) by mouth 2 (two) times daily. 12 hours apart 05/27/13   Liliane Shi, PA-C  nitroGLYCERIN (NITROSTAT) 0.4 MG SL tablet Place 0.4 mg under the tongue every 5 (five) minutes as needed. For chest pain    Historical Provider, MD  potassium chloride (KLOR-CON) 10 MEQ CR tablet Take 20 mEq by mouth every morning.     Historical Provider, MD  rosuvastatin (CRESTOR) 40 MG tablet Take 40 mg by mouth daily.      Historical Provider, MD  traZODone (DESYREL) 50 MG tablet Take 100 mg by mouth at bedtime.    Historical Provider, MD    Allergies:  No Known Allergies  Social History:  Ambulatory  Independently  Lives at  Home with family   reports that she quit smoking about 24 years ago. She does not have any smokeless tobacco history on file. She reports that she drinks alcohol. She reports that she does not use illicit drugs.   Family History: family history includes Coronary artery disease in her father; Heart disease in her mother and sister.    Physical Exam: Patient Vitals for the past 24 hrs:  BP Temp Temp src Pulse Resp SpO2 Height Weight  07/05/13 2316 126/61 mmHg - - 63 10 97 % - -  07/05/13 2200 - - - 70 9 96 % - -  07/05/13 2110 147/70 mmHg 97.9 F (36.6 C) Oral 71 20 96 % - -  07/05/13 2102 118/72 mmHg 98.2 F (36.8 C) Oral 79 16 98 % 5\' 2"  (1.575 m) 72.576 kg (160 lb)    1. General:  in No Acute distress 2. Psychological: Alert and   Oriented 3. Head/ENT:   Moist  Mucous Membranes                          Head Non traumatic, neck supple                          Normal  Dentition 4. SKIN: normal  Skin turgor,  Skin clean Dry and intact no rash 5. Heart: Regular rate and rhythm no Murmur, Rub or gallop 6. Lungs:  no wheezes but fine crackles  noted 7. Abdomen: Soft, non-tender, Non distended 8. Lower extremities: no clubbing, cyanosis, or edema 9. Neurologically Grossly intact, moving all 4 extremities equally 10. MSK: Normal range of motion Hemoccult  positive stool   body mass index is 29.26 kg/(m^2).   Labs on Admission:   Recent Labs  07/05/13 2150  NA 141  K 3.8  CL 104  CO2 25  GLUCOSE 151*  BUN 21  CREATININE 1.40*  CALCIUM 8.9    Recent Labs  07/05/13 2150  AST 28  ALT 21  ALKPHOS 48  BILITOT 0.2*  PROT 6.2  ALBUMIN 3.8   No results found for this  basename: LIPASE, AMYLASE,  in the last 72 hours  Recent Labs  07/05/13 2150  WBC 8.0  NEUTROABS 6.8  HGB 11.7*  HCT 35.1*  MCV 96.4  PLT 157   No results found for this basename: CKTOTAL, CKMB, CKMBINDEX, TROPONINI,  in the last 72 hours No results found for this basename: TSH, T4TOTAL, FREET3, T3FREE, THYROIDAB,  in the last 72 hours No results found for this basename: VITAMINB12, FOLATE, FERRITIN, TIBC, IRON, RETICCTPCT,  in the last 72 hours Lab Results  Component Value Date   HGBA1C  Value: 6.0 (NOTE) The ADA recommends the following therapeutic goal for glycemic control related to Hgb A1c measurement: Goal of therapy: <6.5 Hgb A1c  Reference: American Diabetes Association: Clinical Practice Recommendations 2010, Diabetes Care, 2010, 33: (Suppl  1). 07/20/2009    Estimated Creatinine Clearance: 35.4 ml/min (by C-G formula based on Cr of 1.4). ABG    Component Value Date/Time   TCO2 26 12/06/2009 1959     Lab Results  Component Value Date   DDIMER <0.27 05/09/2012    Cultures:    Component Value Date/Time   SDES URINE, RANDOM 12/06/2009 2313   SPECREQUEST clean catch 12/06/2009 2313   CULT ESCHERICHIA COLI 12/06/2009 2313   REPTSTATUS 12/08/2009 FINAL 12/06/2009 2313       Radiological Exams on Admission: No results found.  Chart has been reviewed  Assessment/Plan  68 year old female with history of coronary/diet controlled diabetes/atrial fibrillation on anticoagulation here with sudden onset of bright red blood per rectum most likely secondary to lower GI bleed now resolved  Present on Admission:  . Bright red blood per rectum - we'll  admit for further monitoring. Stop Pradaxa. GI consult in a.m. most likely secondary to lower GI bleed  . Atrial fibrillation - rate controlled to continue beta blocker but stopped anticoagulation  . CAD, NATIVE VESSEL - chest pain currently stable continue to monitor  . DIABETES MELLITUS, TYPE II - diet controlled will put on sliding scale while in hospital  . Essential hypertension, benign - continue home medications currently stable     Prophylaxis: SCD   Protonix  CODE STATUS: FULL CODE  Other plan as per orders.  I have spent a total of 55 min on this admission  Tondra Reierson 07/06/2013, 12:03 AM

## 2013-07-06 NOTE — Consult Note (Signed)
[facility] CONSULT Reason for consult: chronic diarrhea and weight loss Referring Physician: [facility]. PCP: Dr. Roswell Miners [facility]  [name] is an 68 y.o. female.  HPI:  she has a history of chronic diarrhea that she has had now for several years.  8 years ago she had 3 bowel movement daily or lose but no real nocturnal stools. This began sometime after her cholecystectomy and have been attributed to her cholecystectomy. Over the past 2 to 3 years, her bowel movements and got progressively worse and over the past 6 months she has had approximately 15 loose watery bowel movements daily with nocturnal stools nearly every night. She has seen a gastroenterologist in [place] Dr. Tamala Julian, who has treated her with anti-spasmodic medications most recently Lotronex. Despite that she has continued to have diarrhea. In 2013 she had colonoscopy that she was told was normal. She is due to see Dr. Tamala Julian back in the next several weeks to perform colonic biopsies. Over the past 6 to 9 months she has lost approximately 20 pounds without really making the effort to do that. The patient is on Pradaxa because of a history of intermittent atrial fibrillation. She was hospitalized after she had what she thought was another episode of fecal incontinence due to her diarrhea that proved to be passage of blood along with loose watery stool. She reports that she had brought red blood running down her legs and felt weak and dizzy. She was placed in a hospital on IV fluids and her prep accent has been on hold. She has been in PO and has noticed that her diarrhea is slowed down considerably. Her hemoglobin has been relatively stable.  Past Medical History  Diagnosis Date  . Diabetes mellitus   . Hyperlipidemia   . Coronary artery disease     a.  s/p Xience DES to RCA 04/2009;   b. TEE 2/12: EF 40%, Large PFO;  c.  Lexiscan Myoview 05/2012: EF 69%, no ischemia. LHC (05/2012):   Ostial diagonal 30-40%, proximal mid ramus intermedius 40-50%, RCA stent patent with 40-50% after stent, then 40%, distal RCA 40-50%, EF 55-65%. Medical therapy continued.;  d.  [facility] Adam Myoview (06/2013):  No ischemia, EF 83%, normal study  . Hypertension   . BCC (basal cell carcinoma of skin)     dm2  . Hx of echocardiogram     a. Echo (08/2011): EF 33-83%, grade 1 diastolic dysfunction.   . Atrial fibrillation     s/p RFCA (pulmonary vein isolation) 08/2010 (Dr. Rayann Heman);  Event and Holter monitors (04/2012): No atrial fibrillation.  . Frequent UTI     on keflex for prophylaxis    Past Surgical History  Procedure Laterality Date  . Total abdominal hysterectomy    . Appendectomy    . Cholecystectomy    . Spinal cyst removed    . Coronary stent placement    . Atrial fibrillation ablation      by JA 2/12    Family History  Problem Relation Age of Onset  . Coronary artery disease Father   . Heart disease Sister   . Heart disease Mother     Social History:  reports that she quit smoking about 24 years ago. She does not have any smokeless tobacco history on file. She reports that she drinks alcohol. She reports that she does not use illicit drugs.  Allergies: No Known Allergies  Medications; Prior to Admission medications   Medication Sig Start Date End Date Taking?  Authorizing Provider  alosetron (LOTRONEX) 0.5 MG tablet Take 0.5 mg by mouth 2 (two) times daily.   Yes Historical Provider, MD  aspirin EC 81 MG tablet Take 1 tablet (81 mg total) by mouth daily. 05/06/12  Yes Hillary Bow, MD  cephALEXin (KEFLEX) 500 MG capsule Take 500 mg by mouth at bedtime.   Yes Historical Provider, MD  dabigatran (PRADAXA) 150 MG CAPS Take 1 capsule (150 mg total) by mouth every 12 (twelve) hours. 09/24/12  Yes Thompson Grayer, MD  lisinopril (PRINIVIL,ZESTRIL) 10 MG tablet TAKE 1 TABLET DAILY 01/05/13  Yes Thompson Grayer, MD  metoprolol succinate (TOPROL-XL) 25 MG 24 hr tablet Take 1 tablet (25 mg  total) by mouth 2 (two) times daily. 12 hours apart 05/27/13  Yes Scott Joylene Draft, PA-C  nitroGLYCERIN (NITROSTAT) 0.4 MG SL tablet Place 0.4 mg under the tongue every 5 (five) minutes as needed. For chest pain   Yes Historical Provider, MD  potassium chloride (KLOR-CON) 10 MEQ CR tablet Take 20 mEq by mouth every morning.    Yes Historical Provider, MD  rosuvastatin (CRESTOR) 40 MG tablet Take 40 mg by mouth daily.     Yes Historical Provider, MD  traZODone (DESYREL) 50 MG tablet Take 100 mg by mouth at bedtime.   Yes Historical Provider, MD   . atorvastatin  80 mg Oral q1800  . cephALEXin  500 mg Oral QHS  . docusate sodium  100 mg Oral BID  . insulin aspart  0-9 Units Subcutaneous Q4H  . metoprolol succinate  25 mg Oral BID  . pantoprazole (PROTONIX) IV  40 mg Intravenous QHS  . potassium chloride  10 mEq Oral Daily  . sodium chloride  3 mL Intravenous Q12H  . traZODone  100 mg Oral QHS   PRN Meds acetaminophen, acetaminophen, HYDROcodone-acetaminophen, ondansetron (ZOFRAN) IV, ondansetron Results for orders placed during the hospital encounter of 07/05/13 (from the past 48 hour(s))  OCCULT BLOOD, POC DEVICE     Status: Abnormal   Collection Time    07/05/13  9:42 PM      Result Value Range   Fecal Occult Bld POSITIVE (*) NEGATIVE  CBC WITH DIFFERENTIAL     Status: Abnormal   Collection Time    07/05/13  9:50 PM      Result Value Range   WBC 8.0  4.0 - 10.5 K/uL   RBC 3.64 (*) 3.87 - 5.11 MIL/uL   Hemoglobin 11.7 (*) 12.0 - 15.0 g/dL   HCT 35.1 (*) 36.0 - 46.0 %   MCV 96.4  78.0 - 100.0 fL   MCH 32.1  26.0 - 34.0 pg   MCHC 33.3  30.0 - 36.0 g/dL   RDW 13.2  11.5 - 15.5 %   Platelets 157  150 - 400 K/uL   Neutrophils Relative % 84 (*) 43 - 77 %   Neutro Abs 6.8  1.7 - 7.7 K/uL   Lymphocytes Relative 9 (*) 12 - 46 %   Lymphs Abs 0.7  0.7 - 4.0 K/uL   Monocytes Relative 6  3 - 12 %   Monocytes Absolute 0.5  0.1 - 1.0 K/uL   Eosinophils Relative 1  0 - 5 %   Eosinophils  Absolute 0.1  0.0 - 0.7 K/uL   Basophils Relative 0  0 - 1 %   Basophils Absolute 0.0  0.0 - 0.1 K/uL  COMPREHENSIVE METABOLIC PANEL     Status: Abnormal   Collection Time    07/05/13  9:50 PM      Result Value Range   Sodium 141  137 - 147 mEq/L   Comment: Please note change in reference range.   Potassium 3.8  3.7 - 5.3 mEq/L   Comment: Please note change in reference range.   Chloride 104  96 - 112 mEq/L   CO2 25  19 - 32 mEq/L   Glucose, Bld 151 (*) 70 - 99 mg/dL   BUN 21  6 - 23 mg/dL   Creatinine, Ser 1.40 (*) 0.50 - 1.10 mg/dL   Calcium 8.9  8.4 - 10.5 mg/dL   Total Protein 6.2  6.0 - 8.3 g/dL   Albumin 3.8  3.5 - 5.2 g/dL   AST 28  0 - 37 U/L   ALT 21  0 - 35 U/L   Alkaline Phosphatase 48  39 - 117 U/L   Total Bilirubin 0.2 (*) 0.3 - 1.2 mg/dL   GFR calc non Af Amer 37 (*) >90 mL/min   GFR calc Af Amer 43 (*) >90 mL/min   Comment: (NOTE)     The eGFR has been calculated using the CKD EPI equation.     This calculation has not been validated in all clinical situations.     eGFR's persistently <90 mL/min signify possible Chronic Kidney     Disease.  APTT     Status: Abnormal   Collection Time    07/05/13 11:08 PM      Result Value Range   aPTT 49 (*) 24 - 37 seconds   Comment:            IF BASELINE aPTT IS ELEVATED,     SUGGEST PATIENT RISK ASSESSMENT     BE USED TO DETERMINE APPROPRIATE     ANTICOAGULANT THERAPY.  PROTIME-INR     Status: Abnormal   Collection Time    07/05/13 11:08 PM      Result Value Range   Prothrombin Time 17.6 (*) 11.6 - 15.2 seconds   INR 1.49  0.00 - 1.49  TYPE AND SCREEN     Status: None   Collection Time    07/05/13 11:08 PM      Result Value Range   ABO/RH(D) A POS     Antibody Screen NEG     Sample Expiration 07/08/2013    ABO/RH     Status: None   Collection Time    07/05/13 11:08 PM      Result Value Range   ABO/RH(D) A POS    GLUCOSE, CAPILLARY     Status: None   Collection Time    07/06/13  1:54 AM      Result Value  Range   Glucose-Capillary 91  70 - 99 mg/dL   Comment 1 Documented in Chart     Comment 2 Notify RN    GLUCOSE, CAPILLARY     Status: None   Collection Time    07/06/13  4:40 AM      Result Value Range   Glucose-Capillary 88  70 - 99 mg/dL   Comment 1 Documented in Chart     Comment 2 Notify RN    MAGNESIUM     Status: None   Collection Time    07/06/13  5:16 AM      Result Value Range   Magnesium 2.0  1.5 - 2.5 mg/dL  PHOSPHORUS     Status: None   Collection Time    07/06/13  5:16 AM  Result Value Range   Phosphorus 3.2  2.3 - 4.6 mg/dL  TSH     Status: None   Collection Time    07/06/13  5:16 AM      Result Value Range   TSH 2.657  0.350 - 4.500 uIU/mL   Comment: Performed at [facility]     Status: Abnormal   Collection Time    07/06/13  5:16 AM      Result Value Range   Sodium 142  137 - 147 mEq/L   Comment: Please note change in reference range.   Potassium 3.7  3.7 - 5.3 mEq/L   Comment: Please note change in reference range.   Chloride 107  96 - 112 mEq/L   CO2 24  19 - 32 mEq/L   Glucose, Bld 98  70 - 99 mg/dL   BUN 19  6 - 23 mg/dL   Creatinine, Ser 1.37 (*) 0.50 - 1.10 mg/dL   Calcium 8.6  8.4 - 10.5 mg/dL   Total Protein 5.5 (*) 6.0 - 8.3 g/dL   Albumin 3.4 (*) 3.5 - 5.2 g/dL   AST 23  0 - 37 U/L   ALT 18  0 - 35 U/L   Alkaline Phosphatase 47  39 - 117 U/L   Total Bilirubin 0.3  0.3 - 1.2 mg/dL   GFR calc non Af Amer 38 (*) >90 mL/min   GFR calc Af Amer 44 (*) >90 mL/min   Comment: (NOTE)     The eGFR has been calculated using the CKD EPI equation.     This calculation has not been validated in all clinical situations.     eGFR's persistently <90 mL/min signify possible Chronic Kidney     Disease.  CBC     Status: Abnormal   Collection Time    07/06/13  5:16 AM      Result Value Range   WBC 6.4  4.0 - 10.5 K/uL   RBC 3.51 (*) 3.87 - 5.11 MIL/uL   Hemoglobin 10.9 (*) 12.0 - 15.0 g/dL   HCT 34.0 (*) 36.0  - 46.0 %   MCV 96.9  78.0 - 100.0 fL   MCH 31.1  26.0 - 34.0 pg   MCHC 32.1  30.0 - 36.0 g/dL   RDW 13.4  11.5 - 15.5 %   Platelets 130 (*) 150 - 400 K/uL  GLUCOSE, CAPILLARY     Status: None   Collection Time    07/06/13  7:59 AM      Result Value Range   Glucose-Capillary 91  70 - 99 mg/dL  GLUCOSE, CAPILLARY     Status: None   Collection Time    07/06/13 11:48 AM      Result Value Range   Glucose-Capillary 92  70 - 99 mg/dL    No results found.             Blood pressure 103/54, pulse 59, temperature 98.6 F (37 C), temperature source Oral, resp. rate 14, height _0  (1.575 m), weight 76.068 kg (167 lb 11.2 oz), SpO2 98.00%.  Physical exam:   General--pleasant white female in no acute distress Heart--regular rate and rhythm without murmurs are gallops Lungs-- clear Abdomen--soft and nontender somewhat hyperactive bowel sounds   Assessment: 1. Acute lower G.I. bleeding. This is certainly exacerbated by her Pradaxa. This could be due to a number of things but with colonoscopy otherwise normal 2013, suspect this is probably diverticular or due  to hemorrhoids. She has no abdominal pain to suggest ischemia. 2. Chronic diarrhea/weight loss. The patient has continued to have severe progressive diarrhea despite the use of Lotronex which is a quite potent antidiarrheal agent. Interestingly, her diarrhea has subsided considerably here in the hospital while she's been NPO suggesting a non-secretory etiology. This is also suggested by her weight loss of 20 pounds without any effort over the past several months and her history of nearly likely nocturnal stools. Certainly collagenous or microscopic colitis is possible but this is also quite suggestive of celiac disease.  Plan: 1. We will go ahead and order that he appropriate labs for celiac testing. We'll obtain G.I. pathogen panel to look for infectious causes as well as a lactoferrin. We will plan on going ahead tomorrow with  unprepped sigmoidoscopy biopsies while she is off of anticoagulants. I've discussed all this with the patient and she is agreeable.   Robin Pafford JR,Keoki Mchargue L 07/06/2013, 2:40 PM

## 2013-07-06 NOTE — Progress Notes (Signed)
Patient seen and admitted earlier this AM by my associate. Please refer to her H and P for details regarding assessment and plan.  I have consulted [facility] for evaluation and disposition recommendations in lieu of presenting complaint.    We'll reassess next a.m.  Velvet Bathe   The patient sees a GI physician in [place], a Dr. Tamala Julian reportedly

## 2013-07-06 NOTE — Progress Notes (Signed)
INITIAL NUTRITION ASSESSMENT  DOCUMENTATION CODES Per approved criteria  -Obesity Unspecified   INTERVENTION: Recommend Ensure Complete po BID, each supplement provides 350 kcal and 13 grams of protein with diet advancement Recommend low fiber diet as tolerated   NUTRITION DIAGNOSIS: Unintentional wt loss related to loose stools/IBS as evidenced by 22 lbs wt loss.   Goal: Pt to meet >/= 90% of their estimated nutrition needs    Monitor:  Diet advancement, total protein/energy intake, GI profile, labs, weights  Reason for Assessment: Malnutrition Screening Tool  68 y.o. female  Admitting Dx: <principal problem not specified>  ASSESSMENT: [name] is a 68 y.o. female has a past medical history of Diabetes mellitus; Hyperlipidemia; Coronary artery disease; Hypertension; BCC (basal cell carcinoma of skin); echocardiogram; Atrial fibrillation; and Frequent UTI.  Presented with At 6 :30 pm patient went to have a BM and noticed blood in the bowl. When she stood up bright red blood was running down her legs. This has since resolved. Reports episodes of sharp abdominal pain earlier on in the day. Last colonoscopy was in Nov 2013 and was WNL.  -Pt reported unintentional wt loss of 22 lbs in 4-5 months r/t IBS and chronic diarrhea. Is unable to tolerate any solids w/out subsequent loose stools.  -Denied any changes in appetite. Drinks Ensure 1-2 daily -Primary doctor advised pt to comply with "low FODMAP diet" which limits intake of carbohydrates. Pt has had mixed results with diet. -Pt may benefit from a low fiber diet as tolerated -Continues with bloody BM per RN, no current plans for diet advancement  Height: Ht Readings from Last 1 Encounters:  07/06/13 5\' 2"  (1.575 m)    Weight: Wt Readings from Last 1 Encounters:  07/06/13 167 lb 11.2 oz (76.068 kg)    Ideal Body Weight: 110 lbs  % Ideal Body Weight: 152%  Wt Readings from Last 10 Encounters:  07/06/13 167 lb 11.2  oz (76.068 kg)  06/15/13 166 lb (75.297 kg)  05/27/13 168 lb 6.4 oz (76.386 kg)  02/09/13 173 lb (78.472 kg)  08/28/12 175 lb 9.6 oz (79.652 kg)  05/09/12 174 lb (78.926 kg)  05/09/12 174 lb (78.926 kg)  05/06/12 174 lb (78.926 kg)  05/06/12 174 lb (78.926 kg)  04/25/12 174 lb (78.926 kg)    Usual Body Weight: 190 lbs  % Usual Body Weight: 88%  BMI:  Body mass index is 30.66 kg/(m^2). Obesity I  Estimated Nutritional Needs: Kcal: 1400-1600  Protein: 70-80 gram Fluid: 2200 ml/daily  Skin: WDL  Diet Order: NPO  EDUCATION NEEDS: -No education needs identified at this time   Intake/Output Summary (Last 24 hours) at 07/06/13 1236 Last data filed at 07/06/13 1100  Gross per 24 hour  Intake 481.67 ml  Output    875 ml  Net -393.33 ml    Last BM: 1/04  Labs:   Recent Labs Lab 07/05/13 2150 07/06/13 0516  NA 141 142  K 3.8 3.7  CL 104 107  CO2 25 24  BUN 21 19  CREATININE 1.40* 1.37*  CALCIUM 8.9 8.6  MG  --  2.0  PHOS  --  3.2  GLUCOSE 151* 98    CBG (last 3)   Recent Labs  07/06/13 0440 07/06/13 0759 07/06/13 1148  GLUCAP 88 91 92    Scheduled Meds: . atorvastatin  80 mg Oral q1800  . cephALEXin  500 mg Oral QHS  . docusate sodium  100 mg Oral BID  . insulin aspart  0-9 Units Subcutaneous Q4H  . metoprolol succinate  25 mg Oral BID  . pantoprazole (PROTONIX) IV  40 mg Intravenous QHS  . potassium chloride  10 mEq Oral Daily  . sodium chloride  3 mL Intravenous Q12H  . traZODone  100 mg Oral QHS    Continuous Infusions: . sodium chloride 1,000 mL (07/06/13 0211)    Past Medical History  Diagnosis Date  . Diabetes mellitus   . Hyperlipidemia   . Coronary artery disease     a.  s/p Xience DES to RCA 04/2009;   b. TEE 2/12: EF 40%, Large PFO;  c.  Lexiscan Myoview 05/2012: EF 69%, no ischemia. LHC (05/2012):  Ostial diagonal 30-40%, proximal mid ramus intermedius 40-50%, RCA stent patent with 40-50% after stent, then 40%, distal RCA  40-50%, EF 55-65%. Medical therapy continued.;  d.  [facility] Adam Myoview (06/2013):  No ischemia, EF 83%, normal study  . Hypertension   . BCC (basal cell carcinoma of skin)     dm2  . Hx of echocardiogram     a. Echo (08/2011): EF 62-37%, grade 1 diastolic dysfunction.   . Atrial fibrillation     s/p RFCA (pulmonary vein isolation) 08/2010 (Dr. Rayann Heman);  Event and Holter monitors (04/2012): No atrial fibrillation.  . Frequent UTI     on keflex for prophylaxis    Past Surgical History  Procedure Laterality Date  . Total abdominal hysterectomy    . Appendectomy    . Cholecystectomy    . Spinal cyst removed    . Coronary stent placement    . Atrial fibrillation ablation      by JA 2/12    Atlee Abide Yamhill RD LDN Clinical Dietitian [phone]

## 2013-07-06 NOTE — Care Management Note (Signed)
    Page 1 of 1   07/06/2013     2:41:52 PM   CARE MANAGEMENT NOTE 07/06/2013  Patient:  [name], [name]   Account Number:  [id number]  Date Initiated:  07/06/2013  Documentation initiated by:  Dessa Phi  Subjective/Objective Assessment:   68 Y/O F ADMITTED W/AFIB,GIB.HX:CAD-NATIVE VESSEL DZ.     Action/Plan:   FROM HOME.   Anticipated DC Date:  07/09/2013   Anticipated DC Plan:  [place]  CM consult      Choice offered to / List presented to:             Status of service:  In process, will continue to follow Medicare Important Message given?   (If response is "NO", the following Medicare IM given date fields will be blank) Date Medicare IM given:   Date Additional Medicare IM given:    Discharge Disposition:    Per UR Regulation:  Reviewed for med. necessity/level of care/duration of stay  If discussed at [place] of Stay Meetings, dates discussed:    Comments:  07/06/13 Siena Poehler RN,BSN NCM [phone] GI CONS.NO ANTICIPATED D/C NEEDS.

## 2013-07-07 ENCOUNTER — Encounter (HOSPITAL_COMMUNITY): Payer: Self-pay | Admitting: *Deleted

## 2013-07-07 ENCOUNTER — Encounter (HOSPITAL_COMMUNITY)
Admission: EM | Disposition: A | Discharge status: Home / Self Care | Payer: Self-pay | Source: Home / Self Care | Attending: Family Medicine

## 2013-07-07 DIAGNOSIS — I200 Unstable angina: Secondary | ICD-10-CM

## 2013-07-07 HISTORY — PX: FLEXIBLE SIGMOIDOSCOPY: SHX5431

## 2013-07-07 LAB — GI PATHOGEN PANEL BY PCR, STOOL
C DIFFICILE TOXIN A/B: NEGATIVE
Campylobacter by PCR: NEGATIVE
Cryptosporidium by PCR: NEGATIVE
E COLI (STEC): NEGATIVE
E COLI 0157 BY PCR: NEGATIVE
E coli (ETEC) LT/ST: NEGATIVE
G LAMBLIA BY PCR: NEGATIVE
Norovirus GI/GII: NEGATIVE
ROTAVIRUS A BY PCR: NEGATIVE
SALMONELLA BY PCR: NEGATIVE
Shigella by PCR: NEGATIVE

## 2013-07-07 LAB — FECAL LACTOFERRIN, QUANT: FECAL LACTOFERRIN: NEGATIVE

## 2013-07-07 LAB — TISSUE TRANSGLUTAMINASE, IGA: Tissue Transglutaminase Ab, IgA: 6.4 U/mL (ref <20)

## 2013-07-07 LAB — GLUCOSE, CAPILLARY
GLUCOSE-CAPILLARY: 106 mg/dL — AB (ref 70–99)
Glucose-Capillary: 92 mg/dL (ref 70–99)

## 2013-07-07 LAB — GLIADIN ANTIBODIES, SERUM
Gliadin IgA: 7.7 U/mL (ref <20)
Gliadin IgG: 5.3 U/mL (ref <20)

## 2013-07-07 LAB — RETICULIN ANTIBODIES, IGA W TITER: Reticulin Ab, IgA: NEGATIVE

## 2013-07-07 SURGERY — SIGMOIDOSCOPY, FLEXIBLE
Anesthesia: Moderate Sedation

## 2013-07-07 MED ORDER — MIDAZOLAM HCL 10 MG/2ML IJ SOLN
INTRAMUSCULAR | Status: DC | PRN
  Administered 2013-07-07: 1 mg via INTRAVENOUS
  Administered 2013-07-07 (×2): 2 mg via INTRAVENOUS

## 2013-07-07 MED ORDER — MIDAZOLAM HCL 10 MG/2ML IJ SOLN
INTRAMUSCULAR | Status: AC
  Filled 2013-07-07: qty 2

## 2013-07-07 MED ORDER — FENTANYL CITRATE 0.05 MG/ML IJ SOLN
INTRAMUSCULAR | Status: DC | PRN
  Administered 2013-07-07 (×2): 25 ug via INTRAVENOUS

## 2013-07-07 MED ORDER — FENTANYL CITRATE 0.05 MG/ML IJ SOLN
INTRAMUSCULAR | Status: AC
  Filled 2013-07-07: qty 2

## 2013-07-07 MED ORDER — SODIUM CHLORIDE 0.9 % IV SOLN
INTRAVENOUS | Status: DC
  Administered 2013-07-07: 500 mL via INTRAVENOUS

## 2013-07-07 NOTE — H&P (View-Only) (Signed)
[facility] CONSULT Reason for consult: chronic diarrhea and weight loss Referring Physician: [facility]. PCP: Dr. Roswell Miners [facility]  [name] is an 68 y.o. female.  HPI:  she has a history of chronic diarrhea that she has had now for several years.  8 years ago she had 3 bowel movement daily or lose but no real nocturnal stools. This began sometime after her cholecystectomy and have been attributed to her cholecystectomy. Over the past 2 to 3 years, her bowel movements and got progressively worse and over the past 6 months she has had approximately 15 loose watery bowel movements daily with nocturnal stools nearly every night. She has seen a gastroenterologist in [place] Dr. Tamala Julian, who has treated her with anti-spasmodic medications most recently Lotronex. Despite that she has continued to have diarrhea. In 2013 she had colonoscopy that she was told was normal. She is due to see Dr. Tamala Julian back in the next several weeks to perform colonic biopsies. Over the past 6 to 9 months she has lost approximately 20 pounds without really making the effort to do that. The patient is on Pradaxa because of a history of intermittent atrial fibrillation. She was hospitalized after she had what she thought was another episode of fecal incontinence due to her diarrhea that proved to be passage of blood along with loose watery stool. She reports that she had brought red blood running down her legs and felt weak and dizzy. She was placed in a hospital on IV fluids and her prep accent has been on hold. She has been in PO and has noticed that her diarrhea is slowed down considerably. Her hemoglobin has been relatively stable.  Past Medical History  Diagnosis Date  . Diabetes mellitus   . Hyperlipidemia   . Coronary artery disease     a.  s/p Xience DES to RCA 04/2009;   b. TEE 2/12: EF 40%, Large PFO;  c.  Lexiscan Myoview 05/2012: EF 69%, no ischemia. LHC (05/2012):   Ostial diagonal 30-40%, proximal mid ramus intermedius 40-50%, RCA stent patent with 40-50% after stent, then 40%, distal RCA 40-50%, EF 55-65%. Medical therapy continued.;  d.  Carlton Adam Myoview (06/2013):  No ischemia, EF 83%, normal study  . Hypertension   . BCC (basal cell carcinoma of skin)     dm2  . Hx of echocardiogram     a. Echo (08/2011): EF 33-83%, grade 1 diastolic dysfunction.   . Atrial fibrillation     s/p RFCA (pulmonary vein isolation) 08/2010 (Dr. Rayann Heman);  Event and Holter monitors (04/2012): No atrial fibrillation.  . Frequent UTI     on keflex for prophylaxis    Past Surgical History  Procedure Laterality Date  . Total abdominal hysterectomy    . Appendectomy    . Cholecystectomy    . Spinal cyst removed    . Coronary stent placement    . Atrial fibrillation ablation      by JA 2/12    Family History  Problem Relation Age of Onset  . Coronary artery disease Father   . Heart disease Sister   . Heart disease Mother     Social History:  reports that she quit smoking about 24 years ago. She does not have any smokeless tobacco history on file. She reports that she drinks alcohol. She reports that she does not use illicit drugs.  Allergies: No Known Allergies  Medications; Prior to Admission medications   Medication Sig Start Date End Date Taking?  Authorizing Provider  alosetron (LOTRONEX) 0.5 MG tablet Take 0.5 mg by mouth 2 (two) times daily.   Yes Historical Provider, MD  aspirin EC 81 MG tablet Take 1 tablet (81 mg total) by mouth daily. 05/06/12  Yes Hillary Bow, MD  cephALEXin (KEFLEX) 500 MG capsule Take 500 mg by mouth at bedtime.   Yes Historical Provider, MD  dabigatran (PRADAXA) 150 MG CAPS Take 1 capsule (150 mg total) by mouth every 12 (twelve) hours. 09/24/12  Yes Thompson Grayer, MD  lisinopril (PRINIVIL,ZESTRIL) 10 MG tablet TAKE 1 TABLET DAILY 01/05/13  Yes Thompson Grayer, MD  metoprolol succinate (TOPROL-XL) 25 MG 24 hr tablet Take 1 tablet (25 mg  total) by mouth 2 (two) times daily. 12 hours apart 05/27/13  Yes Scott Joylene Draft, PA-C  nitroGLYCERIN (NITROSTAT) 0.4 MG SL tablet Place 0.4 mg under the tongue every 5 (five) minutes as needed. For chest pain   Yes Historical Provider, MD  potassium chloride (KLOR-CON) 10 MEQ CR tablet Take 20 mEq by mouth every morning.    Yes Historical Provider, MD  rosuvastatin (CRESTOR) 40 MG tablet Take 40 mg by mouth daily.     Yes Historical Provider, MD  traZODone (DESYREL) 50 MG tablet Take 100 mg by mouth at bedtime.   Yes Historical Provider, MD   . atorvastatin  80 mg Oral q1800  . cephALEXin  500 mg Oral QHS  . docusate sodium  100 mg Oral BID  . insulin aspart  0-9 Units Subcutaneous Q4H  . metoprolol succinate  25 mg Oral BID  . pantoprazole (PROTONIX) IV  40 mg Intravenous QHS  . potassium chloride  10 mEq Oral Daily  . sodium chloride  3 mL Intravenous Q12H  . traZODone  100 mg Oral QHS   PRN Meds acetaminophen, acetaminophen, HYDROcodone-acetaminophen, ondansetron (ZOFRAN) IV, ondansetron Results for orders placed during the hospital encounter of 07/05/13 (from the past 48 hour(s))  OCCULT BLOOD, POC DEVICE     Status: Abnormal   Collection Time    07/05/13  9:42 PM      Result Value Range   Fecal Occult Bld POSITIVE (*) NEGATIVE  CBC WITH DIFFERENTIAL     Status: Abnormal   Collection Time    07/05/13  9:50 PM      Result Value Range   WBC 8.0  4.0 - 10.5 K/uL   RBC 3.64 (*) 3.87 - 5.11 MIL/uL   Hemoglobin 11.7 (*) 12.0 - 15.0 g/dL   HCT 35.1 (*) 36.0 - 46.0 %   MCV 96.4  78.0 - 100.0 fL   MCH 32.1  26.0 - 34.0 pg   MCHC 33.3  30.0 - 36.0 g/dL   RDW 13.2  11.5 - 15.5 %   Platelets 157  150 - 400 K/uL   Neutrophils Relative % 84 (*) 43 - 77 %   Neutro Abs 6.8  1.7 - 7.7 K/uL   Lymphocytes Relative 9 (*) 12 - 46 %   Lymphs Abs 0.7  0.7 - 4.0 K/uL   Monocytes Relative 6  3 - 12 %   Monocytes Absolute 0.5  0.1 - 1.0 K/uL   Eosinophils Relative 1  0 - 5 %   Eosinophils  Absolute 0.1  0.0 - 0.7 K/uL   Basophils Relative 0  0 - 1 %   Basophils Absolute 0.0  0.0 - 0.1 K/uL  COMPREHENSIVE METABOLIC PANEL     Status: Abnormal   Collection Time    07/05/13  9:50 PM      Result Value Range   Sodium 141  137 - 147 mEq/Krause   Comment: Please note change in reference range.   Potassium 3.8  3.7 - 5.3 mEq/Krause   Comment: Please note change in reference range.   Chloride 104  96 - 112 mEq/Krause   CO2 25  19 - 32 mEq/Krause   Glucose, Bld 151 (*) 70 - 99 mg/dL   BUN 21  6 - 23 mg/dL   Creatinine, Ser 1.40 (*) 0.50 - 1.10 mg/dL   Calcium 8.9  8.4 - 10.5 mg/dL   Total Protein 6.2  6.0 - 8.3 g/dL   Albumin 3.8  3.5 - 5.2 g/dL   AST 28  0 - 37 U/Krause   ALT 21  0 - 35 U/Krause   Alkaline Phosphatase 48  39 - 117 U/Krause   Total Bilirubin 0.2 (*) 0.3 - 1.2 mg/dL   GFR calc non Af Amer 37 (*) >90 mL/min   GFR calc Af Amer 43 (*) >90 mL/min   Comment: (NOTE)     The eGFR has been calculated using the CKD EPI equation.     This calculation has not been validated in all clinical situations.     eGFR's persistently <90 mL/min signify possible Chronic Kidney     Disease.  APTT     Status: Abnormal   Collection Time    07/05/13 11:08 PM      Result Value Range   aPTT 49 (*) 24 - 37 seconds   Comment:            IF BASELINE aPTT IS ELEVATED,     SUGGEST PATIENT RISK ASSESSMENT     BE USED TO DETERMINE APPROPRIATE     ANTICOAGULANT THERAPY.  PROTIME-INR     Status: Abnormal   Collection Time    07/05/13 11:08 PM      Result Value Range   Prothrombin Time 17.6 (*) 11.6 - 15.2 seconds   INR 1.49  0.00 - 1.49  TYPE AND SCREEN     Status: None   Collection Time    07/05/13 11:08 PM      Result Value Range   ABO/RH(D) A POS     Antibody Screen NEG     Sample Expiration 07/08/2013    ABO/RH     Status: None   Collection Time    07/05/13 11:08 PM      Result Value Range   ABO/RH(D) A POS    GLUCOSE, CAPILLARY     Status: None   Collection Time    07/06/13  1:54 AM      Result Value  Range   Glucose-Capillary 91  70 - 99 mg/dL   Comment 1 Documented in Chart     Comment 2 Notify RN    GLUCOSE, CAPILLARY     Status: None   Collection Time    07/06/13  4:40 AM      Result Value Range   Glucose-Capillary 88  70 - 99 mg/dL   Comment 1 Documented in Chart     Comment 2 Notify RN    MAGNESIUM     Status: None   Collection Time    07/06/13  5:16 AM      Result Value Range   Magnesium 2.0  1.5 - 2.5 mg/dL  PHOSPHORUS     Status: None   Collection Time    07/06/13  5:16 AM  Result Value Range   Phosphorus 3.2  2.3 - 4.6 mg/dL  TSH     Status: None   Collection Time    07/06/13  5:16 AM      Result Value Range   TSH 2.657  0.350 - 4.500 uIU/mL   Comment: Performed at [facility]     Status: Abnormal   Collection Time    07/06/13  5:16 AM      Result Value Range   Sodium 142  137 - 147 mEq/Krause   Comment: Please note change in reference range.   Potassium 3.7  3.7 - 5.3 mEq/Krause   Comment: Please note change in reference range.   Chloride 107  96 - 112 mEq/Krause   CO2 24  19 - 32 mEq/Krause   Glucose, Bld 98  70 - 99 mg/dL   BUN 19  6 - 23 mg/dL   Creatinine, Ser 1.37 (*) 0.50 - 1.10 mg/dL   Calcium 8.6  8.4 - 10.5 mg/dL   Total Protein 5.5 (*) 6.0 - 8.3 g/dL   Albumin 3.4 (*) 3.5 - 5.2 g/dL   AST 23  0 - 37 U/Krause   ALT 18  0 - 35 U/Krause   Alkaline Phosphatase 47  39 - 117 U/Krause   Total Bilirubin 0.3  0.3 - 1.2 mg/dL   GFR calc non Af Amer 38 (*) >90 mL/min   GFR calc Af Amer 44 (*) >90 mL/min   Comment: (NOTE)     The eGFR has been calculated using the CKD EPI equation.     This calculation has not been validated in all clinical situations.     eGFR's persistently <90 mL/min signify possible Chronic Kidney     Disease.  CBC     Status: Abnormal   Collection Time    07/06/13  5:16 AM      Result Value Range   WBC 6.4  4.0 - 10.5 K/uL   RBC 3.51 (*) 3.87 - 5.11 MIL/uL   Hemoglobin 10.9 (*) 12.0 - 15.0 g/dL   HCT 34.0 (*) 36.0  - 46.0 %   MCV 96.9  78.0 - 100.0 fL   MCH 31.1  26.0 - 34.0 pg   MCHC 32.1  30.0 - 36.0 g/dL   RDW 13.4  11.5 - 15.5 %   Platelets 130 (*) 150 - 400 K/uL  GLUCOSE, CAPILLARY     Status: None   Collection Time    07/06/13  7:59 AM      Result Value Range   Glucose-Capillary 91  70 - 99 mg/dL  GLUCOSE, CAPILLARY     Status: None   Collection Time    07/06/13 11:48 AM      Result Value Range   Glucose-Capillary 92  70 - 99 mg/dL    No results found.             Blood pressure 103/54, pulse 59, temperature 98.6 F (37 C), temperature source Oral, resp. rate 14, height _0  (1.575 m), weight 76.068 kg (167 lb 11.2 oz), SpO2 98.00%.  Physical exam:   General--pleasant white female in no acute distress Heart--regular rate and rhythm without murmurs are gallops Lungs-- clear Abdomen--soft and nontender somewhat hyperactive bowel sounds   Assessment: 1. Acute lower G.I. bleeding. This is certainly exacerbated by her Pradaxa. This could be due to a number of things but with colonoscopy otherwise normal 2013, suspect this is probably diverticular or due  to hemorrhoids. She has no abdominal pain to suggest ischemia. 2. Chronic diarrhea/weight loss. The patient has continued to have severe progressive diarrhea despite the use of Lotronex which is a quite potent antidiarrheal agent. Interestingly, her diarrhea has subsided considerably here in the hospital while she's been NPO suggesting a non-secretory etiology. This is also suggested by her weight loss of 20 pounds without any effort over the past several months and her history of nearly likely nocturnal stools. Certainly collagenous or microscopic colitis is possible but this is also quite suggestive of celiac disease.  Plan: 1. We will go ahead and order that he appropriate labs for celiac testing. We'll obtain G.I. pathogen panel to look for infectious causes as well as a lactoferrin. We will plan on going ahead tomorrow with  unprepped sigmoidoscopy biopsies while she is off of anticoagulants. I've discussed all this with the patient and she is agreeable.   [name],[name] 07/06/2013, 2:40 PM

## 2013-07-07 NOTE — Op Note (Signed)
[facility] [place], [id number]   FLEXIBLE SIGMOIDOSCOPY PROCEDURE REPORT  PATIENT: Renee, Krause  MR#: [id number] BIRTHDATE: 1946-02-19 , 82  yrs. old GENDER: Female ENDOSCOPIST: Laurence Spates, MD REFERRED BY: [facility]. PCP: Dr. Roswell Miners PROCEDURE DATE:  07/07/2013 PROCEDURE:    sigmoidoscopy and biopsy ASA CLASS:    class III INDICATIONS: chronic diarrhea and 20 pound weight loss MEDICATIONS:   fentanyl 50 mcg, versed 5 mg IV  DESCRIPTION OF PROCEDURE:   The Pentax pediatric scope was inserted: digital rectal exam due to the patient's history chronic diarrhea the procedure was done unprepped. There was a small amount of sticky stool in the colon. We advanced up to the sigmoid colon 55 cm from the anus. A few diverticula were seen. There was no active bleeding. Mucosal was completely normal throughout. Multiple random biopsies were obtained to evaluate for microscopic colitis. No gross abnormalities in the retroflex view. Patient tolerated procedure well.        COMPLICATIONS:  None  ENDOSCOPIC IMPRESSION: 1. Chronic diarrhea. Grossly normal mucosal in sigmoidoscopy. Biopsies for microscopic colitis.  RECOMMENDATIONS: 1. will resume diet and await the results of the colonic biopsies.   _______________________________ Lorrin MaisLaurence Spates, MD 07/07/2013 9:24 AM CC: Dr. Roswell Miners, [facility]

## 2013-07-07 NOTE — Discharge Summary (Signed)
Physician Discharge Summary  [name] ZOX:[id number] DOB: 01-11-1946 DOA: 07/05/2013  PCP: [name], MD  Admit date: 07/05/2013 Discharge date: 07/07/2013  Time spent: > 35 minutes  Recommendations for Outpatient Follow-up:  1. Please be sure to follow up with your primary care physician 2. Please reassess CBC within the next week 3. Medications: Aspirin and Pradaxa held on discharge 2ary to reported blood per rectum  Discharge Diagnoses:  Active Problems:   DIABETES MELLITUS, TYPE II   Essential hypertension, benign   CAD, NATIVE VESSEL   Atrial fibrillation   Bright red blood per rectum   Discharge Condition: Stable  Diet recommendation: Low sodium heart healthy/ diabetic diet  Filed Weights   07/05/13 2102 07/06/13 0155  Weight: 72.576 kg (160 lb) 76.068 kg (167 lb 11.2 oz)    History of present illness:  Patient is a 68 year old female with past medical history diabetes mellitus, hyperlipidemia, coronary artery disease, hypertension, and atrial fibrillation on aspirin and Pradaxa.  She presented to the emergency department secondary to by red blood per rectum.  Hospital Course:  Bright red blood per rectum - Evaluated by gastroenterology who performed flexible sigmoidoscopy and obtained biopsies for microscopic colitis. Plan is to await colonic biopsies and reportedly patient had grossly normal mucosa - Will discharge off of pradaxa and aspirin, patient is aware that this increases her risks of stroke but understands that this medication can continue to make her anemia worse and as such would be more risky to continue at this point. - Recommend patient followup with her primary care physician for further recommendations  Atrial fibrillation - Please see above, we'll plan on continuing rate controlling agent (beta blocker)  CAD - Patient to continue statin and heart healthy diet  Diabetes Stable patient to continue diabetic diet and followup with primary care  physician for further recommendations  Procedures:  As indicated above  Consultations:  Gastroenterologist: Dr. Oletta Lamas  Discharge Exam: Filed Vitals:   07/07/13 1100  BP: 128/68  Pulse: 64  Temp:   Resp:     General: Pt in NAD, Alert and awake Cardiovascular: normal s1 and s2 WNL, no murmurs Respiratory: CTA BL, no wheezes  Discharge Instructions  Discharge Orders   Future Orders Complete By Expires   Call MD for:  severe uncontrolled pain  As directed    Call MD for:  temperature >100.4  As directed    Diet - low sodium heart healthy  As directed    Discharge instructions  As directed    Comments:     Please be sure to follow up with your primary care physician for further evaluation and recommendations regarding your anticoagulation medication.   Increase activity slowly  As directed        Medication List    STOP taking these medications       aspirin EC 81 MG tablet     dabigatran 150 MG Caps capsule  Commonly known as:  PRADAXA     lisinopril 10 MG tablet  Commonly known as:  PRINIVIL,ZESTRIL      TAKE these medications       alosetron 0.5 MG tablet  Commonly known as:  LOTRONEX  Take 0.5 mg by mouth 2 (two) times daily.     cephALEXin 500 MG capsule  Commonly known as:  KEFLEX  Take 500 mg by mouth at bedtime.     metoprolol succinate 25 MG 24 hr tablet  Commonly known as:  TOPROL-XL  Take 1 tablet (25  mg total) by mouth 2 (two) times daily. 12 hours apart     nitroGLYCERIN 0.4 MG SL tablet  Commonly known as:  NITROSTAT  Place 0.4 mg under the tongue every 5 (five) minutes as needed. For chest pain     potassium chloride 10 MEQ CR tablet  Commonly known as:  KLOR-CON  Take 20 mEq by mouth every morning.     rosuvastatin 40 MG tablet  Commonly known as:  CRESTOR  Take 40 mg by mouth daily.     traZODone 50 MG tablet  Commonly known as:  DESYREL  Take 100 mg by mouth at bedtime.       No Known Allergies    The results of  significant diagnostics from this hospitalization (including imaging, microbiology, ancillary and laboratory) are listed below for reference.    Significant Diagnostic Studies: No results found.  Microbiology: No results found for this or any previous visit (from the past 240 hour(s)).   Labs: Basic Metabolic Panel:  Recent Labs Lab 07/05/13 2150 07/06/13 0516  NA 141 142  K 3.8 3.7  CL 104 107  CO2 25 24  GLUCOSE 151* 98  BUN 21 19  CREATININE 1.40* 1.37*  CALCIUM 8.9 8.6  MG  --  2.0  PHOS  --  3.2   Liver Function Tests:  Recent Labs Lab 07/05/13 2150 07/06/13 0516  AST 28 23  ALT 21 18  ALKPHOS 48 47  BILITOT 0.2* 0.3  PROT 6.2 5.5*  ALBUMIN 3.8 3.4*   No results found for this basename: LIPASE, AMYLASE,  in the last 168 hours No results found for this basename: AMMONIA,  in the last 168 hours CBC:  Recent Labs Lab 07/05/13 2150 07/06/13 0516  WBC 8.0 6.4  NEUTROABS 6.8  --   HGB 11.7* 10.9*  HCT 35.1* 34.0*  MCV 96.4 96.9  PLT 157 130*   Cardiac Enzymes: No results found for this basename: CKTOTAL, CKMB, CKMBINDEX, TROPONINI,  in the last 168 hours BNP: BNP (last 3 results)  Recent Labs  05/27/13 0952  PROBNP 39.0   CBG:  Recent Labs Lab 07/06/13 1148 07/06/13 1645 07/06/13 2013 07/07/13 0017 07/07/13 0423  GLUCAP 92 135* 147* 92 106*       Signed:  Velvet Bathe  [facility] 07/07/2013, 2:30 PM

## 2013-07-07 NOTE — Interval H&P Note (Signed)
History and Physical Interval Note:  07/07/2013 9:00 AM  [name]  has presented today for surgery, with the diagnosis of diarrhea  The various methods of treatment have been discussed with the patient and family. After consideration of risks, benefits and other options for treatment, the patient has consented to  Procedure(s) with comments: FLEXIBLE SIGMOIDOSCOPY (N/A) - unprepped as a surgical intervention .  The patient's history has been reviewed, patient examined, no change in status, stable for surgery.  I have reviewed the patient's chart and labs.  Questions were answered to the patient's satisfaction.     Parminder Trapani JR,[name]

## 2013-07-08 ENCOUNTER — Encounter (HOSPITAL_COMMUNITY): Payer: Self-pay | Admitting: Gastroenterology

## 2013-07-08 ENCOUNTER — Ambulatory Visit: Payer: Medicare Other | Admitting: Internal Medicine

## 2013-07-15 ENCOUNTER — Emergency Department (HOSPITAL_COMMUNITY)
Admission: EM | Admit: 2013-07-15 | Discharge: 2013-07-15 | Disposition: A | Discharge status: Home / Self Care | Payer: Medicare Other | Attending: Emergency Medicine | Admitting: Emergency Medicine

## 2013-07-15 ENCOUNTER — Other Ambulatory Visit: Payer: Self-pay

## 2013-07-15 ENCOUNTER — Encounter (HOSPITAL_COMMUNITY): Payer: Self-pay | Admitting: Emergency Medicine

## 2013-07-15 ENCOUNTER — Emergency Department (HOSPITAL_COMMUNITY): Payer: Medicare Other

## 2013-07-15 DIAGNOSIS — Z87891 Personal history of nicotine dependence: Secondary | ICD-10-CM | POA: Insufficient documentation

## 2013-07-15 DIAGNOSIS — K589 Irritable bowel syndrome without diarrhea: Secondary | ICD-10-CM | POA: Insufficient documentation

## 2013-07-15 DIAGNOSIS — I2510 Atherosclerotic heart disease of native coronary artery without angina pectoris: Secondary | ICD-10-CM | POA: Insufficient documentation

## 2013-07-15 DIAGNOSIS — R142 Eructation: Secondary | ICD-10-CM | POA: Insufficient documentation

## 2013-07-15 DIAGNOSIS — Z792 Long term (current) use of antibiotics: Secondary | ICD-10-CM | POA: Insufficient documentation

## 2013-07-15 DIAGNOSIS — Z9861 Coronary angioplasty status: Secondary | ICD-10-CM | POA: Insufficient documentation

## 2013-07-15 DIAGNOSIS — Z79899 Other long term (current) drug therapy: Secondary | ICD-10-CM | POA: Insufficient documentation

## 2013-07-15 DIAGNOSIS — R141 Gas pain: Secondary | ICD-10-CM | POA: Insufficient documentation

## 2013-07-15 DIAGNOSIS — Z7901 Long term (current) use of anticoagulants: Secondary | ICD-10-CM | POA: Insufficient documentation

## 2013-07-15 DIAGNOSIS — I4891 Unspecified atrial fibrillation: Secondary | ICD-10-CM | POA: Insufficient documentation

## 2013-07-15 DIAGNOSIS — E785 Hyperlipidemia, unspecified: Secondary | ICD-10-CM | POA: Insufficient documentation

## 2013-07-15 DIAGNOSIS — I10 Essential (primary) hypertension: Secondary | ICD-10-CM | POA: Insufficient documentation

## 2013-07-15 DIAGNOSIS — E119 Type 2 diabetes mellitus without complications: Secondary | ICD-10-CM | POA: Insufficient documentation

## 2013-07-15 DIAGNOSIS — R079 Chest pain, unspecified: Secondary | ICD-10-CM | POA: Insufficient documentation

## 2013-07-15 DIAGNOSIS — R109 Unspecified abdominal pain: Secondary | ICD-10-CM

## 2013-07-15 DIAGNOSIS — R197 Diarrhea, unspecified: Secondary | ICD-10-CM | POA: Insufficient documentation

## 2013-07-15 DIAGNOSIS — I509 Heart failure, unspecified: Secondary | ICD-10-CM | POA: Diagnosis present

## 2013-07-15 DIAGNOSIS — R1013 Epigastric pain: Secondary | ICD-10-CM | POA: Insufficient documentation

## 2013-07-15 DIAGNOSIS — R112 Nausea with vomiting, unspecified: Secondary | ICD-10-CM | POA: Insufficient documentation

## 2013-07-15 DIAGNOSIS — R143 Flatulence: Secondary | ICD-10-CM

## 2013-07-15 DIAGNOSIS — Z85820 Personal history of malignant melanoma of skin: Secondary | ICD-10-CM | POA: Insufficient documentation

## 2013-07-15 DIAGNOSIS — Z87440 Personal history of urinary (tract) infections: Secondary | ICD-10-CM | POA: Insufficient documentation

## 2013-07-15 DIAGNOSIS — R1110 Vomiting, unspecified: Secondary | ICD-10-CM

## 2013-07-15 LAB — URINALYSIS, DIPSTICK ONLY
Glucose, UA: NEGATIVE mg/dL
Ketones, ur: NEGATIVE mg/dL
Nitrite: NEGATIVE
PH: 5 (ref 5.0–8.0)
Protein, ur: NEGATIVE mg/dL
Specific Gravity, Urine: 1.023 (ref 1.005–1.030)
Urobilinogen, UA: 0.2 mg/dL (ref 0.0–1.0)

## 2013-07-15 LAB — POCT I-STAT TROPONIN I: Troponin i, poc: 0.01 ng/mL (ref 0.00–0.08)

## 2013-07-15 LAB — CBC
HEMATOCRIT: 40.3 % (ref 36.0–46.0)
HEMOGLOBIN: 13.2 g/dL (ref 12.0–15.0)
MCH: 31.9 pg (ref 26.0–34.0)
MCHC: 32.8 g/dL (ref 30.0–36.0)
MCV: 97.3 fL (ref 78.0–100.0)
Platelets: 158 10*3/uL (ref 150–400)
RBC: 4.14 MIL/uL (ref 3.87–5.11)
RDW: 13.4 % (ref 11.5–15.5)
WBC: 13.3 10*3/uL — AB (ref 4.0–10.5)

## 2013-07-15 LAB — HEPATIC FUNCTION PANEL
ALBUMIN: 4.1 g/dL (ref 3.5–5.2)
ALT: 12 U/L (ref 0–35)
AST: 17 U/L (ref 0–37)
Alkaline Phosphatase: 56 U/L (ref 39–117)
BILIRUBIN TOTAL: 0.3 mg/dL (ref 0.3–1.2)
Bilirubin, Direct: <0.2 mg/dL (ref 0.0–0.3)
TOTAL PROTEIN: 6.6 g/dL (ref 6.0–8.3)

## 2013-07-15 LAB — BASIC METABOLIC PANEL
BUN: 17 mg/dL (ref 6–23)
CHLORIDE: 101 meq/L (ref 96–112)
CO2: 26 mEq/L (ref 19–32)
Calcium: 9.6 mg/dL (ref 8.4–10.5)
Creatinine, Ser: 1.33 mg/dL — ABNORMAL HIGH (ref 0.50–1.10)
GFR, EST AFRICAN AMERICAN: 46 mL/min — AB (ref >90)
GFR, EST NON AFRICAN AMERICAN: 40 mL/min — AB (ref >90)
Glucose, Bld: 169 mg/dL — ABNORMAL HIGH (ref 70–99)
Potassium: 3.3 mEq/L — ABNORMAL LOW (ref 3.7–5.3)
SODIUM: 144 meq/L (ref 137–147)

## 2013-07-15 LAB — OCCULT BLOOD, POC DEVICE: Fecal Occult Bld: POSITIVE — AB

## 2013-07-15 LAB — LIPASE, BLOOD: Lipase: 29 U/L (ref 11–59)

## 2013-07-15 IMAGING — CR DG CHEST 2V
2 series · 2 of 2 positions shown · non-contrast
Comparison: [DATE]

CLINICAL DATA: Chest pain, vomiting, diarrhea, history
hypertension, diabetes

EXAM:
CHEST  2 VIEW

[w chest pa]
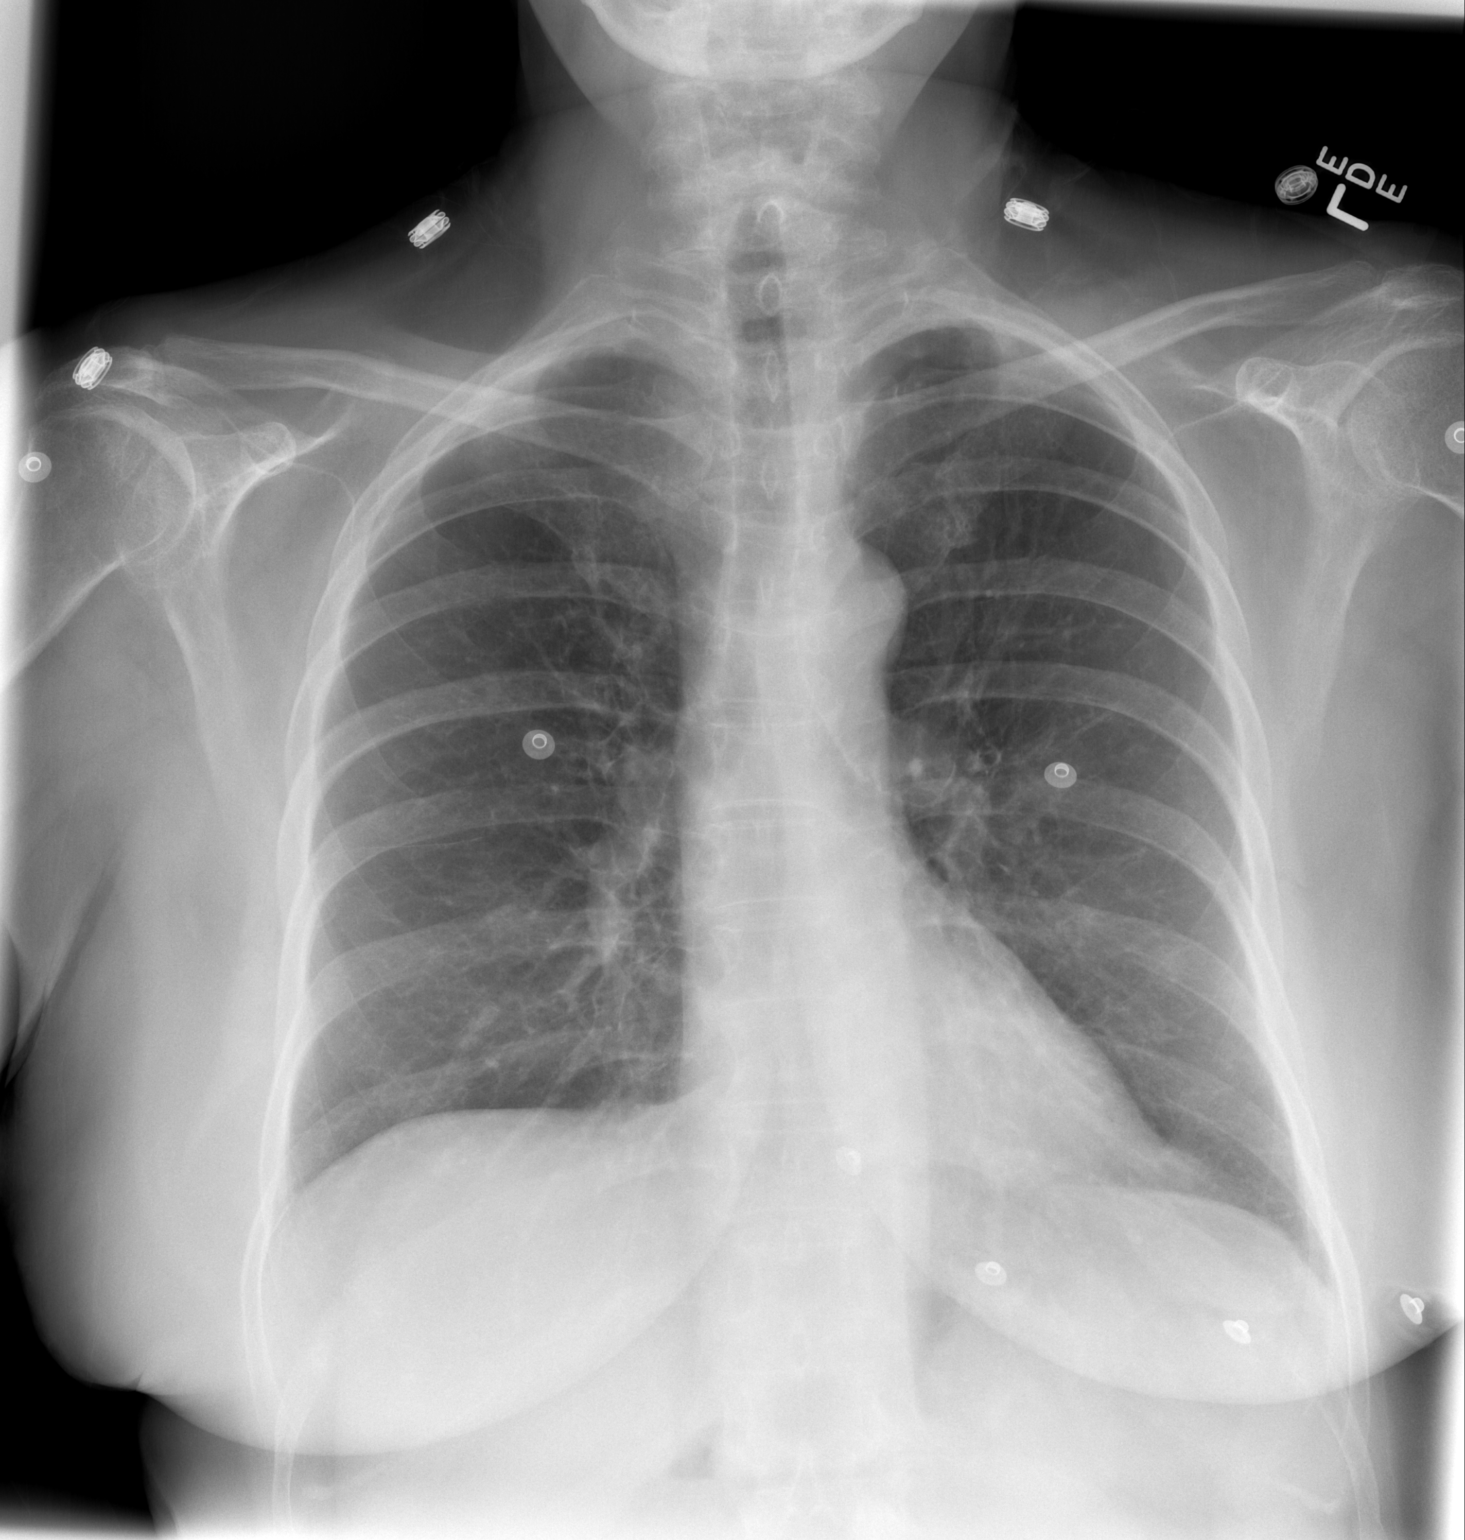

[w chest lat]
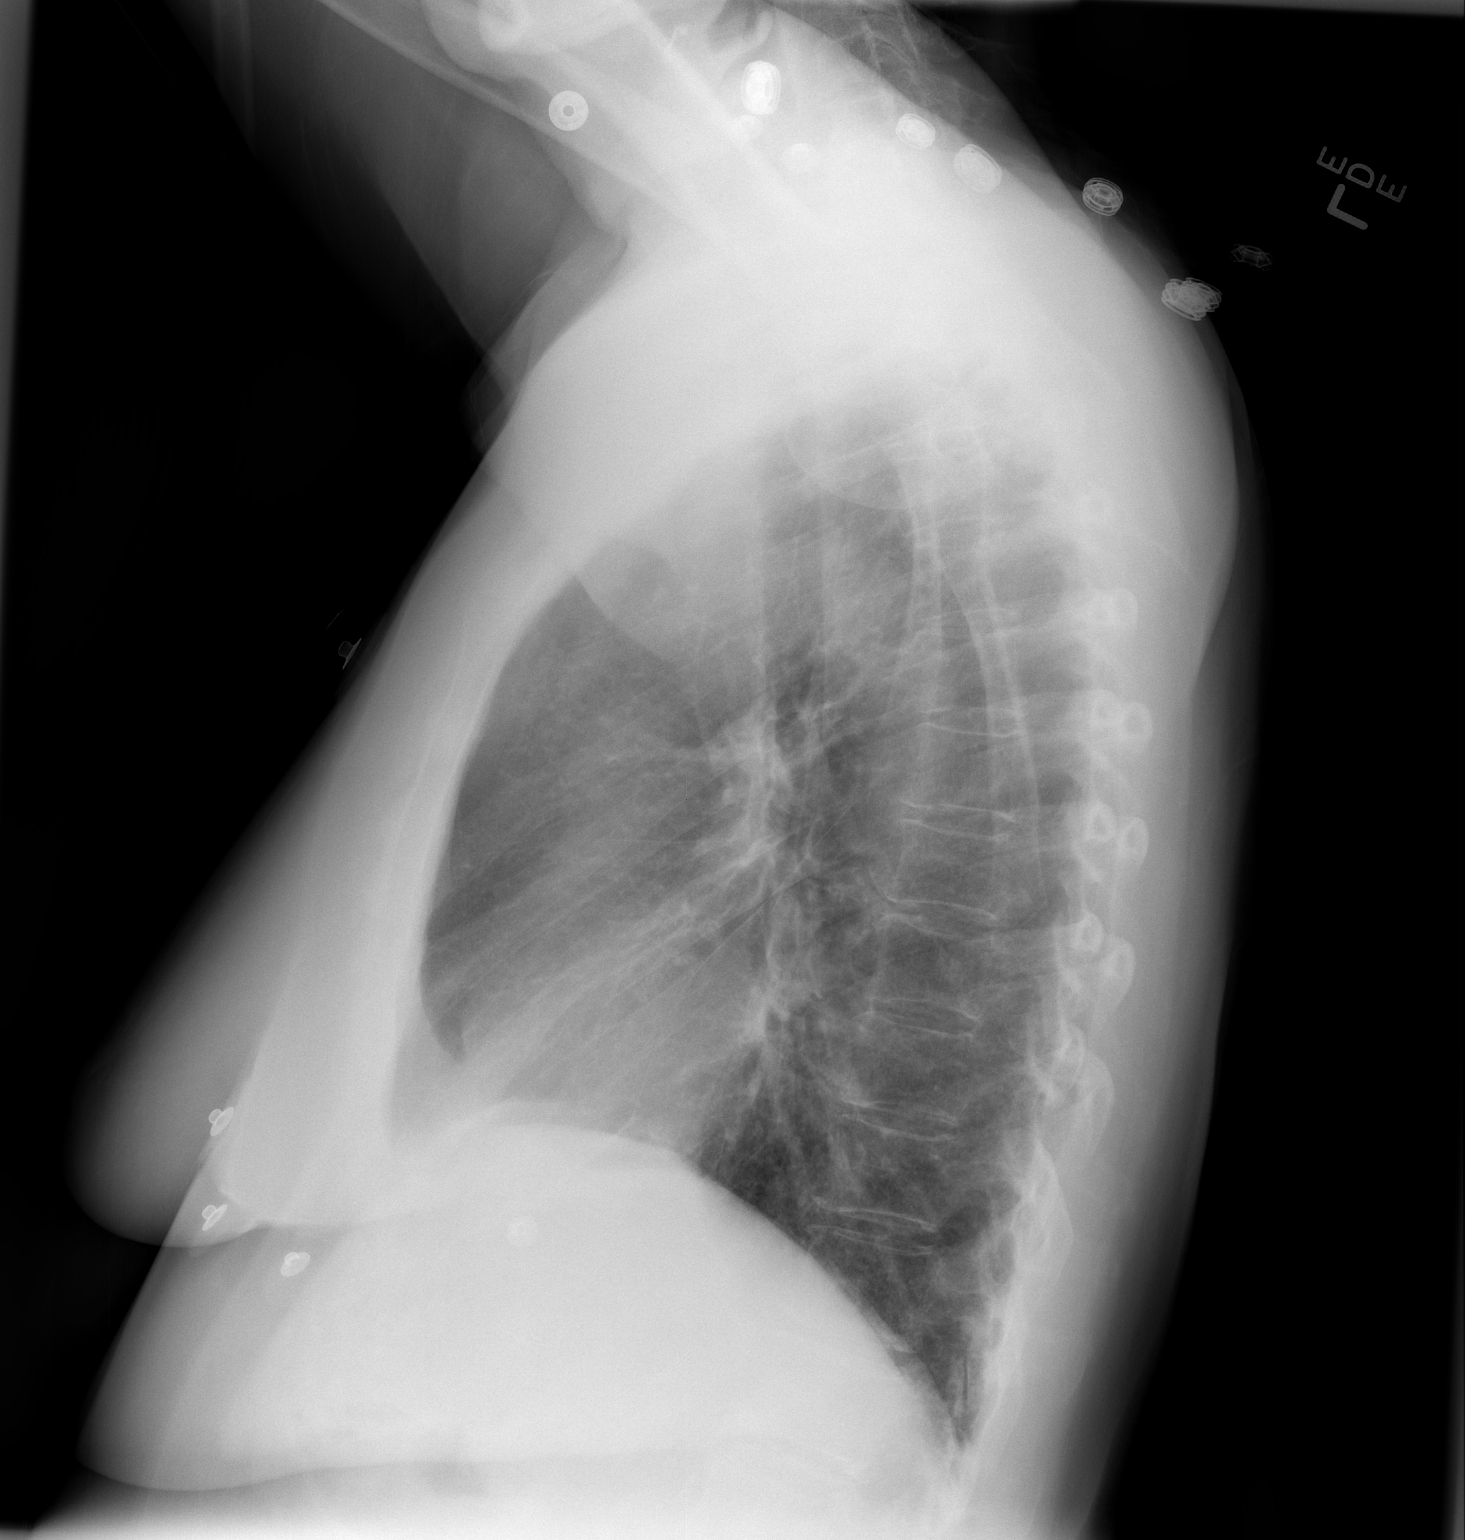

[2 of 2 positions shown; findings below may reference images not displayed]

FINDINGS: Normal heart size, mediastinal contours, and pulmonary vascularity.

Chronic bronchitic changes.

Lungs otherwise clear.

No pleural effusion or pneumothorax.

No acute bony abnormalities.
IMPRESSION: Chronic bronchitic changes.

## 2013-07-15 MED ORDER — SODIUM CHLORIDE 0.9 % IV BOLUS (SEPSIS)
500.0000 mL | Freq: Once | INTRAVENOUS | Status: AC
  Administered 2013-07-15: 500 mL via INTRAVENOUS

## 2013-07-15 NOTE — ED Provider Notes (Signed)
CSN: [id number]     Arrival date & time 07/15/13  0732 History   First MD Initiated Contact with Patient 07/15/13 0757     Chief Complaint  Patient presents with  . Chest Pain  . Emesis  . Diarrhea   (Consider location/radiation/quality/duration/timing/severity/associated sxs/prior Treatment) HPI Comments: 68 yo wf with h/o CAD, HTN, AFib, IBS, presents to ER with cc of vomiting which started at 0000.  Pt has longstanding h/o IBS and diarrhea, but the vomiting is new.    Pt just started Questran (cholestyramine) first dose last night at 1830.  Pt is taking keflex maintenance dose for bladder infections.    No recent travel, no spoiled or poisonous foods, no sick contacts.  No f/c, f/u/d.  No vaginal symptoms.    Pt has a longstanding h/o GI issues chronic diarrhea.  She has an appt at [facility] pending (in June) for a second opinion.  She sees Dr. Oletta Lamas GI at [facility].    Pt had blood in stool last week, admitted [facility] and had a sigmoidoscopy.    Patient is a 68 y.o. female presenting with chest pain, vomiting, and diarrhea. The history is provided by the patient and the spouse.  Chest Pain Pain location:  L chest Pain quality: aching   Pain radiates to:  Does not radiate Pain radiates to the back: no   Pain severity:  Mild Onset quality:  Gradual Duration:  4 hours Timing:  Constant Progression:  Partially resolved Chronicity:  Recurrent Context comment:  Pt was throwing up during  Relieved by:  None tried Worsened by:  Nothing tried Associated symptoms: abdominal pain and vomiting   Associated symptoms: no nausea and no palpitations   Vomiting:    Quality:  Undigested food   Number of occurrences:  Multiple episodes of vomiting since 0000   Severity:  Moderate   Timing:  Intermittent (Last meal - meatloaf and potatoes at 1900) Emesis Associated symptoms: abdominal pain and diarrhea   Diarrhea Associated symptoms: abdominal pain and vomiting     Past Medical History   Diagnosis Date  . Diabetes mellitus   . Hyperlipidemia   . Coronary artery disease     a.  s/p Xience DES to RCA 04/2009;   b. TEE 2/12: EF 40%, Large PFO;  c.  Lexiscan Myoview 05/2012: EF 69%, no ischemia. LHC (05/2012):  Ostial diagonal 30-40%, proximal mid ramus intermedius 40-50%, RCA stent patent with 40-50% after stent, then 40%, distal RCA 40-50%, EF 55-65%. Medical therapy continued.;  d.  Carlton Adam Myoview (06/2013):  No ischemia, EF 83%, normal study  . Hypertension   . BCC (basal cell carcinoma of skin)     dm2  . Hx of echocardiogram     a. Echo (08/2011): EF [id number], grade 1 diastolic dysfunction.   . Atrial fibrillation     s/p RFCA (pulmonary vein isolation) 08/2010 (Dr. Rayann Heman);  Event and Holter monitors (04/2012): No atrial fibrillation.  . Frequent UTI     on keflex for prophylaxis  . Hx of prior ablation treatment   . Stented coronary artery     october 2010  . Irritable bowel syndrome (IBS)    Past Surgical History  Procedure Laterality Date  . Total abdominal hysterectomy    . Appendectomy    . Cholecystectomy    . Spinal cyst removed    . Coronary stent placement    . Atrial fibrillation ablation      by JA 2/12  . Carpal tunnel  release      rigth hand  .  on both removed    . Bunions      removed fron both feet  . Flexible sigmoidoscopy N/A 07/07/2013    Procedure: FLEXIBLE SIGMOIDOSCOPY;  Surgeon: Vertell Novak., MD;  Location: [facility];  Service: Endoscopy;  Laterality: N/A;  unprepped   Family History  Problem Relation Age of Onset  . Coronary artery disease Father   . Heart disease Sister   . Heart disease Mother    History  Substance Use Topics  . Smoking status: Former Smoker -- 1.00 packs/day for 20 years    Quit date: 07/02/1989  . Smokeless tobacco: Not on file  . Alcohol Use: 0.0 oz/week     Comment: wine occasionally   OB History   Grav Para Term Preterm Abortions TAB SAB Ect Mult Living                 Review of Systems   Constitutional: Negative.   HENT: Negative.   Eyes: Negative.   Respiratory: Negative.   Cardiovascular: Positive for chest pain. Negative for palpitations and leg swelling.  Gastrointestinal: Positive for vomiting, abdominal pain, diarrhea and abdominal distention. Negative for nausea, constipation, blood in stool, anal bleeding and rectal pain.  Endocrine: Negative.   Genitourinary: Negative.   Musculoskeletal: Negative.   Skin: Negative.     Allergies  Review of patient's allergies indicates no known allergies.  Home Medications   Current Outpatient Rx  Name  Route  Sig  Dispense  Refill  . anti-nausea (EMETROL) solution   Oral   Take 15 mLs by mouth every 15 (fifteen) minutes as needed for nausea or vomiting.         . cephALEXin (KEFLEX) 500 MG capsule   Oral   Take 500 mg by mouth at bedtime. UTI prophylaxis         . cholestyramine (QUESTRAN) 4 G packet   Oral   Take 4 g by mouth 3 (three) times daily with meals.         . dabigatran (PRADAXA) 150 MG CAPS capsule   Oral   Take 150 mg by mouth 2 (two) times daily.         [name] loperamide (IMODIUM) 2 MG capsule   Oral   Take 2-6 mg by mouth as needed for diarrhea or loose stools.         . metoprolol succinate (TOPROL-XL) 25 MG 24 hr tablet   Oral   Take 1 tablet (25 mg total) by mouth 2 (two) times daily. 12 hours apart   180 tablet   3     Increased dose   . potassium chloride (KLOR-CON) 10 MEQ CR tablet   Oral   Take 20 mEq by mouth every morning.          . rosuvastatin (CRESTOR) 40 MG tablet   Oral   Take 40 mg by mouth daily.           . traZODone (DESYREL) 50 MG tablet   Oral   Take 100 mg by mouth at bedtime.         . nitroGLYCERIN (NITROSTAT) 0.4 MG SL tablet   Sublingual   Place 0.4 mg under the tongue every 5 (five) minutes as needed. For chest pain          BP 113/68  Pulse 72  Temp(Src) 98.1 F (36.7 C) (Oral)  Resp 18  Ht 5\' 2"  (1.575  m)  Wt 167 lb (75.751 kg)   BMI 30.54 kg/m2  SpO2 99% Physical Exam  Nursing note and vitals reviewed. Constitutional: She is oriented to person, place, and time. She appears well-developed and well-nourished.  HENT:  Head: Normocephalic and atraumatic.  Eyes: Conjunctivae are normal. Right eye exhibits no discharge. Left eye exhibits no discharge.  Neck: Normal range of motion. Neck supple. No JVD present.  Cardiovascular: Normal rate.   No murmur heard. Pulmonary/Chest: Effort normal and breath sounds normal. No stridor. No respiratory distress. She has no wheezes. She has no rales. She exhibits no tenderness.  Abdominal: Soft. She exhibits no distension and no mass. There is no tenderness. There is no rebound and no guarding.  Musculoskeletal: Normal range of motion. She exhibits no edema and no tenderness.  Neurological: She is alert and oriented to person, place, and time.  Skin: Skin is warm and dry.    ED Course  Procedures (including critical care time) Labs Review Labs Reviewed  CBC - Abnormal; Notable for the following:    WBC 13.3 (*)    All other components within normal limits  BASIC METABOLIC PANEL - Abnormal; Notable for the following:    Potassium 3.3 (*)    Glucose, Bld 169 (*)    Creatinine, Ser 1.33 (*)    GFR calc non Af Amer 40 (*)    GFR calc Af Amer 46 (*)    All other components within normal limits  URINALYSIS, DIPSTICK ONLY - Abnormal; Notable for the following:    Hgb urine dipstick MODERATE (*)    Bilirubin Urine SMALL (*)    Leukocytes, UA LARGE (*)    All other components within normal limits  OCCULT BLOOD, POC DEVICE - Abnormal; Notable for the following:    Fecal Occult Bld POSITIVE (*)    All other components within normal limits  LIPASE, BLOOD  HEPATIC FUNCTION PANEL  POCT I-STAT TROPONIN I   Imaging Review Dg Chest 2 View  07/15/2013   CLINICAL DATA:  Chest pain, vomiting, diarrhea, history hypertension, diabetes  EXAM: CHEST  2 VIEW  COMPARISON:  12/06/2009   FINDINGS: Normal heart size, mediastinal contours, and pulmonary vascularity.  Chronic bronchitic changes.  Lungs otherwise clear.  No pleural effusion or pneumothorax.  No acute bony abnormalities.  IMPRESSION: Chronic bronchitic changes.   Electronically Signed   By: Lavonia Dana M.D.   On: 07/15/2013 08:54    EKG Interpretation   None      Date: 07/15/2013  Rate: 85  Rhythm: normal sinus rhythm  QRS Axis: normal  Intervals: normal and QT prolonged @ 488  ST/T Wave abnormalities: nonspecific T wave changes  Conduction Disutrbances:none  Narrative Interpretation:   Old EKG Reviewed: changes noted  Results for orders placed during the hospital encounter of 07/15/13  CBC      Result Value Range   WBC 13.3 (*) 4.0 - 10.5 K/uL   RBC 4.14  3.87 - 5.11 MIL/uL   Hemoglobin 13.2  12.0 - 15.0 g/dL   HCT 40.3  36.0 - 46.0 %   MCV 97.3  78.0 - 100.0 fL   MCH 31.9  26.0 - 34.0 pg   MCHC 32.8  30.0 - 36.0 g/dL   RDW 13.4  11.5 - 15.5 %   Platelets 158  150 - 400 K/uL  BASIC METABOLIC PANEL      Result Value Range   Sodium 144  137 - 147 mEq/L   Potassium 3.3 (*)  3.7 - 5.3 mEq/L   Chloride 101  96 - 112 mEq/L   CO2 26  19 - 32 mEq/L   Glucose, Bld 169 (*) 70 - 99 mg/dL   BUN 17  6 - 23 mg/dL   Creatinine, Ser 1.33 (*) 0.50 - 1.10 mg/dL   Calcium 9.6  8.4 - 10.5 mg/dL   GFR calc non Af Amer 40 (*) >90 mL/min   GFR calc Af Amer 46 (*) >90 mL/min  LIPASE, BLOOD      Result Value Range   Lipase 29  11 - 59 U/L  HEPATIC FUNCTION PANEL      Result Value Range   Total Protein 6.6  6.0 - 8.3 g/dL   Albumin 4.1  3.5 - 5.2 g/dL   AST 17  0 - 37 U/L   ALT 12  0 - 35 U/L   Alkaline Phosphatase 56  39 - 117 U/L   Total Bilirubin 0.3  0.3 - 1.2 mg/dL   Bilirubin, Direct <0.2  0.0 - 0.3 mg/dL   Indirect Bilirubin NOT CALCULATED  0.3 - 0.9 mg/dL  URINALYSIS, DIPSTICK ONLY      Result Value Range   Specific Gravity, Urine 1.023  1.005 - 1.030   pH 5.0  5.0 - 8.0   Glucose, UA NEGATIVE   NEGATIVE mg/dL   Hgb urine dipstick MODERATE (*) NEGATIVE   Bilirubin Urine SMALL (*) NEGATIVE   Ketones, ur NEGATIVE  NEGATIVE mg/dL   Protein, ur NEGATIVE  NEGATIVE mg/dL   Urobilinogen, UA 0.2  0.0 - 1.0 mg/dL   Nitrite NEGATIVE  NEGATIVE   Leukocytes, UA LARGE (*) NEGATIVE  OCCULT BLOOD, POC DEVICE      Result Value Range   Fecal Occult Bld POSITIVE (*) NEGATIVE  POCT I-STAT TROPONIN I      Result Value Range   Troponin i, poc 0.01  0.00 - 0.08 ng/mL   Comment 3              MDM   1. Diarrhea   2. Vomiting   3. Abdominal pain    68 year old white female with history of atrial fibrillation, coronary artery disease with history of drug-eluting stent to the RCA in October 2010, echo in February 2012 reveal EF 40%, large PFO; Myoview 06/2013 - no ischemia, EF nl; irritable bowel syndrome, chronic diarrhea followed by gastroenterology Dr. Percell Miller, presents with acute nausea and vomiting and diarrhea and epigastric pain. Patient just started a new medication cholestyramine and first dose was taken last night at 1830. Patient developed acute vomiting at 0030.  EMS was called this morning and patient was given aspirin 324 mg nitroglycerin x1 by mouth for epigastric/chest pain.  On history and physical patient is in no acute distress, no active retching or vomiting, vital signs are stable, chest pain is improved. Plan to perform basic lab work given IV bolus of 500 mL normal saline, and monitor. We'll obtain troponin with basic labwork.    1:18 PM Patient is afebrile her vital signs are stable. She is feeling much better. She requests to go home. Her symptoms are likely multifactorial in etiology however considerations included serious bacterial illness, C. difficile, medication related, IBS, surgical abdomen. Also acute gastroenteritis is a possibility. Patient has a long-standing history of the above symptoms. She is on chronic Keflex however I do not feel C. difficile is the cause at this  time. Her lab work is at near baseline however she does have an elevated  white blood cell count and she did have positive fecal occult blood. Her potassium was mildly low at 3.3. Her creatinine was 1.33. Her symptoms began approximately 5 hours after taking cholestyramine for the first time. There may be a possible medication association. Plan to withhold taking cholestyramine at this time  The patient and her husband are very comfortable with discharge home.  They informed me that she has a followup appointment tomorrow at [facility] gastroenterology. They plan to continue her current medications however they will stop cholestyramine. Strict ER precautions given and the patient and her husband agree with the plan.   Elmer Sow, MD 07/15/13 1321

## 2013-07-15 NOTE — ED Notes (Signed)
Family at bedside.husband

## 2013-07-15 NOTE — ED Notes (Signed)
C/o SSCP onset 0400 as well as n/v/d since midnight. Given ASA 324mg , NTG x1 by EMS with some relief.

## 2013-07-15 NOTE — ED Notes (Signed)
EKG shown to MD [facility] who initialed same

## 2013-07-15 NOTE — ED Notes (Addendum)
Pt reports she has 10-15 episodes diarrhea daily x 15 yrs but since midnight it has been more watery & uncontrollable. States took imodium x 3 yesterday morning. States GI MD prescribed med for diarrhea yesterday but only had 1 dose. States unable to keep nausea or diarrhea med down. Reports CP was substernal at onset but since then has now moved to left side & is going away. States nothing makes pain worse or better. Also c/o chills, denies SOB, cold, cough, diaphoresis, abd pain. Pt sigmoidoscopy a week ago.

## 2013-07-15 NOTE — Discharge Instructions (Signed)
Abdominal Pain, Adult °Many things can cause abdominal pain. Usually, abdominal pain is not caused by a disease and will improve without treatment. It can often be observed and treated at home. Your health care provider will do a physical exam and possibly order blood tests and X-rays to help determine the seriousness of your pain. However, in many cases, more time must pass before a clear cause of the pain can be found. Before that point, your health care provider may not know if you need more testing or further treatment. °HOME CARE INSTRUCTIONS  °Monitor your abdominal pain for any changes. The following actions may help to alleviate any discomfort you are experiencing: °· Only take over-the-counter or prescription medicines as directed by your health care provider. °· Do not take laxatives unless directed to do so by your health care provider. °· Try a clear liquid diet (broth, tea, or water) as directed by your health care provider. Slowly move to a bland diet as tolerated. °SEEK MEDICAL CARE IF: °· You have unexplained abdominal pain. °· You have abdominal pain associated with nausea or diarrhea. °· You have pain when you urinate or have a bowel movement. °· You experience abdominal pain that wakes you in the night. °· You have abdominal pain that is worsened or improved by eating food. °· You have abdominal pain that is worsened with eating fatty foods. °SEEK IMMEDIATE MEDICAL CARE IF:  °· Your pain does not go away within 2 hours. °· You have a fever. °· You keep throwing up (vomiting). °· Your pain is felt only in portions of the abdomen, such as the right side or the left lower portion of the abdomen. °· You pass bloody or black tarry stools. °MAKE SURE YOU: °· Understand these instructions.   °· Will watch your condition.   °· Will get help right away if you are not doing well or get worse.   °Document Released: 03/28/2005 Document Revised: 04/08/2013 Document Reviewed: 02/25/2013 °Exit[employer]® Patient  Information ©2014 [employer]. °Diarrhea °Diarrhea is frequent loose and watery bowel movements. It can cause you to feel weak and dehydrated. Dehydration can cause you to become tired and thirsty, have a dry mouth, and have decreased urination that often is dark yellow. Diarrhea is a sign of another problem, most often an infection that will not last long. In most cases, diarrhea typically lasts 2 3 days. However, it can last longer if it is a sign of something more serious. It is important to treat your diarrhea as directed by your caregive to lessen or prevent future episodes of diarrhea. °CAUSES  °Some common causes include: °· Gastrointestinal infections caused by viruses, bacteria, or parasites. °· Food poisoning or food allergies. °· Certain medicines, such as antibiotics, chemotherapy, and laxatives. °· Artificial sweeteners and fructose. °· Digestive disorders. °HOME CARE INSTRUCTIONS °· Ensure adequate fluid intake (hydration): have 1 cup (8 oz) of fluid for each diarrhea episode. Avoid fluids that contain simple sugars or sports drinks, fruit juices, whole milk products, and sodas. Your urine should be clear or pale yellow if you are drinking enough fluids. Hydrate with an oral rehydration solution that you can purchase at pharmacies, retail stores, and online. You can prepare an oral rehydration solution at home by mixing the following ingredients together: °·   tsp table salt. °· ¾ tsp baking soda. °·  tsp salt substitute containing potassium chloride. °· 1  tablespoons sugar. °· 1 L (34 oz) of water. °· Certain foods and beverages may increase the speed   speed at which food moves through the gastrointestinal (GI) tract. These foods and beverages should be avoided and include: °· Caffeinated and alcoholic beverages. °· High-fiber foods, such as raw fruits and vegetables, nuts, seeds, and whole grain breads and cereals. °· Foods and beverages sweetened with sugar alcohols, such as xylitol, sorbitol, and  mannitol. °· Some foods may be well tolerated and may help thicken stool including: °· Starchy foods, such as rice, toast, pasta, low-sugar cereal, oatmeal, grits, baked potatoes, crackers, and bagels. °· Bananas. °· Applesauce. °· Add probiotic-rich foods to help increase healthy bacteria in the GI tract, such as yogurt and fermented milk products. °· Wash your hands well after each diarrhea episode. °· Only take over-the-counter or prescription medicines as directed by your caregiver. °· Take a warm bath to relieve any burning or pain from frequent diarrhea episodes. °SEEK IMMEDIATE MEDICAL CARE IF:  °· You are unable to keep fluids down. °· You have persistent vomiting. °· You have blood in your stool, or your stools are black and tarry. °· You do not urinate in 6 8 hours, or there is only a small amount of very dark urine. °· You have abdominal pain that increases or localizes. °· You have weakness, dizziness, confusion, or lightheadedness. °· You have a severe headache. °· Your diarrhea gets worse or does not get better. °· You have a fever or persistent symptoms for more than 2 3 days. °· You have a fever and your symptoms suddenly get worse. °MAKE SURE YOU:  °· Understand these instructions. °· Will watch your condition. °· Will get help right away if you are not doing well or get worse. °Document Released: 06/08/2002 Document Revised: 06/04/2012 Document Reviewed: 02/24/2012 °ExitCare® Patient Information ©2014 [employer]. ° °Nausea and Vomiting °Nausea is a sick feeling that often comes before throwing up (vomiting). Vomiting is a reflex where stomach contents come out of your mouth. Vomiting can cause severe loss of body fluids (dehydration). Children and elderly adults can become dehydrated quickly, especially if they also have diarrhea. Nausea and vomiting are symptoms of a condition or disease. It is important to find the cause of your symptoms. °CAUSES  °· Direct irritation of the stomach lining.  This irritation can result from increased acid production (gastroesophageal reflux disease), infection, food poisoning, taking certain medicines (such as nonsteroidal anti-inflammatory drugs), alcohol use, or tobacco use. °· Signals from the brain. These signals could be caused by a headache, heat exposure, an inner ear disturbance, increased pressure in the brain from injury, infection, a tumor, or a concussion, pain, emotional stimulus, or metabolic problems. °· An obstruction in the gastrointestinal tract (bowel obstruction). °· Illnesses such as diabetes, hepatitis, gallbladder problems, appendicitis, kidney problems, cancer, sepsis, atypical symptoms of a heart attack, or eating disorders. °· Medical treatments such as chemotherapy and radiation. °· Receiving medicine that makes you sleep (general anesthetic) during surgery. °DIAGNOSIS °Your caregiver may ask for tests to be done if the problems do not improve after a few days. Tests may also be done if symptoms are severe or if the reason for the nausea and vomiting is not clear. Tests may include: °· Urine tests. °· Blood tests. °· Stool tests. °· Cultures (to look for evidence of infection). °· X-rays or other imaging studies. °Test results can help your caregiver make decisions about treatment or the need for additional tests. °TREATMENT °You need to stay well hydrated. Drink frequently but in small amounts. You may wish to drink water, sports drinks, clear broth,   or eat frozen ice pops or gelatin dessert to help stay hydrated.When you eat, eating slowly may help prevent nausea.There are also some antinausea medicines that may help prevent nausea. HOME CARE INSTRUCTIONS   Take all medicine as directed by your caregiver.  If you do not have an appetite, do not force yourself to eat. However, you must continue to drink fluids.  If you have an appetite, eat a normal diet unless your caregiver tells you differently.  Eat a variety of complex  carbohydrates (rice, wheat, potatoes, bread), lean meats, yogurt, fruits, and vegetables.  Avoid high-fat foods because they are more difficult to digest.  Drink enough water and fluids to keep your urine clear or pale yellow.  If you are dehydrated, ask your caregiver for specific rehydration instructions. Signs of dehydration may include:  Severe thirst.  Dry lips and mouth.  Dizziness.  Dark urine.  Decreasing urine frequency and amount.  Confusion.  Rapid breathing or pulse. SEEK IMMEDIATE MEDICAL CARE IF:   You have blood or brown flecks (like coffee grounds) in your vomit.  You have black or bloody stools.  You have a severe headache or stiff neck.  You are confused.  You have severe abdominal pain.  You have chest pain or trouble breathing.  You do not urinate at least once every 8 hours.  You develop cold or clammy skin.  You continue to vomit for longer than 24 to 48 hours.  You have a fever. MAKE SURE YOU:   Understand these instructions.  Will watch your condition.  Will get help right away if you are not doing well or get worse. Document Released: 06/18/2005 Document Revised: 09/10/2011 Document Reviewed: 11/15/2010 [facility] Patient Information 2014 [place], [place].

## 2013-07-31 ENCOUNTER — Encounter: Payer: Self-pay | Admitting: Internal Medicine

## 2013-08-05 ENCOUNTER — Encounter: Payer: Self-pay | Admitting: Internal Medicine

## 2013-08-05 ENCOUNTER — Ambulatory Visit (INDEPENDENT_AMBULATORY_CARE_PROVIDER_SITE_OTHER): Payer: Medicare Other | Admitting: Internal Medicine

## 2013-08-05 VITALS — BP 130/68 | HR 68 | Ht 62.0 in | Wt 167.1 lb

## 2013-08-05 DIAGNOSIS — I10 Essential (primary) hypertension: Secondary | ICD-10-CM

## 2013-08-05 DIAGNOSIS — I4891 Unspecified atrial fibrillation: Secondary | ICD-10-CM

## 2013-08-05 DIAGNOSIS — I2510 Atherosclerotic heart disease of native coronary artery without angina pectoris: Secondary | ICD-10-CM

## 2013-08-05 NOTE — Progress Notes (Signed)
PCP:  [name], MD  The patient presents today for electrophysiology followup.  Since last being seen in our clinic, the patient reports doing very well.  Afib is well controlled. She has had no further palpitations.  She denies any further chest tightness or SOB.   She has been struggling with diarrhea.  She saw GI at [facility] who stopped several medicines.  She is back on pradaxa and metoprolol but remains off of crestor and lisinopril.  Her symptoms are better. Today, she denies symptoms of orthopnea, PND, lower extremity edema, dizziness, presyncope, syncope, or neurologic sequela.   The patient feels that she is tolerating medications without difficulties and is otherwise without complaint today.   Past Medical History  Diagnosis Date  . Diabetes mellitus   . Hyperlipidemia   . Coronary artery disease     a.  s/p Xience DES to RCA 04/2009;   b. TEE 2/12: EF 40%, Large PFO;  c.  Lexiscan Myoview 05/2012: EF 69%, no ischemia. LHC (05/2012):  Ostial diagonal 30-40%, proximal mid ramus intermedius 40-50%, RCA stent patent with 40-50% after stent, then 40%, distal RCA 40-50%, EF 55-65%. Medical therapy continued.;  d.  Carlton Adam Myoview (06/2013):  No ischemia, EF 83%, normal study  . Hypertension   . BCC (basal cell carcinoma of skin)     dm2  . Hx of echocardiogram     a. Echo (08/2011): EF 78-29%, grade 1 diastolic dysfunction.   . Atrial fibrillation     s/p RFCA (pulmonary vein isolation) 08/2010 (Dr. Rayann Heman);  Event and Holter monitors (04/2012): No atrial fibrillation.  . Frequent UTI     on keflex for prophylaxis  . Hx of prior ablation treatment   . Stented coronary artery     october 2010  . Irritable bowel syndrome (IBS)    Past Surgical History  Procedure Laterality Date  . Total abdominal hysterectomy    . Appendectomy    . Cholecystectomy    . Spinal cyst removed    . Coronary stent placement    . Atrial fibrillation ablation      by JA 2/12  . Carpal tunnel release       rigth hand  .  on both removed    . Bunions      removed fron both feet  . Flexible sigmoidoscopy N/A 07/07/2013    Procedure: FLEXIBLE SIGMOIDOSCOPY;  Surgeon: Winfield Cunas., MD;  Location: [facility];  Service: Endoscopy;  Laterality: N/A;  unprepped    Current Outpatient Prescriptions  Medication Sig Dispense Refill  . dabigatran (PRADAXA) 150 MG CAPS capsule Take 150 mg by mouth 2 (two) times daily.      . metoprolol succinate (TOPROL-XL) 25 MG 24 hr tablet Take 1 tablet (25 mg total) by mouth 2 (two) times daily. 12 hours apart  180 tablet  3  . nitroGLYCERIN (NITROSTAT) 0.4 MG SL tablet Place 0.4 mg under the tongue every 5 (five) minutes as needed. For chest pain       No current facility-administered medications for this visit.    No Known Allergies  History   Social History  . Marital Status: Married    Spouse Name: N/A    Number of Children: N/A  . Years of Education: N/A   Occupational History  . retired    Social History Main Topics  . Smoking status: Former Smoker -- 1.00 packs/day for 20 years    Quit date: 07/02/1989  . Smokeless tobacco: Not  on file  . Alcohol Use: 0.0 oz/week     Comment: wine occasionally  . Drug Use: No  . Sexual Activity: Not on file   Other Topics Concern  . Not on file   Social History Narrative  . No narrative on file    Family History  Problem Relation Age of Onset  . Coronary artery disease Father   . Heart disease Sister   . Heart disease Mother     Physical Exam: Filed Vitals:   08/05/13 1132  BP: 130/68  Pulse: 68  Height: 5\' 2"  (1.575 m)  Weight: 167 lb 1.9 oz (75.805 kg)    GEN- The patient is well appearing, alert and oriented x 3 today.   Head- normocephalic, atraumatic Eyes-  Sclera clear, conjunctiva pink Ears- hearing intact Oropharynx- clear Neck- supple, no JVP Lymph- no cervical lymphadenopathy Lungs- Clear to ausculation bilaterally, normal work of breathing Heart- Regular rate and  rhythm, no murmurs, rubs or gallops, PMI not laterally displaced GI- soft, NT, ND, + BS Extremities- no clubbing, cyanosis, or edema MS- no significant deformity or atrophy Skin- no rash or lesion Psych- euthymic mood, full affect Neuro- strength and sensation are intact  Event monitor and myoview are reviewed today  ekg today reveals sinus rhythm 58 bpm, otherwise normal ekg  Assessment and Plan:   1. afib Well controlled, no afib on recent event monitor Continue pradaxa for stroke prevention chads2vasc score is at least 5  2. CAD No ischemic symptoms No changes  3. HL Consider restarting crestor if GI symptoms remain resolved upon return  4. HTN Stable No change required today  Return to see Nicki Reaper in 2 months I will see in 6 months

## 2013-08-05 NOTE — Patient Instructions (Signed)
Your physician recommends that you schedule a follow-up appointment in: 2 months with Margaret Pyle and 6 months with Dr Rayann Heman

## 2013-08-07 ENCOUNTER — Telehealth: Payer: Self-pay | Admitting: *Deleted

## 2013-08-07 NOTE — Telephone Encounter (Signed)
pt notified about monitor results. Pt states saw Dr. Rayann Heman Wed 2/4 and was told then of the results. I apologized for my call today and pt said that was ok.

## 2013-08-10 ENCOUNTER — Other Ambulatory Visit: Payer: Medicare Other

## 2013-08-10 ENCOUNTER — Encounter: Payer: Self-pay | Admitting: Physician Assistant

## 2013-08-10 ENCOUNTER — Ambulatory Visit (INDEPENDENT_AMBULATORY_CARE_PROVIDER_SITE_OTHER): Payer: Medicare Other | Admitting: Physician Assistant

## 2013-08-10 ENCOUNTER — Other Ambulatory Visit: Payer: Self-pay | Admitting: Physician Assistant

## 2013-08-10 VITALS — BP 124/52 | HR 72 | Ht 62.0 in | Wt 166.0 lb

## 2013-08-10 DIAGNOSIS — R6889 Other general symptoms and signs: Secondary | ICD-10-CM

## 2013-08-10 DIAGNOSIS — R197 Diarrhea, unspecified: Secondary | ICD-10-CM

## 2013-08-10 DIAGNOSIS — R109 Unspecified abdominal pain: Secondary | ICD-10-CM

## 2013-08-10 DIAGNOSIS — R112 Nausea with vomiting, unspecified: Secondary | ICD-10-CM

## 2013-08-10 MED ORDER — ONDANSETRON 4 MG PO TBDP: 4.0000 mg | ORAL_TABLET | Freq: Three times a day (TID) | ORAL | Status: DC | PRN

## 2013-08-10 NOTE — Patient Instructions (Addendum)
Please go to the basement level to have your labs drawn and urine test also.  Keep on colestipol medication twice daily. Continue the Imodium as needed for diarrhera. We have given you enough samples of Xifaxan 550 mg .  Take 1 tab twice daily for 7 days.   You have been scheduled for a CT scan of the abdomen and pelvis at [facility] ([address]---this is in the same building as [facility]).   You are scheduled on Friday 2-13 at 2:30 PM . You should arrive  At  1:30  PM prior to your appointment time for registration. Please follow the written instructions below on the day of your exam:  WARNING: IF YOU ARE ALLERGIC TO IODINE/X-RAY DYE, PLEASE NOTIFY RADIOLOGY IMMEDIATELY AT [phone]! YOU WILL BE GIVEN A 13 HOUR PREMEDICATION PREP.  1) Do not eat or drink anything after 10:30 AM  (4 hours prior to your test) 2. You will be drinking contrast at [place].  You may take any medications as prescribed with a small amount of water except for the following: Metformin, Glucophage, Glucovance, Avandamet, Riomet, Fortamet, Actoplus Met, Janumet, Glumetza or Metaglip. The above medications must be held the day of the exam AND 48 hours after the exam.  The purpose of you drinking the oral contrast is to aid in the visualization of your intestinal tract. The contrast solution may cause some diarrhea. Before your exam is started, you will be given a small amount of fluid to drink. Depending on your individual set of symptoms, you may also receive an intravenous injection of x-ray contrast/dye. Plan on being at [facility] for 30 minutes or long, depending on the type of exam you are having performed.  If you have any questions regarding your exam or if you need to reschedule, you may call the CT department at [phone] between the hours of 8:00 am and 5:00 pm, Monday-Friday.  ________________________________________________________________________

## 2013-08-11 ENCOUNTER — Encounter: Payer: Self-pay | Admitting: Physician Assistant

## 2013-08-11 LAB — GASTRIN, SERUM: GASTRIN: 27 pg/mL (ref 0–115)

## 2013-08-11 NOTE — Progress Notes (Addendum)
Subjective:    Patient ID: Beaulah Corin, female    DOB: August 27, 1945, 68 y.o.   MRN: [id number]  HPI  Kaziah is a pleasant 68 year old white female new to [place] today. She has history of coronary artery disease is status post stent x21 in 2010 and 1 in 2013. She is maintained on Pradaxa and followed by Dr. Rayann Heman. She also has history of atrial fibrillation, adult onset diabetes mellitus, hypertension, and mild CHF. She is status post cholecystectomy in 1973 ,and says she has had some problems with diarrhea ever since then. She has had 4-5 loose stools per day over the past 40 years. She says her symptoms have worsened somewhat over the past 5 years and over the past 6 months she has been having recurrent severe episodes of diarrhea ,abdominal cramping ,nausea and vomiting. She says over the past few years she has been taking up to 12 Imodium per day and will generally still have 4-5 bowel movements per day. Over the past year or so she had been tried on Lotronex without any benefit and has also tried Lomotil without any benefit. She had a full colonoscopy done in [place] in 2013 which apparently was unremarkable with the exception of small polyps. She was hospitalized in January 2015 after one of her severe episodes of diarrhea nausea and vomiting and that episode had some blood in her bowel movements as well. She had undergone flexible sigmoidoscopy by Dr. Laurence Spates done at the hospital to rule out microscopic colitis. Biopsies were unremarkable. During that hospitalization she also had a GI pathogen panel, lactoferrin, stool for occult blood old negative and had celiac markers with TTG antigliadin antibodies and IgA all negative as well. Patient has also been seen at [facility] by Dr. Serena Colonel on 07/28/2013 pressure went for another opinion. She has lost about 24 pounds over the past 4 months. She says in between the severe episodes she's not having much abdominal pain but when she  gets her episodes she starts with belching and burping of sour bitter material followed by nausea, vomiting and explosive diarrhea. She says these episodes last for 6-8 hours. She says generally she does have some diaphoresis with ease and has noticed some flushing of her face recently as well. She is weak and washed out and often laying on the bathroom floor at the end of these episodes.. She had been started on Colestid over the past week per Dr. Eliberto Ivory; she's not certain if this is helping perhaps a little bit. She has not had any empiric treatment for small bowel bacterial overgrowth. Family history is negative for GI disease as far she is aware,    Review of Systems  Constitutional: Positive for appetite change and unexpected weight change.  HENT: Negative.   Eyes: Negative.   Respiratory: Negative.   Cardiovascular: Negative.   Gastrointestinal: Positive for nausea, vomiting, abdominal pain, diarrhea and abdominal distention.  Endocrine: Negative.   Genitourinary: Negative.   Allergic/Immunologic: Negative.   Neurological: Negative.   Hematological: Negative.   Psychiatric/Behavioral: Negative.    Outpatient Prescriptions Prior to Visit  Medication Sig Dispense Refill  . dabigatran (PRADAXA) 150 MG CAPS capsule Take 150 mg by mouth 2 (two) times daily.      . metoprolol succinate (TOPROL-XL) 25 MG 24 hr tablet Take 1 tablet (25 mg total) by mouth 2 (two) times daily. 12 hours apart  180 tablet  3  . nitroGLYCERIN (NITROSTAT) 0.4 MG SL tablet Place 0.4 mg  under the tongue every 5 (five) minutes as needed. For chest pain       No facility-administered medications prior to visit.      No Known Allergies Patient Active Problem List   Diagnosis Date Noted  . CHF (congestive heart failure) 07/15/2013  . Precordial pain 10/19/2010  . EDEMA 12/16/2009  . Atrial fibrillation 08/01/2009  . PULMONARY NODULE, LEFT LOWER LOBE 04/28/2009  . DIABETES MELLITUS, TYPE II 04/27/2009  .  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 SKIN CANCER, HX OF 04/27/2009  . HYPERLIPIDEMIA-MIXED 04/11/2009  . CAD, NATIVE VESSEL 04/11/2009  . DYSPNEA 04/11/2009  . HEARTBURN 04/11/2009  . Essential hypertension, benign 04/01/2009  . ANGINA, UNSTABLE 04/01/2009   History  Substance Use Topics  . Smoking status: Former Smoker -- 1.00 packs/day for 20 years    Quit date: 07/02/1989  . Smokeless tobacco: Not on file  . Alcohol Use: 0.0 oz/week     Comment: wine occasionally   family history includes Coronary artery disease in her father; Heart disease in her mother and sister.  Objective:   Physical Exam  iolder white female in no acute distress, pleasant blood pressure 124/52 pulse 72 height 5 foot 2 weight 166. 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 Patient has had a 25 pound weight loss since onset. Etiology not clear. Recent hospitalization after one of these episodes associated with dehydration. Patient had flexible sigmoidoscopy with random biopsies which were negative  for microscopic colitis. Other possibilities that need to be ruled out include secretory etiologies, will also empirically treat her for small bowel bacterial overgrowth. We'll also further evaluate small bowel to rule out possibility of a yet undiagnosed inflammatory bowel disease The patient had been on chronic low-dose Keflex for chronic urinary tract infections and this was recently discontinued  #2 adult-onset diabetes mellitus #3 hyperlipidemia #4 hypertension #5 coronary artery disease status post stents #6 chronic antiplatelet therapy with pradaxa  Plan; CT of the abdomen and pelvis with CT enterography empiric course of Xifaxan 550 mg twice daily x7 days samples given Continue colestipol twice daily Continue Imodium as needed 24 hour urine for 5 HIAA, serum VIP level and serum gastrin level Zofran sl  4 mg every 6 hours as needed for nausea-she is instructed to dissolve 1 on her tongue at onset of nausea and belching which generally perceives her more severe episodes Followup with Dr. Hilarie Fredrickson in 2-3 weeks  Addendum: Reviewed and agree with initial management. Jerene Bears, MD

## 2013-08-13 LAB — VASOACTIVE INTESTINAL PEPTIDE (VIP)

## 2013-08-14 ENCOUNTER — Ambulatory Visit (INDEPENDENT_AMBULATORY_CARE_PROVIDER_SITE_OTHER)
Admission: RE | Admit: 2013-08-14 | Discharge: 2013-08-14 | Disposition: A | Discharge status: Home / Self Care | Payer: Medicare Other | Source: Ambulatory Visit | Attending: Physician Assistant | Admitting: Physician Assistant

## 2013-08-14 DIAGNOSIS — R112 Nausea with vomiting, unspecified: Secondary | ICD-10-CM

## 2013-08-14 DIAGNOSIS — R197 Diarrhea, unspecified: Secondary | ICD-10-CM

## 2013-08-14 DIAGNOSIS — R109 Unspecified abdominal pain: Secondary | ICD-10-CM

## 2013-08-14 DIAGNOSIS — R6889 Other general symptoms and signs: Secondary | ICD-10-CM

## 2013-08-14 IMAGING — CT CT ENTEROGRAPHY (ABD-PELV W/ CM)
2 of 6 series · 16 of 46 positions shown, 18 images · IV contrast (Omnipaque 300)
Comparison: CT ABD/PELV WO CM dated [DATE]; CT ANGIO CHEST W/CM
&/OR WO/CM dated [DATE]

CLINICAL DATA: Severe chronic diarrhea with nausea, vomiting and
worsening abdominal pain.

EXAM:
CT ABDOMEN AND PELVIS WITH CONTRAST (ENTEROGRAPHY)
TECHNIQUE: Multidetector CT of the abdomen and pelvis during bolus
administration of intravenous contrast. Negative oral contrast
VoLumen was given.
CONTRAST:  100mL OMNIPAQUE IOHEXOL 300 MG/ML  SOLN

[Series 4: enterography standard · axial · 0.70mm/px · z∈[-522,-46]mm · 13 of 266 slices shown, 15 images]
[im 14/266  soft-tissue]
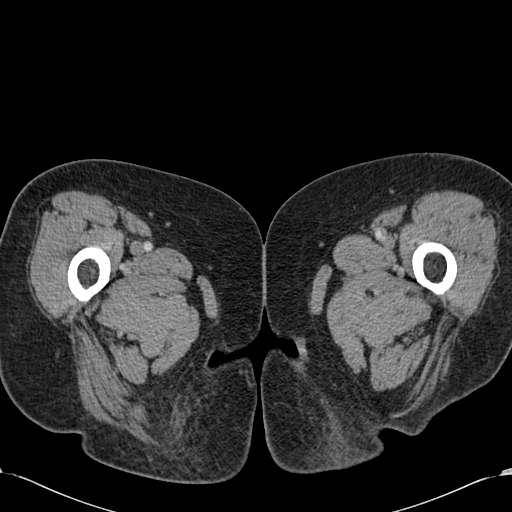
[im 14/266  bone]
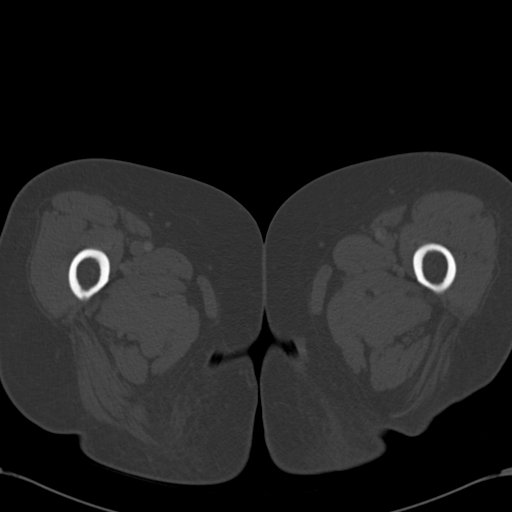
[im 42/266  soft-tissue]
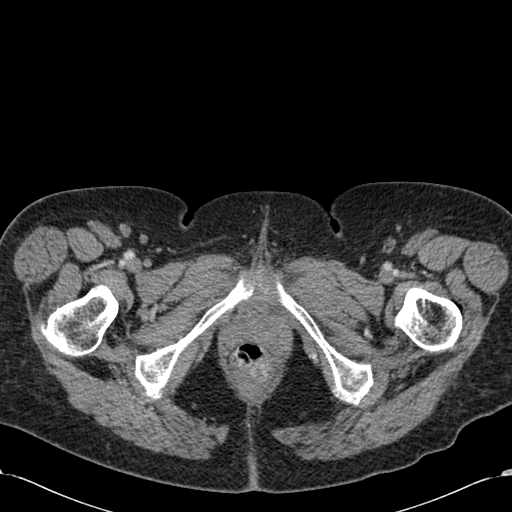
[im 56/266  soft-tissue]
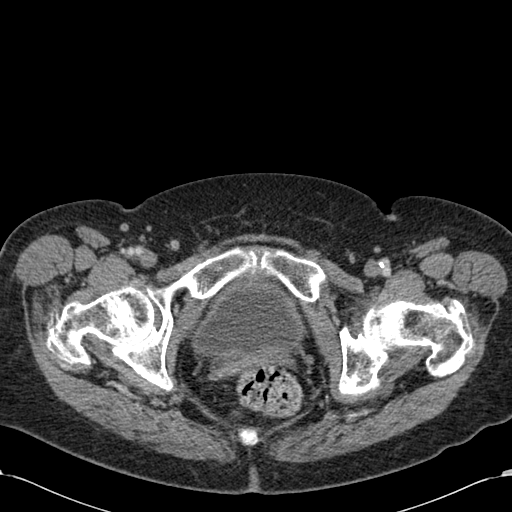
[im 70/266  soft-tissue]
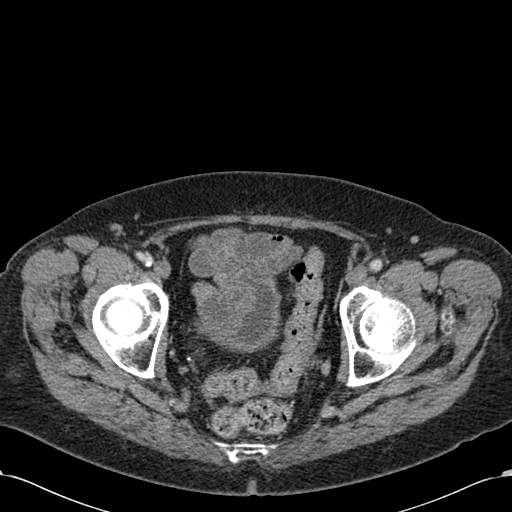
[im 98/266  soft-tissue]
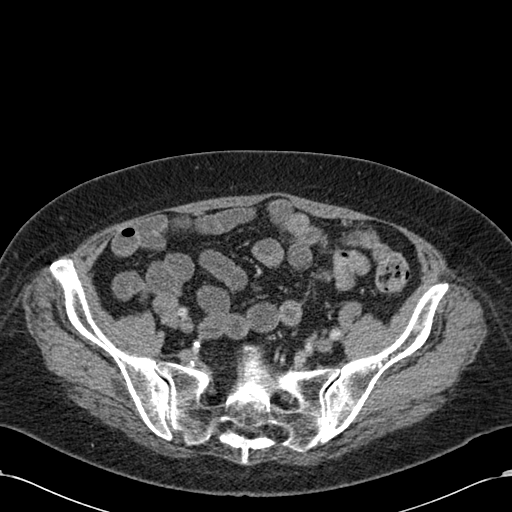
[im 112/266  soft-tissue]
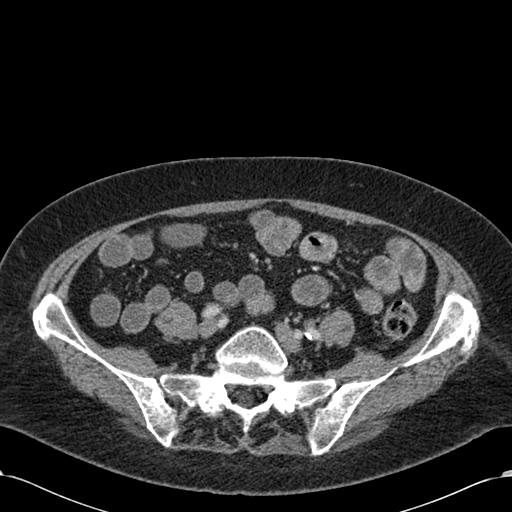
[im 140/266  soft-tissue]
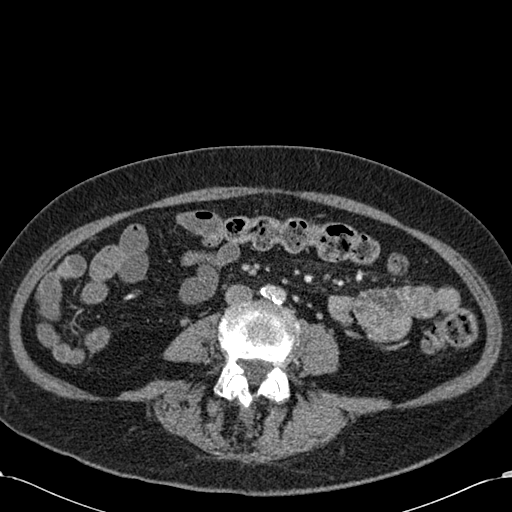
[im 154/266  soft-tissue]
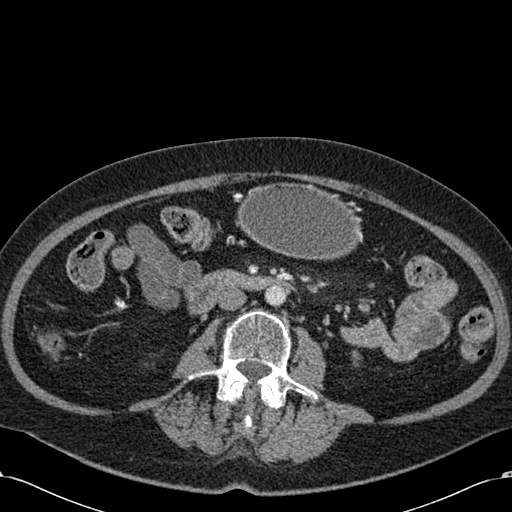
[im 168/266  soft-tissue]
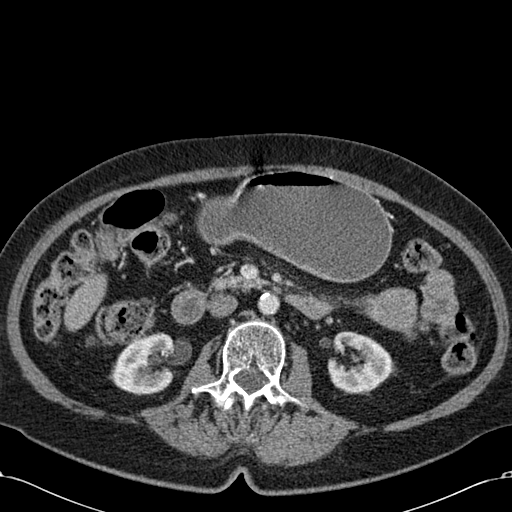
[im 168/266  bone]
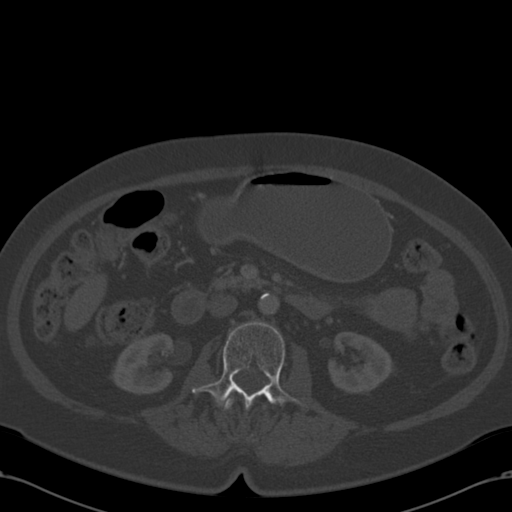
[im 196/266  soft-tissue]
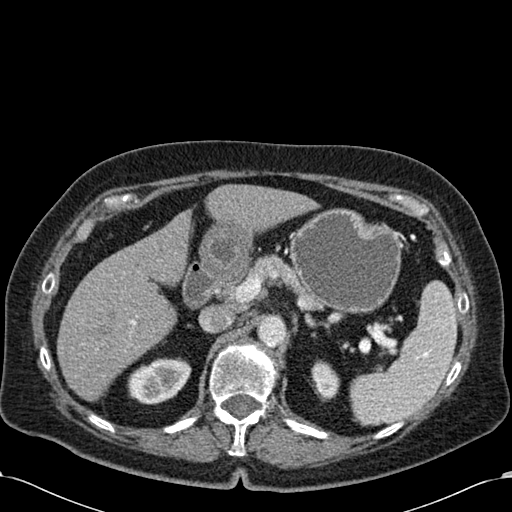
[im 210/266  soft-tissue]
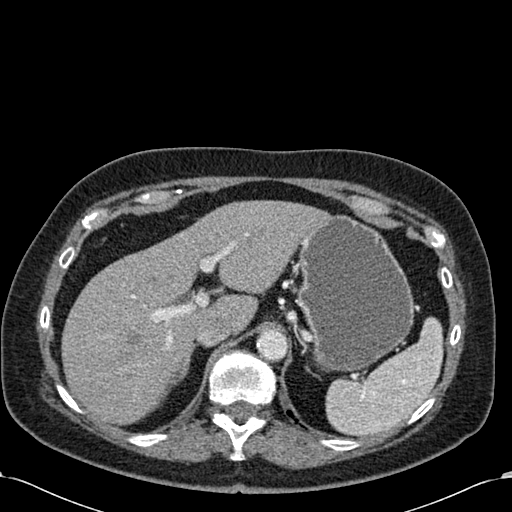
[im 224/266  soft-tissue]
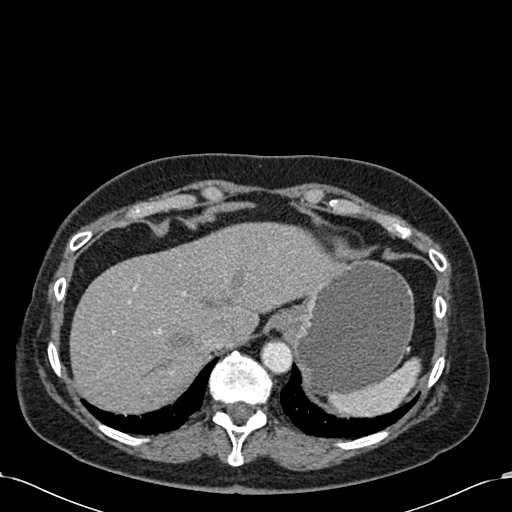
[im 252/266  soft-tissue]
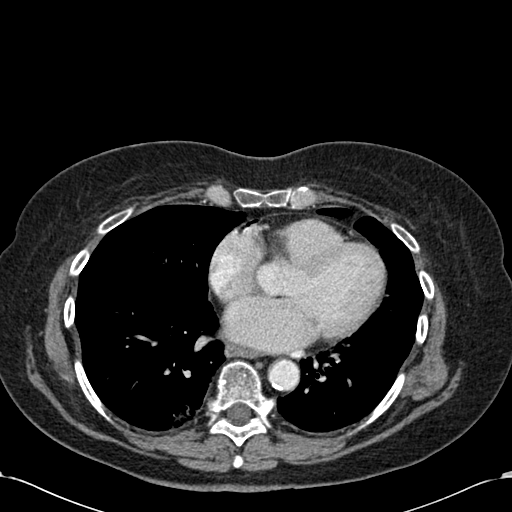

[Series 602: cor · coronal · 1.04mm/px · 3 of 108 slices shown]
[im 36/108  soft-tissue]
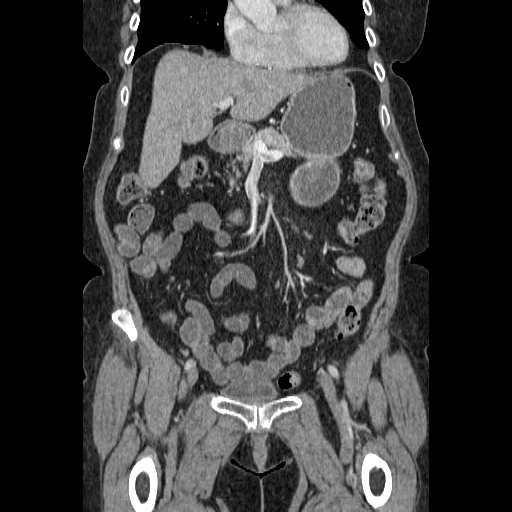
[im 48/108  soft-tissue]
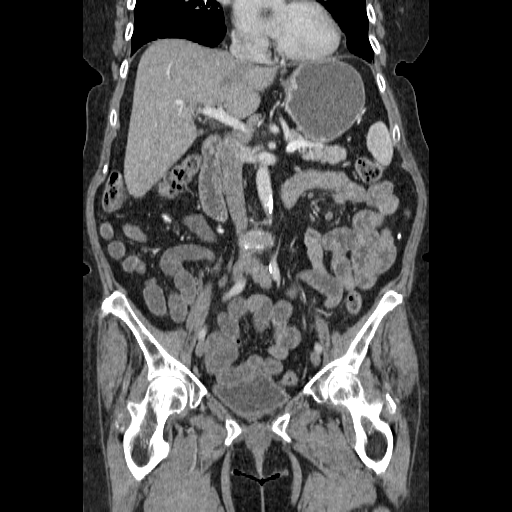
[im 60/108  soft-tissue]
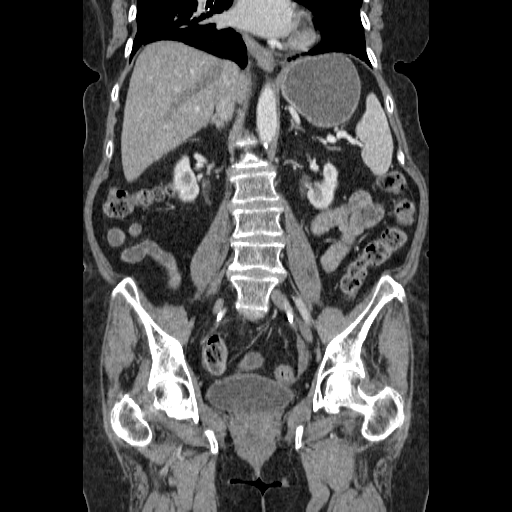

[16 of 46 positions shown; findings below may reference images not displayed]

FINDINGS: There is mild dependent right lower lobe atelectasis. A small
subpleural nodule in the left lower lobe on image 8 is stable. The
lung bases are otherwise clear. There is no significant pleural or
pericardial effusion.

The stomach and small bowel are adequately distended with contrast.
There is no bowel wall thickening or transmural enhancement. The
cecum is positioned in the right upper quadrant. The terminal ileum
and ileocecal valve appear normal. The appendix is not clearly seen.
There is no colonic wall thickening, abnormal enhancement or
surrounding inflammation. There is no significant diverticulosis.

There is aortoiliac and proximal visceral atherosclerosis. No large
vessel occlusion is identified. No venous abnormalities are seen.
There are few residual mesenteric lymph nodes, although these have
decreased in size and number compared with the prior study. There is
no retroperitoneal lymphadenopathy or extraluminal fluid collection.

Small vague foci of arterial phase enhancement within the right
hepatic lobe are noted on images 18 and 19. These are not apparent
on the prior noncontrast study. The liver otherwise appears
unremarkable. There is no evidence of cirrhosis. The gallbladder
appears surgically absent. There is no biliary dilatation. The
spleen, pancreas and adrenal glands appear normal. Both kidneys
appear normal.

There is no pelvic mass status post hysterectomy. The bladder
appears normal.

There are mild degenerative changes of the lumbar spine associated
with a convex left scoliosis. No worrisome osseous findings are
demonstrated.
IMPRESSION: 1. No evidence of inflammatory bowel disease, obstruction or
extraluminal fluid collection.
2. Interval improvement in previously noted prominent mesenteric
lymph nodes. Currently, no adenopathy identified.
3. Small foci of enhancement in the right hepatic lobe, likely small
hemangiomas or incidental vascular phenomena. These are inapparent
on prior noncontrast study.
4. Stable mild atherosclerosis.

## 2013-08-14 MED ORDER — IOHEXOL 300 MG/ML  SOLN
100.0000 mL | Freq: Once | INTRAMUSCULAR | Status: AC | PRN
  Administered 2013-08-14: 100 mL via INTRAVENOUS

## 2013-08-16 LAB — 5 HIAA, QUANTITATIVE, URINE, 24 HOUR

## 2013-08-20 ENCOUNTER — Telehealth: Payer: Self-pay | Admitting: Internal Medicine

## 2013-08-20 NOTE — Telephone Encounter (Signed)
Spoke with patient and she states she is not better since OV with Nicoletta Ba, PA. Patient states she now has a bladder infection because she was taken off the Keflex. She is upset that her f/u OV is not until 09/21/13. Rescheduled with Dr. Hilarie Fredrickson on 08/24/13 at 4:00 PM.

## 2013-08-24 ENCOUNTER — Ambulatory Visit (INDEPENDENT_AMBULATORY_CARE_PROVIDER_SITE_OTHER): Payer: Medicare Other | Admitting: Internal Medicine

## 2013-08-24 ENCOUNTER — Encounter: Payer: Self-pay | Admitting: Internal Medicine

## 2013-08-24 VITALS — BP 148/70 | HR 60 | Ht 62.0 in | Wt 171.0 lb

## 2013-08-24 DIAGNOSIS — K56600 Partial intestinal obstruction, unspecified as to cause: Secondary | ICD-10-CM

## 2013-08-24 DIAGNOSIS — K56609 Unspecified intestinal obstruction, unspecified as to partial versus complete obstruction: Secondary | ICD-10-CM

## 2013-08-24 DIAGNOSIS — I200 Unstable angina: Secondary | ICD-10-CM

## 2013-08-24 DIAGNOSIS — K529 Noninfective gastroenteritis and colitis, unspecified: Secondary | ICD-10-CM

## 2013-08-24 DIAGNOSIS — R159 Full incontinence of feces: Secondary | ICD-10-CM

## 2013-08-24 DIAGNOSIS — R197 Diarrhea, unspecified: Secondary | ICD-10-CM

## 2013-08-24 MED ORDER — BUDESONIDE 3 MG PO CP24: 9.0000 mg | ORAL_CAPSULE | Freq: Every day | ORAL | Status: DC

## 2013-08-24 MED ORDER — DICYCLOMINE HCL 10 MG PO CAPS
10.0000 mg | ORAL_CAPSULE | Freq: Three times a day (TID) | ORAL | Status: DC
Start: 2013-08-24 — End: 2013-10-09

## 2013-08-24 NOTE — Patient Instructions (Signed)
We have sent the following medications to your pharmacy for you to pick up at your convenience: Bentyl 10 mg take 1 tablet 3 times a day can increase to 20 mg if needed. Entocort 9 mg daily  Follow up with Dr. Hilarie Fredrickson in office in 6 weeks

## 2013-08-26 ENCOUNTER — Encounter: Payer: Self-pay | Admitting: Internal Medicine

## 2013-08-26 NOTE — Progress Notes (Signed)
Patient ID: [name], female   DOB: Aug 26, 1945, 68 y.o.   MRN: [id number] HPI: [name] is a 68 yo female with a past medical history of CAD with atrial fibrillation on Pradaxa, diabetes, hyperlipidemia, hypertension, IBS and chronic diarrhea who is seen in followup. She was seen recently by Telford Nab, PA-C for chronic diarrhea with more intermittent painful attacks associated with nausea and vomiting. In the past she has been worked up by several GI doctors, most recently Dr. Randa Evens during hospitalization and Dr. Ruthell Rummage on 07/26/2013 at [facility]  She reports 20+ years of daily diarrhea occurring 4-8 times daily. She reports her stools are always loose and never formed. She denies rectal bleeding or melena. With this she often has fecal incontinence. She also endorses frequent abdominal bloating with foul-smelling flatulence. She has tried multiple therapies in the past including a trial of empiric rifaximin, Lomotil, trial of prednisone, and Imodium. She states if she takes maximum dose loperamide it will work but she often has to take it 2-3 days in a row before provides real benefit. She will take this medication to 3 days before a scheduled event such as a trip to help curtail the diarrhea.  Outside daily diarrhea she describes episodes of "horrible cramps" in intense diffuse abdominal pain associated with nausea and violent vomiting. She reports the pain can be so severe that she has to lie on the bathroom floor. On these days she also has increased diarrhea often after vomiting which can be 20 stools per day.  These more intense episodes have led to several emergency room visits. On these days she is often woken from sleep.    She was started on colestipol 2 g twice daily by Dr. Sheppard Penton but this hasn't helped very much. She does think her diarrhea, at least a chronic daily part without intensive carmps has been worse since her gallbladder surgery years ago   Past Medical History  Diagnosis  Date  . Diabetes mellitus   . Hyperlipidemia   . Coronary artery disease     a.  s/p Xience DES to RCA 04/2009;   b. TEE 2/12: EF 40%, Large PFO;  c.  Lexiscan Myoview 05/2012: EF 69%, no ischemia. LHC (05/2012):  Ostial diagonal 30-40%, proximal mid ramus intermedius 40-50%, RCA stent patent with 40-50% after stent, then 40%, distal RCA 40-50%, EF 55-65%. Medical therapy continued.;  d.  Eugenie Birks Myoview (06/2013):  No ischemia, EF 83%, normal study  . Hypertension   . BCC (basal cell carcinoma of skin)     dm2  . Hx of echocardiogram     a. Echo (08/2011): EF 55-65%, grade 1 diastolic dysfunction.   . Atrial fibrillation     s/p RFCA (pulmonary vein isolation) 08/2010 (Dr. Johney Frame);  Event and Holter monitors (04/2012): No atrial fibrillation.  . Frequent UTI     on keflex for prophylaxis  . Hx of prior ablation treatment   . Stented coronary artery     october 2010  . Irritable bowel syndrome (IBS)     Past Surgical History  Procedure Laterality Date  . Total abdominal hysterectomy    . Appendectomy    . Cholecystectomy    . Spinal cyst removed    . Coronary stent placement    . Atrial fibrillation ablation      by JA 2/12  . Carpal tunnel release      rigth hand  .  on both removed    . Bunions  removed fron both feet  . Flexible sigmoidoscopy N/A 07/07/2013    Procedure: FLEXIBLE SIGMOIDOSCOPY;  Surgeon: Winfield Cunas., MD;  Location: [facility];  Service: Endoscopy;  Laterality: N/A;  unprepped    Current Outpatient Prescriptions  Medication Sig Dispense Refill  . colestipol (COLESTID) 1 G tablet Take 2 tablets (2 g total) by mouth 2 (two) times daily.      . dabigatran (PRADAXA) 150 MG CAPS capsule Take 150 mg by mouth 2 (two) times daily.      . metoprolol succinate (TOPROL-XL) 25 MG 24 hr tablet Take 1 tablet (25 mg total) by mouth 2 (two) times daily. 12 hours apart  180 tablet  3  . nitroGLYCERIN (NITROSTAT) 0.4 MG SL tablet Place 0.4 mg under the tongue  every 5 (five) minutes as needed. For chest pain      . ondansetron (ZOFRAN ODT) 4 MG disintegrating tablet Take 1 tablet (4 mg total) by mouth every 8 (eight) hours as needed for nausea or vomiting.  20 tablet  1  . budesonide (ENTOCORT EC) 3 MG 24 hr capsule Take 3 capsules (9 mg total) by mouth daily.  60 capsule  6  . dicyclomine (BENTYL) 10 MG capsule Take 1 capsule (10 mg total) by mouth 3 (three) times daily.  90 capsule  2   No current facility-administered medications for this visit.    No Known Allergies  Family History  Problem Relation Age of Onset  . Coronary artery disease Father   . Heart disease Sister   . Heart disease Mother     History  Substance Use Topics  . Smoking status: Former Smoker -- 1.00 packs/day for 20 years    Quit date: 07/02/1989  . Smokeless tobacco: Never Used  . Alcohol Use: 0.0 oz/week     Comment: wine occasionally    ROS: As per history of present illness, otherwise negative  BP 148/70  Pulse 60  Ht 5\' 2"  (1.575 m)  Wt 171 lb (77.565 kg)  BMI 31.27 kg/m2 Constitutional: Well-developed and well-nourished. No distress. HEENT: Normocephalic and atraumatic. Oropharynx is clear and moist. No oropharyngeal exudate. Conjunctivae are normal.  No scleral icterus. Neck: Neck supple. Trachea midline. Cardiovascular: Normal rate, regular rhythm and intact distal pulses.  Pulmonary/chest: Effort normal and breath sounds normal. No wheezing, rales or rhonchi. Abdominal: Soft, well-healed abdominal scar right middle abdomen nontender, nondistended. Bowel sounds active throughout. There are no masses palpable.  Extremities: no clubbing, cyanosis, or edema Lymphadenopathy: No cervical adenopathy noted. Neurological: Alert and oriented to person place and time. Skin: Skin is warm and dry. No rashes noted. Psychiatric: Normal mood and affect. Behavior is normal.  RELEVANT LABS AND IMAGING: CBC    Component Value Date/Time   WBC 13.3* 07/15/2013  0836   RBC 4.14 07/15/2013 0836   HGB 13.2 07/15/2013 0836   HCT 40.3 07/15/2013 0836   PLT 158 07/15/2013 0836   MCV 97.3 07/15/2013 0836   MCH 31.9 07/15/2013 0836   MCHC 32.8 07/15/2013 0836   RDW 13.4 07/15/2013 0836   LYMPHSABS 0.7 07/05/2013 2150   MONOABS 0.5 07/05/2013 2150   EOSABS 0.1 07/05/2013 2150   BASOSABS 0.0 07/05/2013 2150    CMP     Component Value Date/Time   NA 144 07/15/2013 0836   K 3.3* 07/15/2013 0836   CL 101 07/15/2013 0836   CO2 26 07/15/2013 0836   GLUCOSE 169* 07/15/2013 0836   BUN 17 07/15/2013 0836   CREATININE  1.33* 07/15/2013 0836   CALCIUM 9.6 07/15/2013 0836   PROT 6.6 07/15/2013 0836   ALBUMIN 4.1 07/15/2013 0836   AST 17 07/15/2013 0836   ALT 12 07/15/2013 0836   ALKPHOS 56 07/15/2013 0836   BILITOT 0.3 07/15/2013 0836   GFRNONAA 40* 07/15/2013 0836   GFRAA 46* 07/15/2013 0836   Gastrin normal 5-HIAA normal VIP pending  TTG normal GI pathogen panel negative Fecal lactoferrin negative  Left colon biopsies negative for microscopic colitis  EXAM: CT ABDOMEN AND PELVIS WITH CONTRAST (ENTEROGRAPHY) -- FEB 2015   TECHNIQUE: Multidetector CT of the abdomen and pelvis during bolus administration of intravenous contrast. Negative oral contrast VoLumen was given.   CONTRAST:  159mL OMNIPAQUE IOHEXOL 300 MG/ML  SOLN   COMPARISON:  CT ABD/PELV WO CM dated 12/07/2009; CT ANGIO CHEST W/CM &/OR WO/CM dated 04/13/2009   FINDINGS: There is mild dependent right lower lobe atelectasis. A small subpleural nodule in the left lower lobe on image 8 is stable. The lung bases are otherwise clear. There is no significant pleural or pericardial effusion.   The stomach and small bowel are adequately distended with contrast. There is no bowel wall thickening or transmural enhancement. The cecum is positioned in the right upper quadrant. The terminal ileum and ileocecal valve appear normal. The appendix is not clearly seen. There is no colonic wall thickening, abnormal  enhancement or surrounding inflammation. There is no significant diverticulosis.   There is aortoiliac and proximal visceral atherosclerosis. No large vessel occlusion is identified. No venous abnormalities are seen. There are few residual mesenteric lymph nodes, although these have decreased in size and number compared with the prior study. There is no retroperitoneal lymphadenopathy or extraluminal fluid collection.   Small vague foci of arterial phase enhancement within the right hepatic lobe are noted on images 18 and 19. These are not apparent on the prior noncontrast study. The liver otherwise appears unremarkable. There is no evidence of cirrhosis. The gallbladder appears surgically absent. There is no biliary dilatation. The spleen, pancreas and adrenal glands appear normal. Both kidneys appear normal.   There is no pelvic mass status post hysterectomy. The bladder appears normal.   There are mild degenerative changes of the lumbar spine associated with a convex left scoliosis. No worrisome osseous findings are demonstrated.   IMPRESSION: 1. No evidence of inflammatory bowel disease, obstruction or extraluminal fluid collection. 2. Interval improvement in previously noted prominent mesenteric lymph nodes. Currently, no adenopathy identified. 3. Small foci of enhancement in the right hepatic lobe, likely small hemangiomas or incidental vascular phenomena. These are inapparent on prior noncontrast study. 4. Stable mild atherosclerosis.     ASSESSMENT/PLAN: 68 yo female with a past medical history of CAD with atrial fibrillation on Pradaxa, diabetes, hyperlipidemia, hypertension, IBS and chronic diarrhea who is seen in followup.  1.  Chronic diarrhea/fecal incontinence/episdoic abd pain with nausea and vomiting -- she has had a very extensive workup to this point without identifiable pathology.  Her intense painful episodes associated with nausea and vomiting sound most  consistent with partial small bowel obstruction which resolves slowly and results in worsening diarrhea.  Her risk factor for this is previous abdominal surgery and perhaps she has an internal hernia or adhesive band which is causing transient obstructions.  The CT enterography did not show evidence for this though it is quite possible it would not be seen unless it is actively a problem.  Outside of this, her chronic diarrhea is a long-standing issue. She  has not benefited from bile acids sequestrant.  Awaiting VIP, though this is unlikely.  I going to give her an empiric trial of budesonide for microscopic colitis. Her left colon biopsies were negative for microscopic colitis, though there have been cases in the literature to suggest right colon biopsies are necessary to completely exclude microscopic colitis. With this in mind she will take 9 mg budesonide daily x8 weeks. I am also going to schedule Bentyl 10 mg 3 times daily to try to help with diarrhea and also fecal incontinence. She can increase this to 20 mg 3 times daily if needed. I would like to see her back in 6 weeks, sooner if necessary. She may eventually need a surgical consultation to consider laparoscopy to evaluate for adhesive disease.  Also in the future, we will consider anorectal manometry given her fecal incontinence (assuming it fails to improve.

## 2013-08-28 ENCOUNTER — Other Ambulatory Visit: Payer: Self-pay | Admitting: Internal Medicine

## 2013-09-02 ENCOUNTER — Ambulatory Visit: Payer: Medicare Other | Admitting: Internal Medicine

## 2013-09-21 ENCOUNTER — Ambulatory Visit: Payer: Medicare Other | Admitting: Internal Medicine

## 2013-10-05 ENCOUNTER — Encounter: Payer: Self-pay | Admitting: Internal Medicine

## 2013-10-07 ENCOUNTER — Encounter: Payer: Self-pay | Admitting: Physician Assistant

## 2013-10-07 ENCOUNTER — Ambulatory Visit (INDEPENDENT_AMBULATORY_CARE_PROVIDER_SITE_OTHER): Payer: Medicare Other | Admitting: Physician Assistant

## 2013-10-07 VITALS — BP 138/82 | HR 67 | Ht 62.0 in | Wt 172.0 lb

## 2013-10-07 DIAGNOSIS — I2510 Atherosclerotic heart disease of native coronary artery without angina pectoris: Secondary | ICD-10-CM

## 2013-10-07 DIAGNOSIS — I200 Unstable angina: Secondary | ICD-10-CM

## 2013-10-07 DIAGNOSIS — E785 Hyperlipidemia, unspecified: Secondary | ICD-10-CM

## 2013-10-07 DIAGNOSIS — I4891 Unspecified atrial fibrillation: Secondary | ICD-10-CM

## 2013-10-07 DIAGNOSIS — I10 Essential (primary) hypertension: Secondary | ICD-10-CM

## 2013-10-07 NOTE — Patient Instructions (Signed)
Continue your current medications. Please start taking Crestor again once your stomach issues are cleared up. Your physician recommends that you schedule a follow-up appointment in: 4 months with Dr. Thompson Grayer.

## 2013-10-07 NOTE — Progress Notes (Signed)
[place], [place],   [address] Phone: [phone] Fax:  [phone]  Date:  10/07/2013   ID:  [name], DOB 1946-02-05, MRN [id number]  PCP:  [name], MD  Cardiologist:  Dr. Thompson Grayer     History of Present Illness: [name] is a 68 y.o. female with a hx of CAD, status post DES to the RCA 04/2009, paroxysmal atrial fibrillation, status post PVI ablation 08/2010, T2DM, HTN, HL. Last seen by Dr. Rayann Heman in 08/2013.  She is doing well. She denies chest pain, significant dyspnea, orthopnea, PND or edema. She denies palpitations. She continues to have problems with nausea, vomiting and diarrhea. She has follow up with gastroenterology later this week. She remains off of Crestor.   Studies:  - Echo (08/2011): EF 42-35%, grade 1 diastolic dysfunction.    - Event and Holter monitors (04/2012): No atrial fibrillation.   - Event Monitor (06/2013):  NSR, No AFib.   - Lexiscan Myoview 05/2012: EF 69%, no ischemia.   - Myoview (06/2013):  No ischemia, EF 83%.  - LHC (05/2012):  Ostial Dx 30-40%, proximal mid RI 40-50%, RCA stent patent with 40-50% after stent, then 40%, distal RCA 40-50%, EF 55-65%. Medical Rx    Recent Labs: 05/27/2013: Pro B Natriuretic peptide (BNP) 39.0  07/06/2013: TSH 2.657  07/15/2013: ALT 12; Creatinine 1.33*; Hemoglobin 13.2; Potassium 3.3*   Wt Readings from Last 3 Encounters:  10/07/13 172 lb (78.019 kg)  08/24/13 171 lb (77.565 kg)  08/10/13 166 lb (75.297 kg)     Past Medical History  Diagnosis Date  . Diabetes mellitus   . Hyperlipidemia   . Coronary artery disease     a.  s/p Xience DES to RCA 04/2009;   b. TEE 2/12: EF 40%, Large PFO;  c.  Lexiscan Myoview 05/2012: EF 69%, no ischemia. LHC (05/2012):  Ostial diagonal 30-40%, proximal mid ramus intermedius 40-50%, RCA stent patent with 40-50% after stent, then 40%, distal RCA 40-50%, EF 55-65%. Medical therapy continued.;  d.  Carlton Adam Myoview (06/2013):  No ischemia,  EF 83%, normal study  . Hypertension   . BCC (basal cell carcinoma of skin)     dm2  . Hx of echocardiogram     a. Echo (08/2011): EF 36-14%, grade 1 diastolic dysfunction.   . Atrial fibrillation     s/p RFCA (pulmonary vein isolation) 08/2010 (Dr. Rayann Heman);  Event and Holter monitors (04/2012): No atrial fibrillation.  . Frequent UTI     on keflex for prophylaxis  . Hx of prior ablation treatment   . Stented coronary artery     october 2010  . Irritable bowel syndrome (IBS)     Current Outpatient Prescriptions  Medication Sig Dispense Refill  . budesonide (ENTOCORT EC) 3 MG 24 hr capsule Take 3 capsules (9 mg total) by mouth daily.  60 capsule  6  . colestipol (COLESTID) 1 G tablet Take 2 tablets (2 g total) by mouth 2 (two) times daily.      . dabigatran (PRADAXA) 150 MG CAPS capsule Take 150 mg by mouth 2 (two) times daily.      [name] dicyclomine (BENTYL) 10 MG capsule Take 1 capsule (10 mg total) by mouth 3 (three) times daily.  90 capsule  2  . metoprolol succinate (TOPROL-XL) 25 MG 24 hr tablet Take 1 tablet (25 mg total) by mouth 2 (two) times daily. 12 hours apart  180 tablet  3  . nitroGLYCERIN (NITROSTAT) 0.4 MG  SL tablet Place 0.4 mg under the tongue every 5 (five) minutes as needed. For chest pain      . ondansetron (ZOFRAN ODT) 4 MG disintegrating tablet Take 1 tablet (4 mg total) by mouth every 8 (eight) hours as needed for nausea or vomiting.  20 tablet  1  . PRADAXA 150 MG CAPS capsule TAKE 1 CAPSULE EVERY 12 HOURS  3 capsule  2   No current facility-administered medications for this visit.    Allergies:   Review of patient's allergies indicates no known allergies.   Social History:  The patient  reports that she quit smoking about 24 years ago. She has never used smokeless tobacco. She reports that she drinks alcohol. She reports that she does not use illicit drugs.   Family History:  The patient's family history includes Coronary artery disease in her father; Heart  disease in her mother and sister.   ROS:  Please see the history of present illness.      All other systems reviewed and negative.   PHYSICAL EXAM: VS:  BP 138/82  Pulse 67  Ht 5\' 2"  (1.575 m)  Wt 172 lb (78.019 kg)  BMI 31.45 kg/m2 Well nourished, well developed, in no acute distress HEENT: normal Neck: no JVD Cardiac:  normal S1, S2; RRR; no murmur Lungs:  Clear to auscultation, no wheezing, no rales Abd: soft, nontender, no hepatomegaly Ext: no edema Skin: warm and dry Neuro:  CNs 2-12 intact, no focal abnormalities noted  EKG:  NSR, HR 67, normal axis, no ST changes     ASSESSMENT AND PLAN:  1. CAD:  No angina. She is not on aspirin as she is on Pradaxa. She is currently off statin therapy due to ongoing GI issues. 2. Atrial Fibrillation: maintaining NSR. Continue Pradaxa for stroke prophylaxis. Continue beta blocker. 3. GERD:  Continue followup with gastroenterology as planned. 4. Hypertension:  Controlled. 5. Hyperlipidemia:  resume statin when able. 6. Disposition: Follow up with Dr. Rayann Heman in 01/2014 as planned.  Signed, Richardson Dopp, PA-C  10/07/2013 3:33 PM

## 2013-10-09 ENCOUNTER — Ambulatory Visit: Payer: Medicare Other | Admitting: Internal Medicine

## 2013-10-09 ENCOUNTER — Encounter: Payer: Self-pay | Admitting: Internal Medicine

## 2013-10-09 VITALS — BP 126/82 | HR 72 | Ht 62.0 in | Wt 171.2 lb

## 2013-10-09 DIAGNOSIS — R195 Other fecal abnormalities: Secondary | ICD-10-CM

## 2013-10-09 DIAGNOSIS — R151 Fecal smearing: Secondary | ICD-10-CM

## 2013-10-09 DIAGNOSIS — R109 Unspecified abdominal pain: Secondary | ICD-10-CM

## 2013-10-09 MED ORDER — DICYCLOMINE HCL 10 MG PO CAPS: 10.0000 mg | ORAL_CAPSULE | Freq: Three times a day (TID) | ORAL | Status: DC

## 2013-10-09 NOTE — Patient Instructions (Signed)
We have sent the following medications to your pharmacy for you to pick up at your convenience: bentyl 10 mg daily [facility].  Discontinue taking Entecort  Follow up with Dr. Hilarie Fredrickson in 6 months

## 2013-10-11 NOTE — Progress Notes (Signed)
   Subjective:    Patient ID: Beaulah Corin, female    DOB: September 18, 1945, 68 y.o.   MRN: [id number]  HPI Mrs. Sivak is a 68 yo female with a past medical history of CAD with atrial fibrillation on Pradaxa, diabetes, hyperlipidemia, hypertension, IBS and chronic diarrhea who is seen in followup. She was seen approximately 6 weeks ago for chronic diarrhea with intermittent painful attacks of abdominal pain with nausea and vomiting. She was empirically tried on budesonide for microscopic colitis. She was also started on scheduled Bentyl 20 mg 3 times daily. No significant change in diarrhea with the budesonide, but she has much less fecal urgency and incontinence. She is also having much less fecal seepage. He has not had any further episodes of severe abdominal pain with nausea and vomiting. No new fevers or chills. No hepatobiliary complaints. No nausea or vomiting. Good appetite. Stable weight. She is having 2-4 bowel movements daily and while they can be loose she feels like she has more control.  She reports her last colonoscopy was November 2013 which was reportedly normal and she denies a history of colon polyps  Review of Systems As per history of present illness otherwise negative  Current Medications, Allergies, Past Medical History, Past Surgical History, Family History and Social History were reviewed in [employer] record.     Objective:   Physical Exam BP 126/82  Pulse 72  Ht 5\' 2"  (1.575 m)  Wt 171 lb 3.2 oz (77.656 kg)  BMI 31.31 kg/m2  SpO2 98% Constitutional: Well-developed and well-nourished. No distress. HEENT: Normocephalic and atraumatic. Conjunctivae are normal.  No scleral icterus. Cardiovascular: Normal rate, regular rhythm and intact distal pulses. No M/R/G Pulmonary/chest: Effort normal and breath sounds normal. No wheezing, rales or rhonchi. Abdominal: Soft, nontender, nondistended. Bowel sounds active throughout.  Extremities: no clubbing,  cyanosis, or edema Neurological: Alert and oriented to person place and time. Psychiatric: Normal mood and affect. Behavior is normal  CT abd from Feb 2015 again reviewed     Assessment & Plan:   68 yo female with a past medical history of CAD with atrial fibrillation on Pradaxa, diabetes, hyperlipidemia, hypertension, IBS and chronic diarrhea who is seen in followup.  1.  Chronic loose stools with fecal urgency -- she has had improvement in her loose stools as well as fecal urgency and fecal seepage likely as a result of Bentyl. We will continue Bentyl 20 mg 3 times daily for the anticholinergic properties and to help with the symptoms. I do not think budesonide made any meaningful change in his medicine is being stopped. There is no evidence for microscopic colitis. As that she notify me if symptoms worsen again after stopping budesonide. She voices understanding. Given improvement we will not proceed to a rectal manometry at this point, but can if symptoms worsen  2.  Episodic severe abd pain with n/v -- no recent attacks, but my suspicion is for transient partial small bowel obstructions. Her risk factor is previous abdominal surgery. Laparoscopy would likely be required if symptoms continue or become more frequent. I asked her to notify me should this occurs.

## 2013-11-12 ENCOUNTER — Telehealth: Payer: Self-pay | Admitting: Internal Medicine

## 2013-11-12 MED ORDER — DICYCLOMINE HCL 20 MG PO TABS: 20.0000 mg | ORAL_TABLET | Freq: Three times a day (TID) | ORAL | Status: DC

## 2013-11-12 NOTE — Telephone Encounter (Signed)
Patient called to report she is still having problems with fecal incontinence.  She is not able to leave the house.  She reports that she is taking dicyclomine 10 mg TID (according to the last note it was to be 20 mg TID.  Wrong rx was sent in).  I have discussed with her proceeding to rectal manometry as listed in your office note from April.  She reports that she has already had this at [facility].  She is asked to double up her dicyclomine at home to 20 mg TID. She will come in and see Dr. Hilarie Fredrickson on 11/16/13 at 2:45

## 2013-11-16 ENCOUNTER — Encounter: Payer: Self-pay | Admitting: Internal Medicine

## 2013-11-16 ENCOUNTER — Ambulatory Visit: Payer: Medicare Other | Admitting: Internal Medicine

## 2013-11-16 VITALS — BP 110/62 | HR 64 | Ht 62.0 in | Wt 179.8 lb

## 2013-11-16 DIAGNOSIS — R159 Full incontinence of feces: Secondary | ICD-10-CM

## 2013-11-16 DIAGNOSIS — K529 Noninfective gastroenteritis and colitis, unspecified: Secondary | ICD-10-CM

## 2013-11-16 DIAGNOSIS — K589 Irritable bowel syndrome without diarrhea: Secondary | ICD-10-CM

## 2013-11-16 NOTE — Patient Instructions (Signed)
You have been referred to Dr. Leighton Ruff at [facility]; they will contact you to schedule that appointment. If you do not hear from them by the end of the week please call our office.   Continue bentyl

## 2013-11-16 NOTE — Progress Notes (Signed)
   Subjective:    Patient ID: [name], female    DOB: 07-04-1945, 68 y.o.   MRN: [id number]  HPI [name] is a 68 year old female with a past medical history of CAD with atrial fibrillation on Pradaxa, diabetes, hyperlipidemia, hypertension, and IBS with chronic diarrhea who is seen for followup. She was last seen on 10/11/2013 at which time she was feeling somewhat better. She has not had any further attacks of severe abdominal pain associated with nausea and vomiting. Also at that last visit her diarrhea was somewhat improved though she had no response to budesonide therapy. She was having less fecal seepage at that time. Today she reports the fecal seepage seems to have increased and this is despite Bentyl 10 mg 3 times daily. Of note she was taking Bentyl 20 mg 3 times daily before per the record. She is bothered by her fecal seepage and would like something done if possible. She is not having leakage of solid stool and when she has a bowel movement her stools are formed but in between bowel movements she can have spontaneous fecal seepage which she is unaware of until she visits the bathroom. No blood in her stool or melena. No rectal pain. Good appetite. No nausea or vomiting. No fevers or chills  Last colonoscopy November 2013 -- reportedly normal no history of colon polyps  Review of Systems As per history of present illness, otherwise negative  Current Medications, Allergies, Past Medical History, Past Surgical History, Family History and Social History were reviewed in [employer] record.     Objective:   Physical Exam BP 110/62  Pulse 64  Ht 5\' 2"  (1.575 m)  Wt 179 lb 12.8 oz (81.557 kg)  BMI 32.88 kg/m2 Constitutional: Well-developed and well-nourished. No distress. Psychiatric: Normal mood and affect. Behavior is normal.     Assessment & Plan:  68 yo female with a past medical history of CAD with atrial fibrillation on Pradaxa, diabetes,  hyperlipidemia, hypertension, IBS and chronic diarrhea and fecal seepage who is seen in followup.  1.  Fecal seepage/soiling -- 3 days ago we did increase her Bentyl to 20 mg 3 times daily to try to help with fecal seepage. I also would like her to back Benefiber as a fiber supplement to try to bulk her stool. I am referring her to Dr. Leighton Ruff for anorectal manometry with balloon expulsion testing and to consider Solesta injections for fecal seepage.  Multiple medical therapies have been tried without complete success so hopefully she can benefit further with Dr. Marcello Moores.  2.  Episodic severe abd pain with N/V -- no episodes recently. Suspicion is for transient partial small bowel obstructions perhaps due to adhesive disease. Given that this is not a problem recently, nothing further at this time. I asked that she notify me if she has further attacks.  She voices understanding.

## 2013-11-16 NOTE — Telephone Encounter (Signed)
We will contact [facility] for manometry results.  They are not available in care everywhere.  Will discuss with patient when she is in the office

## 2013-11-16 NOTE — Telephone Encounter (Signed)
Okay to increase dicyclomine to 20 mg 3 times daily Need anorectal manometry records from [facility] Can discuss at followup today

## 2013-12-08 ENCOUNTER — Ambulatory Visit (INDEPENDENT_AMBULATORY_CARE_PROVIDER_SITE_OTHER): Payer: Medicare Other | Admitting: General Surgery

## 2013-12-14 ENCOUNTER — Ambulatory Visit (INDEPENDENT_AMBULATORY_CARE_PROVIDER_SITE_OTHER): Payer: Medicare Other | Admitting: General Surgery

## 2013-12-14 ENCOUNTER — Encounter (INDEPENDENT_AMBULATORY_CARE_PROVIDER_SITE_OTHER): Payer: Self-pay | Admitting: General Surgery

## 2013-12-14 VITALS — BP 126/72 | HR 73 | Temp 97.8°F | Ht 63.0 in | Wt 183.0 lb

## 2013-12-14 DIAGNOSIS — R159 Full incontinence of feces: Secondary | ICD-10-CM

## 2013-12-14 MED ORDER — LOPERAMIDE HCL 2 MG PO TABS: 8.0000 mg | ORAL_TABLET | Freq: Every day | ORAL | Status: DC

## 2013-12-14 NOTE — Progress Notes (Signed)
Chief Complaint  Patient presents with  . eval fecal incontinence    HISTORY: [name] is a 68 y.o. female who presents to the office with fecal incontinence.  This had been occurring for many years.   Nothing makes the symptoms worse.   It is continuous in nature and occurs daily.  her bowel habits are regular and her bowel movements are more loose in nature.  her fiber intake is dietary.  her last colonoscopy was in Nov 2013.  She reports that 2 polyps were found. Random biopsies showed no colitis.  she has had 2 vaginal deliveries, She reports "having a lot of stiches" with both deliveries.  she is incontinent to liquid stool but not solid stools or gas.  She has tried multiple fiber supplements in the past for several months at a time with no change in her bowel habits.  She is taking imodium when she has to leave the house for long periods of time.  She has to take 12-16mg  for effect.    Past Medical History  Diagnosis Date  . Diabetes mellitus   . Hyperlipidemia   . Coronary artery disease     a.  s/p Xience DES to RCA 04/2009;   b. TEE 2/12: EF 40%, Large PFO;  c.  Lexiscan Myoview 05/2012: EF 69%, no ischemia. LHC (05/2012):  Ostial diagonal 30-40%, proximal mid ramus intermedius 40-50%, RCA stent patent with 40-50% after stent, then 40%, distal RCA 40-50%, EF 55-65%. Medical therapy continued.;  d.  Carlton Adam Myoview (06/2013):  No ischemia, EF 83%, normal study  . Hypertension   . BCC (basal cell carcinoma of skin)     dm2  . Hx of echocardiogram     a. Echo (08/2011): EF 22-48%, grade 1 diastolic dysfunction.   . Atrial fibrillation     s/p RFCA (pulmonary vein isolation) 08/2010 (Dr. Rayann Heman);  Event and Holter monitors (04/2012): No atrial fibrillation.  . Frequent UTI     on keflex for prophylaxis  . Hx of prior ablation treatment   . Stented coronary artery     october 2010  . Irritable bowel syndrome (IBS)       Past Surgical History  Procedure Laterality Date  . Total  abdominal hysterectomy    . Appendectomy    . Cholecystectomy    . Spinal cyst removed    . Coronary stent placement    . Atrial fibrillation ablation      by JA 2/12  . Carpal tunnel release      rigth hand  .  on both removed    . Bunions      removed fron both feet  . Flexible sigmoidoscopy N/A 07/07/2013    Procedure: FLEXIBLE SIGMOIDOSCOPY;  Surgeon: Winfield Cunas., MD;  Location: [facility];  Service: Endoscopy;  Laterality: N/A;  unprepped        Current Outpatient Prescriptions  Medication Sig Dispense Refill  . dabigatran (PRADAXA) 150 MG CAPS capsule Take 150 mg by mouth 2 (two) times daily.      . metoprolol succinate (TOPROL-XL) 25 MG 24 hr tablet Take 25 mg by mouth daily.      . rosuvastatin (CRESTOR) 40 MG tablet Take 40 mg by mouth daily.      . traMADol (ULTRAM) 50 MG tablet Take by mouth every 6 (six) hours as needed.      . traZODone (DESYREL) 50 MG tablet Take 50 mg by mouth at bedtime.      [name]  loperamide (IMODIUM A-D) 2 MG tablet Take 4-6 tablets (8-12 mg total) by mouth daily.  180 tablet  2   No current facility-administered medications for this visit.      No Known Allergies    Family History  Problem Relation Age of Onset  . Coronary artery disease Father   . Heart disease Sister   . Heart disease Mother     History   Social History  . Marital Status: Married    Spouse Name: N/A    Number of Children: N/A  . Years of Education: N/A   Occupational History  . retired    Social History Main Topics  . Smoking status: Former Smoker -- 1.00 packs/day for 20 years    Quit date: 07/02/1989  . Smokeless tobacco: Never Used  . Alcohol Use: 0.0 oz/week     Comment: wine occasionally  . Drug Use: No  . Sexual Activity: None   Other Topics Concern  . None   Social History Narrative  . None      REVIEW OF SYSTEMS - PERTINENT POSITIVES ONLY: Review of Systems - General ROS: negative for - chills, fever or weight loss Hematological and  Lymphatic ROS: negative for - bleeding problems, blood clots or bruising Respiratory ROS: no cough, shortness of breath, or wheezing Cardiovascular ROS: no chest pain or dyspnea on exertion Gastrointestinal ROS: no abdominal pain, change in bowel habits, or black or bloody stools Genito-Urinary ROS: no dysuria, trouble voiding, or hematuria  EXAM: Filed Vitals:   12/14/13 1122  BP: 126/72  Pulse: 73  Temp: 97.8 F (36.6 C)    General appearance: alert and cooperative Resp: clear to auscultation bilaterally Cardio: regular rate and rhythm GI: normal findings: soft, non-tender   Procedure: Anoscopy Surgeon: Marcello Moores Diagnosis: fecal incontinence  Assistant: Cherylann Parr After the risks and benefits were explained, verbal consent was obtained for above procedure  Anesthesia: none Findings: moderate hemorrhoidal cushions, palpable short sphincter complex circumferentially, hard stool in rectal vault, low resting tone, good squeeze    ASSESSMENT AND PLAN: [name] is a 68 y.o. F with long standing fecal incontinence that occurs on a daily basis as well as chronic diarrhea. She has tried multiple fiber supplements, prescription treatments and prn anti motility medications with no long lasting relief. We discussed treatment options including daily use of imodium, solesta injections and sacral nerve stimulation. She declined the injections due to cost. She would like to try using imodium daily for now, and I have written her a script for this. I will also set her up for anal [place] and manometry to objectively measure her sphincter complex. I think she would be a good candidate for sacral nerve stimulation if the daily imodium does not work.      Rosario Adie, MD Colon and Rectal Surgery / [facility]      Visit Diagnoses: 1. Fecal incontinence     Primary Care Physician: [name], MD

## 2013-12-14 NOTE — Patient Instructions (Signed)
Use the bowel diary sheets to record your events over the next 2 weeks.  You can either drop these off at the office or return them to [place] at your next visit.   Bowel Incontinence  What is incontinence? Incontinence is the impaired ability to control gas or stool. Its severity ranges from mild difficulty with gas control to severe loss of control over liquid and formed stools. Incontinence to stool is a common problem, but often it is not discussed due to embarassment. What causes incontinence? There are many causes of incontinence. Injury during childbirth is one of the most common causes. These injuries may cause a tear in the anal muscles. The nerves supplying the anal muscles may also be injured. While some injuries may be recognized immediately following childbirth, many others may go unnoticed and not become a problem until later in life. In these situations, a prior childbirth may not be recognized as the cause of incontinence. Anal operations or traumatic injury to the tissue surrounding the anal region similarly can damage the anal muscles and hinder bowel control. Some individuals experience loss of strength in the anal muscles as they age. As a result, a minor control problem in a younger person may become more significant later in life. Diarrhea may be associated with a feeling of urgency or stool leakage due to the frequent liquid stools passing through the anal opening. If bleeding accompanies your bowel movements or you have lack of bowel control, consult your physician. These symptoms may indicate inflammation within the colon (colitis), a rectal tumor or rectal prolapse - all conditions that require prompt evaluation by a physician.    How is the cause of incontinence determined? An initial discussion of the problem with your physician will help establish the degree of control difficulty and its impact on your lifestyle. Many clues to the origin of incontinence may be found in patient  histories. For example, a woman's history of past childbirths is very important. Multiple pregnancies, large weight babies, forceps deliveries, or episiotomies may contribute to muscle or nerve injury at the time of childbirth. In some cases, medical illnesses and medications play a role in problems with control. A physical exam of the anal region should be performed. It may readily identify an obvious injury to the anal muscles. In addition, an ultrasound probe can be used within the anal area to provide a picture of the muscles and show areas in which the anal muscles have been injured. Frequently, additional studies are required to define the anal area more completely. In a test called anal manometry, a small catheter is placed into the anus to record pressure as patients relax and tighten the anal muscles. This test can demonstrate how weak or strong the muscle really is. A separate test may also be conducted to determine if the nerves that go to the anal muscles are functioning properly. What can be done to correct the problem? Treatment of incontinence may include: [name]     Dietary changes .     Constipating medications .     Muscle strengthening exercises .     Biofeedback  Injectable bulking agents  Surgical muscle repair  Artificial anal sphincter  Sacral nerve stimulator  After a careful history, physical examination and testing to determine the cause and severity of the problem, treatment can be addressed. Mild problems may be treated very simply with dietary changes and the use of some constipating medications. Diseases which cause inflammation in the rectum, such as colitis,  may contribute to anal control problems. Treating these diseases also may eliminate or improve symptoms of incontinence. Sometimes a change in prescribed medications may help. Your physician also may recommend simple home exercises that may strengthen the anal muscles to help in mild cases. A type of physical therapy  called biofeedback can be used to help patients sense when stool is ready to be evacuated and help strengthen the muscles. Injuries to the anal muscles may be repaired with surgery. Some individuals may benefit from a technique that delivers electrical energy to the skin and muscles surrounding the anus which results in firming and thickening of this area to help with continence. In certain individuals that have nerve damage or anal muscles that are damaged beyond repair, an artificial sphincter may be implanted. The artificial sphincter is a plastic, fluid filled doughnut that is surgically implanted around the damaged anal sphincter. This artificial sphincter keeps the anal canal closed. When an individual wants to have a bowel movement, the fluid can be pumped out of the doughnut to allow the anal canal to open. In extreme cases, patients may find that a colostomy is the best option for improving their quality of life. What is a colon and rectal surgeon? Colon and rectal surgeons are experts in the surgical and non-surgical treatment of diseases of the colon, rectum and anus. They have completed advanced surgical training in the treatment of these diseases as well as full general surgical training. Board-certified colon and rectal surgeons complete residencies in general surgery and colon and rectal surgery, and pass intensive examinations conducted by the American Board of Surgery and the American Board of Colon and Rectal Surgery. They are well-versed in the treatment of both benign and malignant diseases of the colon, rectum and anus and are able to perform routine screening examinations and surgically treat conditions if indicated to do so.  2012 American Society of Colon & Rectal Surgeons

## 2013-12-23 ENCOUNTER — Ambulatory Visit (INDEPENDENT_AMBULATORY_CARE_PROVIDER_SITE_OTHER): Payer: Medicare Other | Admitting: General Surgery

## 2014-01-11 ENCOUNTER — Encounter (HOSPITAL_COMMUNITY): Payer: Self-pay | Admitting: *Deleted

## 2014-01-11 ENCOUNTER — Encounter (HOSPITAL_COMMUNITY)
Admission: RE | Disposition: A | Discharge status: Home / Self Care | Payer: Self-pay | Source: Ambulatory Visit | Attending: General Surgery

## 2014-01-11 ENCOUNTER — Ambulatory Visit (HOSPITAL_COMMUNITY)
Admission: RE | Admit: 2014-01-11 | Discharge: 2014-01-11 | Disposition: A | Discharge status: Home / Self Care | Payer: Medicare Other | Source: Ambulatory Visit | Attending: General Surgery | Admitting: General Surgery

## 2014-01-11 DIAGNOSIS — Z79899 Other long term (current) drug therapy: Secondary | ICD-10-CM | POA: Insufficient documentation

## 2014-01-11 DIAGNOSIS — Z87891 Personal history of nicotine dependence: Secondary | ICD-10-CM | POA: Insufficient documentation

## 2014-01-11 DIAGNOSIS — R159 Full incontinence of feces: Secondary | ICD-10-CM | POA: Insufficient documentation

## 2014-01-11 DIAGNOSIS — R197 Diarrhea, unspecified: Secondary | ICD-10-CM | POA: Insufficient documentation

## 2014-01-11 DIAGNOSIS — E785 Hyperlipidemia, unspecified: Secondary | ICD-10-CM | POA: Insufficient documentation

## 2014-01-11 DIAGNOSIS — Z85828 Personal history of other malignant neoplasm of skin: Secondary | ICD-10-CM | POA: Insufficient documentation

## 2014-01-11 HISTORY — PX: ANAL RECTAL MANOMETRY: SHX6358

## 2014-01-11 HISTORY — PX: RECTAL ULTRASOUND: SHX2306

## 2014-01-11 SURGERY — US RECTUM

## 2014-01-11 MED ORDER — FLEET ENEMA 7-19 GM/118ML RE ENEM
1.0000 | ENEMA | Freq: Once | RECTAL | Status: AC
  Administered 2014-01-11: 1 via RECTAL

## 2014-01-11 MED ORDER — FLEET ENEMA 7-19 GM/118ML RE ENEM
ENEMA | RECTAL | Status: AC
  Filled 2014-01-11: qty 1

## 2014-01-11 NOTE — Op Note (Signed)
01/11/2014  11:09 AM  PATIENT:  [name]  69 y.o. female  Patient Care Team: [name], MD as PCP - General (Family Medicine)  PRE-OPERATIVE DIAGNOSIS:  fecal incontinance  POST-OPERATIVE DIAGNOSIS:  Fecal incontinence  PROCEDURE:  ANAL ULTRASOUND  SURGEON:  Surgeon(s): Leighton Ruff, MD  ANESTHESIA:   none  EBL: none  INDICATION: This is a 68 y.o. F with fecal incontinence to loose stools.    OR FINDINGS: external sphincter appears intact.  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  A timeout was performed indicating the correct patient and procedure.  I began by performing a digital rectal exam.  There were no abnormal masses.   The US probe was inserted without difficulty.  The levators were identified and the probe was withdrawn slowly.  The internal sphincter was identified and followed distally.   There was a break that widened to ~180 degrees distally in the internal sphincter.  The external sphincter was also identified and followed distally. There was some thinning noted distally, but the muscle appeared intact.  The patient tolerated the procedure well.

## 2014-01-11 NOTE — H&P (View-Only) (Signed)
Chief Complaint  Patient presents with  . eval fecal incontinence    HISTORY: [name] is a 68 y.o. female who presents to the office with fecal incontinence.  This had been occurring for many years.   Nothing makes the symptoms worse.   It is continuous in nature and occurs daily.  her bowel habits are regular and her bowel movements are more loose in nature.  her fiber intake is dietary.  her last colonoscopy was in Nov 2013.  She reports that 2 polyps were found. Random biopsies showed no colitis.  she has had 2 vaginal deliveries, She reports "having a lot of stiches" with both deliveries.  she is incontinent to liquid stool but not solid stools or gas.  She has tried multiple fiber supplements in the past for several months at a time with no change in her bowel habits.  She is taking imodium when she has to leave the house for long periods of time.  She has to take 12-16mg  for effect.    Past Medical History  Diagnosis Date  . Diabetes mellitus   . Hyperlipidemia   . Coronary artery disease     a.  s/p Xience DES to RCA 04/2009;   b. TEE 2/12: EF 40%, Large PFO;  c.  Lexiscan Myoview 05/2012: EF 69%, no ischemia. LHC (05/2012):  Ostial diagonal 30-40%, proximal mid ramus intermedius 40-50%, RCA stent patent with 40-50% after stent, then 40%, distal RCA 40-50%, EF 55-65%. Medical therapy continued.;  d.  Carlton Adam Myoview (06/2013):  No ischemia, EF 83%, normal study  . Hypertension   . BCC (basal cell carcinoma of skin)     dm2  . Hx of echocardiogram     a. Echo (08/2011): EF 75-17%, grade 1 diastolic dysfunction.   . Atrial fibrillation     s/p RFCA (pulmonary vein isolation) 08/2010 (Dr. Rayann Heman);  Event and Holter monitors (04/2012): No atrial fibrillation.  . Frequent UTI     on keflex for prophylaxis  . Hx of prior ablation treatment   . Stented coronary artery     october 2010  . Irritable bowel syndrome (IBS)       Past Surgical History  Procedure Laterality Date  . Total  abdominal hysterectomy    . Appendectomy    . Cholecystectomy    . Spinal cyst removed    . Coronary stent placement    . Atrial fibrillation ablation      by JA 2/12  . Carpal tunnel release      rigth hand  .  on both removed    . Bunions      removed fron both feet  . Flexible sigmoidoscopy N/A 07/07/2013    Procedure: FLEXIBLE SIGMOIDOSCOPY;  Surgeon: Winfield Cunas., MD;  Location: [facility];  Service: Endoscopy;  Laterality: N/A;  unprepped        Current Outpatient Prescriptions  Medication Sig Dispense Refill  . dabigatran (PRADAXA) 150 MG CAPS capsule Take 150 mg by mouth 2 (two) times daily.      . metoprolol succinate (TOPROL-XL) 25 MG 24 hr tablet Take 25 mg by mouth daily.      . rosuvastatin (CRESTOR) 40 MG tablet Take 40 mg by mouth daily.      . traMADol (ULTRAM) 50 MG tablet Take by mouth every 6 (six) hours as needed.      . traZODone (DESYREL) 50 MG tablet Take 50 mg by mouth at bedtime.      [name]  loperamide (IMODIUM A-D) 2 MG tablet Take 4-6 tablets (8-12 mg total) by mouth daily.  180 tablet  2   No current facility-administered medications for this visit.      No Known Allergies    Family History  Problem Relation Age of Onset  . Coronary artery disease Father   . Heart disease Sister   . Heart disease Mother     History   Social History  . Marital Status: Married    Spouse Name: N/A    Number of Children: N/A  . Years of Education: N/A   Occupational History  . retired    Social History Main Topics  . Smoking status: Former Smoker -- 1.00 packs/day for 20 years    Quit date: 07/02/1989  . Smokeless tobacco: Never Used  . Alcohol Use: 0.0 oz/week     Comment: wine occasionally  . Drug Use: No  . Sexual Activity: None   Other Topics Concern  . None   Social History Narrative  . None      REVIEW OF SYSTEMS - PERTINENT POSITIVES ONLY: Review of Systems - General ROS: negative for - chills, fever or weight loss Hematological and  Lymphatic ROS: negative for - bleeding problems, blood clots or bruising Respiratory ROS: no cough, shortness of breath, or wheezing Cardiovascular ROS: no chest pain or dyspnea on exertion Gastrointestinal ROS: no abdominal pain, change in bowel habits, or black or bloody stools Genito-Urinary ROS: no dysuria, trouble voiding, or hematuria  EXAM: Filed Vitals:   12/14/13 1122  BP: 126/72  Pulse: 73  Temp: 97.8 F (36.6 C)    General appearance: alert and cooperative Resp: clear to auscultation bilaterally Cardio: regular rate and rhythm GI: normal findings: soft, non-tender   Procedure: Anoscopy Surgeon: Marcello Moores Diagnosis: fecal incontinence  Assistant: Cherylann Parr After the risks and benefits were explained, verbal consent was obtained for above procedure  Anesthesia: none Findings: moderate hemorrhoidal cushions, palpable short sphincter complex circumferentially, hard stool in rectal vault, low resting tone, good squeeze    ASSESSMENT AND PLAN: [name] is a 68 y.o. F with long standing fecal incontinence that occurs on a daily basis as well as chronic diarrhea. She has tried multiple fiber supplements, prescription treatments and prn anti motility medications with no long lasting relief. We discussed treatment options including daily use of imodium, solesta injections and sacral nerve stimulation. She declined the injections due to cost. She would like to try using imodium daily for now, and I have written her a script for this. I will also set her up for anal [place] and manometry to objectively measure her sphincter complex. I think she would be a good candidate for sacral nerve stimulation if the daily imodium does not work.      Rosario Adie, MD Colon and Rectal Surgery / [facility]      Visit Diagnoses: 1. Fecal incontinence     Primary Care Physician: [name], MD

## 2014-01-11 NOTE — Interval H&P Note (Signed)
History and Physical Interval Note:  01/11/2014 10:50 AM  [name]  has presented today for surgery, with the diagnosis of fecal incontinance  The various methods of treatment have been discussed with the patient and family. After consideration of risks, benefits and other options for treatment, the patient has consented to  Procedure(s): RECTAL ULTRASOUND (N/A) ANAL RECTAL MANOMETRY (N/A) as a surgical intervention .  The patient's history has been reviewed, patient examined, no change in status, stable for surgery.  I have reviewed the patient's chart and labs.  Questions were answered to the patient's satisfaction.     Rosario Adie, MD  [facility]

## 2014-01-11 NOTE — Discharge Instructions (Signed)
° °  1. DIET: Continue your normal diet and medications.  2. You may have some mild rectal bleeding for the first few days after the procedure.  This should get less and less with time.  Resume any blood thinners the day after your procedure unless directed otherwise by your physician. 3. Take your usually prescribed home medications unless otherwise directed. a. If you have any pain, it is helpful to get up and walk around, as it is usually from excess gas. b. If this is not helpful, you can take an over-the-counter pain medication.  Choose one of the following that works best for you: i. Naproxen (Aleve, etc)  Two 220mg  tabs twice a day ii. Ibuprofen (Advil, etc) Three 200mg  tabs four times a day (every meal & bedtime) iii. If you still have pain after using one of these, please call the office 4.     5. ACTIVITIES as tolerated:   6. You may resume regular (light) daily activities beginning the next day--such as daily self-care, walking, climbing stairs--gradually increasing activities as tolerated.    WHEN TO CALL us [phone]: 1. Fever over 101.5 F (38.5 C)  2. Severe abdominal or chest pain  3. Large amount of rectal bleeding, passing multiple blood clots  4. Dizziness or shortness of breath 5. Increasing nausea or vomiting   The clinic staff is available to answer your questions during regular business hours (8:30am-5pm).  Please dont hesitate to call and ask to speak to one of our nurses for clinical concerns.   If you have a medical emergency, go to the nearest emergency room or call 911.  A surgeon from [facility] is always on call at the [facility], [place], [place], [place], [place]  [address] ? MAIN: [phone] ? TOLL FREE: [phone] ?  FAX [phone] [email or web address]

## 2014-01-12 ENCOUNTER — Encounter (HOSPITAL_COMMUNITY): Payer: Self-pay | Admitting: General Surgery

## 2014-01-25 ENCOUNTER — Ambulatory Visit (INDEPENDENT_AMBULATORY_CARE_PROVIDER_SITE_OTHER): Payer: Medicare Other | Admitting: General Surgery

## 2014-01-25 ENCOUNTER — Encounter (INDEPENDENT_AMBULATORY_CARE_PROVIDER_SITE_OTHER): Payer: Self-pay | Admitting: General Surgery

## 2014-01-25 VITALS — BP 148/86 | HR 60 | Temp 98.1°F | Resp 18 | Ht 62.0 in | Wt 183.2 lb

## 2014-01-25 DIAGNOSIS — R159 Full incontinence of feces: Secondary | ICD-10-CM

## 2014-01-25 NOTE — Patient Instructions (Signed)
Bowel Incontinence  What is incontinence? Incontinence is the impaired ability to control gas or stool. Its severity ranges from mild difficulty with gas control to severe loss of control over liquid and formed stools. Incontinence to stool is a common problem, but often it is not discussed due to embarassment. What causes incontinence? There are many causes of incontinence. Injury during childbirth is one of the most common causes. These injuries may cause a tear in the anal muscles. The nerves supplying the anal muscles may also be injured. While some injuries may be recognized immediately following childbirth, many others may go unnoticed and not become a problem until later in life. In these situations, a prior childbirth may not be recognized as the cause of incontinence. Anal operations or traumatic injury to the tissue surrounding the anal region similarly can damage the anal muscles and hinder bowel control. Some individuals experience loss of strength in the anal muscles as they age. As a result, a minor control problem in a younger person may become more significant later in life. Diarrhea may be associated with a feeling of urgency or stool leakage due to the frequent liquid stools passing through the anal opening. If bleeding accompanies your bowel movements or you have lack of bowel control, consult your physician. These symptoms may indicate inflammation within the colon (colitis), a rectal tumor or rectal prolapse - all conditions that require prompt evaluation by a physician.    How is the cause of incontinence determined? An initial discussion of the problem with your physician will help establish the degree of control difficulty and its impact on your lifestyle. Many clues to the origin of incontinence may be found in patient histories. For example, a woman's history of past childbirths is very important. Multiple pregnancies, large weight babies, forceps deliveries, or episiotomies  may contribute to muscle or nerve injury at the time of childbirth. In some cases, medical illnesses and medications play a role in problems with control. A physical exam of the anal region should be performed. It may readily identify an obvious injury to the anal muscles. In addition, an ultrasound probe can be used within the anal area to provide a picture of the muscles and show areas in which the anal muscles have been injured. Frequently, additional studies are required to define the anal area more completely. In a test called anal manometry, a small catheter is placed into the anus to record pressure as patients relax and tighten the anal muscles. This test can demonstrate how weak or strong the muscle really is. A separate test may also be conducted to determine if the nerves that go to the anal muscles are functioning properly. What can be done to correct the problem? Treatment of incontinence may include: .     Dietary changes .     Constipating medications .     Muscle strengthening exercises .     Biofeedback  Injectable bulking agents  Surgical muscle repair  Artificial anal sphincter  Sacral nerve stimulator  After a careful history, physical examination and testing to determine the cause and severity of the problem, treatment can be addressed. Mild problems may be treated very simply with dietary changes and the use of some constipating medications. Diseases which cause inflammation in the rectum, such as colitis, may contribute to anal control problems. Treating these diseases also may eliminate or improve symptoms of incontinence. Sometimes a change in prescribed medications may help. Your physician also may recommend simple home exercises that may   strengthen the anal muscles to help in mild cases. A type of physical therapy called biofeedback can be used to help patients sense when stool is ready to be evacuated and help strengthen the muscles. Injuries to the anal muscles may be  repaired with surgery. Some individuals may benefit from a technique that delivers electrical energy to the skin and muscles surrounding the anus which results in firming and thickening of this area to help with continence. In certain individuals that have nerve damage or anal muscles that are damaged beyond repair, an artificial sphincter may be implanted. The artificial sphincter is a plastic, fluid filled doughnut that is surgically implanted around the damaged anal sphincter. This artificial sphincter keeps the anal canal closed. When an individual wants to have a bowel movement, the fluid can be pumped out of the doughnut to allow the anal canal to open. In extreme cases, patients may find that a colostomy is the best option for improving their quality of life. What is a colon and rectal surgeon? Colon and rectal surgeons are experts in the surgical and non-surgical treatment of diseases of the colon, rectum and anus. They have completed advanced surgical training in the treatment of these diseases as well as full general surgical training. Board-certified colon and rectal surgeons complete residencies in general surgery and colon and rectal surgery, and pass intensive examinations conducted by the American Board of Surgery and the American Board of Colon and Rectal Surgery. They are well-versed in the treatment of both benign and malignant diseases of the colon, rectum and anus and are able to perform routine screening examinations and surgically treat conditions if indicated to do so.  2012 American Society of Colon & Rectal Surgeons    

## 2014-01-25 NOTE — Progress Notes (Signed)
[name] is a 68 y.o. female who is here for a follow up visit regarding her fecal incontinence.  She has failed fiber therapy and uses imodium with some success.  She is still having multiple episodes of leakage throughout the week.    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  Manometry showed global muscle dysfunction.    Assessment and Plan: I offered her Solesta injections vs SNS.  She would like to pursue the SNS at this time.  I will have her start a symptom diary and have the [employer] rep get in touch with her to discuss the device in more detail.  If she decides to do this, we will set this up as an outpatient procedure.      Rosario Adie, MD [facility], [place]

## 2014-02-10 ENCOUNTER — Encounter (INDEPENDENT_AMBULATORY_CARE_PROVIDER_SITE_OTHER): Payer: Self-pay | Admitting: General Surgery

## 2014-02-15 ENCOUNTER — Encounter (INDEPENDENT_AMBULATORY_CARE_PROVIDER_SITE_OTHER): Payer: Self-pay

## 2014-02-15 ENCOUNTER — Ambulatory Visit (INDEPENDENT_AMBULATORY_CARE_PROVIDER_SITE_OTHER): Payer: Medicare Other | Admitting: General Surgery

## 2014-02-15 VITALS — BP 140/70 | HR 74 | Temp 98.2°F | Ht 62.0 in | Wt 187.4 lb

## 2014-02-15 DIAGNOSIS — I200 Unstable angina: Secondary | ICD-10-CM

## 2014-02-15 DIAGNOSIS — R159 Full incontinence of feces: Secondary | ICD-10-CM

## 2014-02-15 NOTE — Progress Notes (Signed)
[name] is a 68 y.o. female who is here for a follow up visit regarding her fecal incontinence. She has failed fiber therapy and uses imodium with some success but continues to have several episodes of leakage throughout the week.  She has completed her symptom diary and is ready to proceed with testing for a SNS. She is having at least one episode a day.  She actually has a niece with this device for bladder dysfunction.    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   Assessment and Plan:   She would like to pursue the SNS at this time.  We will check with her cardiologist about coming off of her Pradaxa.  Once we have obtained this, we will schedule the test procedure.

## 2014-02-15 NOTE — Patient Instructions (Signed)
We will call you within the next week to schedule the procedure.

## 2014-02-17 ENCOUNTER — Telehealth (INDEPENDENT_AMBULATORY_CARE_PROVIDER_SITE_OTHER): Payer: Self-pay

## 2014-02-17 NOTE — Telephone Encounter (Signed)
Called and spoke to patient to make aware to hold Pradaxa 4 days prior to surgery per Dr. Rayann Heman.  Patient still waiting to hear fron our office regarding surgery date.  Patient aware that Dr. Erasmo Downer will indicate when she can restart Pradaxa post op.  Patient verbalized understanding.

## 2014-03-03 ENCOUNTER — Encounter (HOSPITAL_BASED_OUTPATIENT_CLINIC_OR_DEPARTMENT_OTHER): Payer: Self-pay | Admitting: *Deleted

## 2014-03-04 ENCOUNTER — Encounter (HOSPITAL_BASED_OUTPATIENT_CLINIC_OR_DEPARTMENT_OTHER): Payer: Self-pay | Admitting: *Deleted

## 2014-03-04 NOTE — Progress Notes (Signed)
Pt instructed npo p mn 9/8 x lopressor w sip of water.  To [facility] 9/9 @ 0845. Needs istat on arrival.  ekg in epic.

## 2014-03-06 ENCOUNTER — Other Ambulatory Visit: Payer: Self-pay | Admitting: Internal Medicine

## 2014-03-10 ENCOUNTER — Ambulatory Visit (HOSPITAL_COMMUNITY): Payer: Medicare Other

## 2014-03-10 ENCOUNTER — Encounter (HOSPITAL_BASED_OUTPATIENT_CLINIC_OR_DEPARTMENT_OTHER)
Admission: RE | Disposition: A | Discharge status: Home / Self Care | Payer: Self-pay | Source: Ambulatory Visit | Attending: General Surgery

## 2014-03-10 ENCOUNTER — Encounter (HOSPITAL_BASED_OUTPATIENT_CLINIC_OR_DEPARTMENT_OTHER): Payer: Medicare Other | Admitting: Anesthesiology

## 2014-03-10 ENCOUNTER — Encounter (HOSPITAL_BASED_OUTPATIENT_CLINIC_OR_DEPARTMENT_OTHER): Payer: Self-pay

## 2014-03-10 ENCOUNTER — Ambulatory Visit (HOSPITAL_BASED_OUTPATIENT_CLINIC_OR_DEPARTMENT_OTHER)
Admission: RE | Admit: 2014-03-10 | Discharge: 2014-03-10 | Disposition: A | Discharge status: Home / Self Care | Payer: Medicare Other | Source: Ambulatory Visit | Attending: General Surgery | Admitting: General Surgery

## 2014-03-10 ENCOUNTER — Ambulatory Visit (HOSPITAL_BASED_OUTPATIENT_CLINIC_OR_DEPARTMENT_OTHER): Payer: Medicare Other | Admitting: Anesthesiology

## 2014-03-10 DIAGNOSIS — Z87891 Personal history of nicotine dependence: Secondary | ICD-10-CM | POA: Insufficient documentation

## 2014-03-10 DIAGNOSIS — K589 Irritable bowel syndrome without diarrhea: Secondary | ICD-10-CM | POA: Diagnosis not present

## 2014-03-10 DIAGNOSIS — Z90710 Acquired absence of both cervix and uterus: Secondary | ICD-10-CM | POA: Insufficient documentation

## 2014-03-10 DIAGNOSIS — M1990 Unspecified osteoarthritis, unspecified site: Secondary | ICD-10-CM | POA: Diagnosis not present

## 2014-03-10 DIAGNOSIS — I2510 Atherosclerotic heart disease of native coronary artery without angina pectoris: Secondary | ICD-10-CM | POA: Diagnosis not present

## 2014-03-10 DIAGNOSIS — R159 Full incontinence of feces: Secondary | ICD-10-CM | POA: Diagnosis not present

## 2014-03-10 DIAGNOSIS — Z9089 Acquired absence of other organs: Secondary | ICD-10-CM | POA: Insufficient documentation

## 2014-03-10 DIAGNOSIS — E119 Type 2 diabetes mellitus without complications: Secondary | ICD-10-CM | POA: Diagnosis not present

## 2014-03-10 DIAGNOSIS — I10 Essential (primary) hypertension: Secondary | ICD-10-CM | POA: Diagnosis not present

## 2014-03-10 DIAGNOSIS — I4891 Unspecified atrial fibrillation: Secondary | ICD-10-CM | POA: Diagnosis not present

## 2014-03-10 DIAGNOSIS — E785 Hyperlipidemia, unspecified: Secondary | ICD-10-CM | POA: Diagnosis not present

## 2014-03-10 HISTORY — DX: Personal history of urinary (tract) infections: Z87.440

## 2014-03-10 HISTORY — DX: Paroxysmal atrial fibrillation: I48.0

## 2014-03-10 HISTORY — DX: Other specified postprocedural states: Z85.828

## 2014-03-10 HISTORY — DX: Presence of coronary angioplasty implant and graft: Z95.5

## 2014-03-10 HISTORY — DX: Personal history of other diseases of the circulatory system: Z86.79

## 2014-03-10 HISTORY — DX: Other specified postprocedural states: Z98.890

## 2014-03-10 HISTORY — PX: VAGUS NERVE STIMULATOR INSERTION: SHX348

## 2014-03-10 LAB — POCT I-STAT 4, (NA,K, GLUC, HGB,HCT)
Glucose, Bld: 108 mg/dL — ABNORMAL HIGH (ref 70–99)
HCT: 37 % (ref 36.0–46.0)
Hemoglobin: 12.6 g/dL (ref 12.0–15.0)
POTASSIUM: 3.6 meq/L — AB (ref 3.7–5.3)
SODIUM: 139 meq/L (ref 137–147)

## 2014-03-10 IMAGING — RF DG SACRUM/COCCYX 2+V
1 series · 2 of 2 positions shown · non-contrast
Comparison: none

CLINICAL DATA: Fluoroscopy for stimulator placement

EXAM:
DG C-ARM 1-60 MIN - NRPT [facility]; SACRUM AND COCCYX - 2+ VIEW

[Series 1: run · 2 of 2 slices shown]
[im 1/2]
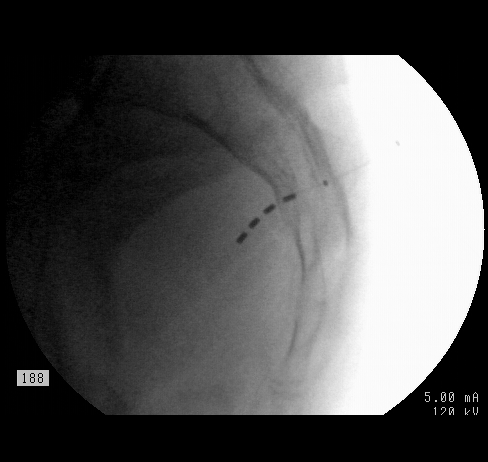
[im 2/2]
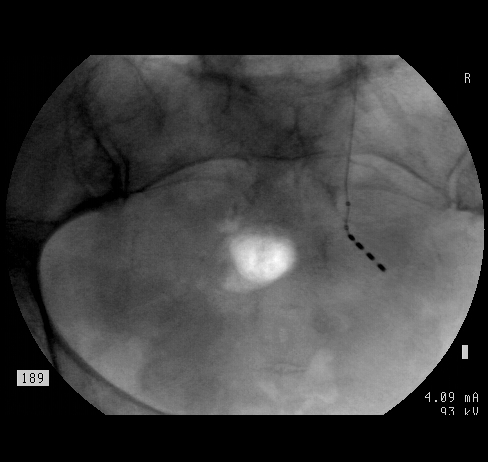

[2 of 2 positions shown; findings below may reference images not displayed]

FINDINGS: Lateral views over the sacrum and AP view, demonstrate stimulator
placement. Stimulator is seen anterior to the mid sacrum on the
lateral view and to the left of midline on the AP view.
IMPRESSION: Stimulator as described

## 2014-03-10 SURGERY — VAGAL NERVE STIMULATOR IMPLANT
Anesthesia: Monitor Anesthesia Care | Site: Buttocks

## 2014-03-10 MED ORDER — FENTANYL CITRATE 0.05 MG/ML IJ SOLN
INTRAMUSCULAR | Status: AC
  Filled 2014-03-10: qty 4

## 2014-03-10 MED ORDER — CEFAZOLIN SODIUM-DEXTROSE 2-3 GM-% IV SOLR
2.0000 g | INTRAVENOUS | Status: AC
  Administered 2014-03-10: 2 g via INTRAVENOUS
  Filled 2014-03-10: qty 50

## 2014-03-10 MED ORDER — SODIUM CHLORIDE 0.9 % IJ SOLN
3.0000 mL | INTRAMUSCULAR | Status: DC | PRN
  Filled 2014-03-10: qty 3

## 2014-03-10 MED ORDER — LACTATED RINGERS IV SOLN
INTRAVENOUS | Status: DC
  Filled 2014-03-10: qty 1000

## 2014-03-10 MED ORDER — LACTATED RINGERS IV SOLN
INTRAVENOUS | Status: DC
  Administered 2014-03-10: 09:00:00 via INTRAVENOUS
  Filled 2014-03-10: qty 1000

## 2014-03-10 MED ORDER — ACETAMINOPHEN 10 MG/ML IV SOLN
INTRAVENOUS | Status: DC | PRN
  Administered 2014-03-10: 1000 mg via INTRAVENOUS

## 2014-03-10 MED ORDER — OXYCODONE HCL 5 MG PO TABS
5.0000 mg | ORAL_TABLET | ORAL | Status: DC | PRN
  Filled 2014-03-10: qty 2

## 2014-03-10 MED ORDER — FENTANYL CITRATE 0.05 MG/ML IJ SOLN
INTRAMUSCULAR | Status: DC | PRN
  Administered 2014-03-10: 50 ug via INTRAVENOUS
  Administered 2014-03-10 (×2): 25 ug via INTRAVENOUS

## 2014-03-10 MED ORDER — MIDAZOLAM HCL 2 MG/2ML IJ SOLN
INTRAMUSCULAR | Status: AC
  Filled 2014-03-10: qty 2

## 2014-03-10 MED ORDER — SODIUM CHLORIDE 0.9 % IV SOLN
250.0000 mL | INTRAVENOUS | Status: DC | PRN
  Filled 2014-03-10: qty 250

## 2014-03-10 MED ORDER — MIDAZOLAM HCL 5 MG/5ML IJ SOLN
INTRAMUSCULAR | Status: DC | PRN
  Administered 2014-03-10: 2 mg via INTRAVENOUS

## 2014-03-10 MED ORDER — BUPIVACAINE-EPINEPHRINE 0.5% -1:200000 IJ SOLN
INTRAMUSCULAR | Status: DC | PRN
  Administered 2014-03-10: 30 mL

## 2014-03-10 MED ORDER — TRAMADOL HCL 50 MG PO TABS: 50.0000 mg | ORAL_TABLET | Freq: Four times a day (QID) | ORAL | Status: DC | PRN

## 2014-03-10 MED ORDER — FENTANYL CITRATE 0.05 MG/ML IJ SOLN
25.0000 ug | INTRAMUSCULAR | Status: DC | PRN
  Filled 2014-03-10: qty 1

## 2014-03-10 MED ORDER — PROPOFOL 10 MG/ML IV EMUL
INTRAVENOUS | Status: DC | PRN
  Administered 2014-03-10: 50 ug/kg/min via INTRAVENOUS

## 2014-03-10 MED ORDER — ACETAMINOPHEN 325 MG PO TABS
650.0000 mg | ORAL_TABLET | ORAL | Status: DC | PRN
  Filled 2014-03-10: qty 2

## 2014-03-10 MED ORDER — CEFAZOLIN SODIUM-DEXTROSE 2-3 GM-% IV SOLR
INTRAVENOUS | Status: AC
  Filled 2014-03-10: qty 50

## 2014-03-10 MED ORDER — ACETAMINOPHEN 650 MG RE SUPP
650.0000 mg | RECTAL | Status: DC | PRN
  Filled 2014-03-10: qty 1

## 2014-03-10 MED ORDER — SODIUM CHLORIDE 0.9 % IJ SOLN
3.0000 mL | Freq: Two times a day (BID) | INTRAMUSCULAR | Status: DC
  Filled 2014-03-10: qty 3

## 2014-03-10 SURGICAL SUPPLY — 67 items
BAG URINE DRAINAGE (UROLOGICAL SUPPLIES) IMPLANT
BANDAGE ADHESIVE 1X3 (GAUZE/BANDAGES/DRESSINGS) IMPLANT
BENZOIN TINCTURE PRP APPL 2/3 (GAUZE/BANDAGES/DRESSINGS) ×3 IMPLANT
BLADE SURG 15 STRL LF DISP TIS (BLADE) ×1 IMPLANT
BLADE SURG 15 STRL SS (BLADE) ×2
CABLE TEST STIMULATION (UROLOGICAL SUPPLIES) ×3 IMPLANT
CABLE TWIST LOCK 25CM (UROLOGICAL SUPPLIES) ×3 IMPLANT
CATH FOLEY 2WAY SLVR  5CC 16FR (CATHETERS)
CATH FOLEY 2WAY SLVR 5CC 16FR (CATHETERS) IMPLANT
CHLORAPREP W/TINT 26ML (MISCELLANEOUS) ×3 IMPLANT
CLOSURE WOUND 1/2 X4 (GAUZE/BANDAGES/DRESSINGS) ×1
CLOTH BEACON ORANGE TIMEOUT ST (SAFETY) ×3 IMPLANT
COVER MAYO STAND STRL (DRAPES) ×3 IMPLANT
COVER TABLE BACK 60X90 (DRAPES) ×3 IMPLANT
DERMABOND ADVANCED (GAUZE/BANDAGES/DRESSINGS) ×2
DERMABOND ADVANCED .7 DNX12 (GAUZE/BANDAGES/DRESSINGS) ×1 IMPLANT
DRAPE C-ARM 42X72 X-RAY (DRAPES) ×3 IMPLANT
DRAPE INCISE 23X17 IOBAN STRL (DRAPES)
DRAPE INCISE IOBAN 23X17 STRL (DRAPES) IMPLANT
DRAPE LAPAROSCOPIC ABDOMINAL (DRAPES) ×3 IMPLANT
DRAPE LG THREE QUARTER DISP (DRAPES) ×3 IMPLANT
DRSG TEGADERM 2-3/8X2-3/4 SM (GAUZE/BANDAGES/DRESSINGS) IMPLANT
DRSG TEGADERM 4X4.75 (GAUZE/BANDAGES/DRESSINGS) ×3 IMPLANT
DRSG TELFA 3X8 NADH (GAUZE/BANDAGES/DRESSINGS) IMPLANT
ELECT REM PT RETURN 9FT ADLT (ELECTROSURGICAL) ×3
ELECTRODE REM PT RTRN 9FT ADLT (ELECTROSURGICAL) ×1 IMPLANT
GLOVE BIO SURGEON STRL SZ 6.5 (GLOVE) ×2 IMPLANT
GLOVE BIO SURGEONS STRL SZ 6.5 (GLOVE) ×1
GLOVE BIOGEL M 6.5 STRL (GLOVE) ×3 IMPLANT
GLOVE BIOGEL PI IND STRL 7.5 (GLOVE) ×1 IMPLANT
GLOVE BIOGEL PI INDICATOR 7.5 (GLOVE) ×2
GLOVE INDICATOR 6.0 STRL GRN (GLOVE) ×3 IMPLANT
GLOVE INDICATOR 7.0 STRL GRN (GLOVE) ×3 IMPLANT
GLOVE SURG SS PI 7.5 STRL IVOR (GLOVE) ×3 IMPLANT
GOWN PREVENTION PLUS XXLARGE (GOWN DISPOSABLE) IMPLANT
GOWN STRL REIN XL XLG (GOWN DISPOSABLE) IMPLANT
GOWN STRL REUS W/TWL LRG LVL3 (GOWN DISPOSABLE) ×3 IMPLANT
GOWN STRL REUS W/TWL XL LVL3 (GOWN DISPOSABLE) ×3 IMPLANT
HOLDER FOLEY CATH W/STRAP (MISCELLANEOUS) IMPLANT
INTERSTIM TEST STIMULATION CABLE ×2 IMPLANT
INTRODUCER GUIDE DILATR SHEATH (SET/KITS/TRAYS/PACK) ×3 IMPLANT
LEAD (Lead) ×3 IMPLANT
NEEDLE FORAMEN 20GA 3.5  9CM (NEEDLE) IMPLANT
NEEDLE FORAMEN 20GA 5  12.5CM (NEEDLE) IMPLANT
NEEDLE HYPO 22GX1.5 SAFETY (NEEDLE) ×3 IMPLANT
NEUROSTIMULATOR 1.7X2X.06 (UROLOGICAL SUPPLIES) ×3 IMPLANT
PACK BASIN DAY SURGERY FS (CUSTOM PROCEDURE TRAY) ×3 IMPLANT
PENCIL BUTTON HOLSTER BLD 10FT (ELECTRODE) ×3 IMPLANT
PROGRAMMER ANTENNA EXT (UROLOGICAL SUPPLIES) IMPLANT
PROGRAMMER STIMUL 2.2X1.1X3.7 (UROLOGICAL SUPPLIES) IMPLANT
SPONGE GAUZE 4X4 12PLY STER LF (GAUZE/BANDAGES/DRESSINGS) IMPLANT
STRIP CLOSURE SKIN 1/2X4 (GAUZE/BANDAGES/DRESSINGS) ×2 IMPLANT
SUCTION FRAZIER TIP 10 FR DISP (SUCTIONS) ×3 IMPLANT
SUT SILK 2 0 (SUTURE)
SUT SILK 2-0 18XBRD TIE 12 (SUTURE) IMPLANT
SUT VIC AB 3-0 SH 27 (SUTURE) ×2
SUT VIC AB 3-0 SH 27X BRD (SUTURE) ×1 IMPLANT
SUT VICRYL 4-0 PS2 18IN ABS (SUTURE) ×3 IMPLANT
SYR BULB IRRIGATION 50ML (SYRINGE) ×3 IMPLANT
SYR CONTROL 10ML LL (SYRINGE) ×3 IMPLANT
SYR TB 1ML LL NO SAFETY (SYRINGE) ×3 IMPLANT
SYRINGE 10CC LL (SYRINGE) IMPLANT
TOWEL OR 17X24 6PK STRL BLUE (TOWEL DISPOSABLE) ×6 IMPLANT
TRAY DSU PREP LF (CUSTOM PROCEDURE TRAY) ×3 IMPLANT
TUBE CONNECTING 12'X1/4 (SUCTIONS) ×1
TUBE CONNECTING 12X1/4 (SUCTIONS) ×2 IMPLANT
WATER STERILE IRR 500ML POUR (IV SOLUTION) ×3 IMPLANT

## 2014-03-10 NOTE — Transfer of Care (Signed)
Immediate Anesthesia Transfer of Care Note  Patient: [name]  Procedure(s) Performed: Procedure(s) with comments: IMPLANTATION OF SACRAL  NERVE STIMULATOR  (N/A) - Sacral  Patient Location: PACU  Anesthesia Type:MAC  Level of Consciousness: awake, alert  and oriented  Airway & Oxygen Therapy: Patient Spontanous Breathing  Post-op Assessment: Report given to PACU RN  Post vital signs: Reviewed and stable  Complications: No apparent anesthesia complications

## 2014-03-10 NOTE — Discharge Instructions (Addendum)
GENERAL SURGERY: POST OP INSTRUCTIONS  1. DIET: Follow a light bland diet the first 24 hours after arrival home, such as soup, liquids, crackers, etc.  Be sure to include lots of fluids daily.  Avoid fast food or heavy meals as your are more likely to get nauseated.   2. Take your usually prescribed home medications unless otherwise directed. 3. PAIN CONTROL: a. Pain is best controlled by a usual combination of three different methods TOGETHER: i. Ice/Heat ii. Over the counter pain medication iii. Prescription pain medication b. Most patients will experience some swelling and bruising around the incisions.  Ice packs or heating pads (30-60 minutes up to 6 times a day) will help. Use ice for the first few days to help decrease swelling and bruising, then switch to heat to help relax tight/sore spots and speed recovery.  Some people prefer to use ice alone, heat alone, alternating between ice & heat.  Experiment to what works for you.  Swelling and bruising can take several weeks to resolve.   c. It is helpful to take an over-the-counter pain medication regularly for the first few weeks.  Choose one of the following that works best for you: i. Naproxen (Aleve, etc)  Two 220mg  tabs twice a day ii. Ibuprofen (Advil, etc) Three 200mg  tabs four times a day (every meal & bedtime) d. A  prescription for pain medication (such as Percocet, oxycodone, hydrocodone, etc) should be given to you upon discharge.  Take your pain medication as prescribed.  i. If you are having problems/concerns with the prescription medicine (does not control pain, nausea, vomiting, rash, itching, etc), please call us [phone] to see if we need to switch you to a different pain medicine that will work better for you and/or control your side effect better. ii. If you need a refill on your pain medication, please contact your pharmacy.  They will contact our office to request authorization. Prescriptions will not be filled after 5  pm or on week-ends. 4. Avoid getting constipated.  Between the surgery and the pain medications, it is common to experience some constipation.  Increasing fluid intake and taking a fiber supplement (such as Metamucil, Citrucel, FiberCon, MiraLax, etc) 1-2 times a day regularly will usually help prevent this problem from occurring.  A mild laxative (prune juice, Milk of Magnesia, MiraLax, etc) should be taken according to package directions if there are no bowel movements after 48 hours.   5. Wash everyday.  ( SPONGE  BATH) Do not shower over the dressings.  Do not remove any dressings.   6. See other printed handout for specific instructions regarding your stimulator.  Keep a diary of your symptoms and leakage and record this information until you return for surgery.  7. ACTIVITIES as tolerated:   a. You may resume regular (light) daily activities beginning the next day--such as daily self-care, walking, climbing stairs--gradually increasing activities as tolerated.  If you can walk 30 minutes without difficulty, it is safe to try more intense activity such as jogging, treadmill, bicycling, low-impact aerobics, swimming, etc. b. Save the most intensive and strenuous activity for last such as sit-ups, heavy lifting, contact sports, etc  Refrain from any heavy lifting or straining until you are off narcotics for pain control.   c. DO NOT PUSH THROUGH PAIN.  Let pain be your guide: If it hurts to do something, don't do it.  Pain is your body warning you to avoid that activity for another week until the pain  goes down. d. You may drive when you are no longer taking prescription pain medication, you can comfortably wear a seatbelt, and you can safely maneuver your car and apply brakes. e. Dennis Bast may have sexual intercourse when it is comfortable.  8. FOLLOW UP in our office a. Please call [facility] at [phone] to set up an appointment to see your surgeon in the office for a follow-up appointment approximately  2-3 weeks after your surgery. b. Make sure that you call for this appointment the day you arrive home to insure a convenient appointment time. 9. IF YOU HAVE DISABILITY OR FAMILY LEAVE FORMS, BRING THEM TO THE OFFICE FOR PROCESSING.  DO NOT GIVE THEM TO YOUR DOCTOR.   WHEN TO CALL us [phone]: 1. Poor pain control 2. Reactions / problems with new medications (rash/itching, nausea, etc)  3. Fever over 101.5 F (38.5 C) 4. Worsening swelling or bruising 5. Continued bleeding from incision. 6. Increased pain, redness, or drainage from the incision   The clinic staff is available to answer your questions during regular business hours (8:30am-5pm).  Please dont hesitate to call and ask to speak to one of our nurses for clinical concerns.   If you have a medical emergency, go to the nearest emergency room or call 911.  A surgeon from [facility] is always on call at the [facility], [place], [place], [place], [place]  [id number] ? MAIN: [phone] ? TOLL FREE: [phone] ?  FAX [phone] [email or web address]   Post Anesthesia Home Care Instructions  Activity: Get plenty of rest for the remainder of the day. A responsible adult should stay with you for 24 hours following the procedure.  For the next 24 hours, DO NOT: -Drive a car -Paediatric nurse -Drink alcoholic beverages -Take any medication unless instructed by your physician -Make any legal decisions or sign important papers.  Meals: Start with liquid foods such as gelatin or soup. Progress to regular foods as tolerated. Avoid greasy, spicy, heavy foods. If nausea and/or vomiting occur, drink only clear liquids until the nausea and/or vomiting subsides. Call your physician if vomiting continues.  Special Instructions/Symptoms: Your throat may feel dry or sore from the anesthesia or the breathing tube placed in your throat during surgery. If this  causes discomfort, gargle with warm salt water. The discomfort should disappear within 24 hours.

## 2014-03-10 NOTE — Op Note (Signed)
03/10/2014  3:04 PM  PATIENT:  [name]  68 y.o. female  Patient Care Team: [name], MD as PCP - General (Family Medicine)  PRE-OPERATIVE DIAGNOSIS:  Fecal incontinence  POST-OPERATIVE DIAGNOSIS:  Fecal incontinence  PROCEDURE:  Procedure(s): IMPLANTATION OF SACRAL  NERVE STIMULATOR   SURGEON:  Surgeon(s): Leighton Ruff, MD  ASSISTANT: none   ANESTHESIA:   IV sedation  EBL: min  Total I/O In: 1320 [P.O.:220; I.V.:1100] Out: 800 [Urine:800]  DRAINS: none   SPECIMEN:  No Specimen  DISPOSITION OF SPECIMEN:  N/A  COUNTS:  YES  PLAN OF CARE: Discharge to home after PACU  PATIENT DISPOSITION:  PACU - hemodynamically stable.  INDICATION: Fecal incontinence refractory to medical treatment.  The patient suffers from fecal incontinence ~ 6-8 times per day.  she has tried multiple fiber supplements and anti-diarrhea medications to manage this without success.  Anal manometry shows global sphincter dysfunction.  Anal US shows an intact external sphincter and ~180 degree separation of internal sphincter.  The risks and benefits of the surgery were described to the patient and consent was signed and placed on chart prior to the OR.  DESCRIPTION: the patient was identified in the preoperative holding area and taken to the OR where they were laid prone on the operating room table.  MAC anesthesia was induced without difficulty. SCDs were also noted to be in place prior to the initiation of anesthesia.  Pillows were placed under lower abdomen to flatten the sacrum and under shins to allow the toes to dangle freely. A ground pad was placed on the bottom of the patient's foot and the proximal ends of the j-hook patient cable were connected to the ground pad and the external neurostimulator (ENS).The patient was then prepped and draped in the usual sterile fashion.  A surgical timeout was performed indicating the correct patient, procedure, positioning and need for preoperative  antibiotics.  The c-arm was moved into AP position to provide fluoroscopic guidance of the sacrum. The medial edges of the foramina were identified and marked. The c-arm was then moved into the lateral position to identify the S3 foramen. Once the needle entry point was determined, local injection of 0.5% marcaine with epinephrine was administered bilaterally. A foramen needle was placed in the superior, medial aspect of the right S3 foramen and appropriate needle depth was visualized utilizing fluoroscopy. Proper S3 needle location was also confirmed by direct observation of the lifting of the perineum or "bellowing," and plantar flexion of the great toe utilizing the j-hook patient cable, the external neurostimulator and Verify controller. The foramen needle stylet was removed and a directional guide was placed through the needle using markers on the guide to assure appropriate depth. The foramen needle was removed by sliding over the directional guide. A small incision was made peripherally to the directional guide through the skin. The lead introducer with dilator was placed over the directional guide and utilizing fluoroscopic guidance, the lead introducer was advanced until the radiopaque mark was half-way through the foramen. The dilator was removed along with the directional guide. Using fluoroscopy, the tined lead with bent stylet was placed through the introducer until electrodes two and three straddled the anterior surface of the sacrum. All four electrodes were tested, but the correct response was not obtained.  We switched to the straight stylet but still did not observe good neuro responses.  I then switched to the left side and repeated the above steps for correct placement.  All four leads  were tested with a bent stylet in place, and we observed "bellows" and plantar flexion of the great toe utilizing the j-hook patient cable and the external neurostimulator with Verify controller with minimal  settings.  After satisfactory lead positioning was confirmed, the introducer was retracted over the lead under continuous fluoroscopy, deploying the tines into presacral tissue. Retesting of all four electrodes confirming appropriate responses was completed.    The potential internal neurostimulator pocket site was identified below the iliac crest and lateral to the sacrum. Local anesthesia was administered and an incision was made into the subcutaneous tissue creating a connection site. Blunt dissection was used to create a small pocket with hemostasis achieved.  A tunneling tool with sheath was placed from the lead exit site subcutaneously to the small incised pocket site. The tunneling tool was removed and the lead was fed through the sheath, exiting at the pocket connection site. The sheath was removed. The lead was cleaned and dried. A protective boot was placed over the lead; the lead was inserted into the temporary percutaneous extension with visual confirmation of blue tip advancement. The four setscrews were tightened with the torque wrench until audible clicks were heard. The boot was slid over the connection and 2-0 silk ties were sutured to the boot grooves on either side of connection. Using the tunneling tool and sheath, a subcutaneous tunnel was created from the pocket site to the contralateral buttock and exited at a localized site. The percutaneous extension was placed through the sheath, the sheath removed, and the extension exiting the site.  The connection components were placed into the incision. The incisions were closed with 3-0 Vicryl subcuticular sutures and 4-0 Vicryl skin sutures. The incisions were closed with Dermabond.  Adhesive strips and gauze were placed over the percutaneous extension exit site and secured with transparent dressing.   The percutaneous extension was attached to the external white twist lock cable. Gauze was placed under the twist-lock connection with cable strain  relief and secured using transparent dressing. The twist lock cable was then plugged into the external neuorstimulator. The patient was transferred to PACU in satisfactory condition. Using the Verify controller and external neurostimulator, the patient was programmed to the electrode of optimum sensation and provided utilization instructions prior to discharge. Patient will complete a voiding diary during testing period to help document results of this test procedure.  Dr Matilde Sprang was present through the important aspects of the procedure.

## 2014-03-10 NOTE — Anesthesia Postprocedure Evaluation (Signed)
  Anesthesia Post-op Note  Patient: [name]  Procedure(s) Performed: Procedure(s) (LRB): IMPLANTATION OF SACRAL  NERVE STIMULATOR  (N/A)  Patient Location: PACU  Anesthesia Type: MAC  Level of Consciousness: awake and alert   Airway and Oxygen Therapy: Patient Spontanous Breathing  Post-op Pain: mild  Post-op Assessment: Post-op Vital signs reviewed, Patient's Cardiovascular Status Stable, Respiratory Function Stable, Patent Airway and No signs of Nausea or vomiting  Last Vitals:  Filed Vitals:   03/10/14 1240  BP:   Pulse: 61  Temp:   Resp: 16    Post-op Vital Signs: stable   Complications: No apparent anesthesia complications

## 2014-03-10 NOTE — Anesthesia Preprocedure Evaluation (Addendum)
Anesthesia Evaluation  Patient identified by MRN, date of birth, ID band Patient awake    Reviewed: Allergy & Precautions, H&P , NPO status , Patient's Chart, lab work & pertinent test results, reviewed documented beta blocker date and time   Airway Mallampati: II TM Distance: >3 FB Neck ROM: full    Dental  (+) Upper Dentures, Lower Dentures, Dental Advisory Given   Pulmonary shortness of breath and with exertion, former smoker,  breath sounds clear to auscultation  Pulmonary exam normal       Cardiovascular Exercise Tolerance: Good hypertension, Pt. on home beta blockers and Pt. on medications + CAD, + Cardiac Stents and +CHF Rhythm:regular Rate:Normal     Neuro/Psych negative neurological ROS  negative psych ROS   GI/Hepatic negative GI ROS, Neg liver ROS,   Endo/Other  diabetes, Well Controlled, Type 2Diet controlled DM  Renal/GU negative Renal ROS  negative genitourinary   Musculoskeletal   Abdominal   Peds  Hematology negative hematology ROS (+)   Anesthesia Other Findings   Reproductive/Obstetrics negative OB ROS                         Anesthesia Physical Anesthesia Plan  ASA: III  Anesthesia Plan: MAC   Post-op Pain Management:    Induction:   Airway Management Planned: Nasal Cannula  Additional Equipment:   Intra-op Plan:   Post-operative Plan:   Informed Consent: I have reviewed the patients History and Physical, chart, labs and discussed the procedure including the risks, benefits and alternatives for the proposed anesthesia with the patient or authorized representative who has indicated his/her understanding and acceptance.   Dental Advisory Given  Plan Discussed with: CRNA and Surgeon  Anesthesia Plan Comments:        Anesthesia Quick Evaluation

## 2014-03-10 NOTE — H&P (Signed)
[name] is a 68 y.o. female who is here for her fecal incontinence. She has failed fiber therapy and uses imodium with some success but continues to have several episodes of leakage throughout the week. She has completed her symptom diary and is ready to proceed with testing for a SNS. She is having at least one episode a day. She actually has a niece with this device for bladder dysfunction and understands the device rather well.   Past Medical History  Diagnosis Date  . Hyperlipidemia   . Coronary artery disease     a.  s/p Xience DES to RCA 04/2009;   b. TEE 2/12: EF 40%, Large PFO;  c.  Lexiscan Myoview 05/2012: EF 69%, no ischemia. LHC (05/2012):  Ostial diagonal 30-40%, proximal mid ramus intermedius 40-50%, RCA stent patent with 40-50% after stent, then 40%, distal RCA 40-50%, EF 55-65%. Medical therapy continued.;  d.  Carlton Adam Myoview (06/2013):  No ischemia, EF 83%, normal study  . Irritable bowel syndrome (IBS)   . History of recurrent UTIs   . S/P drug eluting coronary stent placement   . History of basal cell carcinoma excision     scalp  . PAF (paroxysmal atrial fibrillation)   . S/P ablation of atrial fibrillation   . Chronic diarrhea   . Fecal incontinence   . Hypertension     pt denies  . Diabetes mellitus     pt denies  . Arthritis     osteoarthritis    Past Surgical History  Procedure Laterality Date  . Total abdominal hysterectomy    . Appendectomy    . Cholecystectomy    . Spinal cyst removed    . Carpal tunnel release Right   . Flexible sigmoidoscopy N/A 07/07/2013    Procedure: FLEXIBLE SIGMOIDOSCOPY;  Surgeon: Winfield Cunas., MD;  Location: [facility];  Service: Endoscopy;  Laterality: N/A;  unprepped  . Back surgery      x3  . Rectal ultrasound N/A 01/11/2014    Procedure: RECTAL ULTRASOUND;  Surgeon: Leighton Ruff, MD;  Location: [facility];  Service: Endoscopy;  Laterality: N/A;  . Anal rectal manometry N/A 01/11/2014    Procedure: ANAL  RECTAL MANOMETRY;  Surgeon: Leighton Ruff, MD;  Location: [facility];  Service: Endoscopy;  Laterality: N/A;  . Bunionectomy Bilateral   . Coronary angioplasty with stent placement  04-04-2009  dr Darnell Level brodie    PCI and DES x1 to  mRCA/  mLAD 40%, pCX 30%, pRCA 30%/  normal LVF  . Cardiac catheterization  10-24-2010   dr allred    singl-vessel CAD with patent stent mRCA/  moderate disease mRCA beyond stent segment/  normal lvsf  . Transthoracic echocardiogram  09-19-2011    grade I diastolic dysfunction/  ef 55-60%/  mild TR  . Cardiovascular stress test  06-15-2013  dr allred    normal perfusion study/  no ischemia/  ef 83%  . Cardiac electrophysiology mapping and ablation  08-15-2010  dr allred   Family History  Problem Relation Age of Onset  . Coronary artery disease Father   . Heart disease Sister   . Heart disease Mother    History   Social History  . Marital Status: Married    Spouse Name: N/A    Number of Children: N/A  . Years of Education: N/A   Occupational History  . retired    Social History Main Topics  . Smoking status: Former Smoker -- 1.00 packs/day for 20 years  Quit date: 07/02/1989  . Smokeless tobacco: Never Used  . Alcohol Use: 0.0 oz/week     Comment: wine occasionally  . Drug Use: No  . Sexual Activity: Not on file   Other Topics Concern  . Not on file   Social History Narrative  . No narrative on file    .   Medication List           dabigatran 150 MG Caps capsule  Commonly known as:  PRADAXA  Take 150 mg by mouth 2 (two) times daily.     lisinopril 10 MG tablet  Commonly known as:  PRINIVIL,ZESTRIL  TAKE 1 TABLET DAILY     metoprolol tartrate 25 MG tablet  Commonly known as:  LOPRESSOR  Take 25 mg by mouth 2 (two) times daily.     nitroGLYCERIN 0.4 MG SL tablet  Commonly known as:  NITROSTAT  Place 0.4 mg under the tongue every 5 (five) minutes as needed for chest pain.     rosuvastatin 40 MG tablet  Commonly known as:   CRESTOR  Take 40 mg by mouth daily.     traMADol 50 MG tablet  Commonly known as:  ULTRAM  Take by mouth every 6 (six) hours as needed.     traZODone 50 MG tablet  Commonly known as:  DESYREL  Take 50 mg by mouth at bedtime.      No Known Allergies  Review of Systems - General ROS: negative for - chills or fever Respiratory ROS: no cough, shortness of breath, or wheezing Cardiovascular ROS: no chest pain or dyspnea on exertion Gastrointestinal ROS: no abdominal pain, change in bowel habits, or black or bloody stools Genito-Urinary ROS: no dysuria, trouble voiding, or hematuria Neurological ROS: negative for - bowel and bladder control changes, numbness/tingling or weakness  Filed Vitals:   03/10/14 0856  BP: 156/79  Temp: 97.7 F (36.5 C)  Resp: 18    Physical Exam  Constitutional: She is oriented to person, place, and time and well-developed, well-nourished, and in no distress. No distress.  HENT:  Head: Normocephalic and atraumatic.  Eyes: Conjunctivae are normal. Pupils are equal, round, and reactive to light.  Neck: Normal range of motion. No thyromegaly present.  Cardiovascular: Normal rate and regular rhythm.   Pulmonary/Chest: Effort normal and breath sounds normal.  Abdominal: Soft. Bowel sounds are normal. She exhibits no distension. There is no tenderness.  Musculoskeletal: Normal range of motion.  Neurological: She is alert and oriented to person, place, and time.  Skin: Skin is warm and dry. She is not diaphoretic.   Anal US findings: external sphincter appears intact. Internal sphincter with 180 degree break  Anal Manometry findings: global muscle dysfunction (internal and external sphincters)  Assessment: Fecal Incontinence refractory to medical treatment and inhibiting daily life activities.    Plan: We will insert a SNS test wire today.  She has undergone education via the [employer] representative, education pamphlets and myself.  Risks include  bleeding, pain, dysfunction and infection.  I believe she understands these risks and has agreed to proceed.   She understands that we will return in 2 weeks to implant a permanent device if this helps her symptoms by at least 50%.

## 2014-03-12 ENCOUNTER — Encounter (HOSPITAL_BASED_OUTPATIENT_CLINIC_OR_DEPARTMENT_OTHER): Payer: Self-pay | Admitting: *Deleted

## 2014-03-12 NOTE — Progress Notes (Signed)
NPO AFTER MN. ARRIVE AT 0730. CURRENT ISTAT AND EKG RESULT IN EPIC AND CHART. WILL TAKE LOPRESSOR AM DOS W/ SIPS OF WATER.

## 2014-03-15 ENCOUNTER — Encounter (HOSPITAL_BASED_OUTPATIENT_CLINIC_OR_DEPARTMENT_OTHER): Payer: Self-pay | Admitting: General Surgery

## 2014-03-15 ENCOUNTER — Other Ambulatory Visit (INDEPENDENT_AMBULATORY_CARE_PROVIDER_SITE_OTHER): Payer: Self-pay | Admitting: General Surgery

## 2014-03-18 ENCOUNTER — Ambulatory Visit (HOSPITAL_BASED_OUTPATIENT_CLINIC_OR_DEPARTMENT_OTHER)
Admission: RE | Admit: 2014-03-18 | Discharge: 2014-03-18 | Disposition: A | Discharge status: Home / Self Care | Payer: Medicare Other | Source: Ambulatory Visit | Attending: General Surgery | Admitting: General Surgery

## 2014-03-18 ENCOUNTER — Ambulatory Visit (HOSPITAL_BASED_OUTPATIENT_CLINIC_OR_DEPARTMENT_OTHER): Payer: Medicare Other | Admitting: Anesthesiology

## 2014-03-18 ENCOUNTER — Encounter (HOSPITAL_BASED_OUTPATIENT_CLINIC_OR_DEPARTMENT_OTHER)
Admission: RE | Disposition: A | Discharge status: Home / Self Care | Payer: Self-pay | Source: Ambulatory Visit | Attending: General Surgery

## 2014-03-18 ENCOUNTER — Encounter (HOSPITAL_BASED_OUTPATIENT_CLINIC_OR_DEPARTMENT_OTHER): Payer: Medicare Other | Admitting: Anesthesiology

## 2014-03-18 ENCOUNTER — Encounter (HOSPITAL_BASED_OUTPATIENT_CLINIC_OR_DEPARTMENT_OTHER): Payer: Self-pay | Admitting: *Deleted

## 2014-03-18 DIAGNOSIS — Z79899 Other long term (current) drug therapy: Secondary | ICD-10-CM | POA: Diagnosis not present

## 2014-03-18 DIAGNOSIS — Z9861 Coronary angioplasty status: Secondary | ICD-10-CM | POA: Diagnosis not present

## 2014-03-18 DIAGNOSIS — E785 Hyperlipidemia, unspecified: Secondary | ICD-10-CM | POA: Diagnosis not present

## 2014-03-18 DIAGNOSIS — I10 Essential (primary) hypertension: Secondary | ICD-10-CM | POA: Insufficient documentation

## 2014-03-18 DIAGNOSIS — Z87891 Personal history of nicotine dependence: Secondary | ICD-10-CM | POA: Insufficient documentation

## 2014-03-18 DIAGNOSIS — R159 Full incontinence of feces: Secondary | ICD-10-CM | POA: Insufficient documentation

## 2014-03-18 DIAGNOSIS — I2510 Atherosclerotic heart disease of native coronary artery without angina pectoris: Secondary | ICD-10-CM | POA: Diagnosis not present

## 2014-03-18 SURGERY — INSERTION, PULSE GENERATOR, NEUROSTIMULATOR, GASTRIC
Anesthesia: Monitor Anesthesia Care | Site: Buttocks | Laterality: Right

## 2014-03-18 MED ORDER — LACTATED RINGERS IV SOLN
INTRAVENOUS | Status: DC
  Administered 2014-03-18: 08:00:00 via INTRAVENOUS
  Filled 2014-03-18: qty 1000

## 2014-03-18 MED ORDER — MIDAZOLAM HCL 5 MG/5ML IJ SOLN
INTRAMUSCULAR | Status: DC | PRN
  Administered 2014-03-18: 2 mg via INTRAVENOUS

## 2014-03-18 MED ORDER — ACETAMINOPHEN 650 MG RE SUPP
650.0000 mg | RECTAL | Status: DC | PRN
  Filled 2014-03-18: qty 1

## 2014-03-18 MED ORDER — SODIUM CHLORIDE 0.9 % IJ SOLN
3.0000 mL | Freq: Two times a day (BID) | INTRAMUSCULAR | Status: DC
  Filled 2014-03-18: qty 3

## 2014-03-18 MED ORDER — ACETAMINOPHEN 10 MG/ML IV SOLN
INTRAVENOUS | Status: DC | PRN
  Administered 2014-03-18: 1000 mg via INTRAVENOUS

## 2014-03-18 MED ORDER — SODIUM CHLORIDE 0.9 % IJ SOLN
3.0000 mL | INTRAMUSCULAR | Status: DC | PRN
  Filled 2014-03-18: qty 3

## 2014-03-18 MED ORDER — ONDANSETRON HCL 4 MG/2ML IJ SOLN
INTRAMUSCULAR | Status: DC | PRN
  Administered 2014-03-18: 4 mg via INTRAVENOUS

## 2014-03-18 MED ORDER — MIDAZOLAM HCL 2 MG/2ML IJ SOLN
INTRAMUSCULAR | Status: AC
  Filled 2014-03-18: qty 2

## 2014-03-18 MED ORDER — PROPOFOL 10 MG/ML IV EMUL
INTRAVENOUS | Status: DC | PRN
  Administered 2014-03-18: 50 ug/kg/min via INTRAVENOUS

## 2014-03-18 MED ORDER — CEFAZOLIN SODIUM-DEXTROSE 2-3 GM-% IV SOLR
2.0000 g | INTRAVENOUS | Status: AC
  Administered 2014-03-18: 2 g via INTRAVENOUS
  Filled 2014-03-18: qty 50

## 2014-03-18 MED ORDER — BUPIVACAINE-EPINEPHRINE 0.5% -1:200000 IJ SOLN
INTRAMUSCULAR | Status: DC | PRN
  Administered 2014-03-18: 10 mL

## 2014-03-18 MED ORDER — CEFAZOLIN SODIUM-DEXTROSE 2-3 GM-% IV SOLR
INTRAVENOUS | Status: AC
  Filled 2014-03-18: qty 50

## 2014-03-18 MED ORDER — FENTANYL CITRATE 0.05 MG/ML IJ SOLN
INTRAMUSCULAR | Status: DC | PRN
  Administered 2014-03-18: 25 ug via INTRAVENOUS
  Administered 2014-03-18: 50 ug via INTRAVENOUS
  Administered 2014-03-18: 25 ug via INTRAVENOUS

## 2014-03-18 MED ORDER — LIDOCAINE HCL (CARDIAC) 20 MG/ML IV SOLN
INTRAVENOUS | Status: DC | PRN
  Administered 2014-03-18: 50 mg via INTRAVENOUS

## 2014-03-18 MED ORDER — FENTANYL CITRATE 0.05 MG/ML IJ SOLN
INTRAMUSCULAR | Status: AC
  Filled 2014-03-18: qty 4

## 2014-03-18 MED ORDER — ACETAMINOPHEN 325 MG PO TABS
650.0000 mg | ORAL_TABLET | ORAL | Status: DC | PRN
  Filled 2014-03-18: qty 2

## 2014-03-18 MED ORDER — OXYCODONE HCL 5 MG PO TABS
5.0000 mg | ORAL_TABLET | ORAL | Status: DC | PRN
  Filled 2014-03-18: qty 2

## 2014-03-18 MED ORDER — DEXAMETHASONE SODIUM PHOSPHATE 4 MG/ML IJ SOLN
INTRAMUSCULAR | Status: DC | PRN
  Administered 2014-03-18: 10 mg via INTRAVENOUS

## 2014-03-18 MED ORDER — SODIUM CHLORIDE 0.9 % IV SOLN
250.0000 mL | INTRAVENOUS | Status: DC | PRN
Start: 2014-03-18 — End: 2014-03-18
  Filled 2014-03-18: qty 250

## 2014-03-18 SURGICAL SUPPLY — 51 items
BAG URINE LEG 500ML (DRAIN) IMPLANT
BANDAGE ADHESIVE 1X3 (GAUZE/BANDAGES/DRESSINGS) IMPLANT
BENZOIN TINCTURE PRP APPL 2/3 (GAUZE/BANDAGES/DRESSINGS) IMPLANT
BLADE HEX COATED 2.75 (ELECTRODE) ×3 IMPLANT
BLADE SURG 15 STRL LF DISP TIS (BLADE) ×1 IMPLANT
BLADE SURG 15 STRL SS (BLADE) ×2
CATH FOLEY 2WAY SLVR  5CC 16FR (CATHETERS)
CATH FOLEY 2WAY SLVR 5CC 16FR (CATHETERS) IMPLANT
CLOSURE WOUND 1/2 X4 (GAUZE/BANDAGES/DRESSINGS) ×1
CLOTH BEACON ORANGE TIMEOUT ST (SAFETY) ×3 IMPLANT
COVER MAYO STAND STRL (DRAPES) ×3 IMPLANT
COVER PROBE W GEL 5X96 (DRAPES) ×3 IMPLANT
COVER TABLE BACK 60X90 (DRAPES) ×3 IMPLANT
DERMABOND ADVANCED (GAUZE/BANDAGES/DRESSINGS) ×2
DERMABOND ADVANCED .7 DNX12 (GAUZE/BANDAGES/DRESSINGS) ×1 IMPLANT
DRAPE C-ARM 42X72 X-RAY (DRAPES) ×3 IMPLANT
DRAPE INCISE 23X17 IOBAN STRL (DRAPES) ×2
DRAPE INCISE IOBAN 23X17 STRL (DRAPES) ×1 IMPLANT
DRAPE LAPAROSCOPIC ABDOMINAL (DRAPES) ×3 IMPLANT
DRESSING TELFA 8X3 (GAUZE/BANDAGES/DRESSINGS) IMPLANT
DRSG TEGADERM 4X4.75 (GAUZE/BANDAGES/DRESSINGS) IMPLANT
DRSG TELFA 3X8 NADH (GAUZE/BANDAGES/DRESSINGS) ×3 IMPLANT
ELECT REM PT RETURN 9FT ADLT (ELECTROSURGICAL)
ELECTRODE REM PT RTRN 9FT ADLT (ELECTROSURGICAL) IMPLANT
GAUZE SPONGE 4X4 12PLY STRL LF (GAUZE/BANDAGES/DRESSINGS) IMPLANT
GLOVE BIO SURGEON STRL SZ7.5 (GLOVE) ×3 IMPLANT
GLOVE BIOGEL PI IND STRL 7.5 (GLOVE) ×2 IMPLANT
GLOVE BIOGEL PI INDICATOR 7.5 (GLOVE) ×4
GLOVE SURG SS PI 7.5 STRL IVOR (GLOVE) ×3 IMPLANT
GOWN L4 XXLG W/PAP TWL (GOWN DISPOSABLE) ×3 IMPLANT
GOWN PREVENTION PLUS LG XLONG (DISPOSABLE) IMPLANT
GOWN STRL REIN XL XLG (GOWN DISPOSABLE) IMPLANT
GOWN STRL REUS W/TWL XL LVL3 (GOWN DISPOSABLE) ×3 IMPLANT
INTRODUCER GUIDE DILATR SHEATH (SET/KITS/TRAYS/PACK) IMPLANT
NEEDLE FORAMEN 20GA 3.5  9CM (NEEDLE) IMPLANT
NEEDLE FORAMEN 20GA 5  12.5CM (NEEDLE) IMPLANT
NEEDLE HYPO 22GX1.5 SAFETY (NEEDLE) IMPLANT
PACK BASIN DAY SURGERY FS (CUSTOM PROCEDURE TRAY) ×3 IMPLANT
PENCIL BUTTON HOLSTER BLD 10FT (ELECTRODE) IMPLANT
PROGRAMMER ANTENNA EXT (UROLOGICAL SUPPLIES) ×6 IMPLANT
PROGRAMMER STIMUL 2.2X1.1X3.7 (UROLOGICAL SUPPLIES) ×6 IMPLANT
STAPLER VISISTAT 35W (STAPLE) IMPLANT
STIMULATOR INTERSTIM 2X1.7X.3 (Orthopedic Implant) ×3 IMPLANT
STRIP CLOSURE SKIN 1/2X4 (GAUZE/BANDAGES/DRESSINGS) ×2 IMPLANT
SUT VIC AB 3-0 SH 27 (SUTURE) ×2
SUT VIC AB 3-0 SH 27X BRD (SUTURE) ×1 IMPLANT
SUT VIC AB 4-0 PS2 18 (SUTURE) ×3 IMPLANT
SUT VICRYL 4-0 PS2 18IN ABS (SUTURE) ×3 IMPLANT
SYR BULB IRRIGATION 50ML (SYRINGE) ×3 IMPLANT
SYR CONTROL 10ML LL (SYRINGE) IMPLANT
TOWEL OR 17X24 6PK STRL BLUE (TOWEL DISPOSABLE) ×6 IMPLANT

## 2014-03-18 NOTE — Op Note (Signed)
03/18/2014  9:59 AM  PATIENT:  [name]  68 y.o. female  Patient Care Team: [name], MD as PCP - General (Family Medicine)  PRE-OPERATIVE DIAGNOSIS:  Fecal incontinence  POST-OPERATIVE DIAGNOSIS:  Fecal incontinence  PROCEDURE:  INSERTION OF SACRAL NEUROSTIMULATOR PULSE GENERATOR  SURGEON:  Surgeon(s): Leighton Ruff, MD  ASSISTANT: none   ANESTHESIA:   local and MAC  EBL:  Total I/O In: 500 [I.V.:500] Out: -   DRAINS: none   SPECIMEN:  No Specimen  DISPOSITION OF SPECIMEN:  N/A  COUNTS:  YES  PLAN OF CARE: Discharge to home after PACU  PATIENT DISPOSITION:  PACU - hemodynamically stable.  INDICATION: Fecal incontinence.  The patient has underwent the test phase for 7 days and has had 0 episodes since then.  This is a 100% reduction in symptoms.  The patient agrees to proceed with full implantation of the neuro stimulator device.   OR FINDINGS: Wire lead intact.  Impedence values < 1000.  DESCRIPTION: The patient was identified in the preoperative holding area and taken to the OR where they were laid prone on the operating room table.  MAC anesthesia was induced without difficulty. SCDs were also noted to be in place prior to the initiation of anesthesia.  The patient was then prepped and draped in the usual sterile fashion.  A surgical timeout was performed indicating the correct patient, procedure, positioning and need for preoperative antibiotics.  Local injection of 0.5% Marcaine with epinephrine was administered.  The previous upper buttock incision was opened using blunt dissection and the lead/percutaneous connection was identified and evaluated. The percutaneous extension was cut on the braided wire and was removed from the field ensuring sterility. The subcutaneous pocket was enlarged to accommodate the internal neurostimulator (INS) and hemostasis was established. The sutures securing the protective boot were cut and the boot retracted to expose the  four set screws. Using the torque wrench, the screws were loosened and the percutaneous extension was disconnected from the lead. The boot and connection were removed and discarded.  The lead was cleaned and dried. The lead was inserted into the header of the InterStim neurostimulator until the blue tip was visualized at the distal window. The single set screw was tightened using the torque wrench until audible click was heard. The neurostimulator was placed into the pocket with the etched identification side placed upwards and any excessive lead was placed around the neurostimulator. The clinician programmer telemetry head, covered in a sterile sleeve, was placed over the implanted neurostimulator to ensure proper lead connection and that parameters were within normal range. Impedances were confirmed to be within normal limits, greater than 50 and less than 4,000 ohms. The wound was irrigated with sterile water and closed with a running 3-0 Vicryl subcuticular suture and a 4-0 Vicryl running skin suture. Counts were correct. Dermabond was placed over the incision. The patient was transferred to the PACU in satisfactory condition. Using the clinician programmer, the INS was programmed.   Patient was provided utilization instructions for the patient programmer prior to discharge.

## 2014-03-18 NOTE — Transfer of Care (Signed)
Immediate Anesthesia Transfer of Care Note  Patient: [name]  Procedure(s) Performed: Procedure(s) (LRB): INSERTION OF GASTRIC NEUROSTIMULATOR PULSE GENERATOR (Right)  Patient Location: PACU  Anesthesia Type:MAC  Level of Consciousness: awake, alert  and oriented  Airway & Oxygen Therapy: Patient Spontanous Breathing and Patient connected to face mask oxygen  Post-op Assessment: Report given to PACU RN and Post -op Vital signs reviewed and stable  Post vital signs: Reviewed and stable  Complications: No apparent anesthesia complications

## 2014-03-18 NOTE — Interval H&P Note (Signed)
History and Physical Interval Note:  03/18/2014 8:44 AM  [name]  has presented today for surgery, with the diagnosis of Fecal incontinence  The various methods of treatment have been discussed with the patient and family. After consideration of risks, benefits and other options for treatment, the patient has consented to  Procedure(s): INSERTION OF GASTRIC NEUROSTIMULATOR PULSE GENERATOR (N/A) as a surgical intervention .  The patient's history has been reviewed, patient examined, no change in status, stable for surgery.  I have reviewed the patient's chart and labs.  Questions were answered to the patient's satisfaction.    Patient has had no episodes since getting the device implanted.  We will proceed with implantation of battery pack.  Risks include bleeding, infection and failure of device.    Amaris Garrette C.

## 2014-03-18 NOTE — Discharge Instructions (Addendum)
Sacral Nerve Stimulator Implantation: POST OP INSTRUCTIONS  Always review your discharge instruction sheet given to you by the facility where your surgery was performed.   1. You make take acetaminophen (Tylenol) or ibuprofen (Advil) as needed.  2. Take your usually prescribed medications unless otherwise directed.  Restart any blood thinning medications in 48 hours. 3. If you need a stronger pain medication, please contact our office. All narcotic pain medicine now requires a paper prescription.  Phoned in and fax refills are no longer allowed by law.  Prescriptions will not be filled after 5 pm or on weekends.  4. You should follow a light diet for the remainder of the day after your procedure. 5. Most patients will experience some mild swelling and/or bruising in the area of the incision. It may take several days to resolve. 6. It is common to experience some constipation if taking pain medication after surgery. Increasing fluid intake and taking a stool softener (such as Colace) will usually help or prevent this problem from occurring. A mild laxative (Milk of Magnesia or Miralax) should be taken according to package directions if there are no bowel movements after 48 hours.  7. You have Dermabond (skin glue) on the incision, you may shower in 24 hours.  The glue will flake off over the next 2-3 weeks.    8. ACTIVITIES:  Limit activity for the next 72 hours. Do no strenuous exercise or activity for 6 weeks. You may drive when you are no longer taking prescription pain medication, you can comfortably sit and wear a seatbelt, and you can maneuver your car. 10. You may need to see your doctor in the office for a follow-up appointment.  Please check with your doctor.  11. You will get a product guide and ID card.  Please keep them in case you need them.  WHEN TO CALL YOUR DOCTOR [phone]): 1. Fever over 101.0 2. Chills 3. Continued bleeding from incision 4. Increased redness and tenderness at  the site 5. Shortness of breath, difficulty breathing   The clinic staff is available to answer your questions during regular business hours. Please dont hesitate to call and ask to speak to one of the nurses or medical assistants for clinical concerns. If you have a medical emergency, go to the nearest emergency room or call 911.  A surgeon from [facility] is always on call at the hospital.     For further information, please visit [email or web address]      Post Anesthesia Home Care Instructions  Activity: Get plenty of rest for the remainder of the day. A responsible adult should stay with you for 24 hours following the procedure.  For the next 24 hours, DO NOT: -Drive a car -Paediatric nurse -Drink alcoholic beverages -Take any medication unless instructed by your physician -Make any legal decisions or sign important papers.  Meals: Start with liquid foods such as gelatin or soup. Progress to regular foods as tolerated. Avoid greasy, spicy, heavy foods. If nausea and/or vomiting occur, drink only clear liquids until the nausea and/or vomiting subsides. Call your physician if vomiting continues.  Special Instructions/Symptoms: Your throat may feel dry or sore from the anesthesia or the breathing tube placed in your throat during surgery. If this causes discomfort, gargle with warm salt water. The discomfort should disappear within 24 hours.

## 2014-03-18 NOTE — Anesthesia Postprocedure Evaluation (Signed)
  Anesthesia Post-op Note  Patient: [name]  Procedure(s) Performed: Procedure(s) (LRB): INSERTION OF GASTRIC NEUROSTIMULATOR PULSE GENERATOR (Right)  Patient Location: PACU  Anesthesia Type: MAC  Level of Consciousness: awake and alert   Airway and Oxygen Therapy: Patient Spontanous Breathing  Post-op Pain: mild  Post-op Assessment: Post-op Vital signs reviewed, Patient's Cardiovascular Status Stable, Respiratory Function Stable, Patent Airway and No signs of Nausea or vomiting  Last Vitals:  Filed Vitals:   03/18/14 1120  BP: 152/93  Pulse: 59  Temp: 36 C  Resp: 14    Post-op Vital Signs: stable   Complications: No apparent anesthesia complications

## 2014-03-18 NOTE — Anesthesia Procedure Notes (Signed)
Procedure Name: MAC Date/Time: 03/18/2014 8:54 AM Performed by: Mechele Claude Pre-anesthesia Checklist: Patient identified, Timeout performed, Emergency Drugs available, Suction available and Patient being monitored Patient Re-evaluated:Patient Re-evaluated prior to inductionOxygen Delivery Method: Simple face mask

## 2014-03-18 NOTE — H&P (View-Only) (Signed)
[name] is a 68 y.o. female who is here for her fecal incontinence. She has failed fiber therapy and uses imodium with some success but continues to have several episodes of leakage throughout the week. She has completed her symptom diary and is ready to proceed with testing for a SNS. She is having at least one episode a day. She actually has a niece with this device for bladder dysfunction and understands the device rather well.   Past Medical History  Diagnosis Date  . Hyperlipidemia   . Coronary artery disease     a.  s/p Xience DES to RCA 04/2009;   b. TEE 2/12: EF 40%, Large PFO;  c.  Lexiscan Myoview 05/2012: EF 69%, no ischemia. LHC (05/2012):  Ostial diagonal 30-40%, proximal mid ramus intermedius 40-50%, RCA stent patent with 40-50% after stent, then 40%, distal RCA 40-50%, EF 55-65%. Medical therapy continued.;  d.  Carlton Adam Myoview (06/2013):  No ischemia, EF 83%, normal study  . Irritable bowel syndrome (IBS)   . History of recurrent UTIs   . S/P drug eluting coronary stent placement   . History of basal cell carcinoma excision     scalp  . PAF (paroxysmal atrial fibrillation)   . S/P ablation of atrial fibrillation   . Chronic diarrhea   . Fecal incontinence   . Hypertension     pt denies  . Diabetes mellitus     pt denies  . Arthritis     osteoarthritis    Past Surgical History  Procedure Laterality Date  . Total abdominal hysterectomy    . Appendectomy    . Cholecystectomy    . Spinal cyst removed    . Carpal tunnel release Right   . Flexible sigmoidoscopy N/A 07/07/2013    Procedure: FLEXIBLE SIGMOIDOSCOPY;  Surgeon: Winfield Cunas., MD;  Location: [facility];  Service: Endoscopy;  Laterality: N/A;  unprepped  . Back surgery      x3  . Rectal ultrasound N/A 01/11/2014    Procedure: RECTAL ULTRASOUND;  Surgeon: Leighton Ruff, MD;  Location: [facility];  Service: Endoscopy;  Laterality: N/A;  . Anal rectal manometry N/A 01/11/2014    Procedure: ANAL  RECTAL MANOMETRY;  Surgeon: Leighton Ruff, MD;  Location: [facility];  Service: Endoscopy;  Laterality: N/A;  . Bunionectomy Bilateral   . Coronary angioplasty with stent placement  04-04-2009  dr Darnell Level brodie    PCI and DES x1 to  mRCA/  mLAD 40%, pCX 30%, pRCA 30%/  normal LVF  . Cardiac catheterization  10-24-2010   dr allred    singl-vessel CAD with patent stent mRCA/  moderate disease mRCA beyond stent segment/  normal lvsf  . Transthoracic echocardiogram  09-19-2011    grade I diastolic dysfunction/  ef 55-60%/  mild TR  . Cardiovascular stress test  06-15-2013  dr allred    normal perfusion study/  no ischemia/  ef 83%  . Cardiac electrophysiology mapping and ablation  08-15-2010  dr allred   Family History  Problem Relation Age of Onset  . Coronary artery disease Father   . Heart disease Sister   . Heart disease Mother    History   Social History  . Marital Status: Married    Spouse Name: N/A    Number of Children: N/A  . Years of Education: N/A   Occupational History  . retired    Social History Main Topics  . Smoking status: Former Smoker -- 1.00 packs/day for 20 years  Quit date: 07/02/1989  . Smokeless tobacco: Never Used  . Alcohol Use: 0.0 oz/week     Comment: wine occasionally  . Drug Use: No  . Sexual Activity: Not on file   Other Topics Concern  . Not on file   Social History Narrative  . No narrative on file    .   Medication List           dabigatran 150 MG Caps capsule  Commonly known as:  PRADAXA  Take 150 mg by mouth 2 (two) times daily.     lisinopril 10 MG tablet  Commonly known as:  PRINIVIL,ZESTRIL  TAKE 1 TABLET DAILY     metoprolol tartrate 25 MG tablet  Commonly known as:  LOPRESSOR  Take 25 mg by mouth 2 (two) times daily.     nitroGLYCERIN 0.4 MG SL tablet  Commonly known as:  NITROSTAT  Place 0.4 mg under the tongue every 5 (five) minutes as needed for chest pain.     rosuvastatin 40 MG tablet  Commonly known as:   CRESTOR  Take 40 mg by mouth daily.     traMADol 50 MG tablet  Commonly known as:  ULTRAM  Take by mouth every 6 (six) hours as needed.     traZODone 50 MG tablet  Commonly known as:  DESYREL  Take 50 mg by mouth at bedtime.      No Known Allergies  Review of Systems - General ROS: negative for - chills or fever Respiratory ROS: no cough, shortness of breath, or wheezing Cardiovascular ROS: no chest pain or dyspnea on exertion Gastrointestinal ROS: no abdominal pain, change in bowel habits, or black or bloody stools Genito-Urinary ROS: no dysuria, trouble voiding, or hematuria Neurological ROS: negative for - bowel and bladder control changes, numbness/tingling or weakness  Filed Vitals:   03/10/14 0856  BP: 156/79  Temp: 97.7 F (36.5 C)  Resp: 18    Physical Exam  Constitutional: She is oriented to person, place, and time and well-developed, well-nourished, and in no distress. No distress.  HENT:  Head: Normocephalic and atraumatic.  Eyes: Conjunctivae are normal. Pupils are equal, round, and reactive to light.  Neck: Normal range of motion. No thyromegaly present.  Cardiovascular: Normal rate and regular rhythm.   Pulmonary/Chest: Effort normal and breath sounds normal.  Abdominal: Soft. Bowel sounds are normal. She exhibits no distension. There is no tenderness.  Musculoskeletal: Normal range of motion.  Neurological: She is alert and oriented to person, place, and time.  Skin: Skin is warm and dry. She is not diaphoretic.   Anal US findings: external sphincter appears intact. Internal sphincter with 180 degree break  Anal Manometry findings: global muscle dysfunction (internal and external sphincters)  Assessment: Fecal Incontinence refractory to medical treatment and inhibiting daily life activities.    Plan: We will insert a SNS test wire today.  She has undergone education via the [employer] representative, education pamphlets and myself.  Risks include  bleeding, pain, dysfunction and infection.  I believe she understands these risks and has agreed to proceed.   She understands that we will return in 2 weeks to implant a permanent device if this helps her symptoms by at least 50%.

## 2014-03-18 NOTE — Anesthesia Preprocedure Evaluation (Addendum)
Anesthesia Evaluation  Patient identified by MRN, date of birth, ID band Patient awake    Reviewed: Allergy & Precautions, H&P , NPO status , Patient's Chart, lab work & pertinent test results  Airway Mallampati: II TM Distance: >3 FB Neck ROM: Full    Dental no notable dental hx.    Pulmonary shortness of breath, pneumonia -, former smoker,  breath sounds clear to auscultation  Pulmonary exam normal       Cardiovascular Exercise Tolerance: Good hypertension, Pt. on medications and Pt. on home beta blockers + angina + CAD and +CHF Rhythm:Regular Rate:Normal  ECHO 2013 reviewed. EF normal. Grade 1 diastolic dysfunction  ECG: Normal  CXR: Chronic bronchitic changes.  Normal myoview December 2014  Denies recent chest pain. Sees Dr. Rayann Heman at least once per year.   Neuro/Psych negative neurological ROS  negative psych ROS   GI/Hepatic negative GI ROS, Neg liver ROS,   Endo/Other  neg diabetesPatient denies h/o diabetes  Renal/GU negative Renal ROS  negative genitourinary   Musculoskeletal  (+) Arthritis -,   Abdominal (+) + obese,   Peds negative pediatric ROS (+)  Hematology negative hematology ROS (+)   Anesthesia Other Findings   Reproductive/Obstetrics negative OB ROS                       Anesthesia Physical Anesthesia Plan  ASA: III  Anesthesia Plan: MAC   Post-op Pain Management:    Induction: Intravenous  Airway Management Planned:   Additional Equipment:   Intra-op Plan:   Post-operative Plan:   Informed Consent: I have reviewed the patients History and Physical, chart, labs and discussed the procedure including the risks, benefits and alternatives for the proposed anesthesia with the patient or authorized representative who has indicated his/her understanding and acceptance.   Dental advisory given  Plan Discussed with: CRNA  Anesthesia Plan Comments:         Anesthesia Quick Evaluation

## 2014-04-15 ENCOUNTER — Other Ambulatory Visit: Payer: Self-pay | Admitting: Physician Assistant

## 2014-04-18 ENCOUNTER — Other Ambulatory Visit: Payer: Self-pay | Admitting: Internal Medicine

## 2014-06-06 ENCOUNTER — Other Ambulatory Visit: Payer: Self-pay | Admitting: Internal Medicine

## 2014-06-10 ENCOUNTER — Encounter (HOSPITAL_COMMUNITY): Payer: Self-pay | Admitting: Cardiology

## 2014-06-18 ENCOUNTER — Other Ambulatory Visit (INDEPENDENT_AMBULATORY_CARE_PROVIDER_SITE_OTHER): Payer: Self-pay | Admitting: General Surgery

## 2014-07-30 ENCOUNTER — Other Ambulatory Visit: Payer: Self-pay | Admitting: Internal Medicine

## 2014-08-06 ENCOUNTER — Telehealth: Payer: Self-pay | Admitting: Internal Medicine

## 2014-08-06 NOTE — Telephone Encounter (Signed)
New Message     Patient is having a procedure done on 09/07/14 and needs to know if she needs to come in for an appt before the procedure (knee replacement)  Please give patient a call back.

## 2014-08-10 NOTE — Telephone Encounter (Signed)
Discussed with Dr Rayann Heman.  He feels she would benefit from a primary cardiologist and will need to be seen first.  She will be set up with a PA and recommendations made at that point off which MD she can follow with on a regular basis and obtain the clearance

## 2014-08-11 NOTE — Telephone Encounter (Signed)
She is scheduled for 08/13/14 with Truitt Merle, NP for clearance

## 2014-08-13 ENCOUNTER — Other Ambulatory Visit: Payer: Self-pay | Admitting: Internal Medicine

## 2014-08-13 ENCOUNTER — Ambulatory Visit (INDEPENDENT_AMBULATORY_CARE_PROVIDER_SITE_OTHER): Payer: Medicare Other | Admitting: Nurse Practitioner

## 2014-08-13 ENCOUNTER — Encounter: Payer: Self-pay | Admitting: Nurse Practitioner

## 2014-08-13 VITALS — BP 122/70 | HR 60 | Ht 63.0 in | Wt 189.2 lb

## 2014-08-13 DIAGNOSIS — Z01818 Encounter for other preprocedural examination: Secondary | ICD-10-CM

## 2014-08-13 NOTE — Progress Notes (Signed)
CARDIOLOGY OFFICE NOTE  Date:  08/13/2014    [name] Date of Birth: 06/09/1946 Medical Record #[id number]  PCP:  [name], MD  Cardiologist:  Allred    Chief Complaint  Patient presents with  . Pre-op Exam    Surgical clearance - seen for Dr. Rayann Heman     History of Present Illness: [name] is a 69 y.o. female who presents today for a surgical clearance visit. She is seen for Dr. Rayann Heman. She has a hx of CAD, status post DES to the RCA 04/2009, paroxysmal atrial fibrillation, status post PVI ablation 08/2010, T2DM, HTN, HL.   Last seen by Dr. Rayann Heman in 08/2013. Saw Richardson Dopp, [place] back in April of 2015 - was doing well. Was having lots of GI issues and was off of her statin.  Called here earlier this month -      Dionicio Stall, RN at 08/10/2014 8:57 AM     Status: Signed       Expand All Collapse All   Discussed with Dr Rayann Heman. He feels she would benefit from a primary cardiologist and will need to be seen first. She will be set up with a PA and recommendations made at that point off which MD she can follow with on a regular basis and obtain the clearance       Thus added to my schedule.   Comes in today. Here with her husband. She is doing well. She has no chest pain. Not short of breath. No dizziness or syncope noted.  Limited activity due to her knee but tries to be as mobile as possible. Her GI issues have totally resolved after seeing Dr. Marcello Moores at [place]. She is back on her statin. She remains on her Pradaxa - no problems noted.    Past Medical History  Diagnosis Date  . Hyperlipidemia   . Coronary artery disease     a.  s/p Xience DES to RCA 04/2009;   b. TEE 2/12: EF 40%, Large PFO;  c.  Lexiscan Myoview 05/2012: EF 69%, no ischemia. LHC (05/2012):  Ostial diagonal 30-40%, proximal mid ramus intermedius 40-50%, RCA stent patent with 40-50% after stent, then 40%, distal RCA 40-50%, EF 55-65%. Medical therapy continued.;  d.  Carlton Adam Myoview  (06/2013):  No ischemia, EF 83%, normal study  . Irritable bowel syndrome (IBS)   . History of recurrent UTIs   . S/P drug eluting coronary stent placement   . History of basal cell carcinoma excision     scalp  . PAF (paroxysmal atrial fibrillation)   . S/P ablation of atrial fibrillation   . Chronic diarrhea   . Fecal incontinence   . Hypertension     pt denies  . Arthritis     osteoarthritis  . Borderline diabetes     Past Surgical History  Procedure Laterality Date  . Carpal tunnel release Right   . Flexible sigmoidoscopy N/A 07/07/2013    Procedure: FLEXIBLE SIGMOIDOSCOPY;  Surgeon: Winfield Cunas., MD;  Location: [facility];  Service: Endoscopy;  Laterality: N/A;  unprepped  . Rectal ultrasound N/A 01/11/2014    Procedure: RECTAL ULTRASOUND;  Surgeon: Leighton Ruff, MD;  Location: [facility];  Service: Endoscopy;  Laterality: N/A;  . Anal rectal manometry N/A 01/11/2014    Procedure: ANAL RECTAL MANOMETRY;  Surgeon: Leighton Ruff, MD;  Location: [facility];  Service: Endoscopy;  Laterality: N/A;  . Bunionectomy Bilateral   . Coronary angioplasty with stent  placement  04-04-2009  dr Darnell Level brodie    PCI and DES x1 to  mRCA/  mLAD 40%, pCX 30%, pRCA 30%/  normal LVF  . Cardiac catheterization  10-24-2010   dr allred    singl-vessel CAD with patent stent mRCA/  moderate disease mRCA beyond stent segment/  normal lvsf  . Transthoracic echocardiogram  09-19-2011    grade I diastolic dysfunction/  ef 55-60%/  mild TR  . Cardiovascular stress test  06-15-2013  dr allred    normal perfusion study/  no ischemia/  ef 83%  . Cardiac electrophysiology mapping and ablation  08-15-2010  dr allred  . Vaginal hysterectomy  1975    W/ BILATERAL SALPINOOPHOORECTOMY  . Cholecystectomy open  [place]  . Pilonidal cyst excision  2009;   2000;   1999  . Vagus nerve stimulator insertion N/A 03/10/2014    Procedure: IMPLANTATION OF SACRAL  NERVE STIMULATOR ;  Surgeon:  Leighton Ruff, MD;  Location: [facility];  Service: General;  Laterality: N/A;  Sacral  . Left heart catheterization with coronary angiogram N/A 05/09/2012    Procedure: LEFT HEART CATHETERIZATION WITH CORONARY ANGIOGRAM;  Surgeon: Hillary Bow, MD;  Location: [facility] CATH LAB;  Service: Cardiovascular;  Laterality: N/A;     Medications: Current Outpatient Prescriptions  Medication Sig Dispense Refill  . lisinopril (PRINIVIL,ZESTRIL) 10 MG tablet TAKE 1 TABLET DAILY 90 tablet 0  . metoprolol succinate (TOPROL-XL) 25 MG 24 hr tablet Take 25 mg by mouth daily.    . nitroGLYCERIN (NITROSTAT) 0.4 MG SL tablet Place 0.4 mg under the tongue every 5 (five) minutes as needed for chest pain.    [name] PRADAXA 150 MG CAPS capsule TAKE 1 CAPSULE EVERY 12 HOURS (NEED TO CONTACT OFFICE TO SCHEDULE APPOINTMENT FOR FUTURE REFILLS, [phone]) 30 capsule 0  . rosuvastatin (CRESTOR) 40 MG tablet Take 40 mg by mouth daily.    . traMADol (ULTRAM) 50 MG tablet Take 1 tablet (50 mg total) by mouth every 6 (six) hours as needed. 20 tablet 0  . traZODone (DESYREL) 50 MG tablet Take 50 mg by mouth at bedtime.     No current facility-administered medications for this visit.    Allergies: Allergies  Allergen Reactions  . Cephalexin Nausea Only    Social History: The patient  reports that she quit smoking about 25 years ago. She has never used smokeless tobacco. She reports that she drinks alcohol. She reports that she does not use illicit drugs.   Family History: The patient's family history includes Coronary artery disease in her father; Heart disease in her mother and sister.   Review of Systems: Please see the history of present illness.   All other systems are reviewed and negative.   Physical Exam: VS:  BP 122/70 mmHg  Pulse 60  Ht 5\' 3"  (1.6 m)  Wt 189 lb 4 oz (85.843 kg)  BMI 33.53 kg/m2 .  BMI Body mass index is 33.53 kg/(m^2).  Wt Readings from Last 3 Encounters:  08/13/14 189 lb  4 oz (85.843 kg)  03/18/14 189 lb 8 oz (85.957 kg)  03/10/14 188 lb (85.276 kg)    General: Pleasant. Well developed, well nourished and in no acute distress.  HEENT: Normal. Neck: Supple, no JVD, carotid bruits, or masses noted.  Cardiac: Regular rate and rhythm. No murmurs, rubs, or gallops. No edema.  Respiratory:  Lungs are clear to auscultation bilaterally with normal work of breathing.  GI: Soft and nontender.  MS: No deformity or atrophy. Gait and ROM intact. Skin: Warm and dry. Color is normal.  Neuro:  Strength and sensation are intact and no gross focal deficits noted.  Psych: Alert, appropriate and with normal affect.   LABORATORY DATA:  EKG:  EKG is ordered today. This demonstrates NSR - she has septal Q's with T wave inversion. This is reviewed with Dr. Harrington Challenger - not felt to be significant when compared to prior tracings.   Lab Results  Component Value Date   WBC 13.3* 07/15/2013   HGB 12.6 03/10/2014   HCT 37.0 03/10/2014   PLT 158 07/15/2013   GLUCOSE 108* 03/10/2014   CHOL * 04/01/2009    218        ATP III CLASSIFICATION:  <200     mg/dL   Desirable  200-239  mg/dL   Borderline High  >=240    mg/dL   High          TRIG 295* 04/01/2009   HDL 33* 04/01/2009   LDLCALC * 04/01/2009    126        Total Cholesterol/HDL:CHD Risk Coronary Heart Disease Risk Table                     Men   Women  1/2 Average Risk   3.4   3.3  Average Risk       5.0   4.4  2 X Average Risk   9.6   7.1  3 X Average Risk  23.4   11.0        Use the calculated Patient Ratio above and the CHD Risk Table to determine the patient's CHD Risk.        ATP III CLASSIFICATION (LDL):  <100     mg/dL   Optimal  100-129  mg/dL   Near or Above                    Optimal  130-159  mg/dL   Borderline  160-189  mg/dL   High  >190     mg/dL   Very High   ALT 12 07/15/2013   AST 17 07/15/2013   NA 139 03/10/2014   K 3.6* 03/10/2014   CL 101 07/15/2013   CREATININE 1.33* 07/15/2013    BUN 17 07/15/2013   CO2 26 07/15/2013   TSH 2.657 07/06/2013   INR 1.49 07/05/2013   HGBA1C 5.9* 07/06/2013    BNP (last 3 results) No results for input(s): BNP in the last 8760 hours.  ProBNP (last 3 results) No results for input(s): PROBNP in the last 8760 hours.   Other Studies Reviewed Today:  - Echo (08/2011): EF 76-28%, grade 1 diastolic dysfunction.  - Event Monitor (06/2013): NSR, No AFib.  - Myoview (06/2013): No ischemia, EF 83%. - LHC (05/2012): Ostial Dx 30-40%, proximal mid RI 40-50%, RCA stent patent with 40-50% after stent, then 40%, distal RCA 40-50%, EF 55-65%. Medical Rx   Assessment/Plan: 1. Pre op clearance - wanting to have TKR with Dr. Alvan Dame -  she is doing well clinically with no symptoms. EKG is reviewed with Dr. Harrington Challenger - We both feel that she is stable from our standpoint. Last cath from 2013 stable as well as her last Myoview from 06/2013 - will be available if problems arise.   2. PAF - remains in sinus.   3. CAD - remote Xience DES to the mid RCA back in  2010, last cath in 2013 - medical management.  4. HLD -  Back on statin therapy.  5. Chronic anticoagulation - on Pradaxa - no problems noted - would recommend she hold for 48 hours prior to surgery and then resume post op.   Current medicines are reviewed with the patient today.  The patient does not have concerns regarding medicines other than what has been noted above.  The following changes have been made:  See above.  Labs/ tests ordered today include:    Orders Placed This Encounter  Procedures  . EKG 12-Lead   Disposition:   Will arrange follow up with Dr. Angelena Form for general cardiology in 6 months.   Patient is agreeable to this plan and will call if any problems develop in the interim.   Signed: Burtis Junes, RN, ANP-C 08/13/2014 3:58 PM  [facility] [address] [place], [place]  [address] Phone: [phone] Fax: [phone]

## 2014-08-13 NOTE — Patient Instructions (Signed)
I will send a note to Dr. Alvan Dame  Stop Pradaxa 48 hours prior to surgery  See Dr. Angelena Form in 6 months  Call the [place] office at [phone] if you have any questions, problems or concerns.

## 2014-08-20 ENCOUNTER — Encounter: Payer: Self-pay | Admitting: Nurse Practitioner

## 2014-08-20 ENCOUNTER — Other Ambulatory Visit: Payer: Self-pay

## 2014-08-20 ENCOUNTER — Other Ambulatory Visit: Payer: Self-pay | Admitting: Internal Medicine

## 2014-08-20 MED ORDER — DABIGATRAN ETEXILATE MESYLATE 150 MG PO CAPS: 150.0000 mg | ORAL_CAPSULE | Freq: Two times a day (BID) | ORAL | Status: DC

## 2014-08-26 NOTE — H&P (Signed)
TOTAL KNEE ADMISSION H&P  Patient is being admitted for right total knee arthroplasty.  Subjective:  Chief Complaint:    Right knee primary OA / pain  HPI: [name], 69 y.o. female, has a history of pain and functional disability in the right knee due to arthritis and has failed non-surgical conservative treatments for greater than 12 weeks to include NSAID's and/or analgesics, corticosteriod injections, viscosupplementation injections and activity modification.  Onset of symptoms was gradual, starting 15+ years ago with gradually worsening course since that time. The patient noted prior procedures on the knee to include  arthroscopy on the right knee(s).  Patient currently rates pain in the right knee(s) at 9 out of 10 with activity. Patient has worsening of pain with activity and weight bearing, pain that interferes with activities of daily living, pain with passive range of motion, crepitus and joint swelling.  Patient has evidence of periarticular osteophytes and joint space narrowing by imaging studies.  There is no active infection.  Risks, benefits and expectations were discussed with the patient.  Risks including but not limited to the risk of anesthesia, blood clots, nerve damage, blood vessel damage, failure of the prosthesis, infection and up to and including death.  Patient understand the risks, benefits and expectations and wishes to proceed with surgery.   PCP: Renee Gathers, MD  D/C Plans:      Home with HHPT  Post-op Meds:       No Rx given  Tranexamic Acid:      To be given - IV   Decadron:      is to be given  FYI:     On Pradaxa pre-op   Norco post-op   Patient Active Problem List   Diagnosis Date Noted  . CHF (congestive heart failure) 07/15/2013  . Precordial pain 10/19/2010  . EDEMA 12/16/2009  . Atrial fibrillation 08/01/2009  . PULMONARY NODULE, LEFT LOWER LOBE 04/28/2009  . DIABETES MELLITUS, TYPE II 04/27/2009  . 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 SKIN CANCER, HX OF 04/27/2009  . HYPERLIPIDEMIA-MIXED 04/11/2009  . CAD, NATIVE VESSEL 04/11/2009  . DYSPNEA 04/11/2009  . HEARTBURN 04/11/2009  . Essential hypertension, benign 04/01/2009  . ANGINA, UNSTABLE 04/01/2009   Past Medical History  Diagnosis Date  . Hyperlipidemia   . Coronary artery disease     a.  s/p Xience DES to RCA 04/2009;   b. TEE 2/12: EF 40%, Large PFO;  c.  Lexiscan Myoview 05/2012: EF 69%, no ischemia. LHC (05/2012):  Ostial diagonal 30-40%, proximal mid ramus intermedius 40-50%, RCA stent patent with 40-50% after stent, then 40%, distal RCA 40-50%, EF 55-65%. Medical therapy continued.;  d.  Carlton Adam Myoview (06/2013):  No ischemia, EF 83%, normal study  . Irritable bowel syndrome (IBS)   . History of recurrent UTIs   . S/P drug eluting coronary stent placement   . History of basal cell carcinoma excision     scalp  . PAF (paroxysmal atrial fibrillation)   . S/P ablation of atrial fibrillation   . Chronic diarrhea   . Fecal incontinence   . Hypertension     pt denies  . Arthritis     osteoarthritis  . Borderline diabetes     Past Surgical History  Procedure Laterality Date  . Carpal tunnel release Right   . Flexible sigmoidoscopy N/A 07/07/2013    Procedure: FLEXIBLE SIGMOIDOSCOPY;  Surgeon: Winfield Cunas., MD;  Location: [facility];  Service: Endoscopy;  Laterality: N/A;  unprepped  . Rectal ultrasound N/A 01/11/2014    Procedure: RECTAL ULTRASOUND;  Surgeon: Leighton Ruff, MD;  Location: [facility];  Service: Endoscopy;  Laterality: N/A;  . Anal rectal manometry N/A 01/11/2014    Procedure: ANAL RECTAL MANOMETRY;  Surgeon: Leighton Ruff, MD;  Location: [facility];  Service: Endoscopy;  Laterality: N/A;  . Bunionectomy Bilateral   . Coronary angioplasty with stent placement  04-04-2009  dr Darnell Level brodie    PCI and DES x1 to  mRCA/  mLAD 40%, pCX 30%, pRCA 30%/  normal LVF  . Cardiac catheterization  10-24-2010   dr  allred    singl-vessel CAD with patent stent mRCA/  moderate disease mRCA beyond stent segment/  normal lvsf  . Transthoracic echocardiogram  09-19-2011    grade I diastolic dysfunction/  ef 55-60%/  mild TR  . Cardiovascular stress test  06-15-2013  dr allred    normal perfusion study/  no ischemia/  ef 83%  . Cardiac electrophysiology mapping and ablation  08-15-2010  dr allred  . Vaginal hysterectomy  1975    W/ BILATERAL SALPINOOPHOORECTOMY  . Cholecystectomy open  [place]  . Pilonidal cyst excision  2009;   2000;   1999  . Vagus nerve stimulator insertion N/A 03/10/2014    Procedure: IMPLANTATION OF SACRAL  NERVE STIMULATOR ;  Surgeon: Leighton Ruff, MD;  Location: [facility];  Service: General;  Laterality: N/A;  Sacral  . Left heart catheterization with coronary angiogram N/A 05/09/2012    Procedure: LEFT HEART CATHETERIZATION WITH CORONARY ANGIOGRAM;  Surgeon: Hillary Bow, MD;  Location: [facility] CATH LAB;  Service: Cardiovascular;  Laterality: N/A;    No prescriptions prior to admission   Allergies  Allergen Reactions  . Cephalexin Nausea Only    History  Substance Use Topics  . Smoking status: Former Smoker -- 1.00 packs/day for 20 years    Quit date: 07/02/1989  . Smokeless tobacco: Never Used  . Alcohol Use: 0.0 oz/week     Comment: wine occasionally    Family History  Problem Relation Age of Onset  . Coronary artery disease Father   . Heart disease Sister   . Heart disease Mother      Review of Systems  Constitutional: Negative.   HENT: Negative.   Eyes: Negative.   Respiratory: Negative.   Cardiovascular: Negative.   Gastrointestinal: Positive for heartburn and diarrhea.  Genitourinary: Negative.   Musculoskeletal: Positive for joint pain.  Skin: Negative.   Neurological: Negative.   Endo/Heme/Allergies: Negative.   Psychiatric/Behavioral: Negative.     Objective:  Physical Exam  Constitutional: She is oriented to  person, place, and time. She appears well-developed and well-nourished.  HENT:  Head: Normocephalic and atraumatic.  Eyes: Pupils are equal, round, and reactive to light.  Neck: Neck supple. No JVD present. No tracheal deviation present. No thyromegaly present.  Cardiovascular: Normal rate, regular rhythm, normal heart sounds and intact distal pulses.   Respiratory: Effort normal and breath sounds normal. No stridor. No respiratory distress. She has no wheezes.  GI: Soft. There is no tenderness. There is no guarding.  Musculoskeletal:       Right knee: She exhibits decreased range of motion, swelling and bony tenderness. She exhibits no ecchymosis, no deformity, no laceration and no erythema. Tenderness found.  Lymphadenopathy:    She has no cervical adenopathy.  Neurological: She is alert and oriented to person, place, and time.  Skin: Skin is warm  and dry.  Psychiatric: She has a normal mood and affect.      Labs:  Estimated body mass index is 34.65 kg/(m^2) as calculated from the following:   Height as of 03/04/14: 5\' 2"  (1.575 m).   Weight as of 03/18/14: 85.957 kg (189 lb 8 oz).   Imaging Review Plain radiographs demonstrate severe degenerative joint disease of the right knee(s). The overall alignment is  neutral. The bone quality appears to be good for age and reported activity level.  Assessment/Plan:  End stage arthritis, right knee   The patient history, physical examination, clinical judgment of the provider and imaging studies are consistent with end stage degenerative joint disease of the right knee(s) and total knee arthroplasty is deemed medically necessary. The treatment options including medical management, injection therapy arthroscopy and arthroplasty were discussed at length. The risks and benefits of total knee arthroplasty were presented and reviewed. The risks due to aseptic loosening, infection, stiffness, patella tracking problems, thromboembolic complications  and other imponderables were discussed. The patient acknowledged the explanation, agreed to proceed with the plan and consent was signed. Patient is being admitted for inpatient treatment for surgery, pain control, PT, OT, prophylactic antibiotics, VTE prophylaxis, progressive ambulation and ADL's and discharge planning. The patient is planning to be discharged home with home health services.    [place] Geraldyn Shain   PA-C  08/26/2014, 5:36 PM

## 2014-08-27 NOTE — Patient Instructions (Addendum)
[name]  08/27/2014   Your procedure is scheduled on: 09/07/2014    Report to [facility] Main  Entrance and follow signs to               [place] at     1245pm  Call this number if you have problems the morning of surgery [phone]   Remember:  Do not eat food after midnight.  May have clear liquids until 0830am then nothing by mouth.       Take these medicines the morning of surgery with A SIP OF WATER: toprol                               You may not have any metal on your body including hair pins and              piercings  Do not wear jewelry, make-up, lotions, powders or perfumes., deodorant.                Do not wear nail polish.  Do not shave  48 hours prior to surgery.   Do not bring valuables to the hospital. [place].  Contacts, dentures or bridgework may not be worn into surgery.  Leave suitcase in the car. After surgery it may be brought to your room.               Please read over the following fact sheets you were given: MRSA information  _____________________________________________________________________                CLEAR LIQUID DIET   Foods Allowed                                                                     Foods Excluded  Coffee and tea, regular and decaf                             liquids that you cannot  Plain Jell-O in any flavor                                             see through such as: Fruit ices (not with fruit pulp)                                     milk, soups, orange juice  Iced Popsicles                                    All solid food Carbonated beverages, regular and diet  Cranberry, grape and apple juices Sports drinks like Gatorade Lightly seasoned clear broth or consume(fat free) Sugar, honey syrup  Sample Menu Breakfast                                Lunch                                      Supper Cranberry juice                    Beef broth                            Chicken broth Jell-O                                     Grape juice                           Apple juice Coffee or tea                        Jell-O                                      Popsicle                                                Coffee or tea                        Coffee or tea  _____________________________________________________________________  [facility] - Preparing for Surgery Before surgery, you can play an important role.  Because skin is not sterile, your skin needs to be as free of germs as possible.  You can reduce the number of germs on your skin by washing with CHG (chlorahexidine gluconate) soap before surgery.  CHG is an antiseptic cleaner which kills germs and bonds with the skin to continue killing germs even after washing. Please DO NOT use if you have an allergy to CHG or antibacterial soaps.  If your skin becomes reddened/irritated stop using the CHG and inform your nurse when you arrive at Short Stay. Do not shave (including legs and underarms) for at least 48 hours prior to the first CHG shower.  You may shave your face/neck. Please follow these instructions carefully:  1.  Shower with CHG Soap the night before surgery and the  morning of Surgery.  2.  If you choose to wash your hair, wash your hair first as usual with your  normal  shampoo.  3.  After you shampoo, rinse your hair and body thoroughly to remove the  shampoo.                            4.  Use CHG as you would any other liquid soap.  You can apply chg directly  to the skin and wash  Gently with a scrungie or clean washcloth.  5.  Apply the CHG Soap to your body ONLY FROM THE NECK DOWN.   Do not use on face/ open                           Wound or open sores. Avoid contact with eyes, ears mouth and genitals (private parts).                       Wash face,  Genitals (private parts) with your  normal soap.             6.  Wash thoroughly, paying special attention to the area where your surgery  will be performed.  7.  Thoroughly rinse your body with warm water from the neck down.  8.  DO NOT shower/wash with your normal soap after using and rinsing off  the CHG Soap.                9.  Pat yourself dry with a clean towel.            10.  Wear clean pajamas.            11.  Place clean sheets on your bed the night of your first shower and do not  sleep with pets. Day of Surgery : Do not apply any lotions/deodorants the morning of surgery.  Please wear clean clothes to the hospital/surgery center.  FAILURE TO FOLLOW THESE INSTRUCTIONS MAY RESULT IN THE CANCELLATION OF YOUR SURGERY PATIENT SIGNATURE_________________________________  NURSE SIGNATURE__________________________________  ________________________________________________________________________  WHAT IS A BLOOD TRANSFUSION? Blood Transfusion Information  A transfusion is the replacement of blood or some of its parts. Blood is made up of multiple cells which provide different functions.  Red blood cells carry oxygen and are used for blood loss replacement.  White blood cells fight against infection.  Platelets control bleeding.  Plasma helps clot blood.  Other blood products are available for specialized needs, such as hemophilia or other clotting disorders. BEFORE THE TRANSFUSION  Who gives blood for transfusions?   Healthy volunteers who are fully evaluated to make sure their blood is safe. This is blood bank blood. Transfusion therapy is the safest it has ever been in the practice of medicine. Before blood is taken from a donor, a complete history is taken to make sure that person has no history of diseases nor engages in risky social behavior (examples are intravenous drug use or sexual activity with multiple partners). The donor's travel history is screened to minimize risk of transmitting infections, such as  malaria. The donated blood is tested for signs of infectious diseases, such as HIV and hepatitis. The blood is then tested to be sure it is compatible with you in order to minimize the chance of a transfusion reaction. If you or a relative donates blood, this is often done in anticipation of surgery and is not appropriate for emergency situations. It takes many days to process the donated blood. RISKS AND COMPLICATIONS Although transfusion therapy is very safe and saves many lives, the main dangers of transfusion include:  1. Getting an infectious disease. 2. Developing a transfusion reaction. This is an allergic reaction to something in the blood you were given. Every precaution is taken to prevent this. The decision to have a blood transfusion has been considered carefully by your caregiver before blood is given. Blood is not given unless the benefits  outweigh the risks. AFTER THE TRANSFUSION  Right after receiving a blood transfusion, you will usually feel much better and more energetic. This is especially true if your red blood cells have gotten low (anemic). The transfusion raises the level of the red blood cells which carry oxygen, and this usually causes an energy increase.  The nurse administering the transfusion will monitor you carefully for complications. HOME CARE INSTRUCTIONS  No special instructions are needed after a transfusion. You may find your energy is better. Speak with your caregiver about any limitations on activity for underlying diseases you may have. SEEK MEDICAL CARE IF:   Your condition is not improving after your transfusion.  You develop redness or irritation at the intravenous (IV) site. SEEK IMMEDIATE MEDICAL CARE IF:  Any of the following symptoms occur over the next 12 hours:  Shaking chills.  You have a temperature by mouth above 102 F (38.9 C), not controlled by medicine.  Chest, back, or muscle pain.  People around you feel you are not acting  correctly or are confused.  Shortness of breath or difficulty breathing.  Dizziness and fainting.  You get a rash or develop hives.  You have a decrease in urine output.  Your urine turns a dark color or changes to pink, red, or brown. Any of the following symptoms occur over the next 10 days:  You have a temperature by mouth above 102 F (38.9 C), not controlled by medicine.  Shortness of breath.  Weakness after normal activity.  The white part of the eye turns yellow (jaundice).  You have a decrease in the amount of urine or are urinating less often.  Your urine turns a dark color or changes to pink, red, or brown. Document Released: 06/15/2000 Document Revised: 09/10/2011 Document Reviewed: 02/02/2008 Exit[employer] Patient Information 2014 [place].  _______________________________________________________________________  Incentive Spirometer  An incentive spirometer is a tool that can help keep your lungs clear and active. This tool measures how well you are filling your lungs with each breath. Taking long deep breaths may help reverse or decrease the chance of developing breathing (pulmonary) problems (especially infection) following:  A long period of time when you are unable to move or be active. BEFORE THE PROCEDURE   If the spirometer includes an indicator to show your best effort, your nurse or respiratory therapist will set it to a desired goal.  If possible, sit up straight or lean slightly forward. Try not to slouch.  Hold the incentive spirometer in an upright position. INSTRUCTIONS FOR USE  3. Sit on the edge of your bed if possible, or sit up as far as you can in bed or on a chair. 4. Hold the incentive spirometer in an upright position. 5. Breathe out normally. 6. Place the mouthpiece in your mouth and seal your lips tightly around it. 7. Breathe in slowly and as deeply as possible, raising the piston or the ball toward the top of the column. 8. Hold  your breath for 3-5 seconds or for as long as possible. Allow the piston or ball to fall to the bottom of the column. 9. Remove the mouthpiece from your mouth and breathe out normally. 10. Rest for a few seconds and repeat Steps 1 through 7 at least 10 times every 1-2 hours when you are awake. Take your time and take a few normal breaths between deep breaths. 11. The spirometer may include an indicator to show your best effort. Use the indicator as a goal to  work toward during each repetition. 12. After each set of 10 deep breaths, practice coughing to be sure your lungs are clear. If you have an incision (the cut made at the time of surgery), support your incision when coughing by placing a pillow or rolled up towels firmly against it. Once you are able to get out of bed, walk around indoors and cough well. You may stop using the incentive spirometer when instructed by your caregiver.  RISKS AND COMPLICATIONS  Take your time so you do not get dizzy or light-headed.  If you are in pain, you may need to take or ask for pain medication before doing incentive spirometry. It is harder to take a deep breath if you are having pain. AFTER USE  Rest and breathe slowly and easily.  It can be helpful to keep track of a log of your progress. Your caregiver can provide you with a simple table to help with this. If you are using the spirometer at home, follow these instructions: [place] IF:   You are having difficultly using the spirometer.  You have trouble using the spirometer as often as instructed.  Your pain medication is not giving enough relief while using the spirometer.  You develop fever of 100.5 F (38.1 C) or higher. SEEK IMMEDIATE MEDICAL CARE IF:   You cough up bloody sputum that had not been present before.  You develop fever of 102 F (38.9 C) or greater.  You develop worsening pain at or near the incision site. MAKE SURE YOU:   Understand these instructions.  Will  watch your condition.  Will get help right away if you are not doing well or get worse. Document Released: 10/29/2006 Document Revised: 09/10/2011 Document Reviewed: 12/30/2006 [facility] Patient Information 2014 [place], [place].   ________________________________________________________________________

## 2014-08-30 ENCOUNTER — Encounter (HOSPITAL_COMMUNITY): Payer: Self-pay

## 2014-08-30 ENCOUNTER — Encounter (HOSPITAL_COMMUNITY)
Admission: RE | Admit: 2014-08-30 | Discharge: 2014-08-30 | Disposition: A | Discharge status: Home / Self Care | Payer: Medicare Other | Source: Ambulatory Visit | Attending: Orthopedic Surgery | Admitting: Orthopedic Surgery

## 2014-08-30 DIAGNOSIS — M179 Osteoarthritis of knee, unspecified: Secondary | ICD-10-CM | POA: Insufficient documentation

## 2014-08-30 DIAGNOSIS — Z01818 Encounter for other preprocedural examination: Secondary | ICD-10-CM | POA: Diagnosis not present

## 2014-08-30 HISTORY — DX: Gastro-esophageal reflux disease without esophagitis: K21.9

## 2014-08-30 HISTORY — DX: Personal history of urinary calculi: Z87.442

## 2014-08-30 LAB — CBC
HCT: 40.2 % (ref 36.0–46.0)
Hemoglobin: 12.7 g/dL (ref 12.0–15.0)
MCH: 31.1 pg (ref 26.0–34.0)
MCHC: 31.6 g/dL (ref 30.0–36.0)
MCV: 98.3 fL (ref 78.0–100.0)
PLATELETS: 158 10*3/uL (ref 150–400)
RBC: 4.09 MIL/uL (ref 3.87–5.11)
RDW: 12.7 % (ref 11.5–15.5)
WBC: 5.2 10*3/uL (ref 4.0–10.5)

## 2014-08-30 LAB — BASIC METABOLIC PANEL
Anion gap: 5 (ref 5–15)
BUN: 15 mg/dL (ref 6–23)
CHLORIDE: 107 mmol/L (ref 96–112)
CO2: 28 mmol/L (ref 19–32)
CREATININE: 0.97 mg/dL (ref 0.50–1.10)
Calcium: 9.4 mg/dL (ref 8.4–10.5)
GFR calc Af Amer: 67 mL/min — ABNORMAL LOW (ref >90)
GFR calc non Af Amer: 58 mL/min — ABNORMAL LOW (ref >90)
Glucose, Bld: 103 mg/dL — ABNORMAL HIGH (ref 70–99)
Potassium: 3.9 mmol/L (ref 3.5–5.1)
Sodium: 140 mmol/L (ref 135–145)

## 2014-08-30 LAB — URINALYSIS, ROUTINE W REFLEX MICROSCOPIC
Bilirubin Urine: NEGATIVE
GLUCOSE, UA: NEGATIVE mg/dL
HGB URINE DIPSTICK: NEGATIVE
Ketones, ur: NEGATIVE mg/dL
Nitrite: NEGATIVE
Protein, ur: NEGATIVE mg/dL
SPECIFIC GRAVITY, URINE: 1.017 (ref 1.005–1.030)
Urobilinogen, UA: 0.2 mg/dL (ref 0.0–1.0)
pH: 5 (ref 5.0–8.0)

## 2014-08-30 LAB — URINE MICROSCOPIC-ADD ON

## 2014-08-30 LAB — PROTIME-INR
INR: 1.51 — AB (ref 0.00–1.49)
PROTHROMBIN TIME: 18.3 s — AB (ref 11.6–15.2)

## 2014-08-30 LAB — APTT: aPTT: 50 seconds — ABNORMAL HIGH (ref 24–37)

## 2014-08-30 LAB — SURGICAL PCR SCREEN
MRSA, PCR: NEGATIVE
STAPHYLOCOCCUS AUREUS: NEGATIVE

## 2014-08-30 NOTE — Progress Notes (Signed)
VAGUS NERVE STIMULATOR in place. Pt agreed to bring in remote for stimulator on day of surgery.

## 2014-08-30 NOTE — Progress Notes (Signed)
Surgery clearance note and LOV note 08/13/14 Truitt Merle NP on chart, EKG 08/13/14 on EPIC, stress test 06/16/13 on EPIC

## 2014-08-30 NOTE — Progress Notes (Signed)
U/A and micro results done 08/30/2014 faxed via EPIC to Dr Alvan Dame.

## 2014-08-30 NOTE — Progress Notes (Signed)
PT/INR and PTT results from 08/30/2014 faxed via EPIC to Dr Alvan Dame.  PT/INR will be drawn am of surgery.

## 2014-09-07 ENCOUNTER — Inpatient Hospital Stay (HOSPITAL_COMMUNITY): Payer: Medicare Other | Admitting: Certified Registered Nurse Anesthetist

## 2014-09-07 ENCOUNTER — Encounter (HOSPITAL_COMMUNITY)
Admission: RE | Disposition: A | Discharge status: Home / Self Care | Payer: Self-pay | Source: Ambulatory Visit | Attending: Orthopedic Surgery

## 2014-09-07 ENCOUNTER — Encounter (HOSPITAL_COMMUNITY): Payer: Self-pay | Admitting: *Deleted

## 2014-09-07 ENCOUNTER — Inpatient Hospital Stay (HOSPITAL_COMMUNITY)
Admission: RE | Admit: 2014-09-07 | Discharge: 2014-09-08 | DRG: 470 | Disposition: A | Discharge status: Home Health | Payer: Medicare Other | Source: Ambulatory Visit | Attending: Orthopedic Surgery | Admitting: Orthopedic Surgery

## 2014-09-07 DIAGNOSIS — I509 Heart failure, unspecified: Secondary | ICD-10-CM | POA: Diagnosis not present

## 2014-09-07 DIAGNOSIS — Z6833 Body mass index (BMI) 33.0-33.9, adult: Secondary | ICD-10-CM | POA: Diagnosis not present

## 2014-09-07 DIAGNOSIS — Z96652 Presence of left artificial knee joint: Secondary | ICD-10-CM

## 2014-09-07 DIAGNOSIS — E119 Type 2 diabetes mellitus without complications: Secondary | ICD-10-CM | POA: Diagnosis present

## 2014-09-07 DIAGNOSIS — K589 Irritable bowel syndrome without diarrhea: Secondary | ICD-10-CM | POA: Diagnosis present

## 2014-09-07 DIAGNOSIS — K529 Noninfective gastroenteritis and colitis, unspecified: Secondary | ICD-10-CM | POA: Diagnosis present

## 2014-09-07 DIAGNOSIS — Z85828 Personal history of other malignant neoplasm of skin: Secondary | ICD-10-CM

## 2014-09-07 DIAGNOSIS — Z87891 Personal history of nicotine dependence: Secondary | ICD-10-CM | POA: Diagnosis not present

## 2014-09-07 DIAGNOSIS — M1711 Unilateral primary osteoarthritis, right knee: Secondary | ICD-10-CM | POA: Diagnosis not present

## 2014-09-07 DIAGNOSIS — Z87440 Personal history of urinary (tract) infections: Secondary | ICD-10-CM

## 2014-09-07 DIAGNOSIS — Z90710 Acquired absence of both cervix and uterus: Secondary | ICD-10-CM

## 2014-09-07 DIAGNOSIS — E785 Hyperlipidemia, unspecified: Secondary | ICD-10-CM | POA: Diagnosis present

## 2014-09-07 DIAGNOSIS — M25561 Pain in right knee: Secondary | ICD-10-CM | POA: Diagnosis present

## 2014-09-07 DIAGNOSIS — I10 Essential (primary) hypertension: Secondary | ICD-10-CM | POA: Diagnosis not present

## 2014-09-07 DIAGNOSIS — I2510 Atherosclerotic heart disease of native coronary artery without angina pectoris: Secondary | ICD-10-CM | POA: Diagnosis present

## 2014-09-07 DIAGNOSIS — Z96651 Presence of right artificial knee joint: Secondary | ICD-10-CM

## 2014-09-07 DIAGNOSIS — M659 Synovitis and tenosynovitis, unspecified: Secondary | ICD-10-CM | POA: Diagnosis present

## 2014-09-07 DIAGNOSIS — I480 Paroxysmal atrial fibrillation: Secondary | ICD-10-CM | POA: Diagnosis present

## 2014-09-07 DIAGNOSIS — Z9049 Acquired absence of other specified parts of digestive tract: Secondary | ICD-10-CM | POA: Diagnosis present

## 2014-09-07 DIAGNOSIS — Z96659 Presence of unspecified artificial knee joint: Secondary | ICD-10-CM

## 2014-09-07 DIAGNOSIS — Z888 Allergy status to other drugs, medicaments and biological substances status: Secondary | ICD-10-CM | POA: Diagnosis not present

## 2014-09-07 DIAGNOSIS — Z955 Presence of coronary angioplasty implant and graft: Secondary | ICD-10-CM

## 2014-09-07 DIAGNOSIS — E669 Obesity, unspecified: Secondary | ICD-10-CM | POA: Diagnosis present

## 2014-09-07 HISTORY — PX: TOTAL KNEE ARTHROPLASTY: SHX125

## 2014-09-07 LAB — PROTIME-INR
INR: 1.09 (ref 0.00–1.49)
Prothrombin Time: 14.2 seconds (ref 11.6–15.2)

## 2014-09-07 LAB — TYPE AND SCREEN
ABO/RH(D): A POS
ANTIBODY SCREEN: NEGATIVE

## 2014-09-07 SURGERY — ARTHROPLASTY, KNEE, TOTAL
Anesthesia: Spinal | Site: Knee | Laterality: Right

## 2014-09-07 MED ORDER — PROMETHAZINE HCL 25 MG/ML IJ SOLN: 6.2500 mg | INTRAMUSCULAR | Status: DC | PRN

## 2014-09-07 MED ORDER — DOCUSATE SODIUM 100 MG PO CAPS
100.0000 mg | ORAL_CAPSULE | Freq: Two times a day (BID) | ORAL | Status: DC
  Administered 2014-09-07 – 2014-09-08 (×2): 100 mg via ORAL

## 2014-09-07 MED ORDER — FERROUS SULFATE 325 (65 FE) MG PO TABS
325.0000 mg | ORAL_TABLET | Freq: Three times a day (TID) | ORAL | Status: DC
  Administered 2014-09-08: 325 mg via ORAL
  Filled 2014-09-07 (×5): qty 1

## 2014-09-07 MED ORDER — DABIGATRAN ETEXILATE MESYLATE 150 MG PO CAPS
150.0000 mg | ORAL_CAPSULE | Freq: Two times a day (BID) | ORAL | Status: DC
  Administered 2014-09-08: 150 mg via ORAL
  Filled 2014-09-07 (×3): qty 1

## 2014-09-07 MED ORDER — ALUM & MAG HYDROXIDE-SIMETH 200-200-20 MG/5ML PO SUSP: 30.0000 mL | ORAL | Status: DC | PRN

## 2014-09-07 MED ORDER — ONDANSETRON HCL 4 MG/2ML IJ SOLN: 4.0000 mg | Freq: Four times a day (QID) | INTRAMUSCULAR | Status: DC | PRN

## 2014-09-07 MED ORDER — CEFAZOLIN SODIUM-DEXTROSE 2-3 GM-% IV SOLR
2.0000 g | INTRAVENOUS | Status: AC
  Administered 2014-09-07: 2 g via INTRAVENOUS

## 2014-09-07 MED ORDER — PROPOFOL 10 MG/ML IV BOLUS
INTRAVENOUS | Status: AC
  Filled 2014-09-07: qty 20

## 2014-09-07 MED ORDER — FENTANYL CITRATE 0.05 MG/ML IJ SOLN
INTRAMUSCULAR | Status: AC
  Filled 2014-09-07: qty 5

## 2014-09-07 MED ORDER — FENTANYL CITRATE 0.05 MG/ML IJ SOLN
INTRAMUSCULAR | Status: DC | PRN
  Administered 2014-09-07: 50 ug via INTRAVENOUS

## 2014-09-07 MED ORDER — SODIUM CHLORIDE 0.9 % IV SOLN
1000.0000 mg | Freq: Once | INTRAVENOUS | Status: DC
  Filled 2014-09-07: qty 10

## 2014-09-07 MED ORDER — MIDAZOLAM HCL 5 MG/5ML IJ SOLN
INTRAMUSCULAR | Status: DC | PRN
  Administered 2014-09-07 (×2): 1 mg via INTRAVENOUS

## 2014-09-07 MED ORDER — ONDANSETRON HCL 4 MG PO TABS: 4.0000 mg | ORAL_TABLET | Freq: Four times a day (QID) | ORAL | Status: DC | PRN

## 2014-09-07 MED ORDER — PHENOL 1.4 % MT LIQD
1.0000 | OROMUCOSAL | Status: DC | PRN
  Filled 2014-09-07: qty 177

## 2014-09-07 MED ORDER — BISACODYL 10 MG RE SUPP: 10.0000 mg | Freq: Every day | RECTAL | Status: DC | PRN

## 2014-09-07 MED ORDER — METOCLOPRAMIDE HCL 5 MG/ML IJ SOLN: 5.0000 mg | Freq: Three times a day (TID) | INTRAMUSCULAR | Status: DC | PRN

## 2014-09-07 MED ORDER — TRANEXAMIC ACID 100 MG/ML IV SOLN
2000.0000 mg | Freq: Once | INTRAVENOUS | Status: AC
  Administered 2014-09-07: 2000 mg via TOPICAL
  Filled 2014-09-07: qty 20

## 2014-09-07 MED ORDER — LIDOCAINE HCL (CARDIAC) 20 MG/ML IV SOLN
INTRAVENOUS | Status: DC | PRN
  Administered 2014-09-07: 30 mg via INTRAVENOUS

## 2014-09-07 MED ORDER — ROSUVASTATIN CALCIUM 40 MG PO TABS
40.0000 mg | ORAL_TABLET | Freq: Every day | ORAL | Status: DC
  Administered 2014-09-07 – 2014-09-08 (×2): 40 mg via ORAL
  Filled 2014-09-07 (×2): qty 1

## 2014-09-07 MED ORDER — DEXAMETHASONE SODIUM PHOSPHATE 10 MG/ML IJ SOLN
10.0000 mg | Freq: Once | INTRAMUSCULAR | Status: DC
  Filled 2014-09-07: qty 1

## 2014-09-07 MED ORDER — DEXAMETHASONE SODIUM PHOSPHATE 10 MG/ML IJ SOLN
10.0000 mg | Freq: Once | INTRAMUSCULAR | Status: AC
Start: 2014-09-07 — End: 2014-09-07
  Administered 2014-09-07: 10 mg via INTRAVENOUS

## 2014-09-07 MED ORDER — KETOROLAC TROMETHAMINE 30 MG/ML IJ SOLN
INTRAMUSCULAR | Status: DC | PRN
  Administered 2014-09-07: 30 mg via INTRAMUSCULAR

## 2014-09-07 MED ORDER — BUPIVACAINE IN DEXTROSE 0.75-8.25 % IT SOLN
INTRATHECAL | Status: DC | PRN
  Administered 2014-09-07: 1.8 mL via INTRATHECAL

## 2014-09-07 MED ORDER — POLYETHYLENE GLYCOL 3350 17 G PO PACK
17.0000 g | PACK | Freq: Two times a day (BID) | ORAL | Status: DC
  Administered 2014-09-07 – 2014-09-08 (×2): 17 g via ORAL

## 2014-09-07 MED ORDER — PROPOFOL INFUSION 10 MG/ML OPTIME
INTRAVENOUS | Status: DC | PRN
  Administered 2014-09-07: 100 ug/kg/min via INTRAVENOUS

## 2014-09-07 MED ORDER — SODIUM CHLORIDE 0.9 % IV SOLN
INTRAVENOUS | Status: DC
  Administered 2014-09-07: 18:00:00 via INTRAVENOUS
  Filled 2014-09-07 (×2): qty 1000

## 2014-09-07 MED ORDER — FENTANYL CITRATE 0.05 MG/ML IJ SOLN
25.0000 ug | INTRAMUSCULAR | Status: DC | PRN
  Administered 2014-09-07: 25 ug via INTRAVENOUS

## 2014-09-07 MED ORDER — DEXTROSE 5 % IV SOLN
500.0000 mg | Freq: Four times a day (QID) | INTRAVENOUS | Status: DC | PRN
  Administered 2014-09-07: 500 mg via INTRAVENOUS
  Filled 2014-09-07 (×2): qty 5

## 2014-09-07 MED ORDER — NITROGLYCERIN 0.4 MG SL SUBL: 0.4000 mg | SUBLINGUAL_TABLET | SUBLINGUAL | Status: DC | PRN

## 2014-09-07 MED ORDER — PROPOFOL 10 MG/ML IV BOLUS
INTRAVENOUS | Status: DC | PRN
  Administered 2014-09-07: 20 mg via INTRAVENOUS
  Administered 2014-09-07: 10 mg via INTRAVENOUS
  Administered 2014-09-07: 20 mg via INTRAVENOUS

## 2014-09-07 MED ORDER — TRAZODONE HCL 150 MG PO TABS
150.0000 mg | ORAL_TABLET | Freq: Every day | ORAL | Status: DC
  Administered 2014-09-07: 150 mg via ORAL
  Filled 2014-09-07 (×2): qty 1

## 2014-09-07 MED ORDER — SODIUM CHLORIDE 0.9 % IJ SOLN
INTRAMUSCULAR | Status: DC | PRN
  Administered 2014-09-07: 30 mL

## 2014-09-07 MED ORDER — DIPHENHYDRAMINE HCL 25 MG PO CAPS: 25.0000 mg | ORAL_CAPSULE | Freq: Four times a day (QID) | ORAL | Status: DC | PRN

## 2014-09-07 MED ORDER — CEFAZOLIN SODIUM-DEXTROSE 2-3 GM-% IV SOLR
2.0000 g | Freq: Four times a day (QID) | INTRAVENOUS | Status: AC
  Administered 2014-09-07 – 2014-09-08 (×2): 2 g via INTRAVENOUS
  Filled 2014-09-07 (×2): qty 50

## 2014-09-07 MED ORDER — FENTANYL CITRATE 0.05 MG/ML IJ SOLN
INTRAMUSCULAR | Status: AC
  Filled 2014-09-07: qty 2

## 2014-09-07 MED ORDER — HYDROCODONE-ACETAMINOPHEN 7.5-325 MG PO TABS
1.0000 | ORAL_TABLET | ORAL | Status: DC
  Administered 2014-09-07: 1 via ORAL
  Administered 2014-09-07 – 2014-09-08 (×4): 2 via ORAL
  Filled 2014-09-07: qty 2
  Filled 2014-09-07: qty 1
  Filled 2014-09-07 (×3): qty 2

## 2014-09-07 MED ORDER — LACTATED RINGERS IV SOLN
INTRAVENOUS | Status: DC
  Administered 2014-09-07: 1000 mL via INTRAVENOUS
  Administered 2014-09-07: 16:00:00 via INTRAVENOUS

## 2014-09-07 MED ORDER — METOPROLOL SUCCINATE ER 25 MG PO TB24
25.0000 mg | ORAL_TABLET | Freq: Two times a day (BID) | ORAL | Status: DC
  Administered 2014-09-07: 25 mg via ORAL
  Filled 2014-09-07 (×3): qty 1

## 2014-09-07 MED ORDER — MAGNESIUM CITRATE PO SOLN: 1.0000 | Freq: Once | ORAL | Status: AC | PRN

## 2014-09-07 MED ORDER — CEFAZOLIN SODIUM-DEXTROSE 2-3 GM-% IV SOLR
INTRAVENOUS | Status: AC
  Filled 2014-09-07: qty 50

## 2014-09-07 MED ORDER — 0.9 % SODIUM CHLORIDE (POUR BTL) OPTIME
TOPICAL | Status: DC | PRN
  Administered 2014-09-07: 1000 mL

## 2014-09-07 MED ORDER — METOCLOPRAMIDE HCL 10 MG PO TABS: 5.0000 mg | ORAL_TABLET | Freq: Three times a day (TID) | ORAL | Status: DC | PRN

## 2014-09-07 MED ORDER — SODIUM CHLORIDE 0.9 % IR SOLN
Status: DC | PRN
  Administered 2014-09-07: 1000 mL

## 2014-09-07 MED ORDER — MIDAZOLAM HCL 2 MG/2ML IJ SOLN
INTRAMUSCULAR | Status: AC
  Filled 2014-09-07: qty 2

## 2014-09-07 MED ORDER — ONDANSETRON HCL 4 MG/2ML IJ SOLN
INTRAMUSCULAR | Status: DC | PRN
  Administered 2014-09-07: 4 mg via INTRAVENOUS

## 2014-09-07 MED ORDER — SODIUM CHLORIDE 0.9 % IJ SOLN
INTRAMUSCULAR | Status: AC
  Filled 2014-09-07: qty 50

## 2014-09-07 MED ORDER — CELECOXIB 200 MG PO CAPS
200.0000 mg | ORAL_CAPSULE | Freq: Two times a day (BID) | ORAL | Status: DC
  Administered 2014-09-07 – 2014-09-08 (×2): 200 mg via ORAL
  Filled 2014-09-07 (×3): qty 1

## 2014-09-07 MED ORDER — MEPERIDINE HCL 50 MG/ML IJ SOLN: 6.2500 mg | INTRAMUSCULAR | Status: DC | PRN

## 2014-09-07 MED ORDER — MENTHOL 3 MG MT LOZG: 1.0000 | LOZENGE | OROMUCOSAL | Status: DC | PRN

## 2014-09-07 MED ORDER — METHOCARBAMOL 500 MG PO TABS
500.0000 mg | ORAL_TABLET | Freq: Four times a day (QID) | ORAL | Status: DC | PRN
  Administered 2014-09-08: 500 mg via ORAL
  Filled 2014-09-07: qty 1

## 2014-09-07 MED ORDER — BUPIVACAINE-EPINEPHRINE (PF) 0.25% -1:200000 IJ SOLN
INTRAMUSCULAR | Status: DC | PRN
  Administered 2014-09-07: 30 mL

## 2014-09-07 MED ORDER — KETOROLAC TROMETHAMINE 30 MG/ML IJ SOLN
INTRAMUSCULAR | Status: AC
  Filled 2014-09-07: qty 1

## 2014-09-07 MED ORDER — HYDROMORPHONE HCL 1 MG/ML IJ SOLN: 0.5000 mg | INTRAMUSCULAR | Status: DC | PRN

## 2014-09-07 MED ORDER — BUPIVACAINE-EPINEPHRINE (PF) 0.25% -1:200000 IJ SOLN
INTRAMUSCULAR | Status: AC
  Filled 2014-09-07: qty 30

## 2014-09-07 SURGICAL SUPPLY — 53 items
BAG DECANTER FOR FLEXI CONT (MISCELLANEOUS) ×3 IMPLANT
BAG ZIPLOCK 12X15 (MISCELLANEOUS) IMPLANT
BANDAGE ELASTIC 6 VELCRO ST LF (GAUZE/BANDAGES/DRESSINGS) ×3 IMPLANT
BANDAGE ESMARK 6X9 LF (GAUZE/BANDAGES/DRESSINGS) ×1 IMPLANT
BLADE SAW SGTL 13.0X1.19X90.0M (BLADE) ×3 IMPLANT
BNDG ESMARK 6X9 LF (GAUZE/BANDAGES/DRESSINGS) ×3
BONE CEMENT GENTAMICIN (Cement) ×6 IMPLANT
BOWL SMART MIX CTS (DISPOSABLE) ×3 IMPLANT
CAPT KNEE TOTAL 3 ATTUNE ×3 IMPLANT
CEMENT BONE GENTAMICIN 40 (Cement) ×2 IMPLANT
CUFF TOURN SGL QUICK 34 (TOURNIQUET CUFF) ×2
CUFF TRNQT CYL 34X4X40X1 (TOURNIQUET CUFF) ×1 IMPLANT
DECANTER SPIKE VIAL GLASS SM (MISCELLANEOUS) ×3 IMPLANT
DRAPE EXTREMITY T 121X128X90 (DRAPE) ×3 IMPLANT
DRAPE POUCH INSTRU U-SHP 10X18 (DRAPES) ×3 IMPLANT
DRAPE U-SHAPE 47X51 STRL (DRAPES) ×3 IMPLANT
DRSG AQUACEL AG ADV 3.5X10 (GAUZE/BANDAGES/DRESSINGS) ×3 IMPLANT
DURAPREP 26ML APPLICATOR (WOUND CARE) ×6 IMPLANT
ELECT REM PT RETURN 9FT ADLT (ELECTROSURGICAL) ×3
ELECTRODE REM PT RTRN 9FT ADLT (ELECTROSURGICAL) ×1 IMPLANT
FACESHIELD WRAPAROUND (MASK) ×15 IMPLANT
GLOVE BIOGEL PI IND STRL 7.5 (GLOVE) ×1 IMPLANT
GLOVE BIOGEL PI IND STRL 8.5 (GLOVE) ×1 IMPLANT
GLOVE BIOGEL PI INDICATOR 7.5 (GLOVE) ×2
GLOVE BIOGEL PI INDICATOR 8.5 (GLOVE) ×2
GLOVE ECLIPSE 8.0 STRL XLNG CF (GLOVE) ×3 IMPLANT
GLOVE ORTHO TXT STRL SZ7.5 (GLOVE) ×6 IMPLANT
GOWN SPEC L3 XXLG W/TWL (GOWN DISPOSABLE) ×3 IMPLANT
GOWN STRL REUS W/TWL LRG LVL3 (GOWN DISPOSABLE) ×3 IMPLANT
HANDPIECE INTERPULSE COAX TIP (DISPOSABLE) ×2
KIT BASIN OR (CUSTOM PROCEDURE TRAY) ×3 IMPLANT
LIQUID BAND (GAUZE/BANDAGES/DRESSINGS) ×3 IMPLANT
MANIFOLD NEPTUNE II (INSTRUMENTS) ×3 IMPLANT
NDL SAFETY ECLIPSE 18X1.5 (NEEDLE) ×2 IMPLANT
NEEDLE HYPO 18GX1.5 SHARP (NEEDLE) ×4
PACK TOTAL JOINT (CUSTOM PROCEDURE TRAY) ×3 IMPLANT
PEN SKIN MARKING BROAD (MISCELLANEOUS) ×3 IMPLANT
POSITIONER SURGICAL ARM (MISCELLANEOUS) ×3 IMPLANT
SET HNDPC FAN SPRY TIP SCT (DISPOSABLE) ×1 IMPLANT
SET PAD KNEE POSITIONER (MISCELLANEOUS) ×3 IMPLANT
SUCTION FRAZIER 12FR DISP (SUCTIONS) ×3 IMPLANT
SUT MNCRL AB 4-0 PS2 18 (SUTURE) ×3 IMPLANT
SUT VIC AB 1 CT1 36 (SUTURE) ×3 IMPLANT
SUT VIC AB 2-0 CT1 27 (SUTURE) ×6
SUT VIC AB 2-0 CT1 TAPERPNT 27 (SUTURE) ×3 IMPLANT
SUT VLOC 180 0 24IN GS25 (SUTURE) ×3 IMPLANT
SYR 50ML LL SCALE MARK (SYRINGE) ×6 IMPLANT
TOWEL OR 17X26 10 PK STRL BLUE (TOWEL DISPOSABLE) ×3 IMPLANT
TOWEL OR NON WOVEN STRL DISP B (DISPOSABLE) ×3 IMPLANT
TRAY FOLEY CATH 14FRSI W/METER (CATHETERS) ×3 IMPLANT
WATER STERILE IRR 1500ML POUR (IV SOLUTION) ×3 IMPLANT
WRAP KNEE MAXI GEL POST OP (GAUZE/BANDAGES/DRESSINGS) ×3 IMPLANT
YANKAUER SUCT BULB TIP 10FT TU (MISCELLANEOUS) ×3 IMPLANT

## 2014-09-07 NOTE — Interval H&P Note (Signed)
History and Physical Interval Note:  09/07/2014 12:08 PM  [name]  has presented today for surgery, with the diagnosis of right knee osteoarthritis  The various methods of treatment have been discussed with the patient and family. After consideration of risks, benefits and other options for treatment, the patient has consented to  Procedure(s): RIGHT TOTAL KNEE ARTHROPLASTY (Right) as a surgical intervention .  The patient's history has been reviewed, patient examined, no change in status, stable for surgery.  I have reviewed the patient's chart and labs.  Questions were answered to the patient's satisfaction.     Mauri Pole

## 2014-09-07 NOTE — Anesthesia Procedure Notes (Signed)
Spinal Patient location during procedure: OR Start time: 09/07/2014 2:25 PM End time: 09/07/2014 2:30 PM Staffing Anesthesiologist: Montez Hageman Resident/CRNA: Darlys Gales R Performed by: resident/CRNA  Preanesthetic Checklist Completed: patient identified, site marked, surgical consent, pre-op evaluation, timeout performed, IV checked, risks and benefits discussed and monitors and equipment checked Spinal Block Patient position: sitting Prep: Betadine Patient monitoring: heart rate, continuous pulse ox and blood pressure Approach: midline Location: L3-4 Injection technique: single-shot Needle Needle type: Spinocan  Needle gauge: 22 G Needle length: 9 cm Needle insertion depth: 8 cm Assessment Sensory level: T6 Additional Notes Expiration date of kit checked and confirmed. Patient tolerated procedure well, without complications.

## 2014-09-07 NOTE — Anesthesia Preprocedure Evaluation (Addendum)
Anesthesia Evaluation  Patient identified by MRN, date of birth, ID band Patient awake    Reviewed: Allergy & Precautions, H&P , NPO status , Patient's Chart, lab work & pertinent test results  Airway Mallampati: II  TM Distance: >3 FB Neck ROM: Full    Dental no notable dental hx.    Pulmonary shortness of breath, pneumonia -, former smoker,  breath sounds clear to auscultation  Pulmonary exam normal       Cardiovascular Exercise Tolerance: Good hypertension, Pt. on medications and Pt. on home beta blockers + angina + CAD, + Cardiac Stents (2010) and +CHF + dysrhythmias Atrial Fibrillation Rhythm:Regular Rate:Normal  ECHO 2013 reviewed. EF normal. Grade 1 diastolic dysfunction  ECG: Normal  CXR: Chronic bronchitic changes.  Normal myoview December 2014  Denies recent chest pain. Sees Dr. Rayann Heman at least once per year.   Neuro/Psych negative neurological ROS  negative psych ROS   GI/Hepatic negative GI ROS, Neg liver ROS,   Endo/Other  neg diabetesPatient denies h/o diabetes  Renal/GU negative Renal ROS  negative genitourinary   Musculoskeletal  (+) Arthritis -,   Abdominal (+) + obese,   Peds negative pediatric ROS (+)  Hematology negative hematology ROS (+)   Anesthesia Other Findings   Reproductive/Obstetrics negative OB ROS                             Anesthesia Physical  Anesthesia Plan  ASA: III  Anesthesia Plan: Spinal   Post-op Pain Management:    Induction:   Airway Management Planned: Simple Face Mask  Additional Equipment:   Intra-op Plan:   Post-operative Plan:   Informed Consent: I have reviewed the patients History and Physical, chart, labs and discussed the procedure including the risks, benefits and alternatives for the proposed anesthesia with the patient or authorized representative who has indicated his/her understanding and acceptance.   Dental  advisory given  Plan Discussed with: CRNA  Anesthesia Plan Comments:        Anesthesia Quick Evaluation

## 2014-09-07 NOTE — Op Note (Signed)
NAME:  [name]                      MEDICAL RECORD NO.:  [id number]                             FACILITY:  [facility]      PHYSICIAN:  Pietro Cassis. Alvan Dame, M.D.  DATE OF BIRTH:  May 11, 1946      DATE OF PROCEDURE:  09/07/2014                                     OPERATIVE REPORT         PREOPERATIVE DIAGNOSIS:  Right knee osteoarthritis.      POSTOPERATIVE DIAGNOSIS:  Right knee osteoarthritis.      FINDINGS:  The patient was noted to have complete loss of cartilage and   bone-on-bone arthritis with associated osteophytes in the medial and patellofemoral compartments of   the knee with a significant synovitis and associated effusion.      PROCEDURE:  Right total knee replacement.      COMPONENTS USED:  DePuy Attune rotating platform posterior stabilized knee   system, a size 5 femur, 4 tibia, size 6 mm PS AOX insert, and 35 anatomic patellar   button.      SURGEON:  Pietro Cassis. Alvan Dame, M.D.      ASSISTANT:  Danae Orleans, PA-C.      ANESTHESIA:  Spinal.      SPECIMENS:  None.      COMPLICATION:  None.      DRAINS:   None.  EBL: <50cc      TOURNIQUET TIME:   Total Tourniquet Time Documented: Thigh (Right) - 29 minutes Total: Thigh (Right) - 29 minutes     The patient was stable to the recovery room.      INDICATION FOR PROCEDURE:  KEILEIGH VAHEY is a 69 y.o. female patient of   mine.  The patient had been seen, evaluated, and treated conservatively in the   office with medication, activity modification, and injections.  The patient had   radiographic changes of bone-on-bone arthritis with endplate sclerosis and osteophytes noted.      The patient failed conservative measures including medication, injections, and activity modification, and at this point was ready for more definitive measures.   Based on the radiographic changes and failed conservative measures, the patient   decided to proceed with total knee replacement.  Risks of infection,   DVT, component failure,  need for revision surgery, postop course, and   expectations were all   discussed and reviewed.  Consent was obtained for benefit of pain   relief.      PROCEDURE IN DETAIL:  The patient was brought to the operative theater.   Once adequate anesthesia, preoperative antibiotics, 2 gm of Ancef, 1gm of Tranexamic Acid, and 10mg  of Decadron administered, the patient was positioned supine with the right thigh tourniquet placed.  The  right lower extremity was prepped and draped in sterile fashion.  A time-   out was performed identifying the patient, planned procedure, and   extremity.      The right lower extremity was placed in the [facility] leg holder.  The leg was   exsanguinated, tourniquet elevated to 250 mmHg.  A midline incision was   made followed by median parapatellar  arthrotomy.  Following initial   exposure, attention was first directed to the patella.  Precut   measurement was noted to be 22 mm.  I resected down to 14 mm and used a   35 patellar button to restore patellar height as well as cover the cut   surface.      The lug holes were drilled and a metal shim was placed to protect the   patella from retractors and saw blades.      At this point, attention was now directed to the femur.  The femoral   canal was opened with a drill, irrigated to try to prevent fat emboli.  An   intramedullary rod was passed at 3 degrees valgus, 9 mm of bone was   resected off the distal femur.  Following this resection, the tibia was   subluxated anteriorly.  Using the extramedullary guide, 2 mm of bone was resected off   the proximal medial tibia.  We confirmed the gap would be   stable medially and laterally with a size 5 mm insert as well as confirmed   the cut was perpendicular in the coronal plane, checking with an alignment rod.      Once this was done, I sized the femur to be a size 5 in the anterior-   posterior dimension, chose a standard component based on medial and   lateral  dimension.  The size 5 rotation block was then pinned in   position anterior referenced using the C-clamp to set rotation.  The   anterior, posterior, and  chamfer cuts were made without difficulty nor   notching making certain that I was along the anterior cortex to help   with flexion gap stability.      The final box cut was made off the lateral aspect of distal femur.      At this point, the tibia was sized to be a size 4, the size 4 tray was   then pinned in position through the medial third of the tubercle,   drilled, and keel punched.  Trial reduction was now carried with a 5 femur,  4 tibia, a size 6 mm insert, and the 35 patella botton.  The knee was brought to   extension, full extension with good flexion stability with the patella   tracking through the trochlea without application of pressure.  Given   all these findings, the trial components removed.  Final components were   opened and cement was mixed.  The knee was irrigated with normal saline   solution and pulse lavage.  The synovial lining was   then injected with 30cc of 0.25% Marcaine with epinephrine, 30cc of NS and 1 cc of Toradol,   total of 61 cc.      The knee was irrigated.  Final implants were then cemented onto clean and   dried cut surfaces of bone with the knee brought to extension with a 6 mm trial insert.      Once the cement had fully cured, the excess cement was removed   throughout the knee.  I confirmed I was satisfied with the range of   motion and stability, and the final size 6 mm PS AOX insert was chosen.  It was   placed into the knee.      The tourniquet had been let down at 29 minutes.  No significant   hemostasis required.  The   extensor mechanism was then reapproximated using #1 Vicryl  an #0 V-lock sutures with the knee   in flexion.  The   remaining wound was closed with 2-0 Vicryl and running 4-0 Monocryl.   The knee was cleaned, dried, dressed sterilely using Dermabond and   Aquacel  dressing.  The patient was then   brought to recovery room in stable condition, tolerating the procedure   well.   Please note that Physician Assistant, Danae Orleans, was present for the entirety of the case, and was utilized for pre-operative positioning, peri-operative retractor management, general facilitation of the procedure.  He was also utilized for primary wound closure at the end of the case.              Pietro Cassis Alvan Dame, M.D.    09/07/2014 3:45 PM

## 2014-09-07 NOTE — Transfer of Care (Signed)
Immediate Anesthesia Transfer of Care Note  Patient: [name]  Procedure(s) Performed: Procedure(s) (LRB): RIGHT TOTAL KNEE ARTHROPLASTY (Right)  Patient Location: PACU  Anesthesia Type: Spinal  Level of Consciousness: sedated, patient cooperative and responds to stimulation  Airway & Oxygen Therapy: Patient Spontanous Breathing and Patient connected to face mask oxgen  Post-op Assessment: Report given to PACU RN and Post -op Vital signs reviewed and stable  Post vital signs: Reviewed and stable  Complications: No apparent anesthesia complications

## 2014-09-07 NOTE — Progress Notes (Signed)
Utilization review completed.  

## 2014-09-07 NOTE — Anesthesia Postprocedure Evaluation (Signed)
  Anesthesia Post-op Note  Patient: [name]  Procedure(s) Performed: Procedure(s): RIGHT TOTAL KNEE ARTHROPLASTY (Right)  Patient Location: PACU  Anesthesia Type:Spinal  Level of Consciousness: awake  Airway and Oxygen Therapy: Patient Spontanous Breathing  Post-op Pain: mild  Post-op Assessment: Post-op Vital signs reviewed, Patient's Cardiovascular Status Stable, Respiratory Function Stable, Patent Airway, No signs of Nausea or vomiting and Pain level controlled  Post-op Vital Signs: Reviewed and stable  Last Vitals:  Filed Vitals:   09/07/14 1728  BP: 155/67  Pulse: 56  Temp: 36.6 C  Resp: 15    Complications: No apparent anesthesia complications

## 2014-09-08 ENCOUNTER — Encounter (HOSPITAL_COMMUNITY): Payer: Self-pay | Admitting: Orthopedic Surgery

## 2014-09-08 DIAGNOSIS — E669 Obesity, unspecified: Secondary | ICD-10-CM | POA: Diagnosis present

## 2014-09-08 LAB — BASIC METABOLIC PANEL
ANION GAP: 6 (ref 5–15)
BUN: 16 mg/dL (ref 6–23)
CO2: 24 mmol/L (ref 19–32)
Calcium: 8.3 mg/dL — ABNORMAL LOW (ref 8.4–10.5)
Chloride: 105 mmol/L (ref 96–112)
Creatinine, Ser: 1.11 mg/dL — ABNORMAL HIGH (ref 0.50–1.10)
GFR calc Af Amer: 57 mL/min — ABNORMAL LOW (ref >90)
GFR calc non Af Amer: 49 mL/min — ABNORMAL LOW (ref >90)
GLUCOSE: 148 mg/dL — AB (ref 70–99)
Potassium: 4.5 mmol/L (ref 3.5–5.1)
SODIUM: 135 mmol/L (ref 135–145)

## 2014-09-08 LAB — CBC
HCT: 32.3 % — ABNORMAL LOW (ref 36.0–46.0)
Hemoglobin: 11 g/dL — ABNORMAL LOW (ref 12.0–15.0)
MCH: 32.6 pg (ref 26.0–34.0)
MCHC: 34.1 g/dL (ref 30.0–36.0)
MCV: 95.8 fL (ref 78.0–100.0)
PLATELETS: 122 10*3/uL — AB (ref 150–400)
RBC: 3.37 MIL/uL — ABNORMAL LOW (ref 3.87–5.11)
RDW: 12.2 % (ref 11.5–15.5)
WBC: 9.5 10*3/uL (ref 4.0–10.5)

## 2014-09-08 MED ORDER — TIZANIDINE HCL 4 MG PO TABS: 4.0000 mg | ORAL_TABLET | Freq: Four times a day (QID) | ORAL | Status: DC | PRN

## 2014-09-08 MED ORDER — HYDROCODONE-ACETAMINOPHEN 7.5-325 MG PO TABS: 1.0000 | ORAL_TABLET | ORAL | Status: DC | PRN

## 2014-09-08 MED ORDER — POLYETHYLENE GLYCOL 3350 17 G PO PACK: 17.0000 g | PACK | Freq: Two times a day (BID) | ORAL | Status: DC

## 2014-09-08 MED ORDER — DOCUSATE SODIUM 100 MG PO CAPS
100.0000 mg | ORAL_CAPSULE | Freq: Two times a day (BID) | ORAL | Status: DC
Start: 2014-09-08 — End: 2015-02-14

## 2014-09-08 MED ORDER — FERROUS SULFATE 325 (65 FE) MG PO TABS: 325.0000 mg | ORAL_TABLET | Freq: Three times a day (TID) | ORAL | Status: DC

## 2014-09-08 NOTE — Evaluation (Signed)
Occupational Therapy Evaluation Patient Details Name: [name] MRN: [id number] DOB: 09/06/1945 Today's Date: 09/08/2014    History of Present Illness s/p R TKA   Clinical Impression   This 69 year old female was admitted for the above surgery. All education was completed.  No further OT is needed at this time.      Follow Up Recommendations  No OT follow up    Equipment Recommendations  None recommended by OT    Recommendations for Other Services       Precautions / Restrictions Precautions Precautions: Fall;Knee Restrictions Weight Bearing Restrictions: No      Mobility Bed Mobility Overal bed mobility: Needs Assistance Bed Mobility: Supine to Sit;Sit to Supine       Sit to supine: Min assist   General bed mobility comments: assist for RLE  Transfers Overall transfer level: Needs assistance Equipment used: Rolling walker (2 wheeled) Transfers: Sit to/from Stand Sit to Stand: Supervision         General transfer comment: pt cued herself for UE/LE placement. Reviewed that in chair, she can push up with bil UEs    Balance                                            ADL Overall ADL's : Needs assistance/impaired     Grooming: Supervision/safety;Standing                   Toilet Transfer: Min guard;Ambulation;Comfort height toilet   Toileting- Clothing Manipulation and Hygiene: Supervision/safety;Sit to/from stand         General ADL Comments: educated on tub readiness:  pt's seat fits entirely within tub.  Husband had both knees replaced last year and will assist her:  she is not interested in AE.  Pt self cued for sit to stand. Assisted with RLE for bed mobility and reviewed ways she can manage herself and also precaution with pillow     Vision     Perception     Praxis      Pertinent Vitals/Pain Pain Assessment: 0-10 Pain Score: 2  Pain Location: R knee Pain Descriptors / Indicators: Sore Pain  Intervention(s): Limited activity within patient's tolerance;Monitored during session;Premedicated before session;Repositioned;Ice applied     Hand Dominance     Extremity/Trunk Assessment Upper Extremity Assessment Upper Extremity Assessment: Overall WFL for tasks assessed           Communication Communication Communication: No difficulties   Cognition Arousal/Alertness: Awake/alert Behavior During Therapy: WFL for tasks assessed/performed Overall Cognitive Status: Within Functional Limits for tasks assessed                     General Comments       Exercises       Shoulder Instructions      Home Living Family/patient expects to be discharged to:: Private residence Living Arrangements: Spouse/significant other                 Bathroom Shower/Tub: Tub/shower unit Shower/tub characteristics: Architectural technologist: Standard     Home Equipment: Shower seat;Bedside commode   Additional Comments: shower seat fits completely inside of tub      Prior Functioning/Environment Level of Independence: Independent             OT Diagnosis: Generalized weakness   OT Problem List:  OT Treatment/Interventions:      OT Goals(Current goals can be found in the care plan section) Acute Rehab OT Goals Patient Stated Goal: regain independence  OT Frequency:     Barriers to D/C:            Co-evaluation              End of Session    Activity Tolerance: Patient tolerated treatment well Patient left: in bed;with call bell/phone within reach   Time: 0926-0942 OT Time Calculation (min): 16 min Charges:  OT General Charges $OT Visit: 1 Procedure OT Evaluation $Initial OT Evaluation Tier I: 1 Procedure G-Codes:    Robyn Nohr 23-Sep-2014, 9:49 AM  Lesle Chris, OTR/L [phone] 09/23/14

## 2014-09-08 NOTE — Progress Notes (Signed)
     Subjective: 1 Day Post-Op Procedure(s) (LRB): RIGHT TOTAL KNEE ARTHROPLASTY (Right)   Patient reports pain as mild, pain controlled. No events throughout the night. Ready to be discharged home if she does well with PT.  Objective:   VITALS:   Filed Vitals:   09/08/14  BP: 142/68  Pulse: 53  Temp: 98.5 F (36.9 C)   Resp: 16    Dorsiflexion/Plantar flexion intact Incision: dressing C/D/I No cellulitis present Compartment soft  LABS  Recent Labs  09/08/14 0440  HGB 11.0*  HCT 32.3*  WBC 9.5  PLT 122*     Recent Labs  09/08/14 0440  NA 135  K 4.5  BUN 16  CREATININE 1.11*  GLUCOSE 148*     Assessment/Plan: 1 Day Post-Op Procedure(s) (LRB): RIGHT TOTAL KNEE ARTHROPLASTY (Right) Foley cath d/c'ed Advance diet Up with therapy D/C IV fluids Discharge home with home health  Follow up in 2 weeks at [facility]. Follow up with [name],Kasiya Burck D in 2 weeks.  Contact information:  [facility] [address] [place] [phone]    Obese (BMI 30-39.9) Estimated body mass index is 33.31 kg/(m^2) as calculated from the following:   Height as of this encounter: 5\' 3"  (1.6 m).   Weight as of this encounter: 85.276 kg (188 lb). Patient also counseled that weight may inhibit the healing process Patient counseled that losing weight will help with future health issues        West Pugh. Rayelle Armor   PAC  09/08/2014, 9:07 AM

## 2014-09-08 NOTE — Progress Notes (Signed)
Physical Therapy Treatment Patient Details Name: [name] MRN: [id number] DOB: [birth year] Today's Date: 10/02/14    History of Present Illness s/p R TKA    PT Comments    Pt progressing well and eager for dc  Follow Up Recommendations  Home health PT     Equipment Recommendations  None recommended by PT    Recommendations for Other Services OT consult     Precautions / Restrictions Precautions Precautions: Fall;Knee Restrictions Weight Bearing Restrictions: No Other Position/Activity Restrictions: WBAT    Mobility  Bed Mobility Overal bed mobility: Modified Independent Bed Mobility: Supine to Sit     Supine to sit: Modified independent (Device/Increase time) Sit to supine: Min assist   General bed mobility comments: assist for RLE  Transfers Overall transfer level: Needs assistance Equipment used: Rolling walker (2 wheeled) Transfers: Sit to/from Stand Sit to Stand: Supervision         General transfer comment: cues for LE management and use of UEs for self assist  Ambulation/Gait Ambulation/Gait assistance: Min guard Ambulation Distance (Feet): 65 Feet Assistive device: Rolling walker (2 wheeled) Gait Pattern/deviations: Step-to pattern;Decreased step length - right;Decreased step length - left;Shuffle;Trunk flexed Gait velocity: decr   General Gait Details: cues for sequence, posture and position from RW   Stairs Stairs: Yes Stairs assistance: Min assist Stair Management: Two rails;Step to pattern;Forwards Number of Stairs: 2 General stair comments: cues for sequence and foot placement  Wheelchair Mobility    Modified Rankin (Stroke Patients Only)       Balance                                    Cognition Arousal/Alertness: Awake/alert Behavior During Therapy: WFL for tasks assessed/performed Overall Cognitive Status: Within Functional Limits for tasks assessed                      Exercises       General Comments        Pertinent Vitals/Pain Pain Assessment: 0-10 Pain Score: 3  Pain Location: R knee Pain Descriptors / Indicators: Aching;Sore Pain Intervention(s): Limited activity within patient's tolerance;Monitored during session;Premedicated before session;Ice applied    Home Living Family/patient expects to be discharged to:: Private residence Living Arrangements: Spouse/significant other           Home Equipment: Shower seat;Bedside commode Additional Comments: shower seat fits completely inside of tub    Prior Function Level of Independence: Independent          PT Goals (current goals can now be found in the care plan section) Acute Rehab PT Goals Patient Stated Goal: regain independence PT Goal Formulation: With patient Time For Goal Achievement: 09/15/14 Potential to Achieve Goals: Good Progress towards PT goals: Progressing toward goals    Frequency  7X/week    PT Plan Current plan remains appropriate    Co-evaluation             End of Session Equipment Utilized During Treatment: Gait belt Activity Tolerance: Patient tolerated treatment well Patient left: Other (comment);with family/visitor present;with call bell/phone within reach (sitting EOB for lunch)     Time: 9629-5284 PT Time Calculation (min) (ACUTE ONLY): 18 min  Charges:  $Gait Training: 8-22 mins                    G Codes:      Precilla Purnell 10-02-14, 12:20 PM

## 2014-09-08 NOTE — Evaluation (Signed)
Physical Therapy Evaluation Patient Details Name: [name] MRN: [id number] DOB: 01/29/1946 Today's Date: 09/08/2014   History of Present Illness  s/p R TKA  Clinical Impression  Pt s/p R TKR presents with decreased R LE strength/ROM and post op pain limiting functional mobility.  Pt should progress well to dc home with family assist and HHPT follow up.    Follow Up Recommendations Home health PT    Equipment Recommendations  None recommended by PT    Recommendations for Other Services OT consult     Precautions / Restrictions Precautions Precautions: Fall;Knee Restrictions Weight Bearing Restrictions: No Other Position/Activity Restrictions: WBAT      Mobility  Bed Mobility Overal bed mobility: Needs Assistance Bed Mobility: Supine to Sit;Sit to Supine     Supine to sit: Supervision Sit to supine: Min assist   General bed mobility comments: assist for RLE  Transfers Overall transfer level: Needs assistance Equipment used: Rolling walker (2 wheeled) Transfers: Sit to/from Stand Sit to Stand: Supervision         General transfer comment: pt cued herself for UE/LE placement. Reviewed that in chair, she can push up with bil UEs  Ambulation/Gait Ambulation/Gait assistance: Min assist;Min guard Ambulation Distance (Feet): 38 Feet Assistive device: Rolling walker (2 wheeled) Gait Pattern/deviations: Step-to pattern;Decreased step length - right;Decreased step length - left;Shuffle;Trunk flexed Gait velocity: decr   General Gait Details: cues for sequence, posture and position from RW - distance ltd by increasing pain  Stairs            Wheelchair Mobility    Modified Rankin (Stroke Patients Only)       Balance                                             Pertinent Vitals/Pain Pain Assessment: 0-10 Pain Score: 2  Pain Location: R knee Pain Descriptors / Indicators: Sore Pain Intervention(s): Limited activity within  patient's tolerance;Monitored during session;Premedicated before session;Repositioned;Ice applied    Home Living Family/patient expects to be discharged to:: Private residence Living Arrangements: Spouse/significant other Available Help at Discharge: Family Type of Home: House Home Access: Stairs to enter Entrance Stairs-Rails: Right;Left;Can reach both Technical brewer of Steps: 4 Home Layout: One level Home Equipment: Shower seat;Bedside commode Additional Comments: shower seat fits completely inside of tub    Prior Function Level of Independence: Independent               Hand Dominance        Extremity/Trunk Assessment   Upper Extremity Assessment: Overall WFL for tasks assessed           Lower Extremity Assessment: RLE deficits/detail RLE Deficits / Details: 3/5 quads with AAROM at knee -10 - 80    Cervical / Trunk Assessment: Normal  Communication   Communication: No difficulties  Cognition Arousal/Alertness: Awake/alert Behavior During Therapy: WFL for tasks assessed/performed Overall Cognitive Status: Within Functional Limits for tasks assessed                      General Comments      Exercises Total Joint Exercises Ankle Circles/Pumps: AROM;Both;15 reps;Supine Quad Sets: AROM;Both;10 reps;Supine Heel Slides: AAROM;Right;15 reps;Supine Straight Leg Raises: AAROM;AROM;Right;15 reps;Supine      Assessment/Plan    PT Assessment Patient needs continued PT services  PT Diagnosis Difficulty walking   PT Problem List  Decreased strength;Decreased range of motion;Decreased activity tolerance;Decreased mobility;Decreased knowledge of use of DME;Pain  PT Treatment Interventions DME instruction;Gait training;Stair training;Functional mobility training;Therapeutic activities;Therapeutic exercise;Patient/family education   PT Goals (Current goals can be found in the Care Plan section) Acute Rehab PT Goals Patient Stated Goal: regain  independence PT Goal Formulation: With patient Time For Goal Achievement: 09/15/14 Potential to Achieve Goals: Good    Frequency 7X/week   Barriers to discharge        Co-evaluation               End of Session Equipment Utilized During Treatment: Gait belt Activity Tolerance: Patient tolerated treatment well;Patient limited by pain Patient left: in chair;with call bell/phone within reach Nurse Communication: Mobility status         Time: 5208-0223 PT Time Calculation (min) (ACUTE ONLY): 41 min   Charges:   PT Evaluation $Initial PT Evaluation Tier I: 1 Procedure PT Treatments $Gait Training: 8-22 mins $Therapeutic Exercise: 8-22 mins   PT G Codes:        Horatio Bertz 10-04-14, 9:56 AM

## 2014-09-08 NOTE — Care Management Note (Signed)
    Page 1 of 1   09/08/2014     12:52:46 PM CARE MANAGEMENT NOTE 09/08/2014  Patient:  [name], [name]   Account Number:  [id number]  Date Initiated:  09/08/2014  Documentation initiated by:  [facility]  Subjective/Objective Assessment:   adm: RIGHT TOTAL KNEE ARTHROPLASTY (Right)     Action/Plan:   discharge planning   Anticipated DC Date:  09/08/2014   Anticipated DC Plan:  [place]  CM consult      [facility] Choice  HOME HEALTH   Choice offered to / List presented to:  C-1 Patient        [place] arranged  HH-2 PT      [place]   Status of service:  Completed, signed off Medicare Important Message given?   (If response is "NO", the following Medicare IM given date fields will be blank) Date Medicare IM given:   Medicare IM given by:   Date Additional Medicare IM given:   Additional Medicare IM given by:    Discharge Disposition:  [place]  Per UR Regulation:    If discussed at Long Length of Stay Meetings, dates discussed:    Comments:  09/08/14 09:30 CM met with pt to offer choice of home health agency. Pt chooses [facility] to render HHPT.  Address and contact information verified by pt.  Referral emailed to [employer], Tim.  No other CM needs were ocmmunicated. Mariane Masters, BSN, CM [phone].

## 2014-09-08 NOTE — Plan of Care (Signed)
Problem: Consults Goal: Diagnosis- Total Joint Replacement Outcome: Completed/Met Date Met:  09/08/14 Primary Total Knee RIGHT

## 2014-09-13 NOTE — Discharge Summary (Signed)
Physician Discharge Summary  Patient ID: [name] MRN: [id number] DOB/AGE: Oct 14, 1945 69 y.o.  Admit date: 09/07/2014 Discharge date: 09/08/2014   Procedures:  Procedure(s) (LRB): RIGHT TOTAL KNEE ARTHROPLASTY (Right)  Attending Physician:  Dr. Paralee Cancel   Admission Diagnoses:   Right knee primary OA / pain  Discharge Diagnoses:  Principal Problem:   S/P right TKA Active Problems:   S/P knee replacement   Obese  Past Medical History  Diagnosis Date  . Coronary artery disease     a.  s/p Xience DES to RCA 04/2009;   b. TEE 2/12: EF 40%, Large PFO;  c.  Lexiscan Myoview 05/2012: EF 69%, no ischemia. LHC (05/2012):  Ostial diagonal 30-40%, proximal mid ramus intermedius 40-50%, RCA stent patent with 40-50% after stent, then 40%, distal RCA 40-50%, EF 55-65%. Medical therapy continued.;  d.  Carlton Adam Myoview (06/2013):  No ischemia, EF 83%, normal study  . History of recurrent UTIs   . S/P drug eluting coronary stent placement   . History of basal cell carcinoma excision     scalp  . PAF (paroxysmal atrial fibrillation)   . S/P ablation of atrial fibrillation   . Chronic diarrhea   . Fecal incontinence   . Borderline diabetes   . Hypertension   . History of kidney stones 51 years ago    x1  . GERD (gastroesophageal reflux disease)     occasional  . Arthritis     osteoarthritis    HPI:    [name], 69 y.o. female, has a history of pain and functional disability in the right knee due to arthritis and has failed non-surgical conservative treatments for greater than 12 weeks to include NSAID's and/or analgesics, corticosteriod injections, viscosupplementation injections and activity modification. Onset of symptoms was gradual, starting 15+ years ago with gradually worsening course since that time. The patient noted prior procedures on the knee to include arthroscopy on the right knee(s). Patient currently rates pain in the right knee(s) at 9 out of 10 with  activity. Patient has worsening of pain with activity and weight bearing, pain that interferes with activities of daily living, pain with passive range of motion, crepitus and joint swelling. Patient has evidence of periarticular osteophytes and joint space narrowing by imaging studies. There is no active infection. Risks, benefits and expectations were discussed with the patient. Risks including but not limited to the risk of anesthesia, blood clots, nerve damage, blood vessel damage, failure of the prosthesis, infection and up to and including death. Patient understand the risks, benefits and expectations and wishes to proceed with surgery.  PCP: [name], MD   Discharged Condition: good  Hospital Course:  Patient underwent the above stated procedure on 09/07/2014. Patient tolerated the procedure well and brought to the recovery room in good condition and subsequently to the floor.  POD #1 BP: 142/68 ; Pulse: 53 ; Temp: 98.5 F (36.9 C) ; Resp: 16 Patient reports pain as mild, pain controlled. No events throughout the night. Ready to be discharged home. Dorsiflexion/plantar flexion intact, incision: dressing C/D/I, no cellulitis present and compartment soft.   LABS  Basename    HGB  11.0  HCT  32.3    Discharge Exam: General appearance: alert, cooperative and no distress Extremities: Homans sign is negative, no sign of DVT, no edema, redness or tenderness in the calves or thighs and no ulcers, gangrene or trophic changes  Disposition: Home with follow up in 2 weeks   Follow-up Information  Follow up with Mauri Pole, MD. Schedule an appointment as soon as possible for a visit in 2 weeks.   Specialty:  Orthopedic Surgery   Contact information:   [address] [place] [address] [phone]       Follow up with [facility].   Why:  home health physical therapy   Contact information:   [place]  [address] [phone]       Discharge Instructions    Call MD / Call 911    Complete by:  As directed   If you experience chest pain or shortness of breath, CALL 911 and be transported to the hospital emergency room.  If you develope a fever above 101 F, pus (white drainage) or increased drainage or redness at the wound, or calf pain, call your surgeon's office.     Change dressing    Complete by:  As directed   Maintain surgical dressing until follow up in the clinic. If the edges start to pull up, may reinforce with tape. If the dressing is no longer working, may remove and cover with gauze and tape, but must keep the area dry and clean.  Call with any questions or concerns.     Constipation Prevention    Complete by:  As directed   Drink plenty of fluids.  Prune juice may be helpful.  You may use a stool softener, such as Colace (over the counter) 100 mg twice a day.  Use MiraLax (over the counter) for constipation as needed.     Diet - low sodium heart healthy    Complete by:  As directed      Discharge instructions    Complete by:  As directed   Maintain surgical dressing until follow up in the clinic. If the edges start to pull up, may reinforce with tape. If the dressing is no longer working, may remove and cover with gauze and tape, but must keep the area dry and clean.  Follow up in 2 weeks at [facility]. Call with any questions or concerns.     Driving restrictions    Complete by:  As directed   No driving for 4 weeks     Increase activity slowly as tolerated    Complete by:  As directed      TED hose    Complete by:  As directed   Use stockings (TED hose) for 2 weeks on both leg(s).  You may remove them at night for sleeping.     Weight bearing as tolerated    Complete by:  As directed   Laterality:  right  Extremity:  Lower             Medication List    STOP taking these medications        traMADol 50 MG tablet  Commonly known as:  ULTRAM      TAKE  these medications        dabigatran 150 MG Caps capsule  Commonly known as:  PRADAXA  Take 1 capsule (150 mg total) by mouth every 12 (twelve) hours.     diphenhydrAMINE 25 mg capsule  Commonly known as:  BENADRYL  Take 25 mg by mouth at bedtime as needed for sleep.     docusate sodium 100 MG capsule  Commonly known as:  COLACE  Take 1 capsule (100 mg total) by mouth 2 (two) times daily.     ferrous sulfate 325 (65  FE) MG tablet  Take 1 tablet (325 mg total) by mouth 3 (three) times daily after meals.     HYDROcodone-acetaminophen 7.5-325 MG per tablet  Commonly known as:  NORCO  Take 1-2 tablets by mouth every 4 (four) hours as needed for moderate pain.     lisinopril 10 MG tablet  Commonly known as:  PRINIVIL,ZESTRIL  TAKE 1 TABLET DAILY     metoprolol succinate 25 MG 24 hr tablet  Commonly known as:  TOPROL-XL  Take 25 mg by mouth 2 (two) times daily.     nitroGLYCERIN 0.4 MG SL tablet  Commonly known as:  NITROSTAT  Place 0.4 mg under the tongue every 5 (five) minutes as needed for chest pain.     polyethylene glycol packet  Commonly known as:  MIRALAX / GLYCOLAX  Take 17 g by mouth 2 (two) times daily.     rosuvastatin 40 MG tablet  Commonly known as:  CRESTOR  Take 40 mg by mouth daily.     tiZANidine 4 MG tablet  Commonly known as:  ZANAFLEX  Take 1 tablet (4 mg total) by mouth every 6 (six) hours as needed for muscle spasms.     traZODone 50 MG tablet  Commonly known as:  DESYREL  Take 150 mg by mouth at bedtime.         Signed: [place] Pugh. Nathanael Krist   PA-C  09/13/2014, 3:06 PM

## 2014-10-10 ENCOUNTER — Other Ambulatory Visit: Payer: Self-pay | Admitting: Internal Medicine

## 2015-01-07 ENCOUNTER — Other Ambulatory Visit: Payer: Self-pay | Admitting: Internal Medicine

## 2015-02-14 ENCOUNTER — Ambulatory Visit (INDEPENDENT_AMBULATORY_CARE_PROVIDER_SITE_OTHER): Payer: Medicare Other | Admitting: Physician Assistant

## 2015-02-14 ENCOUNTER — Encounter: Payer: Self-pay | Admitting: Physician Assistant

## 2015-02-14 VITALS — BP 120/74 | HR 65 | Ht 63.0 in | Wt 176.4 lb

## 2015-02-14 DIAGNOSIS — I480 Paroxysmal atrial fibrillation: Secondary | ICD-10-CM

## 2015-02-14 MED ORDER — LISINOPRIL 10 MG PO TABS: 10.0000 mg | ORAL_TABLET | Freq: Every day | ORAL | Status: DC

## 2015-02-14 MED ORDER — ROSUVASTATIN CALCIUM 40 MG PO TABS: 40.0000 mg | ORAL_TABLET | Freq: Every day | ORAL | Status: DC

## 2015-02-14 MED ORDER — DABIGATRAN ETEXILATE MESYLATE 150 MG PO CAPS: 150.0000 mg | ORAL_CAPSULE | Freq: Two times a day (BID) | ORAL | Status: DC

## 2015-02-14 MED ORDER — METOPROLOL SUCCINATE ER 25 MG PO TB24: 25.0000 mg | ORAL_TABLET | Freq: Two times a day (BID) | ORAL | Status: DC

## 2015-02-14 NOTE — Assessment & Plan Note (Signed)
Patient had lipid panel checked by primary in May. We'll try to get these results. Continue Crestor.

## 2015-02-14 NOTE — Progress Notes (Signed)
Cardiology Office Note   Date:  02/14/2015   ID:  [name], DOB 1946-03-20, MRN [id number]  PCP:  [name], MD  Cardiologist: Dr. Rayann Heman  Chief Complaint:yearly follow-up    History of Present Illness: [name] is a 69 y.o. female who presents for six-month follow-up. She has a history of CAD status post DES to the RCA in 2010, PAF, status post PV ablation in 2012, type 2 diabetes mellitus, hypertension and hyperlipidemia.last heart cath 2013 ostial diagonal. A 40%, proximal mid RI 40-50%, RCA stent patent with 40-50% after stent then 40%, distal RCA 40-50% EF 55-65% medical management.She was seen back in February for presurgical clearance. She underwent right total knee replacement without difficulty.  Patient comes in today doing quite well. She is questioning whether or not she she needs to take Crestor. She she is doing rehabilitation since her knee replacement and able to get around much better. She denies any chest pain, palpitations, dyspnea, dyspnea on exertion, dizziness, or presyncope. She was a little confused about needing to follow-up with [place] regularly.    Past Medical History  Diagnosis Date  . Coronary artery disease     a.  s/p Xience DES to RCA 04/2009;   b. TEE 2/12: EF 40%, Large PFO;  c.  Lexiscan Myoview 05/2012: EF 69%, no ischemia. LHC (05/2012):  Ostial diagonal 30-40%, proximal mid ramus intermedius 40-50%, RCA stent patent with 40-50% after stent, then 40%, distal RCA 40-50%, EF 55-65%. Medical therapy continued.;  d.  Carlton Adam Myoview (06/2013):  No ischemia, EF 83%, normal study  . History of recurrent UTIs   . S/P drug eluting coronary stent placement   . History of basal cell carcinoma excision     scalp  . PAF (paroxysmal atrial fibrillation)   . S/P ablation of atrial fibrillation   . Chronic diarrhea   . Fecal incontinence   . Borderline diabetes   . Hypertension   . History of kidney stones 51 years ago    x1  . GERD  (gastroesophageal reflux disease)     occasional  . Arthritis     osteoarthritis    Past Surgical History  Procedure Laterality Date  . Carpal tunnel release Right 25 years ago  . Flexible sigmoidoscopy N/A 07/07/2013    Procedure: FLEXIBLE SIGMOIDOSCOPY;  Surgeon: Winfield Cunas., MD;  Location: [facility];  Service: Endoscopy;  Laterality: N/A;  unprepped  . Rectal ultrasound N/A 01/11/2014    Procedure: RECTAL ULTRASOUND;  Surgeon: Leighton Ruff, MD;  Location: [facility];  Service: Endoscopy;  Laterality: N/A;  . Anal rectal manometry N/A 01/11/2014    Procedure: ANAL RECTAL MANOMETRY;  Surgeon: Leighton Ruff, MD;  Location: [facility];  Service: Endoscopy;  Laterality: N/A;  . Bunionectomy Bilateral 2013  . Coronary angioplasty with stent placement  04-04-2009  dr Darnell Level brodie    PCI and DES x1 to  mRCA/  mLAD 40%, pCX 30%, pRCA 30%/  normal LVF  . Cardiac catheterization  10-24-2010   dr allred    singl-vessel CAD with patent stent mRCA/  moderate disease mRCA beyond stent segment/  normal lvsf  . Transthoracic echocardiogram  09-19-2011    grade I diastolic dysfunction/  ef 55-60%/  mild TR  . Cardiovascular stress test  06-15-2013  dr allred    normal perfusion study/  no ischemia/  ef 83%  . Cardiac electrophysiology mapping and ablation  08-15-2010  dr allred  . Vaginal hysterectomy  1975  W/ BILATERAL SALPINOOPHOORECTOMY  . Cholecystectomy open  [place]  . Pilonidal cyst excision  2009;   2000;   1999  . Vagus nerve stimulator insertion N/A 03/10/2014    Procedure: IMPLANTATION OF SACRAL  NERVE STIMULATOR ;  Surgeon: Leighton Ruff, MD;  Location: [place];  Service: General;  Laterality: N/A;  Sacral. [employer]  . Left heart catheterization with coronary angiogram N/A 05/09/2012    Procedure: LEFT HEART CATHETERIZATION WITH CORONARY ANGIOGRAM;  Surgeon: Hillary Bow, MD;  [facility] C[facility] LAB;  Service: Cardiovascular;   Laterality: N/A;  . Mohs surgery  ~6 years ago    scalp  . Appendectomy  1973  . Refractive surgery Bilateral 16 years ago  . Total knee arthroplasty Right 09/07/2014    Procedure: RIGHT TOTAL KNEE ARTHROPLASTY;  Surgeon: Paralee Cancel, MD;  Location: [facility];  Service: Orthopedics;  Laterality: Right;     Current Outpatient Prescriptions  Medication Sig Dispense Refill  . dabigatran (PRADAXA) 150 MG CAPS capsule Take 1 capsule (150 mg total) by mouth every 12 (twelve) hours. (Patient taking differently: Take 150 mg by mouth 2 (two) times daily. ) 180 capsule 3  . diphenhydrAMINE (BENADRYL) 25 mg capsule Take 25 mg by mouth at bedtime as needed for sleep.    [name] lisinopril (PRINIVIL,ZESTRIL) 10 MG tablet TAKE 1 TABLET DAILY 90 tablet 0  . metoprolol succinate (TOPROL-XL) 25 MG 24 hr tablet Take 25 mg by mouth 2 (two) times daily.     . nitroGLYCERIN (NITROSTAT) 0.4 MG SL tablet Place 0.4 mg under the tongue every 5 (five) minutes as needed for chest pain.    . rosuvastatin (CRESTOR) 40 MG tablet Take 40 mg by mouth daily.    . traMADol (ULTRAM) 50 MG tablet Take 50 mg by mouth every 6 (six) hours as needed for moderate pain.     . traZODone (DESYREL) 50 MG tablet Take 150 mg by mouth at bedtime.      No current facility-administered medications for this visit.    Allergies:   Cephalexin    Social History:  The patient  reports that she quit smoking about 25 years ago. Her smoking use included Cigarettes. She has a 20 pack-year smoking history. She has never used smokeless tobacco. She reports that she drinks alcohol. She reports that she does not use illicit drugs.   Family History:  The patient's    family history includes Coronary artery disease in her father; Heart disease in her mother and sister.    ROS:  Please see the history of present illness.   Otherwise, review of systems are positive for none.   All other systems are reviewed and negative.    PHYSICAL EXAM: VS:  BP 120/74  mmHg  Pulse 65  Ht 5\' 3"  (1.6 m)  Wt 176 lb 6.4 oz (80.015 kg)  BMI 31.26 kg/m2  SpO2 95% , BMI Body mass index is 31.26 kg/(m^2). GEN: Well nourished, well developed, in no acute distress Neck: no JVD, HJR, carotid bruits, or masses Cardiac: RRR; no murmurs,gallop, rubs, thrill or heave,  Respiratory:  clear to auscultation bilaterally, normal work of breathing GI: soft, nontender, nondistended, + BS MS: no deformity or atrophy Extremities: without cyanosis, clubbing, edema, good distal pulses bilaterally.  Skin: warm and dry, no rash Neuro:  Strength and sensation are intact    EKG:  EKG is not ordered today.    Recent Labs: 09/08/2014: BUN 16;  Creatinine, Ser 1.11*; Hemoglobin 11.0*; Platelets 122*; Potassium 4.5; Sodium 135    Lipid Panel    Component Value Date/Time   CHOL * 04/01/2009 1601    218        ATP III CLASSIFICATION:  <200     mg/dL   Desirable  200-239  mg/dL   Borderline High  >=240    mg/dL   High          TRIG 295* 04/01/2009 1601   HDL 33* 04/01/2009 1601   CHOLHDL 6.6 04/01/2009 1601   VLDL 59* 04/01/2009 1601   LDLCALC * 04/01/2009 1601    126        Total Cholesterol/HDL:CHD Risk Coronary Heart Disease Risk Table                     Men   Women  1/2 Average Risk   3.4   3.3  Average Risk       5.0   4.4  2 X Average Risk   9.6   7.1  3 X Average Risk  23.4   11.0        Use the calculated Patient Ratio above and the CHD Risk Table to determine the patient's CHD Risk.        ATP III CLASSIFICATION (LDL):  <100     mg/dL   Optimal  100-129  mg/dL   Near or Above                    Optimal  130-159  mg/dL   Borderline  160-189  mg/dL   High  >190     mg/dL   Very High      Wt Readings from Last 3 Encounters:  02/14/15 176 lb 6.4 oz (80.015 kg)  09/07/14 188 lb (85.276 kg)  08/30/14 188 lb (85.276 kg)      Other studies Reviewed: Additional studies/ records that were reviewed today include and review of the records  demonstrates:  - Echo (08/2011): EF 40-98%, grade 1 diastolic dysfunction.      - Event Monitor (06/2013):  NSR, No AFib.     - Myoview (06/2013):  No ischemia, EF 83%.  - LHC (05/2012):  Ostial Dx 30-40%, proximal mid RI 40-50%, RCA stent patent with 40-50% after stent, then 40%, distal RCA 40-50%, EF 55-65%. Medical Rx    ASSESSMENT AND PLAN: CAD, NATIVE VESSEL Stable without chest pain. Continue metoprolol, lisinopril and Crestor. Follow-up with Dr. Rayann Heman in 1 year  Essential hypertension, benign BP stable  HYPERLIPIDEMIA-MIXED Patient had lipid panel checked by primary in May. We'll try to get these results. Continue Crestor.  Atrial fibrillation Patient has had no recurrent palpitations. Her heart rate is regular on exam today. No EKG done.     Signed, Ermalinda Barrios, PA-C  02/14/2015 1:57 PM    [facility] [place], [place], [place]  [address] Phone: [phone]; Fax: [phone]

## 2015-02-14 NOTE — Assessment & Plan Note (Signed)
BP stable.

## 2015-02-14 NOTE — Patient Instructions (Signed)
Medication Instructions:  Refilled today 90 day supply; crestor,pradaxa,lisinopril, metoprolol  Labwork: -none  Testing/Procedures: -none  Follow-Up: Your physician wants you to follow-up in: 1 year with Dr. Rayann Heman. You will receive a reminder letter in the mail two months in advance. If you don't receive a letter, please call our office to schedule the follow-up appointment.    Any Other Special Instructions Will Be Listed Below (If Applicable).

## 2015-02-14 NOTE — Assessment & Plan Note (Signed)
Patient has had no recurrent palpitations. Her heart rate is regular on exam today. No EKG done.

## 2015-02-14 NOTE — Assessment & Plan Note (Signed)
Stable without chest pain. Continue metoprolol, lisinopril and Crestor. Follow-up with Dr. Rayann Heman in 1 year

## 2015-10-06 ENCOUNTER — Encounter: Payer: Self-pay | Admitting: Neurology

## 2015-10-06 ENCOUNTER — Ambulatory Visit (INDEPENDENT_AMBULATORY_CARE_PROVIDER_SITE_OTHER): Payer: Medicare Other | Admitting: Neurology

## 2015-10-06 VITALS — BP 152/88 | HR 72 | Resp 16 | Ht 63.0 in | Wt 187.0 lb

## 2015-10-06 DIAGNOSIS — G250 Essential tremor: Secondary | ICD-10-CM | POA: Diagnosis not present

## 2015-10-06 MED ORDER — PRIMIDONE 50 MG PO TABS: ORAL_TABLET | ORAL | Status: DC

## 2015-10-06 NOTE — Progress Notes (Signed)
Subjective:    Patient ID: [name] is a 70 y.o. female.  HPI     Star Age, MD, PhD [facility] [address], [address] [place], [place] [address]  Dear Quillian Quince,  I saw your patient, Timotea Roughton, upon your kind request in my  neurologic clinic today for initial consultation of [name] tremors. The patient is unaccompanied today. As you know, [name] is a 70 year old right-handed woman with an underlying medical history of coronary artery disease, status post stent placement, paroxysmal A. fib, status post ablation, chronic diarrhea, fecal incontinence, borderline diabetes, hypertension, reflux disease, kidney stones, arthritis, status post right total knee arthroplasty, recurrent UTIs, and obesity, who reports a long-standing history of head tremors and to a much lesser degree in [name] hands. She recalls that she had tremors when she was 89 or even 70 years old. She has a strong family history of tremors in both parents, younger sister who is much more affected by [name] tremor and both younger brothers, one had to retire early cousin of his tremor. [name] tremor does not bother [name] very much but she notices it from time to time, especially when she is fatigued. 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 Sometimes with [name] earrings she needs help to put them in. Otherwise, she has no significant difficulty with feeding or dressing, self hygiene or ADLs otherwise. She is married and lives with [name] husband. He has not mentioned any voice tremor to [name]. She does note [name] head tremor. She has 2 grown children. She drinks alcohol in the form of wine, usually once a month or so and has not noticed any improvement in [name] tremor or did not pay enough attention to it. She drinks coffee 1 cup a day, she quit smoking in 1990. She tries to stay active physically. She has not noticed much in the way of balance problems. She had left knee arthroscopic surgery in  November 2016 and right knee replacement surgery in March 2016. She still has some knee discomfort and is currently in home health physical therapy.     [name] Past Medical History Is Significant For: Past Medical History  Diagnosis Date  . Coronary artery disease     a.  s/p Xience DES to RCA 04/2009;   b. TEE 2/12: EF 40%, Large PFO;  c.  Lexiscan Myoview 05/2012: EF 69%, no ischemia. LHC (05/2012):  Ostial diagonal 30-40%, proximal mid ramus intermedius 40-50%, RCA stent patent with 40-50% after stent, then 40%, distal RCA 40-50%, EF 55-65%. Medical therapy continued.;  d.  Carlton Adam Myoview (06/2013):  No ischemia, EF 83%, normal study  . History of recurrent UTIs   . S/P drug eluting coronary stent placement   . History of basal cell carcinoma excision     scalp  . PAF (paroxysmal atrial fibrillation) ([place])   . S/P ablation of atrial fibrillation   . Chronic diarrhea   . Fecal incontinence   . Borderline diabetes   . Hypertension   . History of kidney stones 51 years ago    x1  . GERD (gastroesophageal reflux disease)     occasional  . Arthritis     osteoarthritis  . Tremor   . A-fib [facility])     [name] Past Surgical History Is Significant For: Past Surgical History  Procedure Laterality Date  . Carpal tunnel release Right 25 years ago  . Flexible sigmoidoscopy N/A 07/07/2013    Procedure:  FLEXIBLE SIGMOIDOSCOPY;  Surgeon: Winfield Cunas., MD;  Location: [facility];  Service: Endoscopy;  Laterality: N/A;  unprepped  . Rectal ultrasound N/A 01/11/2014    Procedure: RECTAL ULTRASOUND;  Surgeon: Leighton Ruff, MD;  Location: [facility];  Service: Endoscopy;  Laterality: N/A;  . Anal rectal manometry N/A 01/11/2014    Procedure: ANAL RECTAL MANOMETRY;  Surgeon: Leighton Ruff, MD;  Location: [facility];  Service: Endoscopy;  Laterality: N/A;  . Bunionectomy Bilateral 2013  . Coronary angioplasty with stent placement  04-04-2009  dr Darnell Level brodie    PCI and DES x1 to  mRCA/  mLAD  40%, pCX 30%, pRCA 30%/  normal LVF  . Cardiac catheterization  10-24-2010   dr allred    singl-vessel CAD with patent stent mRCA/  moderate disease mRCA beyond stent segment/  normal lvsf  . Transthoracic echocardiogram  09-19-2011    grade I diastolic dysfunction/  ef 55-60%/  mild TR  . Cardiovascular stress test  06-15-2013  dr allred    normal perfusion study/  no ischemia/  ef 83%  . Cardiac electrophysiology mapping and ablation  08-15-2010  dr allred  . Vaginal hysterectomy  1975    W/ BILATERAL SALPINOOPHOORECTOMY  . Cholecystectomy open  [place]  . Pilonidal cyst excision  2009;   2000;   1999  . Vagus nerve stimulator insertion N/A 03/10/2014    Procedure: IMPLANTATION OF SACRAL  NERVE STIMULATOR ;  Surgeon: Leighton Ruff, MD;  Location: [place];  Service: General;  Laterality: N/A;  Sacral. [employer]  . Left heart catheterization with coronary angiogram N/A 05/09/2012    Procedure: LEFT HEART CATHETERIZATION WITH CORONARY ANGIOGRAM;  Surgeon: Hillary Bow, MD;  Location: [facility] CATH LAB;  Service: Cardiovascular;  Laterality: N/A;  . Mohs surgery  ~6 years ago    scalp  . Appendectomy  1973  . Refractive surgery Bilateral 16 years ago  . Total knee arthroplasty Right 09/07/2014    Procedure: RIGHT TOTAL KNEE ARTHROPLASTY;  Surgeon: Paralee Cancel, MD;  Location: [facility];  Service: Orthopedics;  Laterality: Right;    [name] Family History Is Significant For: Family History  Problem Relation Age of Onset  . Coronary artery disease Father   . Heart disease Sister   . Heart disease Mother     [name] Social History Is Significant For: Social History   Social History  . Marital Status: Married    Spouse Name: N/A  . Number of Children: 2  . Years of Education: HS   Occupational History  . retired    Social History Main Topics  . Smoking status: Former Smoker -- 1.00 packs/day for 20 years    Types: Cigarettes    Quit date: 07/02/1989  .  Smokeless tobacco: Never Used  . Alcohol Use: 0.0 oz/week     Comment: wine occasionally  . Drug Use: No  . Sexual Activity: Not Asked   Other Topics Concern  . None   Social History Narrative    [name] Allergies Are:  Allergies  Allergen Reactions  . Cephalexin Nausea Only  :   [name] Current Medications Are:  Outpatient Encounter Prescriptions as of 10/06/2015  Medication Sig  . dabigatran (PRADAXA) 150 MG CAPS capsule Take 1 capsule (150 mg total) by mouth every 12 (twelve) hours.  . diphenhydrAMINE (BENADRYL) 25 mg capsule Take 25 mg by mouth at bedtime as needed for sleep.  [name] lisinopril (PRINIVIL,ZESTRIL) 10 MG tablet  Take 1 tablet (10 mg total) by mouth daily.  . metoprolol succinate (TOPROL-XL) 25 MG 24 hr tablet Take 1 tablet (25 mg total) by mouth 2 (two) times daily.  . nitroGLYCERIN (NITROSTAT) 0.4 MG SL tablet Place 0.4 mg under the tongue every 5 (five) minutes as needed for chest pain.  . rosuvastatin (CRESTOR) 40 MG tablet Take 1 tablet (40 mg total) by mouth daily.  . traMADol (ULTRAM) 50 MG tablet Take 50 mg by mouth every 6 (six) hours as needed for moderate pain.   . traZODone (DESYREL) 50 MG tablet Take 150 mg by mouth at bedtime.    No facility-administered encounter medications on file as of 10/06/2015.  :   Review of Systems:  Out of a complete 14 point review of systems, all are reviewed and negative with the exception of these symptoms as listed below:  Review of Systems  Neurological:       Patient reports that she has had tremors in [name] head since she was a teenager. States it is no better or worse.     Objective:  Neurologic Exam  Physical Exam Physical Examination:   Filed Vitals:   10/06/15 1302  BP: 152/88  Pulse: 72  Resp: 16    General Examination: The patient is a very pleasant 70 y.o. female in no acute distress. She appears well-developed and well-nourished and well groomed.   HEENT: Normocephalic, atraumatic, pupils are equal, round  and reactive to light and accommodation. Funduscopic exam is normal with sharp disc margins noted. Extraocular tracking is good without limitation to gaze excursion or nystagmus noted. Normal smooth pursuit is noted. Hearing is grossly intact. Tympanic membranes are clear bilaterally. Face is symmetric with normal facial animation and normal facial sensation. Speech is clear with no dysarthria noted. There is no hypophonia. There is no lip, or voice tremor. She has a mild head and neck tremor, side to side, no-no type. Neck is supple with full range of passive and active motion. There are no carotid bruits on auscultation. Oropharynx exam reveals: mild mouth dryness, adequate dental hygiene and mild airway crowding, due to smaller airway entry. Mallampati is class II. Tongue protrudes centrally and palate elevates symmetrically.   Chest: Clear to auscultation without wheezing, rhonchi or crackles noted.  Heart: S1+S2+0, regular and normal without murmurs, rubs or gallops noted.   Abdomen: Soft, non-tender and non-distended with normal bowel sounds appreciated on auscultation.  Extremities: There is no pitting edema in the distal lower extremities bilaterally. Pedal pulses are intact.  Skin: Warm and dry without trophic changes noted. There are no varicose veins.  Musculoskeletal: exam reveals no obvious joint deformities, tenderness or joint swelling or erythema, with the exception of arthritic changes in both hands, and mild discomfort in both knees, right knee range of motion is decreased as well.   Neurologically:  Mental status: The patient is awake, alert and oriented in all 4 spheres. [name] immediate and remote memory, attention, language skills and fund of knowledge are appropriate. There is no evidence of aphasia, agnosia, apraxia or anomia. Speech is clear with normal prosody and enunciation. Thought process is linear. Mood is normal and affect is normal.  Cranial nerves II - XII are as  described above under HEENT exam. In addition: shoulder shrug is normal with equal shoulder height noted. Motor exam: Normal bulk, strength and tone is noted. There is no drift, resting tremor or rebound. She has no significant postural tremor but has a mild action tremor  in both upper extremities. Handwriting is not tremulous and legible. She has no evidence of micrographia. On Archimedes spiral drawing she has mild course tremulousness bilaterally. Romberg is negative. Reflexes are 1-2+ throughout. fine motor skills and coordination: intact with normal finger taps, normal hand movements, normal rapid alternating patting, normal foot taps and normal foot agility.  Cerebellar testing: No dysmetria or intention tremor on finger to nose testing. Heel to shin is unremarkable bilaterally. There is no truncal or gait ataxia.  Sensory exam: intact to light touch, pinprick, vibration, temperature sense in the upper and lower extremities.  Gait, station and balance: She stands easily. No veering to one side is noted. No leaning to one side is noted. Posture is age-appropriate and stance is narrow based. Gait shows normal stride length and normal pace. No problems turning are noted. Tandem walk is difficult for [name], secondary to knee discomfort bilaterally.  Assessment and Plan:  Assessment and Plan:  In summary, [name] is a very pleasant 70 y.o.-year old female with an underlying medical history of coronary artery disease, status post stent placement, paroxysmal A. fib, status post ablation, chronic diarrhea, fecal incontinence, borderline diabetes, hypertension, reflux disease, kidney stones, arthritis, status post right total knee arthroplasty, recurrent UTIs, and obesity, who reports a long-standing history of head tremors and to a much lesser degree in [name] hands. She started noticing a tremor in [name] teenage years. She has a strong family history of tremors as well. [name] history and physical exam are in  keeping with essential tremor. Tremor overall is mild at this time, affecting [name] head more than [name] hands. She's not particularly impaired in [name] ADLs but would like to see if we can tone down [name] tremors. We talked about tremors tremor triggers an exacerbation at length today. I advised [name] that there is no specific treatment available and some limited medication that we can try. We mutually agreed to try [name] on low-dose Mysoline/primidone. I provided [name] with written instructions and a new prescription. Essentially, we will try Mysoline 50 mg strength half a pill each night with gradual buildup every 2 weeks to up to 100 mg each night eventually. I talked [name] about expectations, side effects and benefits. She is advised that midline tremor can be harder to treat than appendicular tremor. Nevertheless, I think it is worth trying medication for symptomatic treatment. She is in agreement. I reassured [name] that [name] history and physical exam do not suggest any other sinister neurological findings. I would like to check in with [name] in 4 months, sooner if needed. She is advised to call with any interim questions or concerns. I answered all [name] questions today and she was in agreement.  Thank you very much for allowing me to participate in the care of this nice patient. If I can be of any further assistance to you please do not hesitate to call me at [phone].  Sincerely,   Star Age, MD, PhD

## 2015-10-06 NOTE — Patient Instructions (Addendum)
  Please remember, that any kind of tremor may be exacerbated by anxiety, anger, nervousness, excitement, dehydration, sleep deprivation, by caffeine, and low blood sugar values or blood sugar fluctuations. Some medications, especially some antidepressants and lithium can cause or exacerbate tremors. Tremors may temporarily calm down her subside with the use of a benzodiazepine such as Valium or related medications and with alcohol. Be aware however that drinking alcohol is not an approved treatment or appropriate treatment for tremor control and long-term use of benzodiazepines such as Valium, lorazepam, alprazolam, or clonazepam can cause habit formation, physical and psychological addiction.  Your tremor is actually quite mild at this time, primarily in your head, as opposed to hands.   We will try low dose Mysoline (primidone) 50 mg strength: Take 1/2 pill each bedtime for 2 weeks, then 1 pill each bedtime for 2 weeks, then 1 1/2 pills each bedtime for 2 weeks, then 2 pills each bedtime thereafter. Common side effects reported are: Sleepiness, drowsiness, balance problems, confusion, and GI related symptoms.  Let's monitor your symptoms, follow up in 4 months.

## 2015-11-10 ENCOUNTER — Telehealth: Payer: Self-pay

## 2015-11-10 DIAGNOSIS — G250 Essential tremor: Secondary | ICD-10-CM

## 2015-11-10 MED ORDER — PRIMIDONE 50 MG PO TABS: 100.0000 mg | ORAL_TABLET | Freq: Every day | ORAL | Status: DC

## 2015-11-10 NOTE — Telephone Encounter (Signed)
90 day medication request from [facility].

## 2015-12-08 NOTE — H&P (Signed)
TOTAL KNEE ADMISSION H&P  Patient is being admitted for left total knee arthroplasty.  Subjective:  Chief Complaint:    Left knee primary OA / pain  HPI: [name], 70 y.o. female, has a history of pain and functional disability in the left knee due to arthritis and has failed non-surgical conservative treatments for greater than 12 weeks to include NSAID's and/or analgesics, corticosteriod injections, viscosupplementation injections and activity modification.  Onset of symptoms was gradual, starting >10 years ago with gradually worsening course since that time. The patient noted prior procedures on the knee to include  arthroscopy on the left knee(s).  Patient currently rates pain in the left knee(s) at 8 out of 10 with activity. Patient has night pain, worsening of pain with activity and weight bearing, pain that interferes with activities of daily living, pain with passive range of motion, crepitus and joint swelling.  Patient has evidence of periarticular osteophytes and joint space narrowing by imaging studies. There is no active infection.   Risks, benefits and expectations were discussed with the patient.  Risks including but not limited to the risk of anesthesia, blood clots, nerve damage, blood vessel damage, failure of the prosthesis, infection and up to and including death.  Patient understand the risks, benefits and expectations and wishes to proceed with surgery.   PCP: Fransisca Connors, PA-C  D/C Plans:      Home  Post-op Meds:       No Rx given   Tranexamic Acid:      To be given - IV   Decadron:      Is to be given  FYI:     Pradaxa  Norco    Patient Active Problem List   Diagnosis Date Noted  . Obese 09/08/2014  . S/P right TKA 09/07/2014  . S/P knee replacement 09/07/2014  . CHF (congestive heart failure) ([place]) 07/15/2013  . Precordial pain 10/19/2010  . EDEMA 12/16/2009  . Atrial fibrillation ([place]) 08/01/2009  . PULMONARY NODULE, LEFT LOWER LOBE 04/28/2009  .  DIABETES MELLITUS, TYPE II 04/27/2009  . 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 SKIN CANCER, HX OF 04/27/2009  . HYPERLIPIDEMIA-MIXED 04/11/2009  . CAD, NATIVE VESSEL 04/11/2009  . DYSPNEA 04/11/2009  . HEARTBURN 04/11/2009  . Essential hypertension, benign 04/01/2009  . ANGINA, UNSTABLE 04/01/2009   Past Medical History  Diagnosis Date  . Coronary artery disease     a.  s/p Xience DES to RCA 04/2009;   b. TEE 2/12: EF 40%, Large PFO;  c.  Lexiscan Myoview 05/2012: EF 69%, no ischemia. LHC (05/2012):  Ostial diagonal 30-40%, proximal mid ramus intermedius 40-50%, RCA stent patent with 40-50% after stent, then 40%, distal RCA 40-50%, EF 55-65%. Medical therapy continued.;  d.  Carlton Adam Myoview (06/2013):  No ischemia, EF 83%, normal study  . History of recurrent UTIs   . S/P drug eluting coronary stent placement   . History of basal cell carcinoma excision     scalp  . PAF (paroxysmal atrial fibrillation) ([place])   . S/P ablation of atrial fibrillation   . Chronic diarrhea   . Fecal incontinence   . Borderline diabetes   . Hypertension   . History of kidney stones 51 years ago    x1  . GERD (gastroesophageal reflux disease)     occasional  . Arthritis     osteoarthritis  . Tremor   . A-fib [facility])     Past Surgical History  Procedure Laterality Date  .  Carpal tunnel release Right 25 years ago  . Flexible sigmoidoscopy N/A 07/07/2013    Procedure: FLEXIBLE SIGMOIDOSCOPY;  Surgeon: Winfield Cunas., MD;  Location: [facility];  Service: Endoscopy;  Laterality: N/A;  unprepped  . Rectal ultrasound N/A 01/11/2014    Procedure: RECTAL ULTRASOUND;  Surgeon: Leighton Ruff, MD;  Location: [facility] ENDOSCOPY;  Service: Endoscopy;  Laterality: N/A;  . Anal rectal manometry N/A 01/11/2014    Procedure: ANAL RECTAL MANOMETRY;  Surgeon: Leighton Ruff, MD;  Location: [facility] ENDOSCOPY;  Service: Endoscopy;  Laterality: N/A;  . Bunionectomy Bilateral 2013  . Coronary angioplasty with  stent placement  04-04-2009  dr Darnell Level brodie    PCI and DES x1 to  mRCA/  mLAD 40%, pCX 30%, pRCA 30%/  normal LVF  . Cardiac catheterization  10-24-2010   dr allred    singl-vessel CAD with patent stent mRCA/  moderate disease mRCA beyond stent segment/  normal lvsf  . Transthoracic echocardiogram  09-19-2011    grade I diastolic dysfunction/  ef 55-60%/  mild TR  . Cardiovascular stress test  06-15-2013  dr allred    normal perfusion study/  no ischemia/  ef 83%  . Cardiac electrophysiology mapping and ablation  08-15-2010  dr allred  . Vaginal hysterectomy  1975    W/ BILATERAL SALPINOOPHOORECTOMY  . Cholecystectomy open  [place]  . Pilonidal cyst excision  2009;   2000;   1999  . Vagus nerve stimulator insertion N/A 03/10/2014    Procedure: IMPLANTATION OF SACRAL  NERVE STIMULATOR ;  Surgeon: Leighton Ruff, MD;  Location: [place];  Service: General;  Laterality: N/A;  Sacral. [employer]  . Left heart catheterization with coronary angiogram N/A 05/09/2012    Procedure: LEFT HEART CATHETERIZATION WITH CORONARY ANGIOGRAM;  Surgeon: Hillary Bow, MD;  Location: [facility] CATH LAB;  Service: Cardiovascular;  Laterality: N/A;  . Mohs surgery  ~6 years ago    scalp  . Appendectomy  1973  . Refractive surgery Bilateral 16 years ago  . Total knee arthroplasty Right 09/07/2014    Procedure: RIGHT TOTAL KNEE ARTHROPLASTY;  Surgeon: Paralee Cancel, MD;  Location: [facility] ORS;  Service: Orthopedics;  Laterality: Right;    No prescriptions prior to admission   Allergies  Allergen Reactions  . Cephalexin Nausea Only    Social History  Substance Use Topics  . Smoking status: Former Smoker -- 1.00 packs/day for 20 years    Types: Cigarettes    Quit date: 07/02/1989  . Smokeless tobacco: Never Used  . Alcohol Use: 0.0 oz/week     Comment: wine occasionally    Family History  Problem Relation Age of Onset  . Coronary artery disease Father   . Heart disease Sister   .  Heart disease Mother      Review of Systems  Constitutional: Negative.   HENT: Negative.   Eyes: Negative.   Respiratory: Negative.   Cardiovascular: Negative.   Gastrointestinal: Positive for heartburn.  Genitourinary: Negative.   Musculoskeletal: Positive for joint pain.  Skin: Negative.   Neurological: Negative.   Endo/Heme/Allergies: Negative.   Psychiatric/Behavioral: Negative.     Objective:  Physical Exam  Constitutional: She is oriented to person, place, and time. She appears well-developed.  HENT:  Head: Normocephalic.  Eyes: Pupils are equal, round, and reactive to light.  Neck: Neck supple. No JVD present. No tracheal deviation present. No thyromegaly present.  Cardiovascular: Normal rate, regular rhythm, normal heart sounds  and intact distal pulses.   Respiratory: Effort normal and breath sounds normal. No stridor. No respiratory distress. She has no wheezes.  GI: Soft. There is no tenderness. There is no guarding.  Musculoskeletal:       Left knee: She exhibits decreased range of motion, swelling and bony tenderness. She exhibits no ecchymosis, no deformity, no laceration and no erythema. Tenderness found.  Lymphadenopathy:    She has no cervical adenopathy.  Neurological: She is alert and oriented to person, place, and time.  Skin: Skin is warm and dry.  Psychiatric: She has a normal mood and affect.       Labs:  Estimated body mass index is 33.13 kg/(m^2) as calculated from the following:   Height as of 10/06/15: 5\' 3"  (1.6 m).   Weight as of 10/06/15: 84.823 kg (187 lb).   Imaging Review Plain radiographs demonstrate severe degenerative joint disease of the left knee(s).  The bone quality appears to be good for age and reported activity level.  Assessment/Plan:  End stage arthritis, left knee   The patient history, physical examination, clinical judgment of the provider and imaging studies are consistent with end stage degenerative joint disease of  the left knee(s) and total knee arthroplasty is deemed medically necessary. The treatment options including medical management, injection therapy arthroscopy and arthroplasty were discussed at length. The risks and benefits of total knee arthroplasty were presented and reviewed. The risks due to aseptic loosening, infection, stiffness, patella tracking problems, thromboembolic complications and other imponderables were discussed. The patient acknowledged the explanation, agreed to proceed with the plan and consent was signed. Patient is being admitted for inpatient treatment for surgery, pain control, PT, OT, prophylactic antibiotics, VTE prophylaxis, progressive ambulation and ADL's and discharge planning. The patient is planning to be discharged home.      [place] Eveny Anastas   PA-C  12/08/2015, 9:25 AM

## 2015-12-14 ENCOUNTER — Encounter (HOSPITAL_COMMUNITY)
Admission: RE | Admit: 2015-12-14 | Discharge: 2015-12-14 | Disposition: A | Discharge status: Home / Self Care | Payer: Medicare Other | Source: Ambulatory Visit | Attending: Orthopedic Surgery | Admitting: Orthopedic Surgery

## 2015-12-14 ENCOUNTER — Other Ambulatory Visit: Payer: Self-pay

## 2015-12-14 ENCOUNTER — Encounter (HOSPITAL_COMMUNITY): Payer: Self-pay

## 2015-12-14 DIAGNOSIS — Z0183 Encounter for blood typing: Secondary | ICD-10-CM | POA: Insufficient documentation

## 2015-12-14 DIAGNOSIS — E782 Mixed hyperlipidemia: Secondary | ICD-10-CM | POA: Insufficient documentation

## 2015-12-14 DIAGNOSIS — M1712 Unilateral primary osteoarthritis, left knee: Secondary | ICD-10-CM | POA: Diagnosis not present

## 2015-12-14 DIAGNOSIS — Z8249 Family history of ischemic heart disease and other diseases of the circulatory system: Secondary | ICD-10-CM | POA: Diagnosis not present

## 2015-12-14 DIAGNOSIS — Z87891 Personal history of nicotine dependence: Secondary | ICD-10-CM | POA: Insufficient documentation

## 2015-12-14 DIAGNOSIS — I2510 Atherosclerotic heart disease of native coronary artery without angina pectoris: Secondary | ICD-10-CM | POA: Diagnosis not present

## 2015-12-14 DIAGNOSIS — E119 Type 2 diabetes mellitus without complications: Secondary | ICD-10-CM | POA: Diagnosis not present

## 2015-12-14 DIAGNOSIS — I4891 Unspecified atrial fibrillation: Secondary | ICD-10-CM | POA: Insufficient documentation

## 2015-12-14 DIAGNOSIS — Z01818 Encounter for other preprocedural examination: Secondary | ICD-10-CM | POA: Insufficient documentation

## 2015-12-14 DIAGNOSIS — I10 Essential (primary) hypertension: Secondary | ICD-10-CM | POA: Insufficient documentation

## 2015-12-14 DIAGNOSIS — Z01812 Encounter for preprocedural laboratory examination: Secondary | ICD-10-CM | POA: Insufficient documentation

## 2015-12-14 DIAGNOSIS — R9431 Abnormal electrocardiogram [ECG] [EKG]: Secondary | ICD-10-CM | POA: Insufficient documentation

## 2015-12-14 HISTORY — DX: Presence of spectacles and contact lenses: Z97.3

## 2015-12-14 HISTORY — DX: Personal history of other diseases of the digestive system: Z87.19

## 2015-12-14 LAB — CBC
HCT: 38.5 % (ref 36.0–46.0)
Hemoglobin: 13 g/dL (ref 12.0–15.0)
MCH: 31.6 pg (ref 26.0–34.0)
MCHC: 33.8 g/dL (ref 30.0–36.0)
MCV: 93.4 fL (ref 78.0–100.0)
PLATELETS: 152 10*3/uL (ref 150–400)
RBC: 4.12 MIL/uL (ref 3.87–5.11)
RDW: 12.9 % (ref 11.5–15.5)
WBC: 4.9 10*3/uL (ref 4.0–10.5)

## 2015-12-14 LAB — BASIC METABOLIC PANEL
Anion gap: 6 (ref 5–15)
BUN: 21 mg/dL — AB (ref 6–20)
CALCIUM: 9.2 mg/dL (ref 8.9–10.3)
CHLORIDE: 103 mmol/L (ref 101–111)
CO2: 28 mmol/L (ref 22–32)
CREATININE: 1.06 mg/dL — AB (ref 0.44–1.00)
GFR calc non Af Amer: 52 mL/min — ABNORMAL LOW (ref >60)
GFR, EST AFRICAN AMERICAN: 60 mL/min — AB (ref >60)
GLUCOSE: 109 mg/dL — AB (ref 65–99)
Potassium: 4.4 mmol/L (ref 3.5–5.1)
Sodium: 137 mmol/L (ref 135–145)

## 2015-12-14 LAB — PROTIME-INR
INR: 1.35 (ref 0.00–1.49)
Prothrombin Time: 16.3 seconds — ABNORMAL HIGH (ref 11.6–15.2)

## 2015-12-14 LAB — APTT: APTT: 43 s — AB (ref 24–37)

## 2015-12-14 LAB — SURGICAL PCR SCREEN
MRSA, PCR: NEGATIVE
Staphylococcus aureus: NEGATIVE

## 2015-12-14 NOTE — Progress Notes (Addendum)
Clearance note per chart per Dr Rayann Heman; pt aware of pradaxa instructions. LOV per cardiology per chart 02/14/2015

## 2015-12-14 NOTE — Patient Instructions (Signed)
IRIANNA MESSANO  12/14/2015   Your procedure is scheduled on: Tuesday December 20, 2015  Report to [facility]  Entrance take [place]  elevators to 3rd floor to  [place] at 9:45 AM.  Call this number if you have problems the morning of surgery [phone]   Remember: ONLY 1 PERSON MAY GO WITH YOU TO SHORT STAY TO GET  READY MORNING OF [place].  Do not eat food or drink liquids :After Midnight.     Take these medicines the morning of surgery with A SIP OF WATER: Pantoprazole (Protonix)                               You may not have any metal on your body including hair pins and              piercings  Do not wear jewelry,  lotions, powders or colognes, deodorant             Do not wear nail polish.  Do not shave  48 hours prior to surgery.                Do not bring valuables to the hospital. [place].  Contacts, dentures or bridgework may not be worn into surgery.  Leave suitcase in the car. After surgery it may be brought to your room.                Please read over the following fact sheets you were given:MRSA INFORMATION SHEET; INCENTIVE SPIROMETER; BLOOD TRANSFUSION INFORMATION  _____________________________________________________________________             [facility] - Preparing for Surgery Before surgery, you can play an important role.  Because skin is not sterile, your skin needs to be as free of germs as possible.  You can reduce the number of germs on your skin by washing with CHG (chlorahexidine gluconate) soap before surgery.  CHG is an antiseptic cleaner which kills germs and bonds with the skin to continue killing germs even after washing. Please DO NOT use if you have an allergy to CHG or antibacterial soaps.  If your skin becomes reddened/irritated stop using the CHG and inform your nurse when you arrive at Short Stay. Do not shave (including legs and underarms) for at least  48 hours prior to the first CHG shower.  You may shave your face/neck. Please follow these instructions carefully:  1.  Shower with CHG Soap the night before surgery and the  morning of Surgery.  2.  If you choose to wash your hair, wash your hair first as usual with your  normal  shampoo.  3.  After you shampoo, rinse your hair and body thoroughly to remove the  shampoo.                           4.  Use CHG as you would any other liquid soap.  You can apply chg directly  to the skin and wash                       Gently with a scrungie or clean washcloth.  5.  Apply  the CHG Soap to your body ONLY FROM THE NECK DOWN.   Do not use on face/ open                           Wound or open sores. Avoid contact with eyes, ears mouth and genitals (private parts).                       Wash face,  Genitals (private parts) with your normal soap.             6.  Wash thoroughly, paying special attention to the area where your surgery  will be performed.  7.  Thoroughly rinse your body with warm water from the neck down.  8.  DO NOT shower/wash with your normal soap after using and rinsing off  the CHG Soap.                9.  Pat yourself dry with a clean towel.            10.  Wear clean pajamas.            11.  Place clean sheets on your bed the night of your first shower and do not  sleep with pets. Day of Surgery : Do not apply any lotions/deodorants the morning of surgery.  Please wear clean clothes to the hospital/surgery center.  FAILURE TO FOLLOW THESE INSTRUCTIONS MAY RESULT IN THE CANCELLATION OF YOUR SURGERY PATIENT SIGNATURE_________________________________  NURSE SIGNATURE__________________________________  ________________________________________________________________________   Adam [place]  An incentive spirometer is a tool that can help keep your lungs clear and active. This tool measures how well you are filling your lungs with each breath. Taking long deep breaths may  help reverse or decrease the chance of developing breathing (pulmonary) problems (especially infection) following:  A long period of time when you are unable to move or be active. BEFORE THE PROCEDURE   If the spirometer includes an indicator to show your best effort, your nurse or respiratory therapist will set it to a desired goal.  If possible, sit up straight or lean slightly forward. Try not to slouch.  Hold the incentive spirometer in an upright position. INSTRUCTIONS FOR USE   Sit on the edge of your bed if possible, or sit up as far as you can in bed or on a chair.  Hold the incentive spirometer in an upright position.  Breathe out normally.  Place the mouthpiece in your mouth and seal your lips tightly around it.  Breathe in slowly and as deeply as possible, raising the piston or the ball toward the top of the column.  Hold your breath for 3-5 seconds or for as long as possible. Allow the piston or ball to fall to the bottom of the column.  Remove the mouthpiece from your mouth and breathe out normally.  Rest for a few seconds and repeat Steps 1 through 7 at least 10 times every 1-2 hours when you are awake. Take your time and take a few normal breaths between deep breaths.  The spirometer may include an indicator to show your best effort. Use the indicator as a goal to work toward during each repetition.  After each set of 10 deep breaths, practice coughing to be sure your lungs are clear. If you have an incision (the cut made at the time of surgery), support your incision when coughing by placing a pillow or rolled  up towels firmly against it. Once you are able to get out of bed, walk around indoors and cough well. You may stop using the incentive spirometer when instructed by your caregiver.  RISKS AND COMPLICATIONS  Take your time so you do not get dizzy or light-headed.  If you are in pain, you may need to take or ask for pain medication before doing incentive  spirometry. It is harder to take a deep breath if you are having pain. AFTER USE  Rest and breathe slowly and easily.  It can be helpful to keep track of a log of your progress. Your caregiver can provide you with a simple table to help with this. If you are using the spirometer at home, follow these instructions: [place] IF:   You are having difficultly using the spirometer.  You have trouble using the spirometer as often as instructed.  Your pain medication is not giving enough relief while using the spirometer.  You develop fever of 100.5 F (38.1 C) or higher. SEEK IMMEDIATE MEDICAL CARE IF:   You cough up bloody sputum that had not been present before.  You develop fever of 102 F (38.9 C) or greater.  You develop worsening pain at or near the incision site. MAKE SURE YOU:   Understand these instructions.  Will watch your condition.  Will get help right away if you are not doing well or get worse. Document Released: 10/29/2006 Document Revised: 09/10/2011 Document Reviewed: 12/30/2006 ExitCare Patient Information 2014 Exit[employer], [place].   ________________________________________________________________________  WHAT IS A BLOOD TRANSFUSION? Blood Transfusion Information  A transfusion is the replacement of blood or some of its parts. Blood is made up of multiple cells which provide different functions.  Red blood cells carry oxygen and are used for blood loss replacement.  White blood cells fight against infection.  Platelets control bleeding.  Plasma helps clot blood.  Other blood products are available for specialized needs, such as hemophilia or other clotting disorders. BEFORE THE TRANSFUSION  Who gives blood for transfusions?   Healthy volunteers who are fully evaluated to make sure their blood is safe. This is blood bank blood. Transfusion therapy is the safest it has ever been in the practice of medicine. Before blood is taken from a donor, a  complete history is taken to make sure that person has no history of diseases nor engages in risky social behavior (examples are intravenous drug use or sexual activity with multiple partners). The donor's travel history is screened to minimize risk of transmitting infections, such as malaria. The donated blood is tested for signs of infectious diseases, such as HIV and hepatitis. The blood is then tested to be sure it is compatible with you in order to minimize the chance of a transfusion reaction. If you or a relative donates blood, this is often done in anticipation of surgery and is not appropriate for emergency situations. It takes many days to process the donated blood. RISKS AND COMPLICATIONS Although transfusion therapy is very safe and saves many lives, the main dangers of transfusion include:   Getting an infectious disease.  Developing a transfusion reaction. This is an allergic reaction to something in the blood you were given. Every precaution is taken to prevent this. The decision to have a blood transfusion has been considered carefully by your caregiver before blood is given. Blood is not given unless the benefits outweigh the risks. AFTER THE TRANSFUSION  Right after receiving a blood transfusion, you will usually feel  much better and more energetic. This is especially true if your red blood cells have gotten low (anemic). The transfusion raises the level of the red blood cells which carry oxygen, and this usually causes an energy increase.  The nurse administering the transfusion will monitor you carefully for complications. HOME CARE INSTRUCTIONS  No special instructions are needed after a transfusion. You may find your energy is better. Speak with your caregiver about any limitations on activity for underlying diseases you may have. SEEK MEDICAL CARE IF:   Your condition is not improving after your transfusion.  You develop redness or irritation at the intravenous (IV)  site. SEEK IMMEDIATE MEDICAL CARE IF:  Any of the following symptoms occur over the next 12 hours:  Shaking chills.  You have a temperature by mouth above 102 F (38.9 C), not controlled by medicine.  Chest, back, or muscle pain.  People around you feel you are not acting correctly or are confused.  Shortness of breath or difficulty breathing.  Dizziness and fainting.  You get a rash or develop hives.  You have a decrease in urine output.  Your urine turns a dark color or changes to pink, red, or brown. Any of the following symptoms occur over the next 10 days:  You have a temperature by mouth above 102 F (38.9 C), not controlled by medicine.  Shortness of breath.  Weakness after normal activity.  The white part of the eye turns yellow (jaundice).  You have a decrease in the amount of urine or are urinating less often.  Your urine turns a dark color or changes to pink, red, or brown. Document Released: 06/15/2000 Document Revised: 09/10/2011 Document Reviewed: 02/02/2008 [facility] Patient Information 2014 [place], [place].  _______________________________________________________________________

## 2015-12-15 LAB — HEMOGLOBIN A1C
HEMOGLOBIN A1C: 5.5 % (ref 4.8–5.6)
Mean Plasma Glucose: 111 mg/dL

## 2015-12-16 ENCOUNTER — Other Ambulatory Visit: Payer: Self-pay | Admitting: *Deleted

## 2015-12-16 NOTE — Telephone Encounter (Signed)
error 

## 2015-12-20 ENCOUNTER — Inpatient Hospital Stay (HOSPITAL_COMMUNITY): Payer: Medicare Other | Admitting: Certified Registered Nurse Anesthetist

## 2015-12-20 ENCOUNTER — Encounter (HOSPITAL_COMMUNITY)
Admission: RE | Disposition: A | Discharge status: Home Health | Payer: Self-pay | Source: Ambulatory Visit | Attending: Orthopedic Surgery

## 2015-12-20 ENCOUNTER — Inpatient Hospital Stay (HOSPITAL_COMMUNITY)
Admission: RE | Admit: 2015-12-20 | Discharge: 2015-12-21 | DRG: 470 | Disposition: A | Discharge status: Home Health | Payer: Medicare Other | Source: Ambulatory Visit | Attending: Orthopedic Surgery | Admitting: Orthopedic Surgery

## 2015-12-20 ENCOUNTER — Encounter (HOSPITAL_COMMUNITY): Payer: Self-pay | Admitting: *Deleted

## 2015-12-20 DIAGNOSIS — I10 Essential (primary) hypertension: Secondary | ICD-10-CM | POA: Diagnosis present

## 2015-12-20 DIAGNOSIS — E669 Obesity, unspecified: Secondary | ICD-10-CM | POA: Diagnosis present

## 2015-12-20 DIAGNOSIS — M659 Synovitis and tenosynovitis, unspecified: Secondary | ICD-10-CM | POA: Diagnosis present

## 2015-12-20 DIAGNOSIS — I480 Paroxysmal atrial fibrillation: Secondary | ICD-10-CM | POA: Diagnosis present

## 2015-12-20 DIAGNOSIS — K219 Gastro-esophageal reflux disease without esophagitis: Secondary | ICD-10-CM | POA: Diagnosis present

## 2015-12-20 DIAGNOSIS — R7303 Prediabetes: Secondary | ICD-10-CM | POA: Diagnosis present

## 2015-12-20 DIAGNOSIS — I2510 Atherosclerotic heart disease of native coronary artery without angina pectoris: Secondary | ICD-10-CM | POA: Diagnosis present

## 2015-12-20 DIAGNOSIS — Z96651 Presence of right artificial knee joint: Secondary | ICD-10-CM | POA: Diagnosis present

## 2015-12-20 DIAGNOSIS — Z96652 Presence of left artificial knee joint: Secondary | ICD-10-CM

## 2015-12-20 DIAGNOSIS — Z6834 Body mass index (BMI) 34.0-34.9, adult: Secondary | ICD-10-CM

## 2015-12-20 DIAGNOSIS — Z96659 Presence of unspecified artificial knee joint: Secondary | ICD-10-CM

## 2015-12-20 DIAGNOSIS — M1712 Unilateral primary osteoarthritis, left knee: Secondary | ICD-10-CM | POA: Diagnosis present

## 2015-12-20 DIAGNOSIS — Z85828 Personal history of other malignant neoplasm of skin: Secondary | ICD-10-CM

## 2015-12-20 DIAGNOSIS — Z87891 Personal history of nicotine dependence: Secondary | ICD-10-CM | POA: Diagnosis not present

## 2015-12-20 DIAGNOSIS — M25762 Osteophyte, left knee: Secondary | ICD-10-CM | POA: Diagnosis present

## 2015-12-20 DIAGNOSIS — Z955 Presence of coronary angioplasty implant and graft: Secondary | ICD-10-CM

## 2015-12-20 HISTORY — PX: TOTAL KNEE ARTHROPLASTY: SHX125

## 2015-12-20 LAB — TYPE AND SCREEN
ABO/RH(D): A POS
ANTIBODY SCREEN: NEGATIVE

## 2015-12-20 LAB — GLUCOSE, CAPILLARY: Glucose-Capillary: 111 mg/dL — ABNORMAL HIGH (ref 65–99)

## 2015-12-20 LAB — PROTIME-INR
INR: 1.07 (ref 0.00–1.49)
Prothrombin Time: 14.1 seconds (ref 11.6–15.2)

## 2015-12-20 LAB — APTT: APTT: 27 s (ref 24–37)

## 2015-12-20 SURGERY — ARTHROPLASTY, KNEE, TOTAL
Anesthesia: Monitor Anesthesia Care | Site: Knee | Laterality: Left

## 2015-12-20 MED ORDER — METOCLOPRAMIDE HCL 5 MG PO TABS: 5.0000 mg | ORAL_TABLET | Freq: Three times a day (TID) | ORAL | Status: DC | PRN

## 2015-12-20 MED ORDER — HYDROCODONE-ACETAMINOPHEN 7.5-325 MG PO TABS
1.0000 | ORAL_TABLET | ORAL | Status: DC
  Administered 2015-12-20 – 2015-12-21 (×5): 2 via ORAL
  Filled 2015-12-20 (×5): qty 2

## 2015-12-20 MED ORDER — DOCUSATE SODIUM 100 MG PO CAPS
100.0000 mg | ORAL_CAPSULE | Freq: Two times a day (BID) | ORAL | Status: DC
  Administered 2015-12-20 – 2015-12-21 (×2): 100 mg via ORAL
  Filled 2015-12-20 (×2): qty 1

## 2015-12-20 MED ORDER — METHOCARBAMOL 500 MG PO TABS
500.0000 mg | ORAL_TABLET | Freq: Four times a day (QID) | ORAL | Status: DC | PRN
  Administered 2015-12-21: 500 mg via ORAL
  Filled 2015-12-20: qty 1

## 2015-12-20 MED ORDER — SODIUM CHLORIDE 0.9 % IV SOLN
INTRAVENOUS | Status: DC
  Administered 2015-12-20: 17:00:00 via INTRAVENOUS

## 2015-12-20 MED ORDER — CHLORHEXIDINE GLUCONATE 4 % EX LIQD: 60.0000 mL | Freq: Once | CUTANEOUS | Status: DC

## 2015-12-20 MED ORDER — LIDOCAINE HCL (CARDIAC) 20 MG/ML IV SOLN
INTRAVENOUS | Status: DC | PRN
  Administered 2015-12-20: 80 mg via INTRAVENOUS

## 2015-12-20 MED ORDER — TRANEXAMIC ACID 1000 MG/10ML IV SOLN
1000.0000 mg | Freq: Once | INTRAVENOUS | Status: AC
  Administered 2015-12-20: 1000 mg via INTRAVENOUS
  Filled 2015-12-20: qty 10

## 2015-12-20 MED ORDER — FENTANYL CITRATE (PF) 100 MCG/2ML IJ SOLN
INTRAMUSCULAR | Status: AC
  Filled 2015-12-20: qty 2

## 2015-12-20 MED ORDER — LABETALOL HCL 5 MG/ML IV SOLN
INTRAVENOUS | Status: DC | PRN
  Administered 2015-12-20 (×3): 5 mg via INTRAVENOUS

## 2015-12-20 MED ORDER — METOCLOPRAMIDE HCL 5 MG/ML IJ SOLN: 5.0000 mg | Freq: Three times a day (TID) | INTRAMUSCULAR | Status: DC | PRN

## 2015-12-20 MED ORDER — ONDANSETRON HCL 4 MG/2ML IJ SOLN: 4.0000 mg | Freq: Four times a day (QID) | INTRAMUSCULAR | Status: DC | PRN

## 2015-12-20 MED ORDER — MAGNESIUM CITRATE PO SOLN: 1.0000 | Freq: Once | ORAL | Status: DC | PRN

## 2015-12-20 MED ORDER — METHOCARBAMOL 1000 MG/10ML IJ SOLN
500.0000 mg | Freq: Four times a day (QID) | INTRAVENOUS | Status: DC | PRN
  Administered 2015-12-20: 500 mg via INTRAVENOUS
  Filled 2015-12-20: qty 5
  Filled 2015-12-20: qty 550

## 2015-12-20 MED ORDER — PROPOFOL 500 MG/50ML IV EMUL
INTRAVENOUS | Status: DC | PRN
  Administered 2015-12-20: 50 ug/kg/min via INTRAVENOUS

## 2015-12-20 MED ORDER — PRIMIDONE 50 MG PO TABS
100.0000 mg | ORAL_TABLET | Freq: Every day | ORAL | Status: DC
  Administered 2015-12-20: 100 mg via ORAL
  Filled 2015-12-20 (×2): qty 2

## 2015-12-20 MED ORDER — HYDROMORPHONE HCL 1 MG/ML IJ SOLN
INTRAMUSCULAR | Status: AC
  Filled 2015-12-20: qty 1

## 2015-12-20 MED ORDER — ALUM & MAG HYDROXIDE-SIMETH 200-200-20 MG/5ML PO SUSP: 30.0000 mL | ORAL | Status: DC | PRN

## 2015-12-20 MED ORDER — OXYCODONE HCL 5 MG/5ML PO SOLN: 5.0000 mg | Freq: Once | ORAL | Status: DC | PRN

## 2015-12-20 MED ORDER — KETOROLAC TROMETHAMINE 30 MG/ML IJ SOLN
INTRAMUSCULAR | Status: AC
  Filled 2015-12-20: qty 1

## 2015-12-20 MED ORDER — MIDAZOLAM HCL 2 MG/2ML IJ SOLN
INTRAMUSCULAR | Status: AC
  Filled 2015-12-20: qty 2

## 2015-12-20 MED ORDER — NITROGLYCERIN 0.4 MG SL SUBL: 0.4000 mg | SUBLINGUAL_TABLET | SUBLINGUAL | Status: DC | PRN

## 2015-12-20 MED ORDER — POLYETHYLENE GLYCOL 3350 17 G PO PACK: 17.0000 g | PACK | Freq: Two times a day (BID) | ORAL | Status: DC

## 2015-12-20 MED ORDER — SODIUM CHLORIDE 0.9 % IV SOLN
1000.0000 mg | Freq: Once | INTRAVENOUS | Status: AC
  Administered 2015-12-20: 1000 mg via INTRAVENOUS
  Filled 2015-12-20: qty 10

## 2015-12-20 MED ORDER — HYDROMORPHONE HCL 1 MG/ML IJ SOLN
0.5000 mg | INTRAMUSCULAR | Status: DC | PRN
  Administered 2015-12-20: 1 mg via INTRAVENOUS
  Filled 2015-12-20: qty 1

## 2015-12-20 MED ORDER — BUPIVACAINE IN DEXTROSE 0.75-8.25 % IT SOLN
INTRATHECAL | Status: DC | PRN
  Administered 2015-12-20: 1.8 mL via INTRATHECAL

## 2015-12-20 MED ORDER — CELECOXIB 200 MG PO CAPS
200.0000 mg | ORAL_CAPSULE | Freq: Two times a day (BID) | ORAL | Status: DC
  Administered 2015-12-20 – 2015-12-21 (×2): 200 mg via ORAL
  Filled 2015-12-20 (×2): qty 1

## 2015-12-20 MED ORDER — DEXAMETHASONE SODIUM PHOSPHATE 10 MG/ML IJ SOLN
10.0000 mg | Freq: Once | INTRAMUSCULAR | Status: AC
Start: 2015-12-20 — End: 2015-12-20
  Administered 2015-12-20: 10 mg via INTRAVENOUS

## 2015-12-20 MED ORDER — PANTOPRAZOLE SODIUM 40 MG PO TBEC
40.0000 mg | DELAYED_RELEASE_TABLET | Freq: Every day | ORAL | Status: DC
  Administered 2015-12-21: 40 mg via ORAL
  Filled 2015-12-20: qty 1

## 2015-12-20 MED ORDER — LIDOCAINE HCL (CARDIAC) 20 MG/ML IV SOLN
INTRAVENOUS | Status: AC
  Filled 2015-12-20: qty 5

## 2015-12-20 MED ORDER — TRAZODONE HCL 50 MG PO TABS
150.0000 mg | ORAL_TABLET | Freq: Every day | ORAL | Status: DC
Start: 2015-12-20 — End: 2015-12-21
  Administered 2015-12-20: 150 mg via ORAL
  Filled 2015-12-20: qty 3

## 2015-12-20 MED ORDER — KETOROLAC TROMETHAMINE 30 MG/ML IJ SOLN
INTRAMUSCULAR | Status: DC | PRN
Start: 2015-12-20 — End: 2015-12-20
  Administered 2015-12-20: 30 mg

## 2015-12-20 MED ORDER — SODIUM CHLORIDE 0.9 % IJ SOLN
INTRAMUSCULAR | Status: AC
  Filled 2015-12-20: qty 50

## 2015-12-20 MED ORDER — SODIUM CHLORIDE 0.9 % IJ SOLN
INTRAMUSCULAR | Status: DC | PRN
  Administered 2015-12-20: 30 mL

## 2015-12-20 MED ORDER — LACTATED RINGERS IV SOLN
INTRAVENOUS | Status: DC
  Administered 2015-12-20: 1000 mL via INTRAVENOUS

## 2015-12-20 MED ORDER — BUPIVACAINE-EPINEPHRINE (PF) 0.25% -1:200000 IJ SOLN
INTRAMUSCULAR | Status: DC | PRN
  Administered 2015-12-20: 30 mL

## 2015-12-20 MED ORDER — ONDANSETRON HCL 4 MG PO TABS: 4.0000 mg | ORAL_TABLET | Freq: Four times a day (QID) | ORAL | Status: DC | PRN

## 2015-12-20 MED ORDER — OXYCODONE HCL 5 MG PO TABS: 5.0000 mg | ORAL_TABLET | Freq: Once | ORAL | Status: DC | PRN

## 2015-12-20 MED ORDER — FERROUS SULFATE 325 (65 FE) MG PO TABS
325.0000 mg | ORAL_TABLET | Freq: Three times a day (TID) | ORAL | Status: DC
  Administered 2015-12-20 – 2015-12-21 (×2): 325 mg via ORAL
  Filled 2015-12-20 (×2): qty 1

## 2015-12-20 MED ORDER — MENTHOL 3 MG MT LOZG: 1.0000 | LOZENGE | OROMUCOSAL | Status: DC | PRN

## 2015-12-20 MED ORDER — SODIUM CHLORIDE 0.9 % IR SOLN
Status: DC | PRN
  Administered 2015-12-20: 1000 mL

## 2015-12-20 MED ORDER — PROPOFOL 10 MG/ML IV BOLUS
INTRAVENOUS | Status: AC
  Filled 2015-12-20: qty 40

## 2015-12-20 MED ORDER — ONDANSETRON HCL 4 MG/2ML IJ SOLN
INTRAMUSCULAR | Status: AC
  Filled 2015-12-20: qty 2

## 2015-12-20 MED ORDER — PHENOL 1.4 % MT LIQD: 1.0000 | OROMUCOSAL | Status: DC | PRN

## 2015-12-20 MED ORDER — LACTATED RINGERS IV SOLN
INTRAVENOUS | Status: DC
Start: 2015-12-20 — End: 2015-12-21
  Administered 2015-12-20 (×3): via INTRAVENOUS

## 2015-12-20 MED ORDER — FENTANYL CITRATE (PF) 100 MCG/2ML IJ SOLN
INTRAMUSCULAR | Status: DC | PRN
  Administered 2015-12-20 (×2): 50 ug via INTRAVENOUS

## 2015-12-20 MED ORDER — DABIGATRAN ETEXILATE MESYLATE 75 MG PO CAPS
75.0000 mg | ORAL_CAPSULE | Freq: Two times a day (BID) | ORAL | Status: DC
  Administered 2015-12-21: 75 mg via ORAL
  Filled 2015-12-20 (×2): qty 1

## 2015-12-20 MED ORDER — DIPHENHYDRAMINE HCL 25 MG PO CAPS: 25.0000 mg | ORAL_CAPSULE | Freq: Four times a day (QID) | ORAL | Status: DC | PRN

## 2015-12-20 MED ORDER — DEXAMETHASONE SODIUM PHOSPHATE 10 MG/ML IJ SOLN
10.0000 mg | Freq: Once | INTRAMUSCULAR | Status: DC
Start: 2015-12-21 — End: 2015-12-21

## 2015-12-20 MED ORDER — BISACODYL 10 MG RE SUPP: 10.0000 mg | Freq: Every day | RECTAL | Status: DC | PRN

## 2015-12-20 MED ORDER — DABIGATRAN ETEXILATE MESYLATE 150 MG PO CAPS
150.0000 mg | ORAL_CAPSULE | Freq: Two times a day (BID) | ORAL | Status: DC
  Filled 2015-12-20: qty 1

## 2015-12-20 MED ORDER — BUPIVACAINE-EPINEPHRINE 0.25% -1:200000 IJ SOLN
INTRAMUSCULAR | Status: AC
  Filled 2015-12-20: qty 1

## 2015-12-20 MED ORDER — HYDROMORPHONE HCL 1 MG/ML IJ SOLN
0.2500 mg | INTRAMUSCULAR | Status: DC | PRN
  Administered 2015-12-20 (×2): 0.5 mg via INTRAVENOUS

## 2015-12-20 MED ORDER — ROSUVASTATIN CALCIUM 20 MG PO TABS
40.0000 mg | ORAL_TABLET | Freq: Every day | ORAL | Status: DC
  Administered 2015-12-20 – 2015-12-21 (×2): 40 mg via ORAL
  Filled 2015-12-20 (×2): qty 2

## 2015-12-20 MED ORDER — DEXAMETHASONE SODIUM PHOSPHATE 10 MG/ML IJ SOLN
INTRAMUSCULAR | Status: AC
  Filled 2015-12-20: qty 1

## 2015-12-20 MED ORDER — VANCOMYCIN HCL IN DEXTROSE 1-5 GM/200ML-% IV SOLN
1000.0000 mg | Freq: Two times a day (BID) | INTRAVENOUS | Status: AC
  Administered 2015-12-20: 1000 mg via INTRAVENOUS
  Filled 2015-12-20 (×2): qty 200

## 2015-12-20 MED ORDER — VANCOMYCIN HCL IN DEXTROSE 1-5 GM/200ML-% IV SOLN
1000.0000 mg | INTRAVENOUS | Status: AC
  Administered 2015-12-20: 1000 mg via INTRAVENOUS
  Filled 2015-12-20: qty 200

## 2015-12-20 SURGICAL SUPPLY — 46 items
BAG DECANTER FOR FLEXI CONT (MISCELLANEOUS) IMPLANT
BAG ZIPLOCK 12X15 (MISCELLANEOUS) IMPLANT
BANDAGE ACE 6X5 VEL STRL LF (GAUZE/BANDAGES/DRESSINGS) IMPLANT
BANDAGE ELASTIC 6 VELCRO ST LF (GAUZE/BANDAGES/DRESSINGS) ×3 IMPLANT
BLADE SAW SGTL 13.0X1.19X90.0M (BLADE) ×3 IMPLANT
BONE CEMENT GENTAMICIN (Cement) ×6 IMPLANT
BOWL SMART MIX CTS (DISPOSABLE) ×3 IMPLANT
CAPT KNEE TOTAL 3 ATTUNE ×3 IMPLANT
CEMENT BONE GENTAMICIN 40 (Cement) ×2 IMPLANT
CLOTH BEACON ORANGE TIMEOUT ST (SAFETY) ×3 IMPLANT
CUFF TOURN SGL QUICK 34 (TOURNIQUET CUFF) ×2
CUFF TRNQT CYL 34X4X40X1 (TOURNIQUET CUFF) ×1 IMPLANT
DECANTER SPIKE VIAL GLASS SM (MISCELLANEOUS) ×3 IMPLANT
DRAPE U-SHAPE 47X51 STRL (DRAPES) ×3 IMPLANT
DRESSING AQUACEL AG SP 3.5X10 (GAUZE/BANDAGES/DRESSINGS) ×1 IMPLANT
DRSG AQUACEL AG SP 3.5X10 (GAUZE/BANDAGES/DRESSINGS) ×3
DURAPREP 26ML APPLICATOR (WOUND CARE) ×6 IMPLANT
ELECT REM PT RETURN 9FT ADLT (ELECTROSURGICAL) ×3
ELECTRODE REM PT RTRN 9FT ADLT (ELECTROSURGICAL) ×1 IMPLANT
GLOVE BIOGEL M 7.0 STRL (GLOVE) IMPLANT
GLOVE BIOGEL PI IND STRL 7.5 (GLOVE) ×1 IMPLANT
GLOVE BIOGEL PI IND STRL 8.5 (GLOVE) ×1 IMPLANT
GLOVE BIOGEL PI INDICATOR 7.5 (GLOVE) ×2
GLOVE BIOGEL PI INDICATOR 8.5 (GLOVE) ×2
GLOVE ECLIPSE 8.0 STRL XLNG CF (GLOVE) ×3 IMPLANT
GLOVE ORTHO TXT STRL SZ7.5 (GLOVE) ×6 IMPLANT
GOWN STRL REUS W/TWL LRG LVL3 (GOWN DISPOSABLE) ×3 IMPLANT
GOWN STRL REUS W/TWL XL LVL3 (GOWN DISPOSABLE) ×3 IMPLANT
HANDPIECE INTERPULSE COAX TIP (DISPOSABLE) ×2
LIQUID BAND (GAUZE/BANDAGES/DRESSINGS) ×3 IMPLANT
MANIFOLD NEPTUNE II (INSTRUMENTS) ×3 IMPLANT
PACK TOTAL KNEE CUSTOM (KITS) ×3 IMPLANT
POSITIONER SURGICAL ARM (MISCELLANEOUS) ×3 IMPLANT
SET HNDPC FAN SPRY TIP SCT (DISPOSABLE) ×1 IMPLANT
SET PAD KNEE POSITIONER (MISCELLANEOUS) ×3 IMPLANT
SUT MNCRL AB 4-0 PS2 18 (SUTURE) ×3 IMPLANT
SUT VIC AB 1 CT1 36 (SUTURE) ×3 IMPLANT
SUT VIC AB 2-0 CT1 27 (SUTURE) ×6
SUT VIC AB 2-0 CT1 TAPERPNT 27 (SUTURE) ×3 IMPLANT
SUT VLOC 180 0 24IN GS25 (SUTURE) ×3 IMPLANT
SYR 50ML LL SCALE MARK (SYRINGE) ×3 IMPLANT
TRAY FOLEY W/METER SILVER 14FR (SET/KITS/TRAYS/PACK) ×3 IMPLANT
TRAY FOLEY W/METER SILVER 16FR (SET/KITS/TRAYS/PACK) IMPLANT
WATER STERILE IRR 1500ML POUR (IV SOLUTION) ×3 IMPLANT
WRAP KNEE MAXI GEL POST OP (GAUZE/BANDAGES/DRESSINGS) ×3 IMPLANT
YANKAUER SUCT BULB TIP 10FT TU (MISCELLANEOUS) ×3 IMPLANT

## 2015-12-20 NOTE — Anesthesia Procedure Notes (Addendum)
Spinal Patient location during procedure: OR Start time: 12/20/2015 12:42 PM End time: 12/20/2015 12:49 PM Reason for block: at surgeon's request Staffing Resident/CRNA: Christell Faith L Performed by: resident/CRNA  Preanesthetic Checklist Completed: patient identified, site marked, surgical consent, pre-op evaluation, timeout performed, IV checked, risks and benefits discussed, monitors and equipment checked and at surgeon's request Spinal Block Patient position: sitting Prep: ChloraPrep Patient monitoring: heart rate, continuous pulse ox and blood pressure Approach: midline Location: L4-5 Injection technique: single-shot Needle Needle type: Sprotte  Needle gauge: 25 G Needle length: 10 cm Assessment Sensory level: T8 Additional Notes Expiration of kit checked and confirmed. Patient tolerated procedure well,without complications x 1 attempt with noted clear CSF. Loss of motor and sensory on exam post injection.

## 2015-12-20 NOTE — Op Note (Signed)
NAME:  [name]                      MEDICAL RECORD NO.:  [id number]                             FACILITY:  [facility]      PHYSICIAN:  Pietro Cassis. Alvan Dame, M.D.  DATE OF BIRTH:  03-24-1946      DATE OF PROCEDURE:  12/20/2015                                     OPERATIVE REPORT         PREOPERATIVE DIAGNOSIS:  Left knee osteoarthritis.      POSTOPERATIVE DIAGNOSIS:  Left knee osteoarthritis. History of right total knee replacement     FINDINGS:  The patient was noted to have complete loss of cartilage and   bone-on-bone arthritis with associated osteophytes in the medial and patellofemoral compartments of   the knee with a significant synovitis and associated effusion.      PROCEDURE:  Left total knee replacement.      COMPONENTS USED:  DePuy Attune rotating platform posterior stabilized knee   system, a size 5 femur, 4 tibia, size 6 mm PS AOX insert, and 35 anatomic patellar   button.      SURGEON:  Pietro Cassis. Alvan Dame, M.D.      ASSISTANT:  Danae Orleans, PA-C.      ANESTHESIA:  Spinal.      SPECIMENS:  None.      COMPLICATION:  None.      DRAINS:  None.  EBL: <50cc      TOURNIQUET TIME:   Total Tourniquet Time Documented: Thigh (Left) - 25 minutes Total: Thigh (Left) - 25 minutes  .      The patient was stable to the recovery room.      INDICATION FOR PROCEDURE:  [name] is a 70 y.o. female patient of   mine.  The patient had been seen, evaluated, and treated conservatively in the   office with medication, activity modification, and injections.  The patient had   radiographic changes of bone-on-bone arthritis with endplate sclerosis and osteophytes noted.      The patient failed conservative measures including medication, injections, and activity modification, and at this point was ready for more definitive measures.   Based on the radiographic changes and failed conservative measures, the patient   decided to proceed with total knee replacement.  Risks of  infection,   DVT, component failure, need for revision surgery, postop course, and   expectations were all   discussed and reviewed.  Consent was obtained for benefit of pain   relief.      PROCEDURE IN DETAIL:  The patient was brought to the operative theater.   Once adequate anesthesia, preoperative antibiotics, 2 gm of Ancef, 1 gm of Tranexamic Acid, and 10 mg of Decadron administered, the patient was positioned supine with the left thigh tourniquet placed.  The  left lower extremity was prepped and draped in sterile fashion.  A time-   out was performed identifying the patient, planned procedure, and   extremity.      The left lower extremity was placed in the [facility] leg holder.  The leg was   exsanguinated, tourniquet elevated to 250 mmHg.  A midline  incision was   made followed by median parapatellar arthrotomy.  Following initial   exposure, attention was first directed to the patella.  Precut   measurement was noted to be 22 mm.  I resected down to 14 mm and used a   35 anatomic patellar button to restore patellar height as well as cover the cut   surface.      The lug holes were drilled and a metal shim was placed to protect the   patella from retractors and saw blades.      At this point, attention was now directed to the femur.  The femoral   canal was opened with a drill, irrigated to try to prevent fat emboli.  An   intramedullary rod was passed at 3 degrees valgus, 9 mm of bone was   resected off the distal femur.  Following this resection, the tibia was   subluxated anteriorly.  Using the extramedullary guide, 2 mm of bone was resected off   the proximal medial tibia.  We confirmed the gap would be   stable medially and laterally with a size 5 mm insert as well as confirmed   the cut was perpendicular in the coronal plane, checking with an alignment rod.      Once this was done, I sized the femur to be a size 5 in the anterior-   posterior dimension, chose a narrow  component based on medial and   lateral dimension.  The size 5 rotation block was then pinned in   position anterior referenced using the C-clamp to set rotation.  The   anterior, posterior, and  chamfer cuts were made without difficulty nor   notching making certain that I was along the anterior cortex to help   with flexion gap stability.      The final box cut was made off the lateral aspect of distal femur.      At this point, the tibia was sized to be a size 4, the size 4 tray was   then pinned in position through the medial third of the tubercle,   drilled, and keel punched.  Trial reduction was now carried with a 5 femur,  4 tibia, a size 6 mm PS AOX insert, and the 35 anatomic patella botton.  The knee was brought to   extension, full extension with good flexion stability with the patella   tracking through the trochlea without application of pressure.  Given   all these findings I drilled the femoral lug holes and then the trial components removed.  Final components were   opened and cement was mixed.  The knee was irrigated with normal saline   solution and pulse lavage.  The synovial lining was   then injected with 30 cc of 0.25% Marcaine with epinephrine and 1 cc of Toradol plus 30 cc of NS for a    total of 61 cc.      The knee was irrigated.  Final implants were then cemented onto clean and   dried cut surfaces of bone with the knee brought to extension with a size 6 mm trial insert.      Once the cement had fully cured, the excess cement was removed   throughout the knee.  I confirmed I was satisfied with the range of   motion and stability, and the final size 6 mm PS AOX insert was chosen.  It was   placed into the knee.  The tourniquet had been let down at 25 minutes.  No significant   hemostasis required.  The   extensor mechanism was then reapproximated using #1 Vicryl and #0 V-lock sutureswith the knee   in flexion.  The   remaining wound was closed with 2-0  Vicryl and running 4-0 Monocryl.   The knee was cleaned, dried, dressed sterilely using Dermabond and   Aquacel dressing.  The patient was then   brought to recovery room in stable condition, tolerating the procedure   well.   Please note that Physician Assistant, Danae Orleans, PA-C, was present for the entirety of the case, and was utilized for pre-operative positioning, peri-operative retractor management, general facilitation of the procedure.  He was also utilized for primary wound closure at the end of the case.              Pietro Cassis Alvan Dame, M.D.    12/20/2015 1:58 PM

## 2015-12-20 NOTE — Anesthesia Postprocedure Evaluation (Signed)
Anesthesia Post Note  Patient: [name]  Procedure(s) Performed: Procedure(s) (LRB): LEFT TOTAL KNEE ARTHROPLASTY (Left)  Patient location during evaluation: PACU Anesthesia Type: Spinal Level of consciousness: oriented and awake and alert Pain management: pain level controlled Vital Signs Assessment: post-procedure vital signs reviewed and stable Respiratory status: spontaneous breathing, respiratory function stable and patient connected to nasal cannula oxygen Cardiovascular status: blood pressure returned to baseline and stable Postop Assessment: no headache and no backache Anesthetic complications: no    Last Vitals:  Filed Vitals:   12/20/15 1532 12/20/15 1622  BP: 154/64 145/99  Pulse: 63 70  Temp: 36.7 C 36.5 C  Resp: 12 13    Last Pain:  Filed Vitals:   12/20/15 1703  PainSc: [place]

## 2015-12-20 NOTE — Anesthesia Preprocedure Evaluation (Signed)
Anesthesia Evaluation  Patient identified by MRN, date of birth, ID band Patient awake    Reviewed: Allergy & Precautions, NPO status , Patient's Chart, lab work & pertinent test results  Airway Mallampati: II   Neck ROM: full    Dental   Pulmonary shortness of breath, former smoker,    breath sounds clear to auscultation       Cardiovascular hypertension, + angina + CAD and + Cardiac Stents  + dysrhythmias Atrial Fibrillation  Rhythm:regular Rate:Normal  S/p ablation.  Last dose of pradaxa was 12/15/15.   Neuro/Psych    GI/Hepatic hiatal hernia, GERD  ,  Endo/Other  diabetes, Type 2  Renal/GU      Musculoskeletal  (+) Arthritis ,   Abdominal   Peds  Hematology   Anesthesia Other Findings   Reproductive/Obstetrics                             Anesthesia Physical Anesthesia Plan  ASA: III  Anesthesia Plan: MAC and Spinal   Post-op Pain Management:    Induction: Intravenous  Airway Management Planned: Simple Face Mask  Additional Equipment:   Intra-op Plan:   Post-operative Plan:   Informed Consent: I have reviewed the patients History and Physical, chart, labs and discussed the procedure including the risks, benefits and alternatives for the proposed anesthesia with the patient or authorized representative who has indicated his/her understanding and acceptance.     Plan Discussed with: CRNA, Anesthesiologist and Surgeon  Anesthesia Plan Comments:         Anesthesia Quick Evaluation

## 2015-12-20 NOTE — Transfer of Care (Signed)
Immediate Anesthesia Transfer of Care Note  Patient: [name]  Procedure(s) Performed: Procedure(s): LEFT TOTAL KNEE ARTHROPLASTY (Left)  Patient Location: PACU  Anesthesia Type:Spinal  Level of Consciousness: awake, alert  and oriented  Airway & Oxygen Therapy: Patient Spontanous Breathing and Patient connected to face mask oxygen  Post-op Assessment: Report given to RN and Post -op Vital signs reviewed and stable  Post vital signs: Reviewed and stable  Last Vitals:  Filed Vitals:   12/20/15 0940  BP: 160/79  Pulse: 71  Temp: 37 C  Resp: 16    Last Pain: There were no vitals filed for this visit.    Patients Stated Pain Goal: 4 ([id number])  Complications: No apparent anesthesia complications

## 2015-12-20 NOTE — Interval H&P Note (Signed)
History and Physical Interval Note:  12/20/2015 11:35 AM  [name]  has presented today for surgery, with the diagnosis of LEFT KNEE OA  The various methods of treatment have been discussed with the patient and family. After consideration of risks, benefits and other options for treatment, the patient has consented to  Procedure(s): LEFT TOTAL KNEE ARTHROPLASTY (Left) as a surgical intervention .  The patient's history has been reviewed, patient examined, no change in status, stable for surgery.  I have reviewed the patient's chart and labs.  Questions were answered to the patient's satisfaction.     Mauri Pole

## 2015-12-20 NOTE — Progress Notes (Signed)
Discharge instructions discussed with patient, plans to go to  [facility] for the night, bus pass given to patient

## 2015-12-21 ENCOUNTER — Encounter (HOSPITAL_COMMUNITY): Payer: Self-pay | Admitting: Orthopedic Surgery

## 2015-12-21 LAB — BASIC METABOLIC PANEL
ANION GAP: 7 (ref 5–15)
BUN: 20 mg/dL (ref 6–20)
CHLORIDE: 105 mmol/L (ref 101–111)
CO2: 23 mmol/L (ref 22–32)
Calcium: 8.2 mg/dL — ABNORMAL LOW (ref 8.9–10.3)
Creatinine, Ser: 0.92 mg/dL (ref 0.44–1.00)
Glucose, Bld: 198 mg/dL — ABNORMAL HIGH (ref 65–99)
POTASSIUM: 4.5 mmol/L (ref 3.5–5.1)
SODIUM: 135 mmol/L (ref 135–145)

## 2015-12-21 LAB — CBC
HCT: 29.2 % — ABNORMAL LOW (ref 36.0–46.0)
Hemoglobin: 10 g/dL — ABNORMAL LOW (ref 12.0–15.0)
MCH: 31.3 pg (ref 26.0–34.0)
MCHC: 34.2 g/dL (ref 30.0–36.0)
MCV: 91.3 fL (ref 78.0–100.0)
Platelets: 123 10*3/uL — ABNORMAL LOW (ref 150–400)
RBC: 3.2 MIL/uL — AB (ref 3.87–5.11)
RDW: 12.7 % (ref 11.5–15.5)
WBC: 10 10*3/uL (ref 4.0–10.5)

## 2015-12-21 MED ORDER — FERROUS SULFATE 325 (65 FE) MG PO TABS: 325.0000 mg | ORAL_TABLET | Freq: Three times a day (TID) | ORAL | Status: DC

## 2015-12-21 MED ORDER — POLYETHYLENE GLYCOL 3350 17 G PO PACK: 17.0000 g | PACK | Freq: Two times a day (BID) | ORAL | Status: DC

## 2015-12-21 MED ORDER — DOCUSATE SODIUM 100 MG PO CAPS: 100.0000 mg | ORAL_CAPSULE | Freq: Two times a day (BID) | ORAL | Status: DC

## 2015-12-21 MED ORDER — TIZANIDINE HCL 4 MG PO TABS: 4.0000 mg | ORAL_TABLET | Freq: Four times a day (QID) | ORAL | Status: DC | PRN

## 2015-12-21 MED ORDER — HYDROCODONE-ACETAMINOPHEN 7.5-325 MG PO TABS: 1.0000 | ORAL_TABLET | ORAL | Status: DC | PRN

## 2015-12-21 NOTE — Evaluation (Signed)
Physical Therapy One Time Evaluation Patient Details Name: [name] MRN: [id number] DOB: 1946-03-19 Today's Date: 12/21/2015   History of Present Illness  Pt is a 70 year old female s/p L TKA with hx of R TKA  Clinical Impression  Patient evaluated by Physical Therapy with no further acute PT needs identified. All education has been completed and the patient has no further questions.   Pt ambulating well and practiced LE exercises.  Pt able to recall correct stair technique from previous TKA and states she will have family assist her (declined practicing at this time). See below for any follow-up Physical Therapy or equipment needs. PT is signing off. Thank you for this referral.     Follow Up Recommendations Home health PT    Equipment Recommendations  Rolling walker with 5" wheels    Recommendations for Other Services       Precautions / Restrictions Precautions Precautions: Knee Restrictions Weight Bearing Restrictions: No Other Position/Activity Restrictions: WBAT      Mobility  Bed Mobility Overal bed mobility: Needs Assistance Bed Mobility: Supine to Sit     Supine to sit: Supervision        Transfers Overall transfer level: Needs assistance Equipment used: Rolling walker (2 wheeled) Transfers: Sit to/from Stand Sit to Stand: Supervision         General transfer comment: verbal cues for UE and LE positioning  Ambulation/Gait Ambulation/Gait assistance: Min guard Ambulation Distance (Feet): 160 Feet Assistive device: Rolling walker (2 wheeled) Gait Pattern/deviations: Step-through pattern;Antalgic;Decreased stance time - left     General Gait Details: verbal cues for sequence, RW positioning, posture  Stairs Stairs:  (declined practicing, able to correctly state sequence, will have family assist)          Wheelchair Mobility    Modified Rankin (Stroke Patients Only)       Balance                                              Pertinent Vitals/Pain Pain Assessment: 0-10 Pain Score: 2  Pain Location: L knee Pain Descriptors / Indicators: Aching;Sore Pain Intervention(s): Limited activity within patient's tolerance;Monitored during session;Premedicated before session;Repositioned;Ice applied    Home Living Family/patient expects to be discharged to:: Private residence Living Arrangements: Spouse/significant other Available Help at Discharge: Family Type of Home: House Home Access: Stairs to enter Entrance Stairs-Rails: Can reach both Entrance Stairs-Number of Steps: 4 Home Layout: One level Home Equipment: Bedside commode      Prior Function Level of Independence: Independent               Hand Dominance        Extremity/Trunk Assessment               Lower Extremity Assessment: LLE deficits/detail   LLE Deficits / Details: able to perform SLR, L AAROM knee flexion 40*     Communication   Communication: No difficulties  Cognition Arousal/Alertness: Awake/alert Behavior During Therapy: WFL for tasks assessed/performed Overall Cognitive Status: Within Functional Limits for tasks assessed                      General Comments      Exercises Total Joint Exercises Ankle Circles/Pumps: AROM;Both;10 reps Quad Sets: AROM;Both;10 reps Short Arc Quad: AROM;Left;10 reps Heel Slides: AAROM;Left;10 reps Hip ABduction/ADduction: 10 reps;Left;AROM Straight Leg  Raises: AROM;Left;10 reps      Assessment/Plan    PT Assessment All further PT needs can be met in the next venue of care  PT Diagnosis Difficulty walking;Acute pain   PT Problem List Decreased strength;Decreased range of motion;Decreased mobility;Pain  PT Treatment Interventions     PT Goals (Current goals can be found in the Care Plan section) Acute Rehab PT Goals PT Goal Formulation: All assessment and education complete, DC therapy    Frequency     Barriers to discharge        Co-evaluation                End of Session Equipment Utilized During Treatment: Gait belt Activity Tolerance: Patient tolerated treatment well Patient left: in chair;with call bell/phone within reach Nurse Communication: Mobility status         Time: 9381-8299 PT Time Calculation (min) (ACUTE ONLY): 21 min   Charges:   PT Evaluation $PT Eval Low Complexity: 1 Procedure     PT G Codes:        [name],[name] 12/21/2015, 12:12 PM [name], PT, DPT 12/21/2015 Pager: [phone]

## 2015-12-21 NOTE — Progress Notes (Signed)
     Subjective: 1 Day Post-Op Procedure(s) (LRB): LEFT TOTAL KNEE ARTHROPLASTY (Left)   Patient reports pain as mild, pain controlled. No events throughout the night. Looking forward to working therapy and getting better.  Ready to be discharged home.  Objective:   VITALS:   Filed Vitals:   12/21/15 0139 12/21/15 0501  BP: 126/64   Pulse: 91 78  Temp: 97.6 F (36.4 C) 97.6 F (36.4 C)  Resp: 12 16    Dorsiflexion/Plantar flexion intact Incision: dressing [name] No cellulitis present Compartment soft  LABS  Recent Labs  12/21/15 0409  HGB 10.0*  HCT 29.2*  WBC 10.0  PLT 123*     Recent Labs  12/21/15 0409  NA 135  K 4.5  BUN 20  CREATININE 0.92  GLUCOSE 198*     Assessment/Plan: 1 Day Post-Op Procedure(s) (LRB): LEFT TOTAL KNEE ARTHROPLASTY (Left) Foley cath [name] Advance diet Up with therapy [name] IV fluids Discharge home with home health  Follow up in 2 weeks at [facility]. Follow up with [name],[name] in 2 weeks.  Contact information:  [facility] [address], [place] [id number]    Obese (BMI 30-39.9) Estimated body mass index is 34.97 kg/(m^2) as calculated from the following:   Height as of this encounter: 5\' 2"  (1.575 m).   Weight as of this encounter: 86.75 kg (191 lb 4 oz). Patient also counseled that weight may inhibit the healing process Patient counseled that losing weight will help with future health issues       [place] Pugh. [name]   PAC  12/21/2015, 8:44 AM

## 2015-12-21 NOTE — Progress Notes (Signed)
OT Cancellation Note  Patient Details Name: LILYAN KOLACZ MRN: [id number] DOB: 1945-12-07   Cancelled Treatment:    Reason Eval/Treat Not Completed: OT screened, no needs identified, will sign off.  Pt had other knee done and doesn't have any OT needs. Will sign off.   Christyanna Mckeon 12/21/2015, 10:38 AM  Lesle Chris, OTR/L [phone] 12/21/2015

## 2015-12-21 NOTE — Discharge Instructions (Signed)

## 2015-12-21 NOTE — Care Management Note (Signed)
Case Management Note  Patient Details  Name: [name] MRN: [id number] Date of Birth: 1946/02/26  Subjective/Objective:                  LEFT TOTAL KNEE ARTHROPLASTY (Left)  Action/Plan: Discharge planning Expected Discharge Date:  12/21/15              Expected Discharge Plan:  [place]  In-House Referral:     Discharge planning Services  CM Consult  Post Acute Care Choice:    Choice offered to:  Patient  DME Arranged:  Walker rolling DME Agency:  [place]:  PT [place] Agency:  [facility] (now [facility])  Status of Service:  Completed, signed off  If discussed at [employer] of Stay Meetings, dates discussed:    Additional Comments: CM met with pt in room to offer choice of home health agency.  Pt chooses [facility] to render HHPT.  Referral given to [employer], Tim.  Cm called [facility] DME rep, Germaine to please deliver the rolling walker so pt can discharge.  No other CM needs were communicated. Dellie Catholic, RN 12/21/2015, 12:48 PM

## 2015-12-22 NOTE — Discharge Summary (Signed)
Physician Discharge Summary  Patient ID: [name] MRN: [id number] DOB/AGE: 72-Feb-1947 72 y.o.  Admit date: 12/20/2015 Discharge date: 12/21/2015   Procedures:  Procedure(s) (LRB): LEFT TOTAL KNEE ARTHROPLASTY (Left)  Attending Physician:  Dr. Paralee Cancel   Admission Diagnoses:   Left knee primary OA / pain  Discharge Diagnoses:  Principal Problem:   S/P left TKA Active Problems:   S/P knee replacement   Obese  Past Medical History  Diagnosis Date  . Coronary artery disease     a.  s/p Xience DES to RCA 04/2009;   b. TEE 2/12: EF 40%, Large PFO;  c.  Lexiscan Myoview 05/2012: EF 69%, no ischemia. LHC (05/2012):  Ostial diagonal 30-40%, proximal mid ramus intermedius 40-50%, RCA stent patent with 40-50% after stent, then 40%, distal RCA 40-50%, EF 55-65%. Medical therapy continued.;  d.  Carlton Adam Myoview (06/2013):  No ischemia, EF 83%, normal study  . History of recurrent UTIs   . S/P drug eluting coronary stent placement   . History of basal cell carcinoma excision     scalp  . PAF (paroxysmal atrial fibrillation) ([place])   . S/P ablation of atrial fibrillation   . Chronic diarrhea   . Fecal incontinence   . Borderline diabetes   . Hypertension   . History of kidney stones 51 years ago    x1  . GERD (gastroesophageal reflux disease)     occasional  . Arthritis     osteoarthritis  . Tremor   . A-fib ([place])   . Dysrhythmia   . Wears glasses   . History of hiatal hernia     HPI:    [name], 70 y.o. female, has a history of pain and functional disability in the left knee due to arthritis and has failed non-surgical conservative treatments for greater than 12 weeks to include NSAID's and/or analgesics, corticosteriod injections, viscosupplementation injections and activity modification. Onset of symptoms was gradual, starting >10 years ago with gradually worsening course since that time. The patient noted prior procedures on the knee to include arthroscopy  on the left knee(s). Patient currently rates pain in the left knee(s) at 8 out of 10 with activity. Patient has night pain, worsening of pain with activity and weight bearing, pain that interferes with activities of daily living, pain with passive range of motion, crepitus and joint swelling. Patient has evidence of periarticular osteophytes and joint space narrowing by imaging studies. There is no active infection. Risks, benefits and expectations were discussed with the patient. Risks including but not limited to the risk of anesthesia, blood clots, nerve damage, blood vessel damage, failure of the prosthesis, infection and up to and including death. Patient understand the risks, benefits and expectations and wishes to proceed with surgery.   PCP: Renee Connors, PA-C   Discharged Condition: good  Hospital Course:  Patient underwent the above stated procedure on 12/20/2015. Patient tolerated the procedure well and brought to the recovery room in good condition and subsequently to the floor.  POD #1 BP: 126/64 ; Pulse: 78 ; Temp: 97.6 F (36.4 C) ; Resp: 16 Patient reports pain as mild, pain controlled. No events throughout the night. Looking forward to working therapy and getting better. Ready to be discharged home. Dorsiflexion/plantar flexion intact, incision: dressing C/D/I, no cellulitis present and compartment soft.   LABS  Basename    HGB     10.0  HCT     29.2    Discharge Exam: General appearance: alert,  cooperative and no distress Extremities: Homans sign is negative, no sign of DVT, no edema, redness or tenderness in the calves or thighs and no ulcers, gangrene or trophic changes  Disposition: Home with follow up in 2 weeks   Follow-up Information    Follow up with Mauri Pole, MD. Schedule an appointment as soon as possible for a visit in 2 weeks.   Specialty:  Orthopedic Surgery   Contact information:   [address] [place]  [address] [phone]       Follow up with [facility].   Why:  home health physical therapy   Contact information:   [place] [address] [phone]       Discharge Instructions    Call MD / Call 911    Complete by:  As directed   If you experience chest pain or shortness of breath, CALL 911 and be transported to the hospital emergency room.  If you develope a fever above 101 F, pus (white drainage) or increased drainage or redness at the wound, or calf pain, call your surgeon's office.     Change dressing    Complete by:  As directed   Maintain surgical dressing until follow up in the clinic. If the edges start to pull up, may reinforce with tape. If the dressing is no longer working, may remove and cover with gauze and tape, but must keep the area dry and clean.  Call with any questions or concerns.     Constipation Prevention    Complete by:  As directed   Drink plenty of fluids.  Prune juice may be helpful.  You may use a stool softener, such as Colace (over the counter) 100 mg twice a day.  Use MiraLax (over the counter) for constipation as needed.     Diet - low sodium heart healthy    Complete by:  As directed      Discharge instructions    Complete by:  As directed   Maintain surgical dressing until follow up in the clinic. If the edges start to pull up, may reinforce with tape. If the dressing is no longer working, may remove and cover with gauze and tape, but must keep the area dry and clean.  Follow up in 2 weeks at [facility]. Call with any questions or concerns.     Increase activity slowly as tolerated    Complete by:  As directed   Weight bearing as tolerated with assist device (walker, cane, etc) as directed, use it as long as suggested by your surgeon or therapist, typically at least 4-6 weeks.     TED hose    Complete by:  As directed   Use stockings (TED hose) for 2 weeks on both leg(s).  You may remove them at night  for sleeping.             Medication List    STOP taking these medications        meloxicam 15 MG tablet  Commonly known as:  MOBIC     TraMADol HCl 200 MG Cp24      TAKE these medications        dabigatran 150 MG Caps capsule  Commonly known as:  PRADAXA  Take 1 capsule (150 mg total) by mouth every 12 (twelve) hours.     diphenhydrAMINE 25 mg capsule  Commonly known as:  BENADRYL  Take 25 mg by mouth at bedtime as needed  for sleep.     docusate sodium 100 MG capsule  Commonly known as:  COLACE  Take 1 capsule (100 mg total) by mouth 2 (two) times daily.     ferrous sulfate 325 (65 FE) MG tablet  Take 1 tablet (325 mg total) by mouth 3 (three) times daily after meals.     HYDROcodone-acetaminophen 7.5-325 MG tablet  Commonly known as:  NORCO  Take 1-2 tablets by mouth every 4 (four) hours as needed for moderate pain.     lisinopril 10 MG tablet  Commonly known as:  PRINIVIL,ZESTRIL  Take 1 tablet (10 mg total) by mouth daily.     nitroGLYCERIN 0.4 MG SL tablet  Commonly known as:  NITROSTAT  Place 0.4 mg under the tongue every 5 (five) minutes as needed for chest pain.     OVER THE COUNTER MEDICATION  Take 1 tablet by mouth daily. Vitamin A 1500 FU     pantoprazole 40 MG tablet  Commonly known as:  PROTONIX  Take 40 mg by mouth daily.     polyethylene glycol packet  Commonly known as:  MIRALAX / GLYCOLAX  Take 17 g by mouth 2 (two) times daily.     primidone 50 MG tablet  Commonly known as:  MYSOLINE  Take 2 tablets (100 mg total) by mouth at bedtime.     rosuvastatin 40 MG tablet  Commonly known as:  CRESTOR  Take 1 tablet (40 mg total) by mouth daily.     tiZANidine 4 MG tablet  Commonly known as:  ZANAFLEX  Take 1 tablet (4 mg total) by mouth every 6 (six) hours as needed for muscle spasms.     traZODone 50 MG tablet  Commonly known as:  DESYREL  Take 150 mg by mouth at bedtime.         Signed: West Pugh. Jase Reep   PA-C  12/22/2015,  3:09 PM

## 2016-02-07 ENCOUNTER — Encounter: Payer: Self-pay | Admitting: Internal Medicine

## 2016-02-07 ENCOUNTER — Ambulatory Visit: Payer: Medicare Other | Admitting: Neurology

## 2016-02-09 ENCOUNTER — Other Ambulatory Visit: Payer: Self-pay | Admitting: Physician Assistant

## 2016-02-20 ENCOUNTER — Encounter: Payer: Self-pay | Admitting: Internal Medicine

## 2016-02-20 ENCOUNTER — Ambulatory Visit (INDEPENDENT_AMBULATORY_CARE_PROVIDER_SITE_OTHER): Payer: Medicare Other | Admitting: Internal Medicine

## 2016-02-20 ENCOUNTER — Other Ambulatory Visit: Payer: Self-pay

## 2016-02-20 VITALS — BP 136/72 | HR 62 | Ht 63.0 in | Wt 187.4 lb

## 2016-02-20 DIAGNOSIS — I480 Paroxysmal atrial fibrillation: Secondary | ICD-10-CM | POA: Diagnosis not present

## 2016-02-20 DIAGNOSIS — I10 Essential (primary) hypertension: Secondary | ICD-10-CM

## 2016-02-20 NOTE — Patient Instructions (Signed)

## 2016-02-20 NOTE — Progress Notes (Signed)
PCP:  Fransisca Connors, PA-C  The patient presents today for electrophysiology followup.  Since last being seen in our clinic, the patient reports doing very well.  She is recovering from recent knee surgery.  No symptomatic afib in years.  Today, she denies symptoms of palpitations, chest pain, SOB, orthopnea, PND, lower extremity edema, dizziness, presyncope, syncope, or neurologic sequela.   The patient feels that she is tolerating medications without difficulties and is otherwise without complaint today.   Past Medical History:  Diagnosis Date  . A-fib ([place])   . Arthritis    osteoarthritis  . Borderline diabetes   . Chronic diarrhea   . Coronary artery disease    a.  s/p Xience DES to RCA 04/2009;   b. TEE 2/12: EF 40%, Large PFO;  c.  Lexiscan Myoview 05/2012: EF 69%, no ischemia. LHC (05/2012):  Ostial diagonal 30-40%, proximal mid ramus intermedius 40-50%, RCA stent patent with 40-50% after stent, then 40%, distal RCA 40-50%, EF 55-65%. Medical therapy continued.;  d.  Carlton Adam Myoview (06/2013):  No ischemia, EF 83%, normal study  . Dysrhythmia   . Fecal incontinence   . GERD (gastroesophageal reflux disease)    occasional  . History of basal cell carcinoma excision    scalp  . History of hiatal hernia   . History of kidney stones 51 years ago   x1  . History of recurrent UTIs   . Hypertension   . PAF (paroxysmal atrial fibrillation) ([place])   . S/P ablation of atrial fibrillation   . S/P drug eluting coronary stent placement   . Tremor   . Wears glasses    Past Surgical History:  Procedure Laterality Date  . ANAL RECTAL MANOMETRY N/A 01/11/2014   Procedure: ANAL RECTAL MANOMETRY;  Surgeon: Leighton Ruff, MD;  Location: [facility];  Service: Endoscopy;  Laterality: N/A;  . APPENDECTOMY  1973  . BACK SURGERY     laminectomy times 2; 2000 and 2001  . BUNIONECTOMY Bilateral 2013  . CARDIAC CATHETERIZATION  10-24-2010   dr Ioannis Schuh   singl-vessel CAD with patent stent  mRCA/  moderate disease mRCA beyond stent segment/  normal lvsf  . CARDIAC ELECTROPHYSIOLOGY MAPPING AND ABLATION  08-15-2010  dr Tasmin Exantus  . CARDIOVASCULAR STRESS TEST  06-15-2013  dr Alverto Shedd   normal perfusion study/  no ischemia/  ef 83%  . CARPAL TUNNEL RELEASE Right 25 years ago  . CHOLECYSTECTOMY OPEN  1973   W/  APPENDECTOMY  . CORONARY ANGIOPLASTY WITH STENT PLACEMENT  04-04-2009  dr Darnell Level brodie   PCI and DES x1 to  mRCA/  mLAD 40%, pCX 30%, pRCA 30%/  normal LVF  . FLEXIBLE SIGMOIDOSCOPY N/A 07/07/2013   Procedure: FLEXIBLE SIGMOIDOSCOPY;  Surgeon: Winfield Cunas., MD;  Location: [facility];  Service: Endoscopy;  Laterality: N/A;  unprepped  . LEFT HEART CATHETERIZATION WITH CORONARY ANGIOGRAM N/A 05/09/2012   Procedure: LEFT HEART CATHETERIZATION WITH CORONARY ANGIOGRAM;  Surgeon: Hillary Bow, MD;  Location: [facility] CATH LAB;  Service: Cardiovascular;  Laterality: N/A;  . MOHS SURGERY  ~6 years ago   scalp  . PILONIDAL CYST EXCISION  2009;   2000;   1999  . RECTAL ULTRASOUND N/A 01/11/2014   Procedure: RECTAL ULTRASOUND;  Surgeon: Leighton Ruff, MD;  Location: [facility];  Service: Endoscopy;  Laterality: N/A;  . REFRACTIVE SURGERY Bilateral 16 years ago  . TOTAL KNEE ARTHROPLASTY Right 09/07/2014   Procedure: RIGHT TOTAL KNEE ARTHROPLASTY;  Surgeon: Paralee Cancel,  MD;  Location: [facility];  Service: Orthopedics;  Laterality: Right;  . TOTAL KNEE ARTHROPLASTY Left 12/20/2015   Procedure: LEFT TOTAL KNEE ARTHROPLASTY;  Surgeon: Paralee Cancel, MD;  Location: [facility];  Service: Orthopedics;  Laterality: Left;  . TRANSTHORACIC ECHOCARDIOGRAM  09-19-2011   grade I diastolic dysfunction/  ef 55-60%/  mild TR  . VAGINAL HYSTERECTOMY  1975   W/ BILATERAL SALPINOOPHOORECTOMY  . VAGUS NERVE STIMULATOR INSERTION N/A 03/10/2014   Procedure: IMPLANTATION OF SACRAL  NERVE STIMULATOR ;  Surgeon: Leighton Ruff, MD;  Location: [facility];  Service: General;  Laterality: N/A;  Sacral.  [employer]    Current Outpatient Prescriptions  Medication Sig Dispense Refill  . dabigatran (PRADAXA) 150 MG CAPS capsule Take 1 capsule (150 mg total) by mouth every 12 (twelve) hours. 180 capsule 3  . diphenhydrAMINE (BENADRYL) 25 mg capsule Take 25 mg by mouth at bedtime as needed for sleep.    [name] lisinopril (PRINIVIL,ZESTRIL) 10 MG tablet Take 1 tablet (10 mg total) by mouth daily. 90 tablet 3  . meloxicam (MOBIC) 15 MG tablet Take 1 tablet by mouth daily.    . nitroGLYCERIN (NITROSTAT) 0.4 MG SL tablet Place 0.4 mg under the tongue every 5 (five) minutes as needed for chest pain.    [name] OVER THE COUNTER MEDICATION Take 1 tablet by mouth daily. Vitamin A 1500 FU    . pantoprazole (PROTONIX) 40 MG tablet Take 40 mg by mouth daily.    . primidone (MYSOLINE) 50 MG tablet Take 2 tablets (100 mg total) by mouth at bedtime. 180 tablet 1  . rosuvastatin (CRESTOR) 40 MG tablet Take 1 tablet (40 mg total) by mouth daily. 90 tablet 0  . TraMADol HCl 200 MG CP24 Take 1 tablet by mouth daily as needed. Pain    . traZODone (DESYREL) 50 MG tablet Take 150 mg by mouth at bedtime.      No current facility-administered medications for this visit.     Allergies  Allergen Reactions  . Cephalexin Nausea Only    Social History   Social History  . Marital status: Married    Spouse name: N/A  . Number of children: 2  . Years of education: HS   Occupational History  . retired Retired   Social History Main Topics  . Smoking status: Former Smoker    Packs/day: 0.50    Years: 20.00    Types: Cigarettes    Quit date: 07/02/1988  . Smokeless tobacco: Never Used  . Alcohol use No  . Drug use: No  . Sexual activity: Not on file   Other Topics Concern  . Not on file   Social History Narrative  . No narrative on file    Family History  Problem Relation Age of Onset  . Coronary artery disease Father   . Heart disease Mother   . Heart disease Sister     Physical Exam: Vitals:   02/20/16  1529  BP: 136/72  Pulse: 62  Weight: 187 lb 6.4 oz (85 kg)  Height: 5\' 3"  (1.6 m)    GEN- The patient is well appearing, alert and oriented x 3 today.   Head- normocephalic, atraumatic Eyes-  Sclera clear, conjunctiva pink Ears- hearing intact Oropharynx- clear Neck- supple,   Lymph- no cervical lymphadenopathy Lungs- Clear to ausculation bilaterally, normal work of breathing Heart- Regular rate and rhythm, no murmurs, rubs or gallops, PMI not laterally displaced GI- soft, NT, ND, + BS Extremities- no clubbing, cyanosis,  or edema MS- no significant deformity or atrophy Skin- no rash or lesion Psych- euthymic mood, full affect Neuro- strength and sensation are intact  ekg today reveals sinus rhythm, septal infarct, otherwise normal ekg  Assessment and Plan: 1. afib Well controlled off AAD therapy Continue pradaxa for stroke prevention chads2vasc score is at least 5 Reports that PCP follows [facility] and cbc Repeat echo upon return  2. CAD No ischemic symptoms No changes  3. HL Stable No change required today  4. HTN Stable No change required today  Return to see me in 1 year  Thompson Grayer MD, [facility] 02/20/2016 4:08 PM

## 2016-02-21 ENCOUNTER — Encounter: Payer: Self-pay | Admitting: Neurology

## 2016-02-21 ENCOUNTER — Ambulatory Visit (INDEPENDENT_AMBULATORY_CARE_PROVIDER_SITE_OTHER): Payer: Medicare Other | Admitting: Neurology

## 2016-02-21 VITALS — BP 160/88 | HR 76 | Resp 18 | Ht 63.0 in | Wt 186.0 lb

## 2016-02-21 DIAGNOSIS — G250 Essential tremor: Secondary | ICD-10-CM | POA: Diagnosis not present

## 2016-02-21 DIAGNOSIS — Z96652 Presence of left artificial knee joint: Secondary | ICD-10-CM | POA: Diagnosis not present

## 2016-02-21 MED ORDER — PRIMIDONE 50 MG PO TABS: 100.0000 mg | ORAL_TABLET | Freq: Every day | ORAL | 3 refills | Status: DC

## 2016-02-21 NOTE — Patient Instructions (Signed)
  Please remember, that any kind of tremor may be exacerbated by anxiety, anger, nervousness, excitement, dehydration, sleep deprivation, by caffeine, and low blood sugar values or blood sugar fluctuations. Some medications, especially some antidepressants and lithium can cause or exacerbate tremors. Tremors may temporarily calm down her subside with the use of a benzodiazepine such as Valium or related medications and with alcohol. Be aware however that drinking alcohol is not an approved treatment or appropriate treatment for tremor control and long-term use of benzodiazepines such as Valium, lorazepam, alprazolam, or clonazepam can cause habit formation, physical and psychological addiction.  We will continue with the mysoline at 100 mg each night.

## 2016-02-21 NOTE — Progress Notes (Signed)
Subjective:    Patient ID: [name] is a 70 y.o. female.  HPI     Interim history:   [name] is a 70 year old right-handed woman with an underlying medical history of coronary artery disease, status post stent placement, paroxysmal A. fib, status post ablation, chronic diarrhea, fecal incontinence, borderline diabetes, hypertension, reflux disease, kidney stones, arthritis, status post right total knee arthroplasty in 3/16 and recent s/p L TKA in 6/17, recurrent UTIs, and obesity, who presents for follow-up consultation of her essential tremor. The patient is unaccompanied today. I first met her on 10/06/2015 at the request of her primary care provider, at which time she reported a long-standing history of head tremors, possibly since teenage years. Her history and physical exam were in keeping with essential tremor. I suggested a cautious trial of Mysoline with titration.  Today, 02/21/2016: She reports doing better, mysoline has helped, denies any SEs with it. In the interim, she had left total knee arthroplasty on 12/20/2015. She has improved with the primidone, currently 100 mg, still on some pain meds, tramadol ER 200 mg 1-2 per day. Still in outpatient PT and exercises at home. Tries to hydrate well with water.   Previously:  10/06/2015: She reports a long-standing history of head tremors and to a much lesser degree in her hands. She recalls that she had tremors when she was 1 or even 70 years old. She has a strong family history of tremors in both parents, younger sister who is much more affected by her tremor and both younger brothers, one had to retire early cousin of his tremor. Her tremor does not bother her very much but she notices it from time to time, especially when she is fatigued. 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 Sometimes with her earrings she needs help to put them in. Otherwise, she has no significant difficulty with feeding or dressing,  self hygiene or ADLs otherwise. She is married and lives with her husband. He has not mentioned any voice tremor to her. She does note her head tremor. She has 2 grown children. She drinks alcohol in the form of wine, usually once a month or so and has not noticed any improvement in her tremor or did not pay enough attention to it. She drinks coffee 1 cup a day, she quit smoking in 1990. She tries to stay active physically. She has not noticed much in the way of balance problems. She had left knee arthroscopic surgery in November 2016 and right knee replacement surgery in March 2016. She still has some knee discomfort and is currently in home health physical therapy.    Her Past Medical History Is Significant For: Past Medical History:  Diagnosis Date  . A-fib ([place])   . Arthritis    osteoarthritis  . Borderline diabetes   . Chronic diarrhea   . Coronary artery disease    a.  s/p Xience DES to RCA 04/2009;   b. TEE 2/12: EF 40%, Large PFO;  c.  Lexiscan Myoview 05/2012: EF 69%, no ischemia. LHC (05/2012):  Ostial diagonal 30-40%, proximal mid ramus intermedius 40-50%, RCA stent patent with 40-50% after stent, then 40%, distal RCA 40-50%, EF 55-65%. Medical therapy continued.;  d.  Carlton Adam Myoview (06/2013):  No ischemia, EF 83%, normal study  . Dysrhythmia   . Fecal incontinence   . GERD (gastroesophageal reflux disease)    occasional  . History of basal cell carcinoma excision    scalp  .  History of hiatal hernia   . History of kidney stones 51 years ago   x1  . History of recurrent UTIs   . Hypertension   . PAF (paroxysmal atrial fibrillation) ([place])   . S/P ablation of atrial fibrillation   . S/P drug eluting coronary stent placement   . Tremor   . Wears glasses     Her Past Surgical History Is Significant For: Past Surgical History:  Procedure Laterality Date  . ANAL RECTAL MANOMETRY N/A 01/11/2014   Procedure: ANAL RECTAL MANOMETRY;  Surgeon: Leighton Ruff, MD;  Location: [facility]  ENDOSCOPY;  Service: Endoscopy;  Laterality: N/A;  . APPENDECTOMY  1973  . BACK SURGERY     laminectomy times 2; 2000 and 2001  . BUNIONECTOMY Bilateral 2013  . CARDIAC CATHETERIZATION  10-24-2010   dr allred   singl-vessel CAD with patent stent mRCA/  moderate disease mRCA beyond stent segment/  normal lvsf  . CARDIAC ELECTROPHYSIOLOGY MAPPING AND ABLATION  08-15-2010  dr allred  . CARDIOVASCULAR STRESS TEST  06-15-2013  dr allred   normal perfusion study/  no ischemia/  ef 83%  . CARPAL TUNNEL RELEASE Right 25 years ago  . CHOLECYSTECTOMY OPEN  1973   W/  APPENDECTOMY  . CORONARY ANGIOPLASTY WITH STENT PLACEMENT  04-04-2009  dr Darnell Level brodie   PCI and DES x1 to  mRCA/  mLAD 40%, pCX 30%, pRCA 30%/  normal LVF  . FLEXIBLE SIGMOIDOSCOPY N/A 07/07/2013   Procedure: FLEXIBLE SIGMOIDOSCOPY;  Surgeon: Winfield Cunas., MD;  Location: [facility];  Service: Endoscopy;  Laterality: N/A;  unprepped  . LEFT HEART CATHETERIZATION WITH CORONARY ANGIOGRAM N/A 05/09/2012   Procedure: LEFT HEART CATHETERIZATION WITH CORONARY ANGIOGRAM;  Surgeon: Hillary Bow, MD;  Location: [facility] CATH LAB;  Service: Cardiovascular;  Laterality: N/A;  . MOHS SURGERY  ~6 years ago   scalp  . PILONIDAL CYST EXCISION  2009;   2000;   1999  . RECTAL ULTRASOUND N/A 01/11/2014   Procedure: RECTAL ULTRASOUND;  Surgeon: Leighton Ruff, MD;  Location: [facility] ENDOSCOPY;  Service: Endoscopy;  Laterality: N/A;  . REFRACTIVE SURGERY Bilateral 16 years ago  . TOTAL KNEE ARTHROPLASTY Right 09/07/2014   Procedure: RIGHT TOTAL KNEE ARTHROPLASTY;  Surgeon: Paralee Cancel, MD;  Location: [facility] ORS;  Service: Orthopedics;  Laterality: Right;  . TOTAL KNEE ARTHROPLASTY Left 12/20/2015   Procedure: LEFT TOTAL KNEE ARTHROPLASTY;  Surgeon: Paralee Cancel, MD;  Location: [facility] ORS;  Service: Orthopedics;  Laterality: Left;  . TRANSTHORACIC ECHOCARDIOGRAM  09-19-2011   grade I diastolic dysfunction/  ef 55-60%/  mild TR  . VAGINAL HYSTERECTOMY  1975   W/  BILATERAL SALPINOOPHOORECTOMY  . VAGUS NERVE STIMULATOR INSERTION N/A 03/10/2014   Procedure: IMPLANTATION OF SACRAL  NERVE STIMULATOR ;  Surgeon: Leighton Ruff, MD;  Location: [place];  Service: General;  Laterality: N/A;  Sacral. [employer]    Her Family History Is Significant For: Family History  Problem Relation Age of Onset  . Coronary artery disease Father   . Heart disease Mother   . Heart disease Sister     Her Social History Is Significant For: Social History   Social History  . Marital status: Married    Spouse name: N/A  . Number of children: 2  . Years of education: HS   Occupational History  . retired Retired   Social History Main Topics  . Smoking status: Former Smoker    Packs/day: 0.50    Years: 20.00  Types: Cigarettes    Quit date: 07/02/1988  . Smokeless tobacco: Never Used  . Alcohol use No  . Drug use: No  . Sexual activity: Not Asked   Other Topics Concern  . None   Social History Narrative  . None    Her Allergies Are:  Allergies  Allergen Reactions  . Cephalexin Nausea Only  :   Her Current Medications Are:  Outpatient Encounter Prescriptions as of 02/21/2016  Medication Sig  . dabigatran (PRADAXA) 150 MG CAPS capsule Take 1 capsule (150 mg total) by mouth every 12 (twelve) hours.  . diphenhydrAMINE (BENADRYL) 25 mg capsule Take 25 mg by mouth at bedtime as needed for sleep.  [name] lisinopril (PRINIVIL,ZESTRIL) 10 MG tablet Take 1 tablet (10 mg total) by mouth daily.  . nitroGLYCERIN (NITROSTAT) 0.4 MG SL tablet Place 0.4 mg under the tongue every 5 (five) minutes as needed for chest pain.  . pantoprazole (PROTONIX) 40 MG tablet Take 40 mg by mouth daily.  . primidone (MYSOLINE) 50 MG tablet Take 2 tablets (100 mg total) by mouth at bedtime.  . rosuvastatin (CRESTOR) 40 MG tablet Take 1 tablet (40 mg total) by mouth daily.  . TraMADol HCl 200 MG CP24 Take 1 tablet by mouth daily as needed. Pain  . traZODone (DESYREL) 50 MG  tablet Take 150 mg by mouth at bedtime.   . [DISCONTINUED] meloxicam (MOBIC) 15 MG tablet Take 1 tablet by mouth daily.  . [DISCONTINUED] OVER THE COUNTER MEDICATION Take 1 tablet by mouth daily. Vitamin A 1500 FU   No facility-administered encounter medications on file as of 02/21/2016.   :  Review of Systems:  Out of a complete 14 point review of systems, all are reviewed and negative with the exception of these symptoms as listed below:  Review of Systems  Neurological:       Patient states that the Primidone helps her tremors. No new concerns.     Objective:  Neurologic Exam  Physical Exam Physical Examination:   Vitals:   02/21/16 1418  BP: (!) 160/88  Pulse: 76  Resp: 18    General Examination: The patient is a very pleasant 70 y.o. female in no acute distress. She appears well-developed and well-nourished and well groomed.   HEENT: Normocephalic, atraumatic, pupils are equal, round and reactive to light and accommodation. Funduscopic exam is normal with sharp disc margins noted. Extraocular tracking is good without limitation to gaze excursion or nystagmus noted. Normal smooth pursuit is noted. Hearing is grossly intact. Face is symmetric with normal facial animation and normal facial sensation. Speech is clear with no dysarthria noted. There is no hypophonia. There is no lip, or voice tremor. She has a mild, intermittent head and neck tremor, side to side, no-no type, better. Neck is supple with full range of passive and active motion. There are no carotid bruits on auscultation. Oropharynx exam reveals: mild mouth dryness, adequate dental hygiene and mild airway crowding, due to smaller airway entry. Mallampati is class II. Tongue protrudes centrally and palate elevates symmetrically.   Chest: Clear to auscultation without wheezing, rhonchi or crackles noted.  Heart: S1+S2+0, regular and normal without murmurs, rubs or gallops noted.   Abdomen: Soft, non-tender and  non-distended with normal bowel sounds appreciated on auscultation.  Extremities: There is no pitting edema in the distal lower extremities bilaterally. Pedal pulses are intact.  Skin: Warm and dry without trophic changes noted. There are no varicose veins.  Musculoskeletal: exam reveals no obvious joint  deformities, tenderness or joint swelling or erythema, with the exception of arthritic changes in both hands, and mild discomfort in both knees, right knee range of motion is decreased as well, mild L knee swelling.   Neurologically:  Mental status: The patient is awake, alert and oriented in all 4 spheres. Her immediate and remote memory, attention, language skills and fund of knowledge are appropriate. There is no evidence of aphasia, agnosia, apraxia or anomia. Speech is clear with normal prosody and enunciation. Thought process is linear. Mood is normal and affect is normal.  Cranial nerves II - XII are as described above under HEENT exam. In addition: shoulder shrug is normal with equal shoulder height noted. Motor exam: Normal bulk, strength and tone is noted. There is no drift, resting tremor or rebound. She has no significant postural tremor but has a mild action tremor in both upper extremities, slightly better. (On 10/06/15: Handwriting is not tremulous and legible. She has no evidence of micrographia. On Archimedes spiral drawing she has mild course tremulousness bilaterally). Romberg is negative. Reflexes are 1-2+ throughout. fine motor skills and coordination: intact with normal finger taps, normal hand movements, normal rapid alternating patting, normal foot taps and normal foot agility.  Cerebellar testing: No dysmetria or intention tremor on finger to nose testing. Heel to shin is unremarkable bilaterally. There is no truncal or gait ataxia.  Sensory exam: intact to light touch, pinprick, vibration, temperature sense in the upper and lower extremities.  Gait, station and balance: She  stands easily. No veering to one side is noted. No leaning to one side is noted. Posture is age-appropriate and stance is narrow based. Gait shows normal stride length and normal pace. No problems turning are noted. Tandem walk is not tested today, d/t recent knee replacement.   Assessment and Plan:   In summary, [name] is a very pleasant 70 year old female with an underlying medical history of coronary artery disease, status post stent placement, paroxysmal A. fib, status post ablation, chronic diarrhea, fecal incontinence, borderline diabetes, hypertension, reflux disease, kidney stones, arthritis, status post right total knee arthroplasty in 3/16 and L TKA recently on 12/20/15, recurrent UTIs, and obesity, who presents for follow-up consultation of her essential tremor, affecting primarily her head and to a lesser degree both hands. Her exam has indeed improved slightly, she reports improvement with Mysoline which is currently at 100 mg at night generic. She is tolerating it well. I suggested we continue with the current dose and timing. She has a long-standing history of tremors since her teenage years even. She has a strong family history of tremors as well. I would like to see her back in 6 months, sooner as needed. I answered all her questions today and the patient was in agreement. I spent 25 minutes in total face-to-face time with the patient, more than 50% of which was spent in counseling and coordination of care, reviewing test results, reviewing medication and discussing or reviewing the diagnosis of ET, its prognosis and treatment options.

## 2016-03-05 ENCOUNTER — Other Ambulatory Visit: Payer: Self-pay | Admitting: Physician Assistant

## 2016-03-10 ENCOUNTER — Other Ambulatory Visit: Payer: Self-pay | Admitting: Physician Assistant

## 2016-03-12 ENCOUNTER — Other Ambulatory Visit: Payer: Self-pay | Admitting: *Deleted

## 2016-03-12 MED ORDER — METOPROLOL SUCCINATE ER 25 MG PO TB24
25.0000 mg | ORAL_TABLET | Freq: Two times a day (BID) | ORAL | 3 refills | Status: DC
Start: 2016-03-12 — End: 2017-03-07

## 2016-03-12 NOTE — Telephone Encounter (Signed)
Ok to fill how patient is taking

## 2016-03-12 NOTE — Telephone Encounter (Signed)
Metoprolol is not listed on patients current med list as it was removed as follows, however I spoke with the patient and she stated that she is still taking it and was not aware that she was ever supposed to stop. Ok to refill? Please advise. Thanks, MI metoprolol succinate (TOPROL-XL) 25 MG 24 hr tablet [id number] DISCONTINUED  Order Details  Dose: 25 mg Route: Oral Frequency: 2 times daily  Dispense Quantity:  180 tablet Refills:  3 Fills remaining:  --        Sig: Take 1 tablet (25 mg total) by mouth 2 (two) times daily.       Discontinue Date:  11/29/2015 [place]:  Tinnie Gens, CPhT Discontinue Reason:  Discontinued by provider  Written Date:  02/14/15 Expiration Date:  02/14/16    Start Date:  02/14/15 End Date:  11/29/15         Ordering Provider:  Imogene Burn, PA-C DEA #:  [id number] NPI:  [id number]   Authorizing Provider:  Imogene Burn, PA-C DEA #:  [id number] NPI:  [id number]   Ordering User:  Tamsen Snider            Original Order:  metoprolol succinate (TOPROL-XL) 25 MG 24 hr tablet [id number]

## 2016-03-23 ENCOUNTER — Other Ambulatory Visit: Payer: Self-pay | Admitting: Physician Assistant

## 2016-05-10 ENCOUNTER — Other Ambulatory Visit: Payer: Self-pay | Admitting: Physician Assistant

## 2016-08-27 ENCOUNTER — Encounter: Payer: Self-pay | Admitting: Neurology

## 2016-08-27 ENCOUNTER — Ambulatory Visit (INDEPENDENT_AMBULATORY_CARE_PROVIDER_SITE_OTHER): Payer: Medicare Other | Admitting: Neurology

## 2016-08-27 VITALS — BP 180/89 | HR 66 | Resp 20 | Ht 63.0 in | Wt 192.0 lb

## 2016-08-27 DIAGNOSIS — G250 Essential tremor: Secondary | ICD-10-CM | POA: Diagnosis not present

## 2016-08-27 MED ORDER — PRIMIDONE 50 MG PO TABS: 100.0000 mg | ORAL_TABLET | Freq: Every day | ORAL | 3 refills | Status: DC

## 2016-08-27 NOTE — Patient Instructions (Addendum)
We will continue with your mysoline 50 mg, 2 pills at bedtime.   You look well, tremor stable and minimal.

## 2016-08-27 NOTE — Progress Notes (Signed)
fSubjective:    Patient ID: [name] is a 71 y.o. female.  HPI     Interim history:   [name] is a 71 year old right-handed woman with an underlying medical history of coronary artery disease, status post stent placement, paroxysmal A. fib, status post ablation, chronic diarrhea, fecal incontinence, borderline diabetes, hypertension, reflux disease, kidney stones, arthritis, status post right total knee arthroplasty in 3/16 and recent s/p L TKA in 6/17, recurrent UTIs, and obesity, who presents for follow-up consultation of [name] essential tremor. The patient is unaccompanied today. I last saw [name] on 02/21/2016, at which time she reported doing better with respect to [name] tremors with Mysoline. She denied any side effects. She did have left total knee replacement surgery in the interim on 12/20/2015. She had some residual knee pain. She was in outpatient physical therapy. I suggested she continue with generic Mysoline 100 mg each night.  Today, 08/27/2016: She reports doing well with the mysoline. She denies any side effects from it, she feels that it has helped [name] head tremor, she had minimal hand tremors to begin with. She has intermittent mild residual pain in [name] knees, is able to walk, is quite active physically. She has no new complaints or new medical issues, no new medications, denies any issues with high blood pressure today, no symptoms from higher blood pressure values, no blurry vision, no headache, no chest pain or shortness of breath. She takes [name] medication on a regular basis, did not skip BP meds today.  The patient's allergies, current medications, family history, past medical history, past social history, past surgical history and problem list were reviewed and updated as appropriate.   Previously (copied from previous notes for reference):   I first met [name] on 10/06/2015 at the request of [name] primary care provider, at which time she reported a long-standing history of  head tremors, possibly since teenage years. [name] history and physical exam were in keeping with essential tremor. I suggested a cautious trial of Mysoline with titration.    10/06/2015: She reports a long-standing history of head tremors and to a much lesser degree in [name] hands. She recalls that she had tremors when she was 77 or even 71 years old. She has a strong family history of tremors in both parents, younger sister who is much more affected by [name] tremor and both younger brothers, one had to retire early cousin of his tremor. [name] tremor does not bother [name] very much but she notices it from time to time, especially when she is fatigued. 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 Sometimes with [name] earrings she needs help to put them in. Otherwise, she has no significant difficulty with feeding or dressing, self hygiene or ADLs otherwise. She is married and lives with [name] husband. He has not mentioned any voice tremor to [name]. She does note [name] head tremor. She has 2 grown children. She drinks alcohol in the form of wine, usually once a month or so and has not noticed any improvement in [name] tremor or did not pay enough attention to it. She drinks coffee 1 cup a day, she quit smoking in 1990. She tries to stay active physically. She has not noticed much in the way of balance problems. She had left knee arthroscopic surgery in November 2016 and right knee replacement surgery in March 2016. She still has some knee discomfort and is currently in home health physical therapy.   [name] Past Medical  History Is Significant For: Past Medical History:  Diagnosis Date  . A-fib ([place])   . Arthritis    osteoarthritis  . Borderline diabetes   . Chronic diarrhea   . Coronary artery disease    a.  s/p Xience DES to RCA 04/2009;   b. TEE 2/12: EF 40%, Large PFO;  c.  Lexiscan Myoview 05/2012: EF 69%, no ischemia. LHC (05/2012):  Ostial diagonal 30-40%, proximal mid ramus intermedius 40-50%, RCA stent  patent with 40-50% after stent, then 40%, distal RCA 40-50%, EF 55-65%. Medical therapy continued.;  d.  Carlton Adam Myoview (06/2013):  No ischemia, EF 83%, normal study  . Dysrhythmia   . Fecal incontinence   . GERD (gastroesophageal reflux disease)    occasional  . History of basal cell carcinoma excision    scalp  . History of hiatal hernia   . History of kidney stones 51 years ago   x1  . History of recurrent UTIs   . Hypertension   . PAF (paroxysmal atrial fibrillation) ([place])   . S/P ablation of atrial fibrillation   . S/P drug eluting coronary stent placement   . Tremor   . Wears glasses     [name] Past Surgical History Is Significant For: Past Surgical History:  Procedure Laterality Date  . ANAL RECTAL MANOMETRY N/A 01/11/2014   Procedure: ANAL RECTAL MANOMETRY;  Surgeon: Leighton Ruff, MD;  Location: [facility];  Service: Endoscopy;  Laterality: N/A;  . APPENDECTOMY  1973  . BACK SURGERY     laminectomy times 2; 2000 and 2001  . BUNIONECTOMY Bilateral 2013  . CARDIAC CATHETERIZATION  10-24-2010   dr allred   singl-vessel CAD with patent stent mRCA/  moderate disease mRCA beyond stent segment/  normal lvsf  . CARDIAC ELECTROPHYSIOLOGY MAPPING AND ABLATION  08-15-2010  dr allred  . CARDIOVASCULAR STRESS TEST  06-15-2013  dr allred   normal perfusion study/  no ischemia/  ef 83%  . CARPAL TUNNEL RELEASE Right 25 years ago  . CHOLECYSTECTOMY OPEN  1973   W/  APPENDECTOMY  . CORONARY ANGIOPLASTY WITH STENT PLACEMENT  04-04-2009  dr Darnell Level brodie   PCI and DES x1 to  mRCA/  mLAD 40%, pCX 30%, pRCA 30%/  normal LVF  . FLEXIBLE SIGMOIDOSCOPY N/A 07/07/2013   Procedure: FLEXIBLE SIGMOIDOSCOPY;  Surgeon: Winfield Cunas., MD;  Location: [facility];  Service: Endoscopy;  Laterality: N/A;  unprepped  . LEFT HEART CATHETERIZATION WITH CORONARY ANGIOGRAM N/A 05/09/2012   Procedure: LEFT HEART CATHETERIZATION WITH CORONARY ANGIOGRAM;  Surgeon: Hillary Bow, MD;  Location: [facility] CATH  LAB;  Service: Cardiovascular;  Laterality: N/A;  . MOHS SURGERY  ~6 years ago   scalp  . PILONIDAL CYST EXCISION  2009;   2000;   1999  . RECTAL ULTRASOUND N/A 01/11/2014   Procedure: RECTAL ULTRASOUND;  Surgeon: Leighton Ruff, MD;  Location: [facility];  Service: Endoscopy;  Laterality: N/A;  . REFRACTIVE SURGERY Bilateral 16 years ago  . TOTAL KNEE ARTHROPLASTY Right 09/07/2014   Procedure: RIGHT TOTAL KNEE ARTHROPLASTY;  Surgeon: Paralee Cancel, MD;  Location: [facility] ORS;  Service: Orthopedics;  Laterality: Right;  . TOTAL KNEE ARTHROPLASTY Left 12/20/2015   Procedure: LEFT TOTAL KNEE ARTHROPLASTY;  Surgeon: Paralee Cancel, MD;  Location: [facility] OR[facility];  Service: Orthopedics;  Laterality: Left;  . TRANSTHORACIC ECHOCARDIOGRAM  09-19-2011   grade I diastolic dysfunction/  ef 55-60%/  mild TR  . VAGINAL HYSTERECTOMY  1975   W/ BILATERAL SALPINOOPHOORECTOMY  .  VAGUS NERVE STIMULATOR INSERTION N/A 03/10/2014   Procedure: IMPLANTATION OF SACRAL  NERVE STIMULATOR ;  Surgeon: Leighton Ruff, MD;  Location: [facility];  Service: General;  Laterality: N/A;  Sacral. [employer]    [name] Family History Is Significant For: Family History  Problem Relation Age of Onset  . Coronary artery disease Father   . Heart disease Mother   . Heart disease Sister     [name] Social History Is Significant For: Social History   Social History  . Marital status: Married    Spouse name: N/A  . Number of children: 2  . Years of education: HS   Occupational History  . retired Retired   Social History Main Topics  . Smoking status: Former Smoker    Packs/day: 0.50    Years: 20.00    Types: Cigarettes    Quit date: 07/02/1988  . Smokeless tobacco: Never Used  . Alcohol use No  . Drug use: No  . Sexual activity: Not Asked   Other Topics Concern  . None   Social History Narrative  . None    [name] Allergies Are:  Allergies  Allergen Reactions  . Cephalexin Nausea Only  :   [name] Current Medications Are:   Outpatient Encounter Prescriptions as of 08/27/2016  Medication Sig  . diphenhydrAMINE (BENADRYL) 25 mg capsule Take 25 mg by mouth at bedtime as needed for sleep.  [name] lisinopril (PRINIVIL,ZESTRIL) 10 MG tablet TAKE 1 TABLET DAILY  . metoprolol succinate (TOPROL-XL) 25 MG 24 hr tablet Take 1 tablet (25 mg total) by mouth 2 (two) times daily.  . nitroGLYCERIN (NITROSTAT) 0.4 MG SL tablet Place 0.4 mg under the tongue every 5 (five) minutes as needed for chest pain.  . pantoprazole (PROTONIX) 40 MG tablet Take 40 mg by mouth daily.  [name] PRADAXA 150 MG CAPS capsule TAKE 1 CAPSULE EVERY 12 HOURS  . primidone (MYSOLINE) 50 MG tablet Take 2 tablets (100 mg total) by mouth at bedtime.  . rosuvastatin (CRESTOR) 40 MG tablet TAKE 1 TABLET DAILY  . TraMADol HCl 200 MG CP24 Take 1 tablet by mouth daily as needed. Pain  . traZODone (DESYREL) 50 MG tablet Take 150 mg by mouth at bedtime.    No facility-administered encounter medications on file as of 08/27/2016.   :  Review of Systems:  Out of a complete 14 point review of systems, all are reviewed and negative with the exception of these symptoms as listed below: Review of Systems  Neurological:       Pt presents today to discuss [name] tremor. Pt says that [name] tremor has remained stable on primidone.    Objective:  Neurologic Exam  Physical Exam Physical Examination:   Vitals:   08/27/16 1404  BP: (!) 180/89  Pulse: 66  Resp: 20    General Examination: The patient is a very pleasant 71 y.o. female in no acute distress. She appears well-developed and well-nourished and well groomed. Good spirits today.   HEENT: Normocephalic, atraumatic, pupils are equal, round and reactive to light and accommodation. Extraocular tracking is good without limitation to gaze excursion or nystagmus noted. Normal smooth pursuit is noted. Hearing is grossly intact. Face is symmetric with normal facial animation and normal facial sensation. Speech is clear with no  dysarthria noted. There is no hypophonia. There is a mild intermittent head and neck tremor, no jaw or lip tremor. Oropharynx exam reveals: mild mouth dryness, adequate dental hygiene and mild airway crowding. Mallampati is class  II. Tongue protrudes centrally and palate elevates symmetrically.   Chest: Clear to auscultation without wheezing, rhonchi or crackles noted.  Heart: S1+S2+0, regular and normal without murmurs, rubs or gallops noted.   Abdomen: Soft, non-tender and non-distended with normal bowel sounds appreciated on auscultation.  Extremities: There is no pitting edema in the distal lower extremities bilaterally. Pedal pulses are intact.  Skin: Warm and dry without trophic changes noted.  Musculoskeletal: exam reveals no obvious joint deformities, tenderness or joint swelling or erythema. Mild decrease in range of motion in both knees, mild discomfort in both knees, mild left knee swelling.    Neurologically:  Mental status: The patient is awake, alert and oriented in all 4 spheres. [name] immediate and remote memory, attention, language skills and fund of knowledge are appropriate. There is no evidence of aphasia, agnosia, apraxia or anomia. Speech is clear with normal prosody and enunciation. Thought process is linear. Mood is normal and affect is normal.  Cranial nerves II - XII are as described above under HEENT exam. In addition: shoulder shrug is normal with equal shoulder height noted. Motor exam: Normal bulk, strength and tone is noted. There is no drift, or rebound. She has a minimal postural tremor and action tremor in both upper extremities, Romberg is negative. Reflexes are 2+ throughout. Fine motor skills and coordination: intact with normal finger taps, normal hand movements, normal rapid alternating patting, normal foot taps and normal foot agility.  Cerebellar testing: No dysmetria or intention tremor on finger to nose testing. Heel to shin is unremarkable bilaterally.  There is no truncal or gait ataxia.  Sensory exam: intact to light touch in the upper and lower extremities.  Gait, station and balance: She stands easily. No veering to one side is noted. No leaning to one side is noted. Posture is age-appropriate and stance is narrow based. Gait shows normal stride length and normal pace. No problems turning are noted. Tandem walk is unremarkable.  Assessment and plan:  In summary, EMBREE BRAWLEY is a very pleasant 71 y.o.-year old female with anUnderlying medical history of coronary artery disease, status post stent placement, paroxysmal A. fib with status post ablation, history of diarrhea and fecal incontinence, borderline diabetes, hypertension, overweight state, status post bilateral knee replacement surgeries in March 2016 and June 2017 respectively, history of kidney stones, reflux disease, recurrent UTIs, who presents for follow-up consultation of [name] essential tremor which has affected [name] head and neck area more than [name] hands. Exam is stable and has improved on low-dose Mysoline, currently 100 mg at night, generic. She is tolerating it, reports no side effects and reports ongoing benefit. [name] exam looks good and stable, slightly improved from when we first met, she has a long-standing history of tremors since even [name] teenage years as well as a family history of tremors. I suggested we continue with Mysoline at the current dose, I entered a 90 day prescription with refills. I suggested a one-year checkup at this point, I answered all [name] questions today and she was in agreement. I spent 20 minutes in total face-to-face time with the patient, more than 50% of which was spent in counseling and coordination of care, reviewing test results, reviewing medication and discussing or reviewing the diagnosis of ET, its prognosis and treatment options. Pertinent laboratory and imaging test results that were available during this visit with the patient were reviewed by me  and considered in my medical decision making (see chart for details).

## 2017-02-04 ENCOUNTER — Other Ambulatory Visit: Payer: Self-pay | Admitting: Internal Medicine

## 2017-03-01 ENCOUNTER — Other Ambulatory Visit: Payer: Self-pay | Admitting: Internal Medicine

## 2017-03-07 ENCOUNTER — Other Ambulatory Visit: Payer: Self-pay | Admitting: Internal Medicine

## 2017-03-07 ENCOUNTER — Other Ambulatory Visit: Payer: Self-pay | Admitting: Physician Assistant

## 2017-03-27 ENCOUNTER — Encounter: Payer: Self-pay | Admitting: Internal Medicine

## 2017-03-27 ENCOUNTER — Ambulatory Visit (INDEPENDENT_AMBULATORY_CARE_PROVIDER_SITE_OTHER): Payer: Medicare Other | Admitting: Internal Medicine

## 2017-03-27 VITALS — BP 150/84 | HR 63 | Ht 62.0 in | Wt 204.6 lb

## 2017-03-27 DIAGNOSIS — R601 Generalized edema: Secondary | ICD-10-CM | POA: Diagnosis not present

## 2017-03-27 DIAGNOSIS — I10 Essential (primary) hypertension: Secondary | ICD-10-CM | POA: Diagnosis not present

## 2017-03-27 DIAGNOSIS — I480 Paroxysmal atrial fibrillation: Secondary | ICD-10-CM | POA: Diagnosis not present

## 2017-03-27 MED ORDER — HYDROCHLOROTHIAZIDE 12.5 MG PO TABS: 12.5000 mg | ORAL_TABLET | Freq: Every day | ORAL | 11 refills | Status: DC

## 2017-03-27 NOTE — Patient Instructions (Addendum)
Medication Instructions:  Your physician has recommended you make the following change in your medication:  1) START Hydrochlorothiazide 12.5 mg once daily   Labwork: Your physician recommends that you return for lab work: Same day as Echo, BMET   Testing/Procedures: Your physician has requested that you have an echocardiogram. Echocardiography is a painless test that uses sound waves to create images of your heart. It provides your doctor with information about the size and shape of your heart and how well your heart's chambers and valves are working. This procedure takes approximately one hour. There are no restrictions for this procedure.    Follow-Up: Your physician wants you to follow-up in: 12 months with Dr. Rayann Heman. You will receive a reminder letter in the mail two months in advance. If you don't receive a letter, please call our office to schedule the follow-up appointment.   Any Other Special Instructions Will Be Listed Below (If Applicable).   Low-Sodium Eating Plan - Limit your sodium to 2000 mg daily Sodium, which is an element that makes up salt, helps you maintain a healthy balance of fluids in your body. Too much sodium can increase your blood pressure and cause fluid and waste to be held in your body. Your health care provider or dietitian may recommend following this plan if you have high blood pressure (hypertension), kidney disease, liver disease, or heart failure. Eating less sodium can help lower your blood pressure, reduce swelling, and protect your heart, liver, and kidneys. What are tips for following this plan? General guidelines  Most people on this plan should limit their sodium intake to 1,500-2,000 mg (milligrams) of sodium each day. Reading food labels  The Nutrition Facts label lists the amount of sodium in one serving of the food. If you eat more than one serving, you must multiply the listed amount of sodium by the number of servings.  Choose foods  with less than 140 mg of sodium per serving.  Avoid foods with 300 mg of sodium or more per serving. Shopping  Look for lower-sodium products, often labeled as "low-sodium" or "no salt added."  Always check the sodium content even if foods are labeled as "unsalted" or "no salt added".  Buy fresh foods. ? Avoid canned foods and premade or frozen meals. ? Avoid canned, cured, or processed meats  Buy breads that have less than 80 mg of sodium per slice. Cooking  Eat more home-cooked food and less restaurant, buffet, and fast food.  Avoid adding salt when cooking. Use salt-free seasonings or herbs instead of table salt or sea salt. Check with your health care provider or pharmacist before using salt substitutes.  Cook with plant-based oils, such as canola, sunflower, or olive oil. Meal planning  When eating at a restaurant, ask that your food be prepared with less salt or no salt, if possible.  Avoid foods that contain MSG (monosodium glutamate). MSG is sometimes added to [place] food, bouillon, and some canned foods. What foods are recommended? The items listed may not be a complete list. Talk with your dietitian about what dietary choices are best for you. Grains Low-sodium cereals, including oats, puffed wheat and rice, and shredded wheat. Low-sodium crackers. Unsalted rice. Unsalted pasta. Low-sodium bread. Whole-grain breads and whole-grain pasta. Vegetables Fresh or frozen vegetables. "No salt added" canned vegetables. "No salt added" tomato sauce and paste. Low-sodium or reduced-sodium tomato and vegetable juice. Fruits Fresh, frozen, or canned fruit. Fruit juice. Meats and other protein foods Fresh or frozen (no salt  added) meat, poultry, seafood, and fish. Low-sodium canned tuna and salmon. Unsalted nuts. Dried peas, beans, and lentils without added salt. Unsalted canned beans. Eggs. Unsalted nut butters. Dairy Milk. Soy milk. Cheese that is naturally low in sodium, such as  ricotta cheese, fresh mozzarella, or Swiss cheese Low-sodium or reduced-sodium cheese. Cream cheese. Yogurt. Fats and oils Unsalted butter. Unsalted margarine with no trans fat. Vegetable oils such as canola or olive oils. Seasonings and other foods Fresh and dried herbs and spices. Salt-free seasonings. Low-sodium mustard and ketchup. Sodium-free salad dressing. Sodium-free light mayonnaise. Fresh or refrigerated horseradish. Lemon juice. Vinegar. Homemade, reduced-sodium, or low-sodium soups. Unsalted popcorn and pretzels. Low-salt or salt-free chips. What foods are not recommended? The items listed may not be a complete list. Talk with your dietitian about what dietary choices are best for you. Grains Instant hot cereals. Bread stuffing, pancake, and biscuit mixes. Croutons. Seasoned rice or pasta mixes. Noodle soup cups. Boxed or frozen macaroni and cheese. Regular salted crackers. Self-rising flour. Vegetables Sauerkraut, pickled vegetables, and relishes. Olives. [place] fries. Onion rings. Regular canned vegetables (not low-sodium or reduced-sodium). Regular canned tomato sauce and paste (not low-sodium or reduced-sodium). Regular tomato and vegetable juice (not low-sodium or reduced-sodium). Frozen vegetables in sauces. Meats and other protein foods Meat or fish that is salted, canned, smoked, spiced, or pickled. Bacon, ham, sausage, hotdogs, corned beef, chipped beef, packaged lunch meats, salt pork, jerky, pickled herring, anchovies, regular canned tuna, sardines, salted nuts. Dairy Processed cheese and cheese spreads. Cheese curds. Blue cheese. Feta cheese. String cheese. Regular cottage cheese. Buttermilk. Canned milk. Fats and oils Salted butter. Regular margarine. Ghee. Bacon fat. Seasonings and other foods Onion salt, garlic salt, seasoned salt, table salt, and sea salt. Canned and packaged gravies. Worcestershire sauce. Tartar sauce. Barbecue sauce. Teriyaki sauce. Soy sauce,  including reduced-sodium. Steak sauce. Fish sauce. Oyster sauce. Cocktail sauce. Horseradish that you find on the shelf. Regular ketchup and mustard. Meat flavorings and tenderizers. Bouillon cubes. Hot sauce and Tabasco sauce. Premade or packaged marinades. Premade or packaged taco seasonings. Relishes. Regular salad dressings. Salsa. Potato and tortilla chips. Corn chips and puffs. Salted popcorn and pretzels. Canned or dried soups. Pizza. Frozen entrees and pot pies. Summary  Eating less sodium can help lower your blood pressure, reduce swelling, and protect your heart, liver, and kidneys.  Most people on this plan should limit their sodium intake to 1,500-2,000 mg (milligrams) of sodium each day.  Canned, boxed, and frozen foods are high in sodium. Restaurant foods, fast foods, and pizza are also very high in sodium. You also get sodium by adding salt to food.  Try to cook at home, eat more fresh fruits and vegetables, and eat less fast food, canned, processed, or prepared foods. This information is not intended to replace advice given to you by your health care provider. Make sure you discuss any questions you have with your health care provider. Document Released: 12/08/2001 Document Revised: 06/11/2016 Document Reviewed: 06/11/2016 Elsevier Interactive Patient Education  2017 [employer].    If you need a refill on your cardiac medications before your next appointment, please call your pharmacy.

## 2017-03-27 NOTE — Progress Notes (Signed)
PCP: Fransisca Connors, PA-C  Primary EP: Dr Rayann Heman  [name] is a 71 y.o. female who presents today for routine electrophysiology followup.  Since last being seen in our clinic, the patient reports doing very well.  No afib since last visit.  + edema.  She noticed edema first after starting amlodipine.  She stopped amlodipine in July but continues to have swelling.  BP is elevated.  She and her spouse have a pull behind trailor and have been traveling to [place], [place], etc.  She seems to be doing very well.  Today, she denies symptoms of palpitations, chest pain, shortness of breath,  dizziness, presyncope, or syncope.  The patient is otherwise without complaint today.   Past Medical History:  Diagnosis Date  . A-fib ([place])   . Arthritis    osteoarthritis  . Borderline diabetes   . Chronic diarrhea   . Coronary artery disease    a.  s/p Xience DES to RCA 04/2009;   b. TEE 2/12: EF 40%, Large PFO;  c.  Lexiscan Myoview 05/2012: EF 69%, no ischemia. LHC (05/2012):  Ostial diagonal 30-40%, proximal mid ramus intermedius 40-50%, RCA stent patent with 40-50% after stent, then 40%, distal RCA 40-50%, EF 55-65%. Medical therapy continued.;  d.  Carlton Adam Myoview (06/2013):  No ischemia, EF 83%, normal study  . Dysrhythmia   . Fecal incontinence   . GERD (gastroesophageal reflux disease)    occasional  . History of basal cell carcinoma excision    scalp  . History of hiatal hernia   . History of kidney stones 51 years ago   x1  . History of recurrent UTIs   . Hypertension   . PAF (paroxysmal atrial fibrillation) ([place])   . S/P ablation of atrial fibrillation   . S/P drug eluting coronary stent placement   . Tremor   . Wears glasses    Past Surgical History:  Procedure Laterality Date  . ANAL RECTAL MANOMETRY N/A 01/11/2014   Procedure: ANAL RECTAL MANOMETRY;  Surgeon: Leighton Ruff, MD;  Location: [facility];  Service: Endoscopy;  Laterality: N/A;  . APPENDECTOMY  1973  . BACK  SURGERY     laminectomy times 2; 2000 and 2001  . BUNIONECTOMY Bilateral 2013  . CARDIAC CATHETERIZATION  10-24-2010   dr Eulises Kijowski   singl-vessel CAD with patent stent mRCA/  moderate disease mRCA beyond stent segment/  normal lvsf  . CARDIAC ELECTROPHYSIOLOGY MAPPING AND ABLATION  08-15-2010  dr Melford Tullier  . CARDIOVASCULAR STRESS TEST  06-15-2013  dr Meili Kleckley   normal perfusion study/  no ischemia/  ef 83%  . CARPAL TUNNEL RELEASE Right 25 years ago  . CHOLECYSTECTOMY OPEN  1973   W/  APPENDECTOMY  . CORONARY ANGIOPLASTY WITH STENT PLACEMENT  04-04-2009  dr Darnell Level brodie   PCI and DES x1 to  mRCA/  mLAD 40%, pCX 30%, pRCA 30%/  normal LVF  . FLEXIBLE SIGMOIDOSCOPY N/A 07/07/2013   Procedure: FLEXIBLE SIGMOIDOSCOPY;  Surgeon: Winfield Cunas., MD;  Location: [facility];  Service: Endoscopy;  Laterality: N/A;  unprepped  . LEFT HEART CATHETERIZATION WITH CORONARY ANGIOGRAM N/A 05/09/2012   Procedure: LEFT HEART CATHETERIZATION WITH CORONARY ANGIOGRAM;  Surgeon: Hillary Bow, MD;  Location: [facility] CATH LAB;  Service: Cardiovascular;  Laterality: N/A;  . MOHS SURGERY  ~6 years ago   scalp  . PILONIDAL CYST EXCISION  2009;   2000;   1999  . RECTAL ULTRASOUND N/A 01/11/2014   Procedure: RECTAL  ULTRASOUND;  Surgeon: Leighton Ruff, MD;  Location: [facility];  Service: Endoscopy;  Laterality: N/A;  . REFRACTIVE SURGERY Bilateral 16 years ago  . TOTAL KNEE ARTHROPLASTY Right 09/07/2014   Procedure: RIGHT TOTAL KNEE ARTHROPLASTY;  Surgeon: Paralee Cancel, MD;  Location: [facility];  Service: Orthopedics;  Laterality: Right;  . TOTAL KNEE ARTHROPLASTY Left 12/20/2015   Procedure: LEFT TOTAL KNEE ARTHROPLASTY;  Surgeon: Paralee Cancel, MD;  Location: [facility];  Service: Orthopedics;  Laterality: Left;  . TRANSTHORACIC ECHOCARDIOGRAM  09-19-2011   grade I diastolic dysfunction/  ef 55-60%/  mild TR  . VAGINAL HYSTERECTOMY  1975   W/ BILATERAL SALPINOOPHOORECTOMY  . VAGUS NERVE STIMULATOR INSERTION N/A 03/10/2014    Procedure: IMPLANTATION OF SACRAL  NERVE STIMULATOR ;  Surgeon: Leighton Ruff, MD;  Location: [place];  Service: General;  Laterality: N/A;  Sacral. [employer]    ROS- all systems are reviewed and negatives except as per HPI above  Current Outpatient Prescriptions  Medication Sig Dispense Refill  . diphenhydrAMINE (BENADRYL) 25 mg capsule Take 25 mg by mouth at bedtime as needed for sleep.    [name] lisinopril (PRINIVIL,ZESTRIL) 10 MG tablet TAKE 1 TABLET DAILY 15 tablet 0  . metoprolol succinate (TOPROL XL) 25 MG 24 hr tablet Take 1 tablet (25 mg total) by mouth 2 (two) times daily. 180 tablet 0  . nitroGLYCERIN (NITROSTAT) 0.4 MG SL tablet Place 0.4 mg under the tongue every 5 (five) minutes as needed for chest pain.    . pantoprazole (PROTONIX) 40 MG tablet Take 40 mg by mouth daily.    [name] PRADAXA 150 MG CAPS capsule TAKE 1 CAPSULE EVERY 12 HOURS 90 capsule 0  . primidone (MYSOLINE) 50 MG tablet Take 2 tablets (100 mg total) by mouth at bedtime. 180 tablet 3  . rosuvastatin (CRESTOR) 40 MG tablet TAKE 1 TABLET DAILY 30 tablet 0  . traZODone (DESYREL) 50 MG tablet Take 150 mg by mouth at bedtime.      No current facility-administered medications for this visit.     Physical Exam: Vitals:   03/27/17 1642  BP: (!) 150/84  Pulse: 63  SpO2: 96%  Weight: 204 lb 9.6 oz (92.8 kg)  Height: 5\' 2"  (1.575 m)    GEN- The patient is well appearing, alert and oriented x 3 today.   Head- normocephalic, atraumatic Eyes-  Sclera clear, conjunctiva pink Ears- hearing intact Oropharynx- clear Lungs- Clear to ausculation bilaterally, normal work of breathing Heart- Regular rate and rhythm, no murmurs, rubs or gallops, PMI not laterally displaced GI- soft, NT, ND, + BS Extremities- no clubbing, cyanosis, trace edema  EKG tracing ordered today is personally reviewed and shows sinus rhythm, normal ekg  Assessment and Plan:  1. Atrial fibrillation Resolved s/p ablation in  2012 Continue on pradaxa (chads2vasc score is 5) Echo  2. Edema Bmet, echo Avoid salt  Add hctz 12.5mg  daily  3. CAD No ischemic symptoms No changes  4. HL PCP following lipipds  5. Hypertensive cardiovascular disease Elevated BP today 2 gram sodium restriction Add hctz 12.5mg  daily bmet Follow-up with PCP for further management  6. Obesity Body mass index is 37.42 kg/m. Lifestyle modification encouraged  Return in a year  Thompson Grayer MD, [facility] 03/27/2017 4:58 PM

## 2017-03-28 ENCOUNTER — Other Ambulatory Visit: Payer: Self-pay

## 2017-03-28 MED ORDER — HYDROCHLOROTHIAZIDE 12.5 MG PO TABS: 12.5000 mg | ORAL_TABLET | Freq: Every day | ORAL | 11 refills | Status: DC

## 2017-04-03 ENCOUNTER — Other Ambulatory Visit: Payer: Medicare Other

## 2017-04-03 ENCOUNTER — Ambulatory Visit (HOSPITAL_COMMUNITY): Payer: Medicare Other | Attending: Cardiology

## 2017-04-03 ENCOUNTER — Other Ambulatory Visit: Payer: Self-pay

## 2017-04-03 DIAGNOSIS — I080 Rheumatic disorders of both mitral and aortic valves: Secondary | ICD-10-CM | POA: Insufficient documentation

## 2017-04-03 DIAGNOSIS — R601 Generalized edema: Secondary | ICD-10-CM

## 2017-04-03 DIAGNOSIS — I480 Paroxysmal atrial fibrillation: Secondary | ICD-10-CM | POA: Insufficient documentation

## 2017-04-03 DIAGNOSIS — I10 Essential (primary) hypertension: Secondary | ICD-10-CM | POA: Diagnosis not present

## 2017-04-04 ENCOUNTER — Telehealth: Payer: Self-pay | Admitting: *Deleted

## 2017-04-04 ENCOUNTER — Other Ambulatory Visit: Payer: Self-pay | Admitting: Family Medicine

## 2017-04-04 LAB — BASIC METABOLIC PANEL
BUN / CREAT RATIO: 13 (ref 12–28)
BUN: 16 mg/dL (ref 8–27)
CALCIUM: 8.8 mg/dL (ref 8.7–10.3)
CHLORIDE: 99 mmol/L (ref 96–106)
CO2: 23 mmol/L (ref 20–29)
CREATININE: 1.26 mg/dL — AB (ref 0.57–1.00)
GFR calc non Af Amer: 42 mL/min/{1.73_m2} — ABNORMAL LOW (ref >59)
GFR, EST AFRICAN AMERICAN: 49 mL/min/{1.73_m2} — AB (ref >59)
GLUCOSE: 80 mg/dL (ref 65–99)
Potassium: 4.2 mmol/L (ref 3.5–5.2)
Sodium: 139 mmol/L (ref 134–144)

## 2017-04-04 NOTE — Telephone Encounter (Signed)
-----   Message from Thompson Grayer, MD sent at 04/04/2017  8:11 AM EDT ----- Results reviewed.  Claiborne Billings, please inform pt of result. I will route to primary care also.

## 2017-04-04 NOTE — Telephone Encounter (Signed)
-----   Message from Thompson Grayer, MD sent at 04/04/2017  8:11 AM EDT ----- Results reviewed.  [name], please inform pt of result. I will route to primary care also.

## 2017-04-04 NOTE — Telephone Encounter (Signed)
Patient informed. 

## 2017-04-08 ENCOUNTER — Other Ambulatory Visit: Payer: Self-pay | Admitting: Family Medicine

## 2017-04-09 ENCOUNTER — Other Ambulatory Visit: Payer: Self-pay | Admitting: Family Medicine

## 2017-04-09 DIAGNOSIS — R2232 Localized swelling, mass and lump, left upper limb: Secondary | ICD-10-CM

## 2017-04-15 ENCOUNTER — Ambulatory Visit
Admission: RE | Admit: 2017-04-15 | Discharge: 2017-04-15 | Disposition: A | Discharge status: Home / Self Care | Payer: Medicare Other | Source: Ambulatory Visit | Attending: Family Medicine | Admitting: Family Medicine

## 2017-04-15 DIAGNOSIS — R2232 Localized swelling, mass and lump, left upper limb: Secondary | ICD-10-CM

## 2017-06-06 ENCOUNTER — Other Ambulatory Visit: Payer: Self-pay | Admitting: Internal Medicine

## 2017-07-27 IMAGING — US US EXTREM UP*L* LTD
1 series · 14 of 17 positions shown · non-contrast
Comparison: RIGHT WRIST

CLINICAL DATA: Left arm mass.

EXAM:
ULTRASOUND LEFT UPPER EXTREMITY LIMITED
TECHNIQUE: Ultrasound examination of the upper extremity soft tissues was
performed in the area of clinical concern.

[Series 1: us extrem up*left* ltd · 0.05mm/px · 17 acquisitions, 14 frames shown]
[im 1/17]
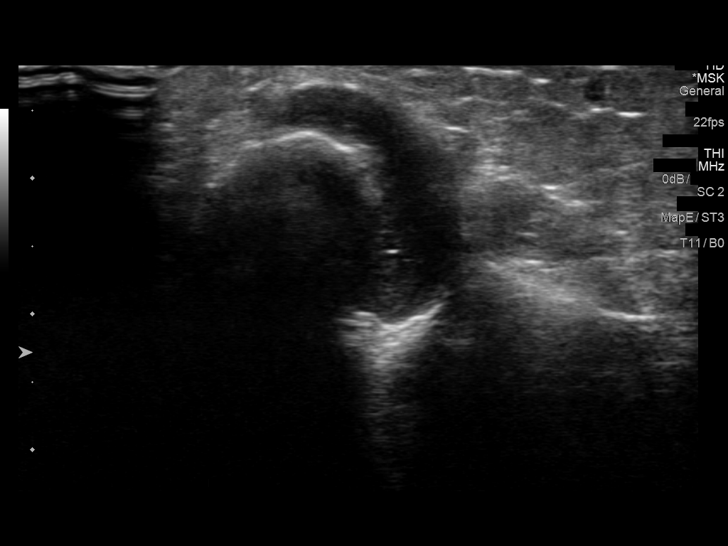
[im 2/17]
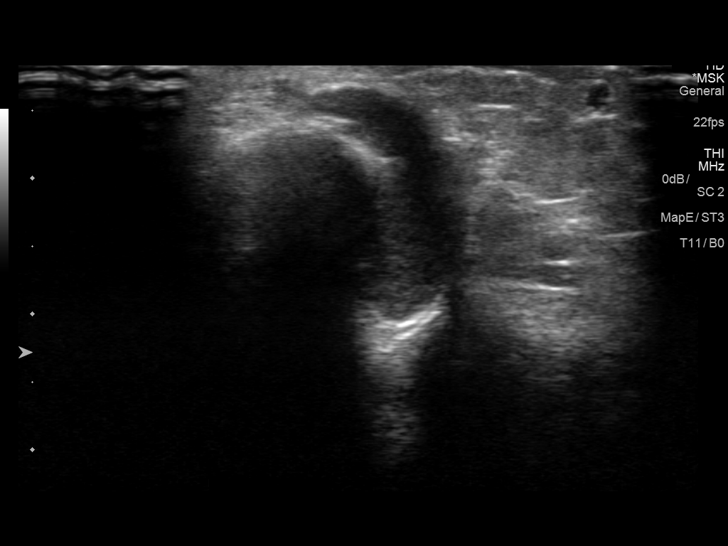
[im 4/17]
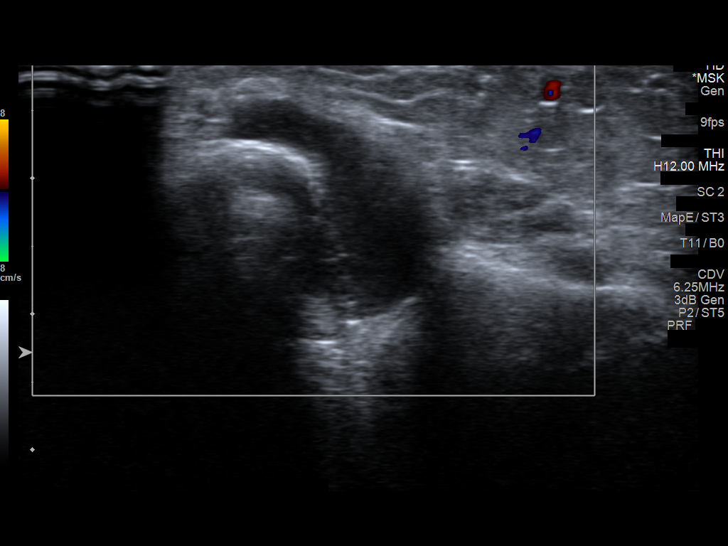
[im 5/17]
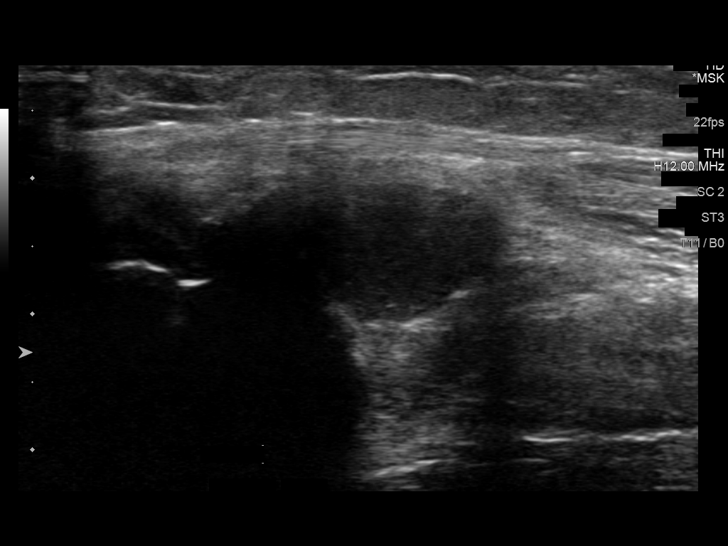
[im 6/17]
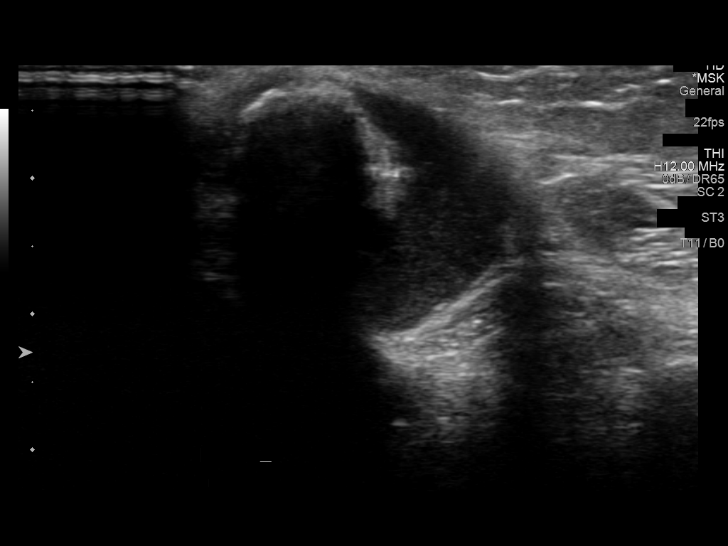
[im 7/17]
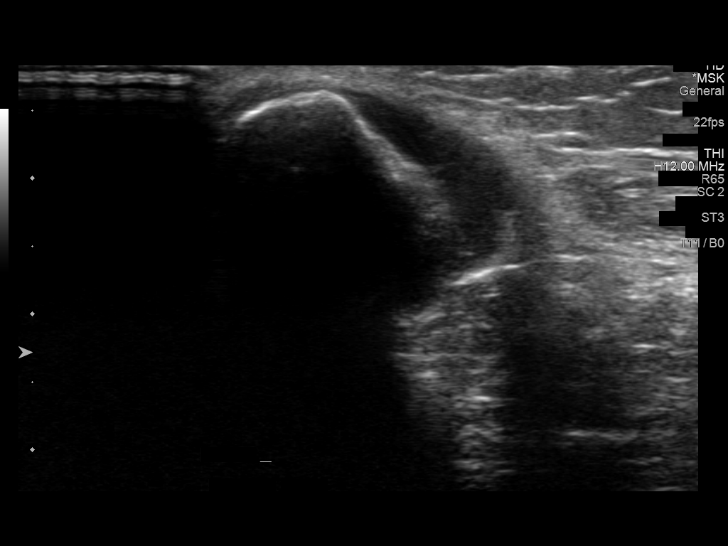
[im 8/17]
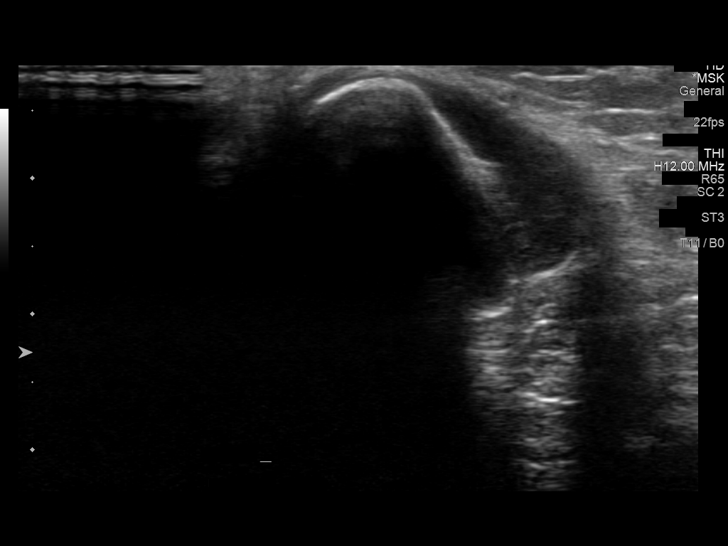
[im 10/17]
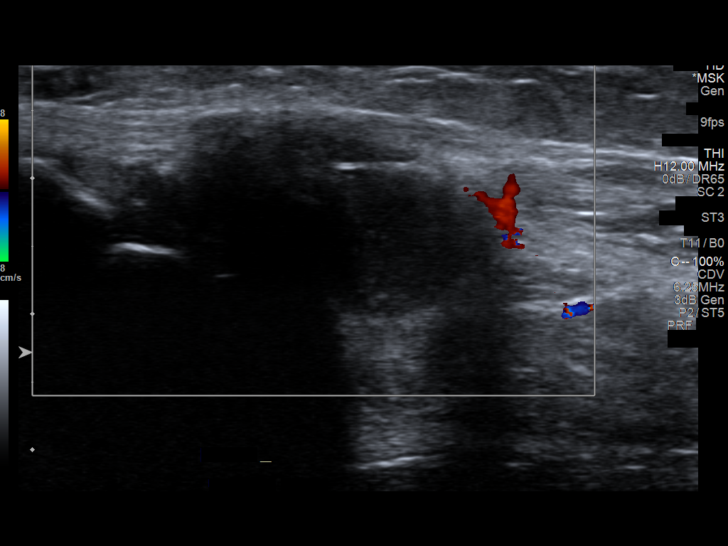
[im 11/17]
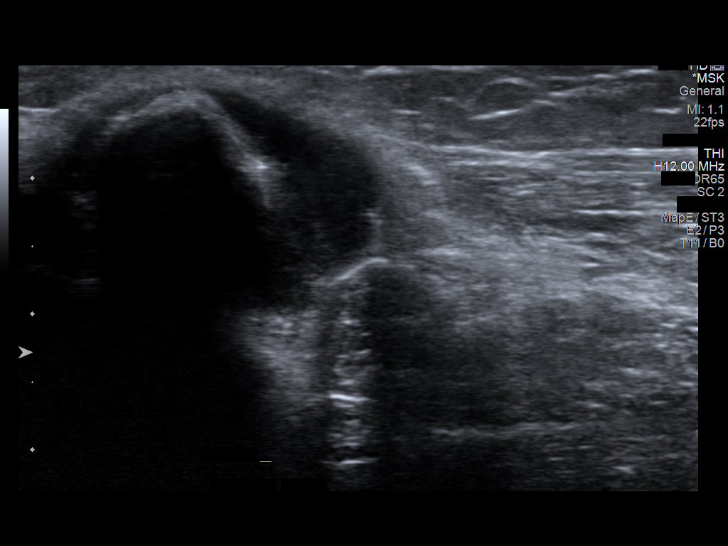
[im 12/17]
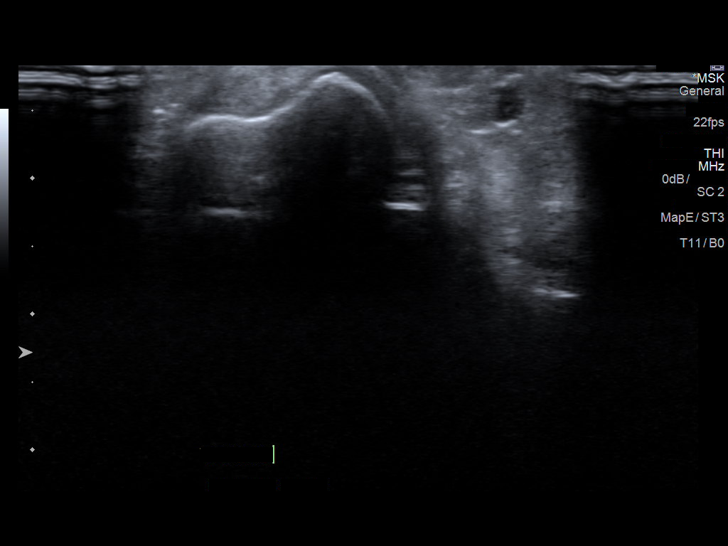
[im 13/17]
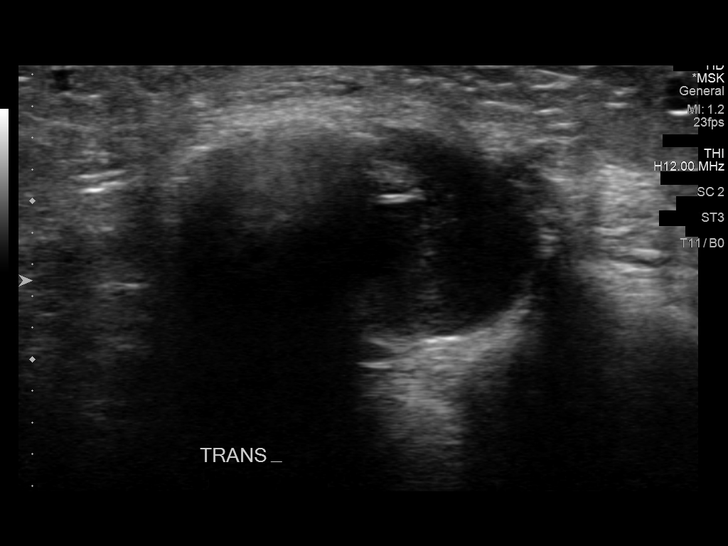
[im 14/17]
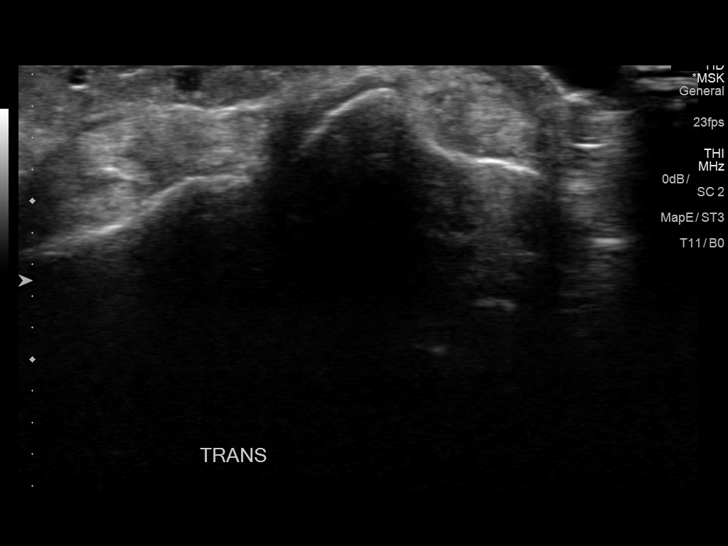
[im 16/17]
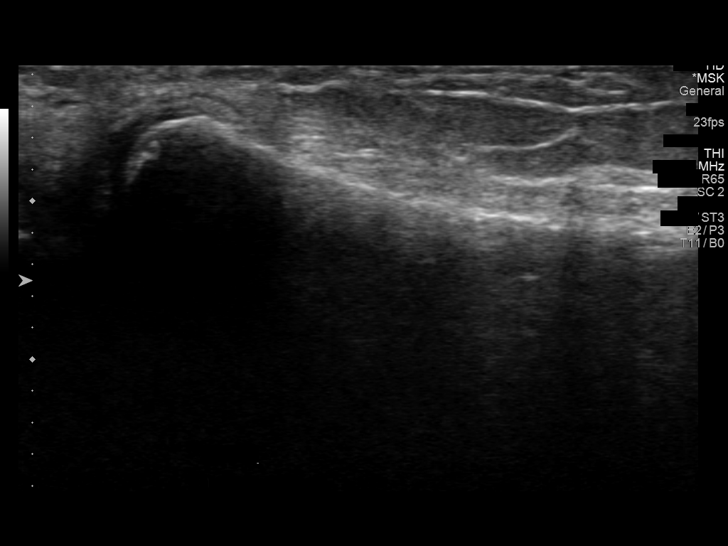
[im 17/17]
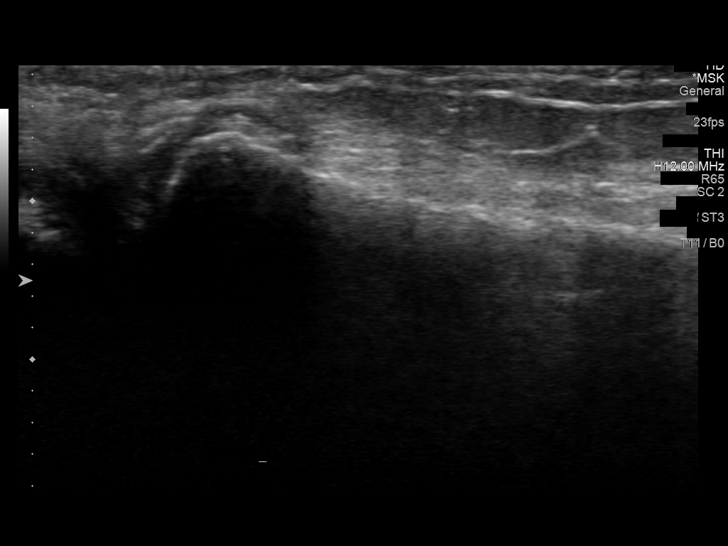

[14 of 17 positions shown; findings below may reference images not displayed]

FINDINGS: The patient has a prominent effusion in the left distal radioulnar
joint. There is no effusion in the right distal radioulnar joint.

There is also small amount of fluid in the tendon sheath of the
extensor carpi ulnaris just distal to the ulna.
IMPRESSION: 1. Prominent effusion in the distal radioulnar joint.
2. Focal mild tenosynovitis of the extensor carpi ulnaris just
distal to the ulna.

## 2017-08-22 ENCOUNTER — Other Ambulatory Visit: Payer: Self-pay | Admitting: Neurology

## 2017-08-22 ENCOUNTER — Other Ambulatory Visit: Payer: Self-pay | Admitting: Internal Medicine

## 2017-08-22 DIAGNOSIS — G250 Essential tremor: Secondary | ICD-10-CM

## 2017-08-27 ENCOUNTER — Other Ambulatory Visit: Payer: Self-pay | Admitting: *Deleted

## 2017-08-27 MED ORDER — LISINOPRIL 10 MG PO TABS: 10.0000 mg | ORAL_TABLET | Freq: Every day | ORAL | 1 refills | Status: DC

## 2017-08-27 NOTE — Addendum Note (Signed)
Addended by: Juventino Slovak on: 08/27/2017 03:10 PM   Modules accepted: Orders

## 2017-08-27 NOTE — Telephone Encounter (Signed)
[facility] called to request that lisinopril be changed to a ninety day supply. Rx updated.

## 2017-08-28 ENCOUNTER — Ambulatory Visit (INDEPENDENT_AMBULATORY_CARE_PROVIDER_SITE_OTHER): Payer: Medicare Other | Admitting: Neurology

## 2017-08-28 ENCOUNTER — Encounter: Payer: Self-pay | Admitting: Neurology

## 2017-08-28 DIAGNOSIS — G250 Essential tremor: Secondary | ICD-10-CM | POA: Diagnosis not present

## 2017-08-28 MED ORDER — PRIMIDONE 50 MG PO TABS: 100.0000 mg | ORAL_TABLET | Freq: Every day | ORAL | 3 refills | Status: DC

## 2017-08-28 NOTE — Progress Notes (Signed)
Subjective:    Patient ID: [name] is a 72 y.o. female.  HPI     Interim history:   [name] is a 72 year old right-handed woman with an underlying medical history of coronary artery disease, status post stent placement, paroxysmal A. fib, status post ablation, chronic diarrhea, fecal incontinence, borderline diabetes, hypertension, reflux disease, kidney stones, arthritis, status post right total knee arthroplasty in 3/16 and L TKA in 6/17, recurrent UTIs, and obesity, who presents for follow-up consultation of [name] essential tremor. The patient is unaccompanied today. I last saw [name] on 08/27/2016, at which time she reported doing well and felt stable and Mysoline, 2 pills at bedtime. She denied any side effects and I suggested a one-year follow-up routinely.  Today, 08/28/2017: She reports doing well, Mysoline continues to help him symptomatically controlled [name] tremors. She denies any side effects. Feels stable. She had follow-up with [name] cardiologist in September 2018. She has an appointment pending with [name] PCP. She had blood work in October 2018 which indicated slight increase in creatinine. BUN was okay. She indicates that she does not always drink enough water, she tries to drink about 3-4 bottles per day but sometimes only gets two in.   The patient's allergies, current medications, family history, past medical history, past social history, past surgical history and problem list were reviewed and updated as appropriate.    Previously (copied from previous notes for reference):    I saw [name] on 02/21/2016, at which time she reported doing better with respect to [name] tremors with Mysoline. She denied any side effects. She did have left total knee replacement surgery in the interim on 12/20/2015. She had some residual knee pain. She was in outpatient physical therapy. I suggested she continue with generic Mysoline 100 mg each night.   I first met [name] on 10/06/2015 at the request of  [name] primary care provider, at which time she reported a long-standing history of head tremors, possibly since teenage years. [name] history and physical exam were in keeping with essential tremor. I suggested a cautious trial of Mysoline with titration.   10/06/2015: She reports a long-standing history of head tremors and to a much lesser degree in [name] hands. She recalls that she had tremors when she was 46 or even 72 years old. She has a strong family history of tremors in both parents, younger sister who is much more affected by [name] tremor and both younger brothers, one had to retire early cousin of his tremor. [name] tremor does not bother [name] very much but she notices it from time to time, especially when she is fatigued. 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 Sometimes with [name] earrings she needs help to put them in. Otherwise, she has no significant difficulty with feeding or dressing, self hygiene or ADLs otherwise. She is married and lives with [name] husband. He has not mentioned any voice tremor to [name]. She does note [name] head tremor. She has 2 grown children. She drinks alcohol in the form of wine, usually once a month or so and has not noticed any improvement in [name] tremor or did not pay enough attention to it. She drinks coffee 1 cup a day, she quit smoking in 1990. She tries to stay active physically. She has not noticed much in the way of balance problems. She had left knee arthroscopic surgery in November 2016 and right knee replacement surgery in March 2016. She still has some knee discomfort and  is currently in home health physical therapy.   [name] Past Medical History Is Significant For: Past Medical History:  Diagnosis Date  . A-fib ([place])   . Arthritis    osteoarthritis  . Borderline diabetes   . Chronic diarrhea   . Coronary artery disease    a.  s/p Xience DES to RCA 04/2009;   b. TEE 2/12: EF 40%, Large PFO;  c.  Lexiscan Myoview 05/2012: EF 69%, no ischemia. LHC  (05/2012):  Ostial diagonal 30-40%, proximal mid ramus intermedius 40-50%, RCA stent patent with 40-50% after stent, then 40%, distal RCA 40-50%, EF 55-65%. Medical therapy continued.;  d.  Carlton Adam Myoview (06/2013):  No ischemia, EF 83%, normal study  . Dysrhythmia   . Fecal incontinence   . GERD (gastroesophageal reflux disease)    occasional  . History of basal cell carcinoma excision    scalp  . History of hiatal hernia   . History of kidney stones 51 years ago   x1  . History of recurrent UTIs   . Hypertension   . PAF (paroxysmal atrial fibrillation) ([place])   . S/P ablation of atrial fibrillation   . S/P drug eluting coronary stent placement   . Tremor   . Wears glasses     [name] Past Surgical History Is Significant For: Past Surgical History:  Procedure Laterality Date  . ANAL RECTAL MANOMETRY N/A 01/11/2014   Procedure: ANAL RECTAL MANOMETRY;  Surgeon: Leighton Ruff, MD;  [facility];  Service: Endoscopy;  Laterality: N/A;  . APPENDECTOMY  1973  . BACK SURGERY     laminectomy times 2; 2000 and 2001  . BUNIONECTOMY Bilateral 2013  . CARDIAC CATHETERIZATION  10-24-2010   dr allred   singl-vessel CAD with patent stent mRCA/  moderate disease mRCA beyond stent segment/  normal lvsf  . CARDIAC ELECTROPHYSIOLOGY MAPPING AND ABLATION  08-15-2010  dr allred  . CARDIOVASCULAR STRESS TEST  06-15-2013  dr allred   normal perfusion study/  no ischemia/  ef 83%  . CARPAL TUNNEL RELEASE Right 25 years ago  . CHOLECYSTECTOMY OPEN  1973   W/  APPENDECTOMY  . CORONARY ANGIOPLASTY WITH STENT PLACEMENT  04-04-2009  dr Darnell Level brodie   PCI and DES x1 to  mRCA/  mLAD 40%, pCX 30%, pRCA 30%/  normal LVF  . FLEXIBLE SIGMOIDOSCOPY N/A 07/07/2013   Procedure: FLEXIBLE SIGMOIDOSCOPY;  Surgeon: Winfield Cunas., MD;  Location: [facility];  Service: Endoscopy;  Laterality: N/A;  unprepped  . LEFT HEART CATHETERIZATION WITH CORONARY ANGIOGRAM N/A 05/09/2012   Procedure: LEFT HEART  CATHETERIZATION WITH CORONARY ANGIOGRAM;  Surgeon: Hillary Bow, MD;  Location: [facility] CATH LAB;  Service: Cardiovascular;  Laterality: N/A;  . MOHS SURGERY  ~6 years ago   scalp  . PILONIDAL CYST EXCISION  2009;   2000;   1999  . RECTAL ULTRASOUND N/A 01/11/2014   Procedure: RECTAL ULTRASOUND;  Surgeon: Leighton Ruff, MD;  [facility]: [facility];  Service: Endoscopy;  Laterality: N/A;  . REFRACTIVE SURGERY Bilateral 16 years ago  . TOTAL KNEE ARTHROPLASTY Right 09/07/2014   Procedure: RIGHT TOTAL KNEE ARTHROPLASTY;  Surgeon: Paralee Cancel, MD;  Location: [facility];  Service: Orthopedics;  Laterality: Right;  . TOTAL KNEE ARTHROPLASTY Left 12/20/2015   Procedure: LEFT TOTAL KNEE ARTHROPLASTY;  Surgeon: Paralee Cancel, MD;  [facility]: [facility];  Service: Orthopedics;  Laterality: Left;  . TRANSTHORACIC ECHOCARDIOGRAM  09-19-2011   grade I diastolic dysfunction/  ef 55-60%/  mild TR  .  VAGINAL HYSTERECTOMY  1975   W/ BILATERAL SALPINOOPHOORECTOMY  . VAGUS NERVE STIMULATOR INSERTION N/A 03/10/2014   Procedure: IMPLANTATION OF SACRAL  NERVE STIMULATOR ;  Surgeon: Leighton Ruff, MD;  Location: [place];  Service: General;  Laterality: N/A;  Sacral. [employer]    [name] Family History Is Significant For: Family History  Problem Relation Age of Onset  . Coronary artery disease Father   . Heart disease Mother   . Heart disease Sister     [name] Social History Is Significant For: Social History   Socioeconomic History  . Marital status: Married    Spouse name: None  . Number of children: 2  . Years of education: HS  . Highest education level: None  Social Needs  . Financial resource strain: None  . Food insecurity - worry: None  . Food insecurity - inability: None  . Transportation needs - medical: None  . Transportation needs - non-medical: None  Occupational History  . Occupation: retired    Fish farm manager: RETIRED  Tobacco Use  . Smoking status: Former Smoker    Packs/day: 0.50     Years: 20.00    Pack years: 10.00    Types: Cigarettes    Last attempt to quit: 07/02/1988    Years since quitting: 29.1  . Smokeless tobacco: Never Used  Substance and Sexual Activity  . Alcohol use: No    Alcohol/week: 0.0 oz  . Drug use: No  . Sexual activity: None  Other Topics Concern  . None  Social History Narrative  . None    [name] Allergies Are:  Allergies  Allergen Reactions  . Cephalexin Nausea Only  :   [name] Current Medications Are:  Outpatient Encounter Medications as of 08/28/2017  Medication Sig  . diphenhydrAMINE (BENADRYL) 25 mg capsule Take 25 mg by mouth at bedtime as needed for sleep.  . hydrochlorothiazide (HYDRODIURIL) 12.5 MG tablet Take 1 tablet (12.5 mg total) by mouth daily.  [name] lisinopril (PRINIVIL,ZESTRIL) 10 MG tablet Take 1 tablet (10 mg total) by mouth daily.  . metoprolol succinate (TOPROL-XL) 25 MG 24 hr tablet Take 1 tablet (25 mg total) by mouth 2 (two) times daily.  . nitroGLYCERIN (NITROSTAT) 0.4 MG SL tablet Place 0.4 mg under the tongue every 5 (five) minutes as needed for chest pain.  . pantoprazole (PROTONIX) 40 MG tablet Take 40 mg by mouth daily.  [name] PRADAXA 150 MG CAPS capsule TAKE 1 CAPSULE EVERY 12 HOURS  . primidone (MYSOLINE) 50 MG tablet TAKE 2 TABLETS AT BEDTIME  . rosuvastatin (CRESTOR) 40 MG tablet TAKE 1 TABLET DAILY  . traZODone (DESYREL) 50 MG tablet Take 150 mg by mouth at bedtime.    No facility-administered encounter medications on file as of 08/28/2017.   :  Review of Systems:  Out of a complete 14 point review of systems, all are reviewed and negative with the exception of these symptoms as listed below:  Review of Systems  Neurological:       Pt presents today to follow up on [name] tremor. Pt reports that [name] tremor is stable and the primidone is going well.    Objective:  Neurological Exam  Physical Exam Physical Examination:   Vitals:   08/28/17 0917  BP: (!) 150/90  Pulse: 64    General Examination: The  patient is a very pleasant 72 y.o. female in no acute distress. She appears well-developed and well groomed.   HEENT: Normocephalic, atraumatic, pupils are equal, round and reactive to  light and accommodation. Extraocular tracking is good without limitation to gaze excursion or nystagmus noted.  she wears corrective eyeglasses. Normal smooth pursuit is noted. Hearing is grossly intact. Face is symmetric with normal facial animation and normal facial sensation. Speech is clear with no dysarthria noted. There is no hypophonia. There is a mild intermittent head and neck tremor,  side to side, no jaw or lip tremor. Oropharynx exam reveals: mild mouth dryness, adequate dental hygiene and mild airway crowding. Mallampati is class II. Tongue protrudes centrally and palate elevates symmetrically.   Chest: Clear to auscultation without wheezing, rhonchi or crackles noted.  Heart: S1+S2+0.   Abdomen: Soft, non-tender and non-distended with normal bowel sounds appreciated on auscultation.  Extremities: There is no pitting edema in the distal lower extremities bilaterally. Pedal pulses are intact.  Skin: Warm and dry without trophic changes noted.  Musculoskeletal: exam reveals no obvious joint deformities, tenderness or joint swelling or erythema. Mild decrease in range of motion in both knees.    Neurologically:  Mental status: The patient is awake, alert and oriented in all 4 spheres. [name] immediate and remote memory, attention, language skills and fund of knowledge are appropriate. There is no evidence of aphasia, agnosia, apraxia or anomia. Speech is clear with normal prosody and enunciation. Thought process is linear. Mood is normal and affect is normal.  Cranial nerves II - XII are as described above under HEENT exam. In addition: shoulder shrug is normal with equal shoulder height noted. Motor exam: Normal bulk, strength and tone is noted. There is no drift, or rebound. She has no significant  hand tremor today. Romberg is negative. Reflexes are 1+ throughout. Fine motor skills and coordination: intact with normal finger taps, normal hand movements, normal rapid alternating patting, normal foot taps and normal foot agility.  Cerebellar testing: No dysmetria or intention tremor. There is no truncal or gait ataxia.  Sensory exam: intact to light touch in the upper and lower extremities.  Gait, station and balance: She stands easily. No veering to one side is noted. No leaning to one side is noted. Posture is age-appropriate and stance is narrow based. Gait shows normal stride length and normal pace. No problems turning are noted. Tandem walk is  slightly challenging, not unusual for age.  Assessment and plan:  In summary, KASIYA BURCK is a very pleasant 72 year old female with an underlying medical history of coronary artery disease, status post stent placement, paroxysmal A. Fib, s/p ablation, history of diarrhea and fecal incontinence, borderline diabetes, hypertension, obesity, status post bilateral knee replacement surgeries in March 2016 and June 2017, respectively, history of kidney stones, reflux disease, recurrent UTIs, who presents for follow-up consultation of [name] essential tremor which has affected [name] head and neck area more than [name] hands. Exam is stable and has improved on low-dose Mysoline, currently 100 mg at night, generic. She is tolerating it, reports no side effects and reports ongoing benefit. [name] exam looks good and stable, slightly improved from when we first met, she has a long-standing history of tremors since even [name] teenage years as well as a family history of tremors. I suggested we continue with Mysoline at the current dose, I entered a 90 day prescription with refills. I suggested a one-year checkup, I answered all [name] questions today and she was in agreement. Of note, she has a history of chronic diarrhea and reports that the Mysoline did not worsen [name] diarrhea.   I spent 20 minutes in total face-to-face  time with the patient, more than 50% of which was spent in counseling and coordination of care, reviewing test results, reviewing medication and discussing or reviewing the diagnosis of ET, its prognosis and treatment options. Pertinent laboratory and imaging test results that were available during this visit with the patient were reviewed by me and considered in my medical decision making (see chart for details).

## 2017-08-28 NOTE — Patient Instructions (Addendum)
Please continue with the Mysoline at the current dose. Please make sure, you have your liver function tested with the next blood work with your primary care provider. Try to hydrate well with water.

## 2018-02-19 ENCOUNTER — Other Ambulatory Visit: Payer: Self-pay | Admitting: Internal Medicine

## 2018-02-19 NOTE — Telephone Encounter (Signed)
Pradaxa 150mg  refill request received; pt is 72 yrs old, wt-84.8kg, Crea-1.26 on 04/03/17, last seen by Dr. Rayann Heman on 03/27/17, CrCl-52.17ml/min; will send in refill to requested pharmacy.

## 2018-03-03 ENCOUNTER — Other Ambulatory Visit: Payer: Self-pay | Admitting: Internal Medicine

## 2018-03-28 ENCOUNTER — Encounter: Payer: Self-pay | Admitting: Internal Medicine

## 2018-03-28 ENCOUNTER — Ambulatory Visit (INDEPENDENT_AMBULATORY_CARE_PROVIDER_SITE_OTHER): Payer: Medicare Other | Admitting: Internal Medicine

## 2018-03-28 VITALS — BP 112/68 | HR 69 | Ht 62.0 in | Wt 185.6 lb

## 2018-03-28 DIAGNOSIS — I10 Essential (primary) hypertension: Secondary | ICD-10-CM | POA: Diagnosis not present

## 2018-03-28 DIAGNOSIS — I480 Paroxysmal atrial fibrillation: Secondary | ICD-10-CM | POA: Diagnosis not present

## 2018-03-28 DIAGNOSIS — Z23 Encounter for immunization: Secondary | ICD-10-CM | POA: Diagnosis not present

## 2018-03-28 NOTE — Patient Instructions (Addendum)

## 2018-03-28 NOTE — Progress Notes (Signed)
PCP: Fransisca Connors, PA-C   Primary EP: Dr Rayann Heman  [name] is a 72 y.o. female who presents today for routine electrophysiology followup.  Since last being seen in our clinic, the patient reports doing very well.  She is enjoying traveling in her trailer with her husband.  She traveled to [place], [place], [place], and several other places.  She is planning a cruise this coming year and is very excited.  No CV issues.  Today, she denies symptoms of palpitations, chest pain, shortness of breath,  lower extremity edema, dizziness, presyncope, or syncope.  The patient is otherwise without complaint today.   Past Medical History:  Diagnosis Date  . A-fib ([place])   . Arthritis    osteoarthritis  . Borderline diabetes   . Chronic diarrhea   . Coronary artery disease    a.  s/p Xience DES to RCA 04/2009;   b. TEE 2/12: EF 40%, Large PFO;  c.  Lexiscan Myoview 05/2012: EF 69%, no ischemia. LHC (05/2012):  Ostial diagonal 30-40%, proximal mid ramus intermedius 40-50%, RCA stent patent with 40-50% after stent, then 40%, distal RCA 40-50%, EF 55-65%. Medical therapy continued.;  d.  Carlton Adam Myoview (06/2013):  No ischemia, EF 83%, normal study  . Dysrhythmia   . Fecal incontinence   . GERD (gastroesophageal reflux disease)    occasional  . History of basal cell carcinoma excision    scalp  . History of hiatal hernia   . History of kidney stones 51 years ago   x1  . History of recurrent UTIs   . Hypertension   . PAF (paroxysmal atrial fibrillation) ([place])   . S/P ablation of atrial fibrillation   . S/P drug eluting coronary stent placement   . Tremor   . Wears glasses    Past Surgical History:  Procedure Laterality Date  . ANAL RECTAL MANOMETRY N/A 01/11/2014   Procedure: ANAL RECTAL MANOMETRY;  Surgeon: Leighton Ruff, MD;  Location: [facility];  Service: Endoscopy;  Laterality: N/A;  . APPENDECTOMY  1973  . BACK SURGERY     laminectomy times 2; 2000 and 2001  . BUNIONECTOMY  Bilateral 2013  . CARDIAC CATHETERIZATION  10-24-2010   dr Aquila Delaughter   singl-vessel CAD with patent stent mRCA/  moderate disease mRCA beyond stent segment/  normal lvsf  . CARDIAC ELECTROPHYSIOLOGY MAPPING AND ABLATION  08-15-2010  dr Imir Brumbach  . CARDIOVASCULAR STRESS TEST  06-15-2013  dr Caelum Federici   normal perfusion study/  no ischemia/  ef 83%  . CARPAL TUNNEL RELEASE Right 25 years ago  . CHOLECYSTECTOMY OPEN  1973   W/  APPENDECTOMY  . CORONARY ANGIOPLASTY WITH STENT PLACEMENT  04-04-2009  dr Darnell Level brodie   PCI and DES x1 to  mRCA/  mLAD 40%, pCX 30%, pRCA 30%/  normal LVF  . FLEXIBLE SIGMOIDOSCOPY N/A 07/07/2013   Procedure: FLEXIBLE SIGMOIDOSCOPY;  Surgeon: Winfield Cunas., MD;  Location: [facility];  Service: Endoscopy;  Laterality: N/A;  unprepped  . LEFT HEART CATHETERIZATION WITH CORONARY ANGIOGRAM N/A 05/09/2012   Procedure: LEFT HEART CATHETERIZATION WITH CORONARY ANGIOGRAM;  Surgeon: Hillary Bow, MD;  Location: [facility] CATH LAB;  Service: Cardiovascular;  Laterality: N/A;  . MOHS SURGERY  ~6 years ago   scalp  . PILONIDAL CYST EXCISION  2009;   2000;   1999  . RECTAL ULTRASOUND N/A 01/11/2014   Procedure: RECTAL ULTRASOUND;  Surgeon: Leighton Ruff, MD;  Location: [facility];  Service: Endoscopy;  Laterality:  N/A;  . REFRACTIVE SURGERY Bilateral 16 years ago  . TOTAL KNEE ARTHROPLASTY Right 09/07/2014   Procedure: RIGHT TOTAL KNEE ARTHROPLASTY;  Surgeon: Paralee Cancel, MD;  Location: [facility];  Service: Orthopedics;  Laterality: Right;  . TOTAL KNEE ARTHROPLASTY Left 12/20/2015   Procedure: LEFT TOTAL KNEE ARTHROPLASTY;  Surgeon: Paralee Cancel, MD;  Location: [facility];  Service: Orthopedics;  Laterality: Left;  . TRANSTHORACIC ECHOCARDIOGRAM  09-19-2011   grade I diastolic dysfunction/  ef 55-60%/  mild TR  . VAGINAL HYSTERECTOMY  1975   W/ BILATERAL SALPINOOPHOORECTOMY  . VAGUS NERVE STIMULATOR INSERTION N/A 03/10/2014   Procedure: IMPLANTATION OF SACRAL  NERVE STIMULATOR ;  Surgeon:  Leighton Ruff, MD;  Location: [facility];  Service: General;  Laterality: N/A;  Sacral. [employer]    ROS- all systems are reviewed and negatives except as per HPI above  Current Outpatient Medications  Medication Sig Dispense Refill  . diphenhydrAMINE (BENADRYL) 25 mg capsule Take 25 mg by mouth at bedtime as needed for sleep.    . hydrochlorothiazide (HYDRODIURIL) 12.5 MG tablet Take 1 tablet (12.5 mg total) by mouth daily. 30 tablet 11  . lisinopril (PRINIVIL,ZESTRIL) 10 MG tablet Take 1 tablet (10 mg total) by mouth daily. 90 tablet 1  . nitroGLYCERIN (NITROSTAT) 0.4 MG SL tablet Place 0.4 mg under the tongue every 5 (five) minutes as needed for chest pain.    . pantoprazole (PROTONIX) 40 MG tablet Take 40 mg by mouth daily.    [name] PRADAXA 150 MG CAPS capsule TAKE 1 CAPSULE EVERY 12 HOURS 180 capsule 1  . primidone (MYSOLINE) 50 MG tablet Take 2 tablets (100 mg total) by mouth at bedtime. 180 tablet 3  . rosuvastatin (CRESTOR) 40 MG tablet TAKE 1 TABLET DAILY 30 tablet 0  . TOPROL XL 25 MG 24 hr tablet TAKE 1 TABLET TWICE A DAY 180 tablet 0  . traZODone (DESYREL) 50 MG tablet Take 150 mg by mouth at bedtime.      No current facility-administered medications for this visit.     Physical Exam: Vitals:   03/28/18 1612  BP: 112/68  Pulse: 69  SpO2: 98%  Weight: 185 lb 9.6 oz (84.2 kg)  Height: 5\' 2"  (1.575 m)    GEN- The patient is well appearing, alert and oriented x 3 today.   Head- normocephalic, atraumatic Eyes-  Sclera clear, conjunctiva pink Ears- hearing intact Oropharynx- clear Lungs- Clear to ausculation bilaterally, normal work of breathing Heart- Regular rate and rhythm, no murmurs, rubs or gallops, PMI not laterally displaced GI- soft, NT, ND, + BS Extremities- no clubbing, cyanosis, or edema  Echo 04/03/17 reveals EF 60%, mild MR discussed with her again today  Wt Readings from Last 3 Encounters:  03/28/18 185 lb 9.6 oz (84.2 kg)  08/28/17 187 lb  (84.8 kg)  03/27/17 204 lb 9.6 oz (92.8 kg)    EKG tracing ordered today is personally reviewed and shows sinus rhythm, 69 bpm, PR 200 msec, QRS 72 msec, QTc 454 msec  Assessment and Plan:  1. Atrial fibrillation Resolved s/p ablation in 2012. chads2vasc score is 5.  Continue long term anticoagulation. PCP follows bmet, cbc  2. CAD No ischemic symptoms No changes  3. Edema Much improved Salt restriction advised  4. HTN Stable No change required today  5. Overweight Body mass index is 33.95 kg/m. Lifestyle modification encoruaged  6. HL Stable No change required today  Return in a year to see EP PA  Jeneen Rinks  Elliemae Braman MD, [facility] 03/28/2018 4:32 PM

## 2018-04-28 ENCOUNTER — Ambulatory Visit: Payer: Self-pay | Admitting: General Surgery

## 2018-04-29 ENCOUNTER — Other Ambulatory Visit: Payer: Self-pay | Admitting: Internal Medicine

## 2018-04-30 ENCOUNTER — Telehealth: Payer: Self-pay | Admitting: *Deleted

## 2018-04-30 NOTE — Telephone Encounter (Signed)
   [facility] Pre-operative Risk Assessment    Request for surgical clearance:  1. What type of surgery is being performed? REMOVAL OF SACRAL NERVE STIMULATOR   2. When is this surgery scheduled?  TBD   3. What type of clearance is required (medical clearance vs. Pharmacy clearance to hold med vs. Both)?  BOTH  4. Are there any medications that need to be held prior to surgery and how long? PRADAXA   5. Practice name and name of physician performing surgery?  [facility] DR. Leighton Ruff   6. What is your office phone number [phone]    7.   What is your office fax number [phone]  8.   Anesthesia type (None, local, MAC, general) ?  MAC ANESTHESIA   Jeanann Lewandowsky 04/30/2018, 9:43 AM  _________________________________________________________________   (provider comments below)

## 2018-05-01 NOTE — Telephone Encounter (Signed)
   Primary Cardiologist: Thompson Grayer, MD  Chart reviewed as part of pre-operative protocol coverage. Given past medical history and time since last visit, based on ACC/AHA guidelines, KANDY TOWERY would be at acceptable risk for the planned procedure without further cardiovascular testing.   Pharmacy to review anticoagulation.   [place], [place] 05/01/2018, 1:33 PM

## 2018-05-02 NOTE — Telephone Encounter (Signed)
Patient with diagnosis of atrial fibrillation on Pradaxa for anticoagulation.    Procedure: removal of sacral nerve stimulator Date of procedure: TBD  CHADS2-VASc score of  5 (CHF, HTN, AGE, , CAD, , female)  CrCl 59.1  Platelet count 203  Per office protocol, patient can hold Pradaxa for 3 days prior to procedure.

## 2018-05-21 DIAGNOSIS — M5416 Radiculopathy, lumbar region: Secondary | ICD-10-CM | POA: Insufficient documentation

## 2018-05-27 ENCOUNTER — Other Ambulatory Visit: Payer: Self-pay

## 2018-05-27 ENCOUNTER — Encounter (HOSPITAL_BASED_OUTPATIENT_CLINIC_OR_DEPARTMENT_OTHER): Payer: Self-pay | Admitting: *Deleted

## 2018-05-27 NOTE — Progress Notes (Signed)
Spoke w/ pt via phone for pre-op interview.  Npo after mn.  Arrive at [employer].  Needs istat, pt/inr and ptt (pt last day pradaxa Sunday 06-01-2018).  Current ekg in chart and epic.  Cardiology telephone clearance dated 04-30-2018 and lov note dated 03-28-2018 in chart and epic.  Will take crestor, toprol, and protonix am dos w/ sips of water.

## 2018-06-01 ENCOUNTER — Other Ambulatory Visit: Payer: Self-pay | Admitting: Internal Medicine

## 2018-06-03 NOTE — Anesthesia Preprocedure Evaluation (Addendum)
Anesthesia Evaluation  Patient identified by MRN, date of birth, ID band Patient awake    Reviewed: Allergy & Precautions, NPO status , Patient's Chart, lab work & pertinent test results  Airway Mallampati: III  TM Distance: >3 FB Neck ROM: Full    Dental  (+) Edentulous Upper, Partial Lower   Pulmonary neg pulmonary ROS, former smoker,    breath sounds clear to auscultation       Cardiovascular hypertension, Pt. on medications and Pt. on home beta blockers + CAD, + Cardiac Stents (2010) and +CHF  + dysrhythmias (s/p ablation) Atrial Fibrillation  Rhythm:Regular Rate:Normal  TTE 2018 - Left ventricle: Systolic function was normal. The estimated   ejection fraction was in the range of 60% to 65%. - Aortic valve: Mildly thickened, mildly calcified leaflets. - Mitral valve: Mild prolapse, involving the anterior leaflet.   There was mild regurgitation  Stress Test 2014 normal   Neuro/Psych negative neurological ROS  negative psych ROS   GI/Hepatic hiatal hernia, GERD  Medicated,  Endo/Other    Renal/GU      Musculoskeletal  (+) Arthritis ,   Abdominal   Peds  Hematology negative hematology ROS (+)   Anesthesia Other Findings On pradaxa, last dose Saturday  Reproductive/Obstetrics                            Anesthesia Physical Anesthesia Plan  ASA: III  Anesthesia Plan: MAC   Post-op Pain Management:    Induction: Intravenous  PONV Risk Score and Plan: 2 and Propofol infusion, Treatment may vary due to age or medical condition and Ondansetron  Airway Management Planned: Natural Airway and Simple Face Mask  Additional Equipment:   Intra-op Plan:   Post-operative Plan:   Informed Consent: I have reviewed the patients History and Physical, chart, labs and discussed the procedure including the risks, benefits and alternatives for the proposed anesthesia with the patient or  authorized representative who has indicated his/her understanding and acceptance.   Dental advisory given  Plan Discussed with: CRNA  Anesthesia Plan Comments:         Anesthesia Quick Evaluation

## 2018-06-04 ENCOUNTER — Ambulatory Visit (HOSPITAL_BASED_OUTPATIENT_CLINIC_OR_DEPARTMENT_OTHER)
Admission: RE | Admit: 2018-06-04 | Discharge: 2018-06-04 | Disposition: A | Discharge status: Home / Self Care | Payer: Medicare Other | Source: Ambulatory Visit | Attending: General Surgery | Admitting: General Surgery

## 2018-06-04 ENCOUNTER — Encounter (HOSPITAL_BASED_OUTPATIENT_CLINIC_OR_DEPARTMENT_OTHER): Payer: Self-pay | Admitting: *Deleted

## 2018-06-04 ENCOUNTER — Ambulatory Visit (HOSPITAL_BASED_OUTPATIENT_CLINIC_OR_DEPARTMENT_OTHER): Payer: Medicare Other | Admitting: Anesthesiology

## 2018-06-04 ENCOUNTER — Encounter (HOSPITAL_BASED_OUTPATIENT_CLINIC_OR_DEPARTMENT_OTHER)
Admission: RE | Disposition: A | Discharge status: Home / Self Care | Payer: Self-pay | Source: Ambulatory Visit | Attending: General Surgery

## 2018-06-04 DIAGNOSIS — Z462 Encounter for fitting and adjustment of other devices related to nervous system and special senses: Secondary | ICD-10-CM | POA: Diagnosis present

## 2018-06-04 DIAGNOSIS — Z85828 Personal history of other malignant neoplasm of skin: Secondary | ICD-10-CM | POA: Insufficient documentation

## 2018-06-04 DIAGNOSIS — Z955 Presence of coronary angioplasty implant and graft: Secondary | ICD-10-CM | POA: Insufficient documentation

## 2018-06-04 DIAGNOSIS — Z87891 Personal history of nicotine dependence: Secondary | ICD-10-CM | POA: Diagnosis not present

## 2018-06-04 DIAGNOSIS — I341 Nonrheumatic mitral (valve) prolapse: Secondary | ICD-10-CM | POA: Insufficient documentation

## 2018-06-04 DIAGNOSIS — M1990 Unspecified osteoarthritis, unspecified site: Secondary | ICD-10-CM | POA: Insufficient documentation

## 2018-06-04 DIAGNOSIS — Z7901 Long term (current) use of anticoagulants: Secondary | ICD-10-CM | POA: Insufficient documentation

## 2018-06-04 DIAGNOSIS — Z79899 Other long term (current) drug therapy: Secondary | ICD-10-CM | POA: Insufficient documentation

## 2018-06-04 DIAGNOSIS — I480 Paroxysmal atrial fibrillation: Secondary | ICD-10-CM | POA: Insufficient documentation

## 2018-06-04 DIAGNOSIS — K219 Gastro-esophageal reflux disease without esophagitis: Secondary | ICD-10-CM | POA: Diagnosis not present

## 2018-06-04 DIAGNOSIS — I10 Essential (primary) hypertension: Secondary | ICD-10-CM | POA: Insufficient documentation

## 2018-06-04 DIAGNOSIS — I2510 Atherosclerotic heart disease of native coronary artery without angina pectoris: Secondary | ICD-10-CM | POA: Diagnosis not present

## 2018-06-04 HISTORY — DX: Long term (current) use of anticoagulants: Z79.01

## 2018-06-04 HISTORY — DX: Impaired fasting glucose: R73.01

## 2018-06-04 HISTORY — DX: Unspecified osteoarthritis, unspecified site: M19.90

## 2018-06-04 HISTORY — DX: Complete loss of teeth, unspecified cause, unspecified class: Z97.2

## 2018-06-04 HISTORY — DX: Nonrheumatic mitral (valve) prolapse: I34.1

## 2018-06-04 HISTORY — DX: Complete loss of teeth, unspecified cause, unspecified class: K08.109

## 2018-06-04 LAB — PROTIME-INR
INR: 0.97
Prothrombin Time: 12.8 seconds (ref 11.4–15.2)

## 2018-06-04 LAB — APTT: aPTT: 26 seconds (ref 24–36)

## 2018-06-04 LAB — POCT I-STAT 4, (NA,K, GLUC, HGB,HCT)
Glucose, Bld: 91 mg/dL (ref 70–99)
HCT: 36 % (ref 36.0–46.0)
HEMOGLOBIN: 12.2 g/dL (ref 12.0–15.0)
Potassium: 3.3 mmol/L — ABNORMAL LOW (ref 3.5–5.1)
SODIUM: 142 mmol/L (ref 135–145)

## 2018-06-04 SURGERY — INSERTION, NEUROSTIMULATOR, SACRAL
Anesthesia: Monitor Anesthesia Care | Site: Back

## 2018-06-04 MED ORDER — ACETAMINOPHEN 500 MG PO TABS
1000.0000 mg | ORAL_TABLET | ORAL | Status: AC
  Administered 2018-06-04: 1000 mg via ORAL
  Filled 2018-06-04: qty 2

## 2018-06-04 MED ORDER — ACETAMINOPHEN 500 MG PO TABS
ORAL_TABLET | ORAL | Status: AC
  Filled 2018-06-04: qty 2

## 2018-06-04 MED ORDER — PROPOFOL 10 MG/ML IV BOLUS
INTRAVENOUS | Status: AC
  Filled 2018-06-04: qty 20

## 2018-06-04 MED ORDER — CLINDAMYCIN PHOSPHATE 900 MG/50ML IV SOLN
900.0000 mg | INTRAVENOUS | Status: AC
  Administered 2018-06-04: 900 mg via INTRAVENOUS
  Filled 2018-06-04: qty 50

## 2018-06-04 MED ORDER — ACETAMINOPHEN 325 MG PO TABS
650.0000 mg | ORAL_TABLET | ORAL | Status: DC | PRN
  Filled 2018-06-04: qty 2

## 2018-06-04 MED ORDER — SODIUM CHLORIDE 0.9% FLUSH
3.0000 mL | INTRAVENOUS | Status: DC | PRN
  Filled 2018-06-04: qty 3

## 2018-06-04 MED ORDER — SODIUM CHLORIDE 0.9% FLUSH
3.0000 mL | Freq: Two times a day (BID) | INTRAVENOUS | Status: DC
  Filled 2018-06-04: qty 3

## 2018-06-04 MED ORDER — LIDOCAINE 2% (20 MG/ML) 5 ML SYRINGE
INTRAMUSCULAR | Status: AC
  Filled 2018-06-04: qty 5

## 2018-06-04 MED ORDER — CLINDAMYCIN PHOSPHATE 900 MG/50ML IV SOLN
INTRAVENOUS | Status: AC
  Filled 2018-06-04: qty 50

## 2018-06-04 MED ORDER — LACTATED RINGERS IV SOLN
INTRAVENOUS | Status: DC
  Administered 2018-06-04: 08:00:00 via INTRAVENOUS
  Filled 2018-06-04: qty 1000

## 2018-06-04 MED ORDER — FENTANYL CITRATE (PF) 100 MCG/2ML IJ SOLN
25.0000 ug | INTRAMUSCULAR | Status: DC | PRN
  Filled 2018-06-04: qty 1

## 2018-06-04 MED ORDER — FENTANYL CITRATE (PF) 100 MCG/2ML IJ SOLN
INTRAMUSCULAR | Status: DC | PRN
  Administered 2018-06-04: 50 ug via INTRAVENOUS

## 2018-06-04 MED ORDER — SODIUM CHLORIDE 0.9 % IV SOLN
250.0000 mL | INTRAVENOUS | Status: DC | PRN
  Filled 2018-06-04: qty 250

## 2018-06-04 MED ORDER — PROPOFOL 500 MG/50ML IV EMUL
INTRAVENOUS | Status: DC | PRN
  Administered 2018-06-04: 100 ug/kg/min via INTRAVENOUS

## 2018-06-04 MED ORDER — FENTANYL CITRATE (PF) 100 MCG/2ML IJ SOLN
INTRAMUSCULAR | Status: AC
  Filled 2018-06-04: qty 2

## 2018-06-04 MED ORDER — LIDOCAINE 2% (20 MG/ML) 5 ML SYRINGE
INTRAMUSCULAR | Status: DC | PRN
  Administered 2018-06-04: 50 mg via INTRAVENOUS

## 2018-06-04 MED ORDER — ACETAMINOPHEN 650 MG RE SUPP
650.0000 mg | RECTAL | Status: DC | PRN
  Filled 2018-06-04: qty 1

## 2018-06-04 MED ORDER — PROPOFOL 500 MG/50ML IV EMUL
INTRAVENOUS | Status: AC
  Filled 2018-06-04: qty 50

## 2018-06-04 MED ORDER — BUPIVACAINE-EPINEPHRINE 0.5% -1:200000 IJ SOLN
INTRAMUSCULAR | Status: DC | PRN
  Administered 2018-06-04: 20 mL

## 2018-06-04 SURGICAL SUPPLY — 48 items
BAG URINE LEG 500ML (DRAIN) IMPLANT
BLADE HEX COATED 2.75 (ELECTRODE) ×3 IMPLANT
BLADE SURG 15 STRL LF DISP TIS (BLADE) ×1 IMPLANT
BLADE SURG 15 STRL SS (BLADE) ×2
CABLE TEST STIMULATION (UROLOGICAL SUPPLIES) IMPLANT
CABLE TWIST LOCK 25CM (UROLOGICAL SUPPLIES) IMPLANT
CHLORAPREP W/TINT 26ML (MISCELLANEOUS) ×3 IMPLANT
CLOSURE WOUND 1/2 X4 (GAUZE/BANDAGES/DRESSINGS)
COVER BACK TABLE 60X90IN (DRAPES) ×3 IMPLANT
COVER MAYO STAND STRL (DRAPES) ×3 IMPLANT
COVER PROBE W GEL 5X96 (DRAPES) IMPLANT
COVER WAND RF STERILE (DRAPES) ×3 IMPLANT
DERMABOND ADVANCED (GAUZE/BANDAGES/DRESSINGS) ×2
DERMABOND ADVANCED .7 DNX12 (GAUZE/BANDAGES/DRESSINGS) ×1 IMPLANT
DRAPE C-ARM 42X72 X-RAY (DRAPES) IMPLANT
DRAPE INCISE IOBAN 66X45 STRL (DRAPES) ×3 IMPLANT
DRAPE LAPAROSCOPIC ABDOMINAL (DRAPES) ×3 IMPLANT
DRAPE SHEET LG 3/4 BI-LAMINATE (DRAPES) IMPLANT
DRAPE UTILITY XL STRL (DRAPES) ×3 IMPLANT
DRSG TEGADERM 4X4.75 (GAUZE/BANDAGES/DRESSINGS) IMPLANT
ELECT REM PT RETURN 9FT ADLT (ELECTROSURGICAL) ×3
ELECTRODE REM PT RTRN 9FT ADLT (ELECTROSURGICAL) ×1 IMPLANT
GAUZE SPONGE 4X4 12PLY STRL (GAUZE/BANDAGES/DRESSINGS) IMPLANT
GLOVE BIO SURGEON STRL SZ 6.5 (GLOVE) ×2 IMPLANT
GLOVE BIO SURGEONS STRL SZ 6.5 (GLOVE) ×1
GLOVE BIOGEL PI IND STRL 7.0 (GLOVE) ×1 IMPLANT
GLOVE BIOGEL PI INDICATOR 7.0 (GLOVE) ×2
GOWN SPEC L3 XXLG W/TWL (GOWN DISPOSABLE) ×3 IMPLANT
INTRODUCER GUIDE DILATR SHEATH (SET/KITS/TRAYS/PACK) IMPLANT
KIT TURNOVER CYSTO (KITS) ×3 IMPLANT
NEEDLE HYPO 22GX1.5 SAFETY (NEEDLE) ×3 IMPLANT
NEUROSTIMULATOR 1.7X2X.06 (UROLOGICAL SUPPLIES) IMPLANT
PACK BASIN DAY SURGERY FS (CUSTOM PROCEDURE TRAY) ×3 IMPLANT
PAD ARMBOARD 7.5X6 YLW CONV (MISCELLANEOUS) ×3 IMPLANT
PENCIL BUTTON HOLSTER BLD 10FT (ELECTRODE) ×3 IMPLANT
PROGRAMMER SMART TH90G01 (UROLOGICAL SUPPLIES) IMPLANT
STRIP CLOSURE SKIN 1/2X4 (GAUZE/BANDAGES/DRESSINGS) IMPLANT
SUT SILK 2 0 TIES 17X18 (SUTURE)
SUT SILK 2-0 18XBRD TIE BLK (SUTURE) IMPLANT
SUT VIC AB 3-0 SH 27 (SUTURE) ×2
SUT VIC AB 3-0 SH 27X BRD (SUTURE) ×1 IMPLANT
SUT VICRYL 4-0 PS2 18IN ABS (SUTURE) ×3 IMPLANT
SYR BULB IRRIGATION 50ML (SYRINGE) ×3 IMPLANT
SYR CONTROL 10ML LL (SYRINGE) ×3 IMPLANT
TOWEL OR 17X24 6PK STRL BLUE (TOWEL DISPOSABLE) ×6 IMPLANT
TUBE CONNECTING 12'X1/4 (SUCTIONS)
TUBE CONNECTING 12X1/4 (SUCTIONS) IMPLANT
WATER STERILE IRR 500ML POUR (IV SOLUTION) ×3 IMPLANT

## 2018-06-04 NOTE — Transfer of Care (Signed)
Immediate Anesthesia Transfer of Care Note  Patient: [name]  Procedure(s) Performed: Procedure(s) (LRB): REMOVAL SACRAL NERVE STIMULATOR (N/A)  Patient Location: PACU  Anesthesia Type: MAC  Level of Consciousness: awake, alert , oriented and patient cooperative  Airway & Oxygen Therapy: Patient Spontanous Breathing and Patient connected to face mask oxygen  Post-op Assessment: Report given to PACU RN and Post -op Vital signs reviewed and stable  Post vital signs: Reviewed and stable  Complications: No apparent anesthesia complications  Last Vitals:  Vitals Value Taken Time  BP    Temp    Pulse 59 06/04/2018  8:59 AM  Resp 9 06/04/2018  8:59 AM  SpO2 93 % 06/04/2018  8:59 AM  Vitals shown include unvalidated device data.  Last Pain:  Vitals:   06/04/18 0629  TempSrc: Oral

## 2018-06-04 NOTE — Discharge Instructions (Addendum)
GENERAL SURGERY: POST OP INSTRUCTIONS  1. DIET: Follow a light bland diet the first 24 hours after arrival home, such as soup, liquids, crackers, etc.  Be sure to include lots of fluids daily.  Avoid fast food or heavy meals as your are more likely to get nauseated.   2. Take your usually prescribed home medications unless otherwise directed. 3. PAIN CONTROL: a. Pain is best controlled by a usual combination of three different methods TOGETHER: i. Ice/Heat ii. Over the counter pain medication b. Most patients will experience some swelling and bruising around the incisions.  Ice packs or heating pads (30-60 minutes up to 6 times a day) will help. Use ice for the first few days to help decrease swelling and bruising, then switch to heat to help relax tight/sore spots and speed recovery.  Some people prefer to use ice alone, heat alone, alternating between ice & heat.  Experiment to what works for you.  Swelling and bruising can take several weeks to resolve.   c. It is helpful to take an over-the-counter pain medication regularly for the first few weeks.  Choose one of the following that works best for you: i. Naproxen (Aleve, etc)  Two 220mg  tabs twice a day ii. Ibuprofen (Advil, etc) Three 200mg  tabs four times a day (every meal & bedtime)  4. Wash / shower every day.  You may shower over the dressings as they are waterproof.  Continue to shower over incision(s) after the dressing is off. 5. You have skin glue covering your incisions.  It will wear off in 7-10 days.  6. ACTIVITIES as tolerated:   a. You may resume regular (light) daily activities beginning the next day--such as daily self-care, walking, climbing stairs--gradually increasing activities as tolerated.  If you can walk 30 minutes without difficulty, it is safe to try more intense activity such as jogging, treadmill, bicycling, low-impact aerobics, swimming, etc. b. Save the most intensive and strenuous activity for last such as  sit-ups, heavy lifting, contact sports, etc  Refrain from any heavy lifting or straining until you are off narcotics for pain control.   c. DO NOT PUSH THROUGH PAIN.  Let pain be your guide: If it hurts to do something, don't do it.  Pain is your body warning you to avoid that activity for another week until the pain goes down. d. You may drive when you are no longer taking prescription pain medication, you can comfortably wear a seatbelt, and you can safely maneuver your car and apply brakes. e. Dennis Bast may have sexual intercourse when it is comfortable.  7. FOLLOW UP in our office a. Please call [facility] at [phone] to set up an appointment to see your surgeon in the office for a follow-up appointment approximately 2-3 weeks after your surgery. b. Make sure that you call for this appointment the day you arrive home to insure a convenient appointment time. 9. IF YOU HAVE DISABILITY OR FAMILY LEAVE FORMS, BRING THEM TO THE OFFICE FOR PROCESSING.  DO NOT GIVE THEM TO YOUR DOCTOR.   WHEN TO CALL us [phone]: 1. Poor pain control 2. Reactions / problems with new medications (rash/itching, nausea, etc)  3. Fever over 101.5 F (38.5 C) 4. Worsening swelling or bruising 5. Continued bleeding from incision. 6. Increased pain, redness, or drainage from the incision   The clinic staff is available to answer your questions during regular business hours (8:30am-5pm).  Please dont hesitate to call and ask to speak to one  of our nurses for clinical concerns.   If you have a medical emergency, go to the nearest emergency room or call 911.  A surgeon from [facility] is always on call at the [facility], [place], [place], [place], [place]  [id number] ? MAIN: [phone] ? TOLL FREE: [phone] ?  FAX [phone] [email or web address]   Post Anesthesia Home Care Instructions  Activity: Get plenty of rest for the remainder of  the day. A responsible individual must stay with you for 24 hours following the procedure.  For the next 24 hours, DO NOT: -Drive a car -Paediatric nurse -Drink alcoholic beverages -Take any medication unless instructed by your physician -Make any legal decisions or sign important papers.  Meals: Start with liquid foods such as gelatin or soup. Progress to regular foods as tolerated. Avoid greasy, spicy, heavy foods. If nausea and/or vomiting occur, drink only clear liquids until the nausea and/or vomiting subsides. Call your physician if vomiting continues.  Special Instructions/Symptoms: Your throat may feel dry or sore from the anesthesia or the breathing tube placed in your throat during surgery. If this causes discomfort, gargle with warm salt water. The discomfort should disappear within 24 hours.

## 2018-06-04 NOTE — Anesthesia Postprocedure Evaluation (Signed)
Anesthesia Post Note  Patient: [name]  Procedure(s) Performed: REMOVAL SACRAL NERVE STIMULATOR (N/A Back)     Patient location during evaluation: PACU Anesthesia Type: MAC Level of consciousness: awake and alert Pain management: pain level controlled Vital Signs Assessment: post-procedure vital signs reviewed and stable Respiratory status: spontaneous breathing, nonlabored ventilation, respiratory function stable and patient connected to nasal cannula oxygen Cardiovascular status: stable and blood pressure returned to baseline Postop Assessment: no apparent nausea or vomiting Anesthetic complications: no    Last Vitals:  Vitals:   06/04/18 0920 06/04/18 1000  BP:  (!) 148/73  Pulse: (!) 59 60  Resp:  17  Temp:  36.7 C  SpO2: 100% 100%    Last Pain:  Vitals:   06/04/18 1000  TempSrc:   PainSc: 0-No pain                 [name] L Joice Nazario

## 2018-06-04 NOTE — Op Note (Signed)
06/04/2018  10:03 AM  PATIENT:  [name]  72 y.o. female  Patient Care Team: Fransisca Connors, PA-C as PCP - General (Family Medicine) Thompson Grayer, MD as PCP - Cardiology (Cardiology)  PRE-OPERATIVE DIAGNOSIS:  device not needed  POST-OPERATIVE DIAGNOSIS:  device not needed  PROCEDURE:  REMOVAL SACRAL NERVE STIMULATOR   Surgeon(s): Leighton Ruff, MD  ASSISTANT: none   ANESTHESIA:   MAC  EBL:76m  Total I/O In: 1020 [P.O.:220; I.V.:700; IV Piggyback:100] Out: 105 [Urine:100; Blood:5]  DRAINS: none   SPECIMEN:  No Specimen  DISPOSITION OF SPECIMEN:  N/A  COUNTS:  YES  PLAN OF CARE: Discharge to home after PACU  PATIENT DISPOSITION:  PACU - hemodynamically stable.  INDICATION: 72year old female who presents to the hospital today for removal of a sacral nerve stimulator implant.  She is not using this currently and she would like to complete an MRI for evaluation of back pain.   OR FINDINGS: Sacral nerve stimulator in place  DESCRIPTION: the patient was identified in the preoperative holding area and taken to the OR where they were laid prone on the operating room table.  MAC anesthesia was induced without difficulty. SCDs were also noted to be in place prior to the initiation of anesthesia.  The patient was then prepped and draped in the usual sterile fashion.   A surgical timeout was performed indicating the correct patient, procedure, positioning and need for preoperative antibiotics.   I began by making an incision over the patient's previous scar in the right lateral gluteus.  Dissection was carried down through subcutaneous tissues using electrocautery until the battery pack was identified.  This was removed from the pocket.  I then made a separate incision over the midline and identified the wire underneath the skin.  This was hooked with a right angle clamp and brought out of the skin.  This was then pulled out of the sacrum intact.  The entire device was  removed from the skin.  The midline incision was closed with a subcuticular 3-0 Vicryl suture.  The lateral site was closed using a running 3-0 Vicryl suture to close the pocket and a running 4-0 subcuticular Vicryl suture to close the skin.  Dermabond was placed on either wound.  The patient was then awakened from anesthesia and sent to the postanesthesia care unit in stable condition.  All counts were correct per operating room staff.

## 2018-06-04 NOTE — H&P (Signed)
[name] is an 72 y.o. female.   Chief Complaint: SNS in place HPI: Pt no longer needs her stimulator.  It has been off for over 6 months.  She would like to get an MRI to evaluate some spine problems.  Past Medical History:  Diagnosis Date  . Anticoagulated    Pradaxa  . Coronary artery disease cardiologist-- dr Rayann Heman   a.  s/p Xience DES to RCA 04/2009;   b. TEE 2/12: EF 40%, Large PFO;  c.  Lexiscan Myoview 05/2012: EF 69%, no ischemia. LHC (05/2012):  Ostial diagonal 30-40%, proximal mid ramus intermedius 40-50%, RCA stent patent with 40-50% after stent, then 40%, distal RCA 40-50%, EF 55-65%. Medical therapy continued.;  d.  Carlton Adam Myoview (06/2013):  No ischemia, EF 83%, normal study  . Full dentures   . GERD (gastroesophageal reflux disease)   . History of basal cell carcinoma excision    scalp  . History of hiatal hernia   . History of kidney stones 51 years ago   x1  . History of recurrent UTIs   . Hypertension   . IFG (impaired fasting glucose)   . MVP (mitral valve prolapse)    mild per last echo 04-03-2017 in epic  . OA (osteoarthritis)    knees , fingers  . PAF (paroxysmal atrial fibrillation) [facility])    cardiologist-- dr Rayann Heman  . S/P ablation of atrial fibrillation 08/15/2010  . S/P drug eluting coronary stent placement 04/04/2009  . Tremor   . Wears glasses     Past Surgical History:  Procedure Laterality Date  . ANAL RECTAL MANOMETRY N/A 01/11/2014   Procedure: ANAL RECTAL MANOMETRY;  Surgeon: Leighton Ruff, MD;  Location: [facility];  Service: Endoscopy;  Laterality: N/A;  . APPENDECTOMY  1973  . BACK SURGERY     laminectomy times 2; 2000 and 2001  . BUNIONECTOMY Bilateral 2013  . CARDIAC CATHETERIZATION  10-24-2010   dr allred   singl-vessel CAD with patent stent mRCA/  moderate disease mRCA beyond stent segment/  normal lvsf  . CARDIAC ELECTROPHYSIOLOGY MAPPING AND ABLATION  08-15-2010  dr allred  . CARDIOVASCULAR STRESS TEST  06-15-2013  dr  allred   normal perfusion study/  no ischemia/  ef 83%  . CARPAL TUNNEL RELEASE Right 25 years ago  . CHOLECYSTECTOMY OPEN  1973   W/  APPENDECTOMY  . CORONARY ANGIOPLASTY WITH STENT PLACEMENT  04-04-2009  dr Darnell Level brodie   PCI and DES x1 to  mRCA/  mLAD 40%, pCX 30%, pRCA 30%/  normal LVF  . FLEXIBLE SIGMOIDOSCOPY N/A 07/07/2013   Procedure: FLEXIBLE SIGMOIDOSCOPY;  Surgeon: Winfield Cunas., MD;  Location: [facility];  Service: Endoscopy;  Laterality: N/A;  unprepped  . LEFT HEART CATHETERIZATION WITH CORONARY ANGIOGRAM N/A 05/09/2012   Procedure: LEFT HEART CATHETERIZATION WITH CORONARY ANGIOGRAM;  Surgeon: Hillary Bow, MD;  Location: [facility] C[facility] LAB;  Service: Cardiovascular;  Laterality: N/A;  . MOHS SURGERY  2009   scalp  . PILONIDAL CYST EXCISION  2009;   2000;   1999  . RECTAL ULTRASOUND N/A 01/11/2014   Procedure: RECTAL ULTRASOUND;  Surgeon: Leighton Ruff, MD;  [facility];  Service: Endoscopy;  Laterality: N/A;  . TOTAL KNEE ARTHROPLASTY Right 09/07/2014   Procedure: RIGHT TOTAL KNEE ARTHROPLASTY;  Surgeon: Paralee Cancel, MD;  Location: [facility];  Service: Orthopedics;  Laterality: Right;  . TOTAL KNEE ARTHROPLASTY Left 12/20/2015   Procedure: LEFT TOTAL KNEE ARTHROPLASTY;  Surgeon: Paralee Cancel, MD;  [facility]: [facility];  Service: Orthopedics;  Laterality: Left;  . TRANSTHORACIC ECHOCARDIOGRAM  04-03-2017   dr allred   Leory Plowman 55-60%/  mild MVP anterior leaflet (valve area 2.1cm^2) with mild regurg. , no stenosis/  trivial TR and PR  . VAGINAL HYSTERECTOMY  1975   W/ BILATERAL SALPINOOPHOORECTOMY  . VAGUS NERVE STIMULATOR INSERTION N/A 03/10/2014   Procedure: IMPLANTATION OF SACRAL  NERVE STIMULATOR ;  Surgeon: Leighton Ruff, MD;  Location: [facility];  Service: General;  Laterality: N/A;  Sacral. [employer]    Family History  Problem Relation Age of Onset  . Coronary artery disease Father   . Heart disease Mother   . Heart disease Sister    Social History:   reports that she quit smoking about 29 years ago. Her smoking use included cigarettes. She has a 10.00 pack-year smoking history. She has never used smokeless tobacco. She reports that she does not drink alcohol or use drugs.  Allergies:  Allergies  Allergen Reactions  . Cephalexin Nausea Only    Medications Prior to Admission  Medication Sig Dispense Refill  . diphenhydrAMINE (BENADRYL) 25 mg capsule Take 25 mg by mouth at bedtime as needed for sleep.    . hydrochlorothiazide (HYDRODIURIL) 12.5 MG tablet Take 1 tablet (12.5 mg total) by mouth daily. (Patient taking differently: Take 12.5 mg by mouth every morning. ) 30 tablet 11  . lisinopril (PRINIVIL,ZESTRIL) 10 MG tablet Take 1 tablet (10 mg total) by mouth daily. (Patient taking differently: Take 10 mg by mouth every morning. ) 90 tablet 1  . pantoprazole (PROTONIX) 40 MG tablet Take 40 mg by mouth every morning.     [name] PRADAXA 150 MG CAPS capsule TAKE 1 CAPSULE EVERY 12 HOURS 180 capsule 1  . primidone (MYSOLINE) 50 MG tablet Take 2 tablets (100 mg total) by mouth at bedtime. 180 tablet 3  . rosuvastatin (CRESTOR) 40 MG tablet Take 1 tablet (40 mg total) by mouth daily. (Patient taking differently: Take 40 mg by mouth every morning. ) 30 tablet 11  . TOPROL XL 25 MG 24 hr tablet TAKE 1 TABLET TWICE A DAY 180 tablet 4  . traZODone (DESYREL) 50 MG tablet Take 150 mg by mouth at bedtime.     . nitroGLYCERIN (NITROSTAT) 0.4 MG SL tablet Place 0.4 mg under the tongue every 5 (five) minutes as needed for chest pain.      Results for orders placed or performed during the hospital encounter of 06/04/18 (from the past 48 hour(s))  PT- INR Day of Surgery     Status: None   Collection Time: 06/04/18  7:05 AM  Result Value Ref Range   Prothrombin Time 12.8 11.4 - 15.2 seconds   INR 0.97     Comment: Performed at [facility], [place] [address] [place], [place] [address]  PTT Day of Surgery     Status: None   Collection Time:  06/04/18  7:05 AM  Result Value Ref Range   aPTT 26 24 - 36 seconds    Comment: Performed at [facility], [place] [address], [place], [place] [address]  I-STAT 4, (NA,K, GLUC, HGB,HCT)     Status: Abnormal   Collection Time: 06/04/18  7:09 AM  Result Value Ref Range   Sodium 142 135 - 145 mmol/L   Potassium 3.3 (L) 3.5 - 5.1 mmol/L   Glucose, Bld 91 70 - 99 mg/dL   HCT 36.0 36.0 - 46.0 %   Hemoglobin  12.2 12.0 - 15.0 g/dL   No results found.  Review of Systems  Constitutional: Negative for chills and fever.  HENT: Negative for hearing loss.   Eyes: Negative for blurred vision.  Respiratory: Negative for cough and shortness of breath.   Cardiovascular: Negative for chest pain and palpitations.  Gastrointestinal: Negative for abdominal pain, nausea and vomiting.  Genitourinary: Negative for dysuria, frequency and urgency.  Neurological: Negative for dizziness and headaches.    Blood pressure (!) 156/78, pulse 65, temperature 98.2 F (36.8 C), temperature source Oral, resp. rate 16, height 5\' 2"  (1.575 m), weight 84.7 kg, SpO2 96 %. Physical Exam  Constitutional: She is oriented to person, place, and time. She appears well-developed and well-nourished.  HENT:  Head: Normocephalic and atraumatic.  Eyes: Pupils are equal, round, and reactive to light. Conjunctivae and EOM are normal.  Neck: Normal range of motion. Neck supple.  Cardiovascular: Normal rate and regular rhythm.  Respiratory: Effort normal. No respiratory distress.  GI: Soft. She exhibits no distension. There is no tenderness. There is no rebound.  Musculoskeletal: Normal range of motion.  Neurological: She is alert and oriented to person, place, and time.  Skin: Skin is warm and dry.     Assessment/Plan 72 y.o. F no longer in need of her SNS.  I have recommended excision.  Risks include bleeding, infection and pain.  All questions answered  Rosario Adie, MD 16/10/5372, 8:24 AM

## 2018-08-18 ENCOUNTER — Other Ambulatory Visit: Payer: Self-pay | Admitting: Internal Medicine

## 2018-09-03 ENCOUNTER — Encounter: Payer: Self-pay | Admitting: Neurology

## 2018-09-03 ENCOUNTER — Other Ambulatory Visit: Payer: Self-pay

## 2018-09-03 ENCOUNTER — Ambulatory Visit (INDEPENDENT_AMBULATORY_CARE_PROVIDER_SITE_OTHER): Payer: Medicare Other | Admitting: Neurology

## 2018-09-03 DIAGNOSIS — G250 Essential tremor: Secondary | ICD-10-CM | POA: Diagnosis not present

## 2018-09-03 MED ORDER — PRIMIDONE 50 MG PO TABS: 100.0000 mg | ORAL_TABLET | Freq: Every day | ORAL | 3 refills | Status: DC

## 2018-09-03 NOTE — Patient Instructions (Signed)
Your exam with regards to tremor is stable, we can continue with the Mysoline 2 pills each evening. Please be mindful that it is sedating and you also take the Trazodone at night.

## 2018-09-03 NOTE — Progress Notes (Signed)
6Subjective:    Patient ID: Renee MCEUEN is a 73 y.o. female.  HPI     Interim history:   Ms. Shannon is a 73year-old right-handed woman with an underlying medical history of coronary artery disease, status post stent placement, paroxysmal A. fib, status post ablation, chronic diarrhea, fecal incontinence, borderline diabetes, hypertension, reflux disease, kidney stones, arthritis, status post right total knee arthroplasty in 3/16 and L TKA in 6/17, recurrent UTIs, and obesity, who presents for follow-up consultation of her essential tremor. The patient is unaccompanied today. and presents for her yearly checkup. I last saw her on 08/28/2017, at which time she reported doing well, she felt that Mysoline continued to help her tremor.  Today, 09/02/2018: She reports doing okay, stable the tremor. She had a sacral nerve stimulator removal in December 2019. Had a subsequent L spine MRI through [facility]. Has regular blood work from PCP, no issues with liver kidney function per her report. She does not have any issues with diarrhea any longer.   The patient's allergies, current medications, family history, past medical history, past social history, past surgical history and problem list were reviewed and updated as appropriate.    Previously (copied from previous notes for reference):   I saw her on 08/27/2016, at which time she reported doing well and felt stable and Mysoline, 2 pills at bedtime. She denied any side effects and I suggested a one-year follow-up routinely.    I saw her on 02/21/2016, at which time she reported doing better with respect to her tremors with Mysoline. She denied any side effects. She did have left total knee replacement surgery in the interim on 12/20/2015. She had some residual knee pain. She was in outpatient physical therapy. I suggested she continue with generic Mysoline 100 mg each night.   I first met her on 10/06/2015 at the request of her primary care  provider, at which time she reported a long-standing history of head tremors, possibly since teenage years. Her history and physical exam were in keeping with essential tremor. I suggested a cautious trial of Mysoline with titration.   10/06/2015: She reports a long-standing history of head tremors and to a much lesser degree in her hands. She recalls that she had tremors when she was 66 or even 73 years old. She has a strong family history of tremors in both parents, younger sister who is much more affected by her tremor and both younger brothers, one had to retire early cousin of his tremor. Her tremor does not bother her very much but she notices it from time to time, especially when she is fatigued. 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 Sometimes with her earrings she needs help to put them in. Otherwise, she has no significant difficulty with feeding or dressing, self hygiene or ADLs otherwise. She is married and lives with her husband. He has not mentioned any voice tremor to her. She does note her head tremor. She has 2 grown children. She drinks alcohol in the form of wine, usually once a month or so and has not noticed any improvement in her tremor or did not pay enough attention to it. She drinks coffee 1 cup a day, she quit smoking in 1990. She tries to stay active physically. She has not noticed much in the way of balance problems. She had left knee arthroscopic surgery in November 2016 and right knee replacement surgery in March 2016. She still has some knee  discomfort and is currently in home health physical therapy.   Her Past Medical History Is Significant For: Past Medical History:  Diagnosis Date  . Anticoagulated    Pradaxa  . Coronary artery disease cardiologist-- dr Rayann Heman   a.  s/p Xience DES to RCA 04/2009;   b. TEE 2/12: EF 40%, Large PFO;  c.  Lexiscan Myoview 05/2012: EF 69%, no ischemia. LHC (05/2012):  Ostial diagonal 30-40%, proximal mid ramus  intermedius 40-50%, RCA stent patent with 40-50% after stent, then 40%, distal RCA 40-50%, EF 55-65%. Medical therapy continued.;  d.  Carlton Adam Myoview (06/2013):  No ischemia, EF 83%, normal study  . Full dentures   . GERD (gastroesophageal reflux disease)   . History of basal cell carcinoma excision    scalp  . History of hiatal hernia   . History of kidney stones 51 years ago   x1  . History of recurrent UTIs   . Hypertension   . IFG (impaired fasting glucose)   . MVP (mitral valve prolapse)    mild per last echo 04-03-2017 in epic  . OA (osteoarthritis)    knees , fingers  . PAF (paroxysmal atrial fibrillation) [facility])    cardiologist-- dr Rayann Heman  . S/P ablation of atrial fibrillation 08/15/2010  . S/P drug eluting coronary stent placement 04/04/2009  . Tremor   . Wears glasses     Her Past Surgical History Is Significant For: Past Surgical History:  Procedure Laterality Date  . ANAL RECTAL MANOMETRY N/A 01/11/2014   Procedure: ANAL RECTAL MANOMETRY;  Surgeon: Leighton Ruff, MD;  Location: [facility];  Service: Endoscopy;  Laterality: N/A;  . APPENDECTOMY  1973  . BACK SURGERY     laminectomy times 2; 2000 and 2001  . BUNIONECTOMY Bilateral 2013  . CARDIAC CATHETERIZATION  10-24-2010   dr allred   singl-vessel CAD with patent stent mRCA/  moderate disease mRCA beyond stent segment/  normal lvsf  . CARDIAC ELECTROPHYSIOLOGY MAPPING AND ABLATION  08-15-2010  dr allred  . CARDIOVASCULAR STRESS TEST  06-15-2013  dr allred   normal perfusion study/  no ischemia/  ef 83%  . CARPAL TUNNEL RELEASE Right 25 years ago  . CHOLECYSTECTOMY OPEN  1973   W/  APPENDECTOMY  . CORONARY ANGIOPLASTY WITH STENT PLACEMENT  04-04-2009  dr Darnell Level brodie   PCI and DES x1 to  mRCA/  mLAD 40%, pCX 30%, pRCA 30%/  normal LVF  . FLEXIBLE SIGMOIDOSCOPY N/A 07/07/2013   Procedure: FLEXIBLE SIGMOIDOSCOPY;  Surgeon: Winfield Cunas., MD;  Location: [facility];  Service: Endoscopy;  Laterality: N/A;   unprepped  . LEFT HEART CATHETERIZATION WITH CORONARY ANGIOGRAM N/A 05/09/2012   Procedure: LEFT HEART CATHETERIZATION WITH CORONARY ANGIOGRAM;  Surgeon: Hillary Bow, MD;  Location: [facility] CATH LAB;  Service: Cardiovascular;  Laterality: N/A;  . MOHS SURGERY  2009   scalp  . PILONIDAL CYST EXCISION  2009;   2000;   1999  . RECTAL ULTRASOUND N/A 01/11/2014   Procedure: RECTAL ULTRASOUND;  Surgeon: Leighton Ruff, MD;  [facility];  Service: Endoscopy;  Laterality: N/A;  . TOTAL KNEE ARTHROPLASTY Right 09/07/2014   Procedure: RIGHT TOTAL KNEE ARTHROPLASTY;  Surgeon: Paralee Cancel, MD;  [facility];  Service: Orthopedics;  Laterality: Right;  . TOTAL KNEE ARTHROPLASTY Left 12/20/2015   Procedure: LEFT TOTAL KNEE ARTHROPLASTY;  Surgeon: Paralee Cancel, MD;  Location: [facility];  Service: Orthopedics;  Laterality: Left;  . TRANSTHORACIC ECHOCARDIOGRAM  04-03-2017   dr  allred   ef 55-60%/  mild MVP anterior leaflet (valve area 2.1cm^2) with mild regurg. , no stenosis/  trivial TR and PR  . VAGINAL HYSTERECTOMY  1975   W/ BILATERAL SALPINOOPHOORECTOMY  . VAGUS NERVE STIMULATOR INSERTION N/A 03/10/2014   Procedure: IMPLANTATION OF SACRAL  NERVE STIMULATOR ;  Surgeon: Leighton Ruff, MD;  Location: [facility];  Service: General;  Laterality: N/A;  Sacral. [employer]    Her Family History Is Significant For: Family History  Problem Relation Age of Onset  . Coronary artery disease Father   . Heart disease Mother   . Heart disease Sister     Her Social History Is Significant For: Social History   Socioeconomic History  . Marital status: Married    Spouse name: Not on file  . Number of children: 2  . Years of education: HS  . Highest education level: Not on file  Occupational History  . Occupation: retired    Fish farm manager: RETIRED  Social Needs  . Financial resource strain: Not on file  . Food insecurity:    Worry: Not on file    Inability: Not on file  . Transportation  needs:    Medical: Not on file    Non-medical: Not on file  Tobacco Use  . Smoking status: Former Smoker    Packs/day: 0.50    Years: 20.00    Pack years: 10.00    Types: Cigarettes    Last attempt to quit: 07/02/1988    Years since quitting: 30.1  . Smokeless tobacco: Never Used  Substance and Sexual Activity  . Alcohol use: No  . Drug use: Never  . Sexual activity: Not on file  Lifestyle  . Physical activity:    Days per week: Not on file    Minutes per session: Not on file  . Stress: Not on file  Relationships  . Social connections:    Talks on phone: Not on file    Gets together: Not on file    Attends religious service: Not on file    Active member of club or organization: Not on file    Attends meetings of clubs or organizations: Not on file    Relationship status: Not on file  Other Topics Concern  . Not on file  Social History Narrative  . Not on file    Her Allergies Are:  Allergies  Allergen Reactions  . Cephalexin Nausea Only  :   Her Current Medications Are:  Outpatient Encounter Medications as of 09/03/2018  Medication Sig  . diphenhydrAMINE (BENADRYL) 25 mg capsule Take 25 mg by mouth at bedtime as needed for sleep.  . hydrochlorothiazide (HYDRODIURIL) 12.5 MG tablet Take 1 tablet (12.5 mg total) by mouth daily. (Patient taking differently: Take 12.5 mg by mouth every morning. )  . lisinopril (PRINIVIL,ZESTRIL) 10 MG tablet Take 1 tablet (10 mg total) by mouth daily. (Patient taking differently: Take 10 mg by mouth every morning. )  . nitroGLYCERIN (NITROSTAT) 0.4 MG SL tablet Place 0.4 mg under the tongue every 5 (five) minutes as needed for chest pain.  . pantoprazole (PROTONIX) 40 MG tablet Take 40 mg by mouth every morning.   [name] PRADAXA 150 MG CAPS capsule TAKE 1 CAPSULE EVERY 12 HOURS  . primidone (MYSOLINE) 50 MG tablet Take 2 tablets (100 mg total) by mouth at bedtime.  . rosuvastatin (CRESTOR) 40 MG tablet Take 1 tablet (40 mg total) by mouth daily.  (Patient taking differently: Take 40 mg by  mouth every morning. )  . TOPROL XL 25 MG 24 hr tablet TAKE 1 TABLET TWICE A DAY  . traZODone (DESYREL) 50 MG tablet Take 150 mg by mouth at bedtime.    No facility-administered encounter medications on file as of 09/03/2018.   :  Review of Systems:  Out of a complete 14 point review of systems, all are reviewed and negative with the exception of these symptoms as listed below: Review of Systems  Neurological:       Pt presents today to get a refill on her primidone. Pt reports that the primidone is working very well.    Objective:  Neurological Exam  Physical Exam Physical Examination:   Vitals:   09/03/18 1020  BP: 130/80  Pulse: 64    General Examination: The patient is a very pleasant 73 y.o. female in no acute distress. She appears well-developed and well-nourished and well groomed.   HEENT:Normocephalic, atraumatic, pupils are equal, round and reactive to lightraocular tracking is good without limitation to gaze excursion or nystagmus noted. She wears corrective eyeglasses. Normal smooth pursuit is noted. Hearing is grossly intact. Face is symmetric with normal facial animation and normal facial sensation. Speech is clear with no dysarthria noted. There is no hypophonia. There isa mild intermittent head and neck tremor, side to side, no jaw or lip tremor.Oropharynx exam reveals: mildmouth dryness, adequatedental hygiene and mildairway crowding. Mallampati is class II. Tongue protrudes centrally and palate elevates symmetrically.   Chest:Clear to auscultation without wheezing, rhonchi or crackles noted.  Heart:S1+S2+0.   Abdomen:Soft, non-tender and non-distended with normal bowel sounds appreciated on auscultation.  Extremities:There isnopitting edema in the distal lower extremities bilaterally. Pedal pulses are intact.  Skin: Warm and dry without trophic changes noted.  Musculoskeletal: exam reveals no obvious  joint deformities, tenderness or joint swelling or erythema.Mild decrease in range of motion in both knees.  Neurologically:  Mental status: The patient is awake, alert and oriented in all 4 spheres.Herimmediate and remote memory, attention, language skills and fund of knowledge are appropriate. There is no evidence of aphasia, agnosia, apraxia or anomia. Speech is clear with normal prosody and enunciation. Thought process is linear. Mood is normaland affect is normal.  Cranial nerves II - XII are as described above under HEENT exam.  Motor exam: Normal bulk, strength and tone is noted. There is no drift,orrebound.She has a slight postural hand tremor, right side more noticeable than left. No significant action tremor. Fine motor skills and coordination: grossly intact. Cerebellar testing: No dysmetria or intention tremor. There is no truncal or gait ataxia.  Sensory exam: intact to light touch in the upper and lower extremities.  Gait, station and balance:Shestands easily. No veering to one side is noted. No leaning to one side is noted. Posture is age-appropriate and stance is narrow based. Gait showsnormalstride length and normalpace. No problems turning are noted.   Assessmentand plan:  In summary,Kestrel L Conradis a very pleasant 87 year oldfemalewith an underlying medical history of coronary artery disease, status post stent placement, paroxysmal A. Fib, s/p ablation, history of diarrhea and fecal incontinence, borderline diabetes, hypertension, obesity, status post bilateral knee replacement surgeries in March 2016 and June 2017, respectively, history of kidney stones, reflux disease, recurrent UTIs, who presents for follow-up consultation of her essential tremor, which has affected her head and neck area more than her hands. Exam is fairly stable, has a slight hand tremor mostly on the right today but overall head tremor is stable. She has done  well on low-dose Mysoline, 100  milligrams at night. She is cautioned about the sedating effects of Mysoline especially since she also takes trazodone 150 mg each night. She reports no side effects and no sedation. Of note, she has a long-standing history of tremors since even her teenage years as well as a family history of tremors. I suggested we continue with Mysoline at the current dose, I renewed her 90 day prescription. I suggested a one-year checkup. See one of our nurse practitioners next time. I answered all her questions today and she was in agreement. I spent 15 minutes in total face-to-face time with the patient, more than 50% of which was spent in counseling and coordination of care, reviewing test results, reviewing medication and discussing or reviewing the diagnosis of ET, its prognosis and treatment options. Pertinent laboratory and imaging test results that were available during this visit with the patient were reviewed by me and considered in my medical decision making (see chart for details).

## 2019-02-14 ENCOUNTER — Other Ambulatory Visit: Payer: Self-pay | Admitting: Internal Medicine

## 2019-02-16 ENCOUNTER — Other Ambulatory Visit: Payer: Self-pay

## 2019-02-16 DIAGNOSIS — I480 Paroxysmal atrial fibrillation: Secondary | ICD-10-CM

## 2019-02-16 NOTE — Telephone Encounter (Signed)
lmom for overdue labs

## 2019-02-17 ENCOUNTER — Other Ambulatory Visit: Payer: Medicare Other

## 2019-02-17 NOTE — Telephone Encounter (Signed)
[facility] called and stated they are faxing the results awaiting fax

## 2019-02-17 NOTE — Telephone Encounter (Signed)
82f 82.6kg Scr 1.13 01/29/19 ccr 73mlmin Lovw/allred 03/28/18

## 2019-04-08 NOTE — Progress Notes (Signed)
Cardiology Office Note Date:  04/10/2019  Patient ID:  Abigaille, Hougen Jun 30, 1946, MRN [id number] PCP:  Fransisca Connors, PA-C  Cardiologist:  Dr. Rayann Heman    Chief Complaint: annual visit  History of Present Illness: DANYELLA BENOIST is a 72 y.o. female with history of CAD (DES to the RCA in 2010), HTN, HLD, DM, and AFib (s/p PVI ablation in 2012).   She comes in today to be seen for Dr. Rayann Heman, last seen by him Sept 2019.  She was doing very well, traveling extensively.  No changes were made, she was encouraged life style modification for obesity, CAD, general health.   She continues to do very well.  She is active in/around the house, cleaning and doing projects.  Denies any CP, palpitations, or SOB.  No exertional intolerances, no DOE.   She denies any symptoms of PND or orthopnea, no near syncope or syncope. She has had labs done twice this year via her primary, he also manages her lipids as well. She denies any bleeding or signs of bleeding on her pradaxa.   Past Medical History:  Diagnosis Date  . Anticoagulated    Pradaxa  . Coronary artery disease cardiologist-- dr Rayann Heman   a.  s/p Xience DES to RCA 04/2009;   b. TEE 2/12: EF 40%, Large PFO;  c.  Lexiscan Myoview 05/2012: EF 69%, no ischemia. LHC (05/2012):  Ostial diagonal 30-40%, proximal mid ramus intermedius 40-50%, RCA stent patent with 40-50% after stent, then 40%, distal RCA 40-50%, EF 55-65%. Medical therapy continued.;  d.  Carlton Adam Myoview (06/2013):  No ischemia, EF 83%, normal study  . Full dentures   . GERD (gastroesophageal reflux disease)   . History of basal cell carcinoma excision    scalp  . History of hiatal hernia   . History of kidney stones 51 years ago   x1  . History of recurrent UTIs   . Hypertension   . IFG (impaired fasting glucose)   . MVP (mitral valve prolapse)    mild per last echo 04-03-2017 in epic  . OA (osteoarthritis)    knees , fingers  . PAF (paroxysmal atrial fibrillation)  [facility])    cardiologist-- dr Rayann Heman  . S/P ablation of atrial fibrillation 08/15/2010  . S/P drug eluting coronary stent placement 04/04/2009  . Tremor   . Wears glasses     Past Surgical History:  Procedure Laterality Date  . ANAL RECTAL MANOMETRY N/A 01/11/2014   Procedure: ANAL RECTAL MANOMETRY;  Surgeon: Leighton Ruff, MD;  [facility];  Service: Endoscopy;  Laterality: N/A;  . APPENDECTOMY  1973  . BACK SURGERY     laminectomy times 2; 2000 and 2001  . BUNIONECTOMY Bilateral 2013  . CARDIAC CATHETERIZATION  10-24-2010   dr allred   singl-vessel CAD with patent stent mRCA/  moderate disease mRCA beyond stent segment/  normal lvsf  . CARDIAC ELECTROPHYSIOLOGY MAPPING AND ABLATION  08-15-2010  dr allred  . CARDIOVASCULAR STRESS TEST  06-15-2013  dr allred   normal perfusion study/  no ischemia/  ef 83%  . CARPAL TUNNEL RELEASE Right 25 years ago  . CHOLECYSTECTOMY OPEN  1973   W/  APPENDECTOMY  . CORONARY ANGIOPLASTY WITH STENT PLACEMENT  04-04-2009  dr Darnell Level brodie   PCI and DES x1 to  mRCA/  mLAD 40%, pCX 30%, pRCA 30%/  normal LVF  . FLEXIBLE SIGMOIDOSCOPY N/A 07/07/2013   Procedure: FLEXIBLE SIGMOIDOSCOPY;  Surgeon: Winfield Cunas., MD;  [facility];  Service: Endoscopy;  Laterality: N/A;  unprepped  . LEFT HEART CATHETERIZATION WITH CORONARY ANGIOGRAM N/A 05/09/2012   Procedure: LEFT HEART CATHETERIZATION WITH CORONARY ANGIOGRAM;  Surgeon: Hillary Bow, MD;  Location: [facility] CATH LAB;  Service: Cardiovascular;  Laterality: N/A;  . MOHS SURGERY  2009   scalp  . PILONIDAL CYST EXCISION  2009;   2000;   1999  . RECTAL ULTRASOUND N/A 01/11/2014   Procedure: RECTAL ULTRASOUND;  Surgeon: Leighton Ruff, MD;  [facility];  Service: Endoscopy;  Laterality: N/A;  . TOTAL KNEE ARTHROPLASTY Right 09/07/2014   Procedure: RIGHT TOTAL KNEE ARTHROPLASTY;  Surgeon: Paralee Cancel, MD;  [facility];  Service: Orthopedics;  Laterality: Right;  . TOTAL KNEE  ARTHROPLASTY Left 12/20/2015   Procedure: LEFT TOTAL KNEE ARTHROPLASTY;  Surgeon: Paralee Cancel, MD;  [facility];  Service: Orthopedics;  Laterality: Left;  . TRANSTHORACIC ECHOCARDIOGRAM  04-03-2017   dr allred   Leory Plowman 55-60%/  mild MVP anterior leaflet (valve area 2.1cm^2) with mild regurg. , no stenosis/  trivial TR and PR  . VAGINAL HYSTERECTOMY  1975   W/ BILATERAL SALPINOOPHOORECTOMY  . VAGUS NERVE STIMULATOR INSERTION N/A 03/10/2014   Procedure: IMPLANTATION OF SACRAL  NERVE STIMULATOR ;  Surgeon: Leighton Ruff, MD;  Location: [place];  Service: General;  Laterality: N/A;  Sacral. [employer]    Current Outpatient Medications  Medication Sig Dispense Refill  . diphenhydrAMINE (BENADRYL) 25 mg capsule Take 25 mg by mouth at bedtime as needed for sleep.    . hydrochlorothiazide (HYDRODIURIL) 12.5 MG tablet Take 1 tablet (12.5 mg total) by mouth daily. (Patient taking differently: Take 12.5 mg by mouth every morning. ) 30 tablet 11  . lisinopril (PRINIVIL,ZESTRIL) 10 MG tablet Take 1 tablet (10 mg total) by mouth daily. (Patient taking differently: Take 10 mg by mouth every morning. ) 90 tablet 1  . nitroGLYCERIN (NITROSTAT) 0.4 MG SL tablet Place 1 tablet (0.4 mg total) under the tongue every 5 (five) minutes as needed for chest pain. 90 tablet 0  . pantoprazole (PROTONIX) 40 MG tablet Take 40 mg by mouth every morning.     [name] PRADAXA 150 MG CAPS capsule TAKE 1 CAPSULE EVERY 12 HOURS 180 capsule 0  . primidone (MYSOLINE) 50 MG tablet Take 2 tablets (100 mg total) by mouth at bedtime. 180 tablet 3  . rosuvastatin (CRESTOR) 40 MG tablet Take 1 tablet (40 mg total) by mouth daily. (Patient taking differently: Take 40 mg by mouth every morning. ) 30 tablet 11  . TOPROL XL 25 MG 24 hr tablet TAKE 1 TABLET TWICE A DAY 180 tablet 4  . traZODone (DESYREL) 50 MG tablet Take 150 mg by mouth at bedtime.     . colestipol (COLESTID) 1 g tablet Take 1 g by mouth 2 (two) times daily as  needed.     No current facility-administered medications for this visit.     Allergies:   Cephalexin   Social History:  The patient  reports that she quit smoking about 30 years ago. Her smoking use included cigarettes. She has a 10.00 pack-year smoking history. She has never used smokeless tobacco. She reports that she does not drink alcohol or use drugs.   Family History:  The patient's family history includes Coronary artery disease in her father; Heart disease in her mother and sister.  ROS:  Please see the history of present illness.  All other systems are reviewed and otherwise negative.  PHYSICAL EXAM:  VS:  BP 116/68   Pulse 70   Ht 5\' 3"  (1.6 m)   Wt 189 lb (85.7 kg)   BMI 33.48 kg/m  BMI: Body mass index is 33.48 kg/m. Well nourished, well developed, in no acute distress  HEENT: normocephalic, atraumatic  Neck: no JVD, carotid bruits or masses Cardiac:  RRR; no significant murmurs, no rubs, or gallops Lungs:  CTA b/l, no wheezing, rhonchi or rales  Abd: soft, nontender MS: no deformity or atrophy Ext: no edema  Skin: warm and dry, no rash Neuro:  No gross deficits appreciated Psych: euthymic mood, full affect     EKG:  Done today and reviewed by myself shows SR, some baseline movement, no acute changes   04/03/17: TTE Study Conclusions - Left ventricle: Systolic function was normal. The estimated   ejection fraction was in the range of 60% to 65%. - Aortic valve: Mildly thickened, mildly calcified leaflets. - Mitral valve: Mild prolapse, involving the anterior leaflet.   There was mild regurgitation. Valve area by pressure half-time:   2.1 cm^2. - Left atrium: Volume/bsa, ES, (1-plane Simpson&'s, A2C): 20.2   ml/m^2.   06/16/13; Stress test Impression Exercise Capacity:  Lexiscan with no exercise. BP Response:  Normal blood pressure response. Clinical Symptoms:  No significant symptoms noted. ECG Impression:  No significant ST segment change  suggestive of ischemia. Comparison with Prior Nuclear Study: No changes from  previous nuclear study performed 05/06/12.   Overall Impression:  Normal stress nuclear study. LV Ejection Fraction: 83%.  LV Wall Motion:  NL LV Function; NL Wall Motion.   cath 2013  ostial diagonal. A 40%, proximal mid RI 40-50%, RCA stent patent with 40-50% after stent then 40%, distal RCA 40-50% EF 55-65% medical management     Recent Labs: 06/04/2018: Hemoglobin 12.2; Potassium 3.3; Sodium 142  No results found for requested labs within last 8760 hours.   CrCl cannot be calculated (Patient's most recent lab result is older than the maximum 21 days allowed.).   Wt Readings from Last 3 Encounters:  04/10/19 189 lb (85.7 kg)  09/03/18 182 lb (82.6 kg)  06/04/18 186 lb 12.8 oz (84.7 kg)     Other studies reviewed: Additional studies/records reviewed today include: summarized above  ASSESSMENT AND PLAN:  1. AFib     H/o PVI ablation remotely     CHA2DS2Vasc is 6, on Pradaxa, labs are monitored via her primary     No symptoms to suggest recurrent AF  2. CAD     No anginal symptoms     On BB and high dose statin, has prn NTG available to her  3. HTN     Looks great, no changes  4. HLD     On Crestor     Monitored and managed by her primary     Disposition: F/u with [place] in 1 year, sooner if needed  Current medicines are reviewed at length with the patient today.  The patient did not have any concerns regarding medicines.  Venetia Night, PA-C 04/10/2019 10:35 AM     [facility] [place] [address] [phone] (office)  [phone] (fax)

## 2019-04-10 ENCOUNTER — Other Ambulatory Visit: Payer: Self-pay

## 2019-04-10 ENCOUNTER — Ambulatory Visit (INDEPENDENT_AMBULATORY_CARE_PROVIDER_SITE_OTHER): Payer: Medicare Other | Admitting: Physician Assistant

## 2019-04-10 VITALS — BP 116/68 | HR 70 | Ht 63.0 in | Wt 189.0 lb

## 2019-04-10 DIAGNOSIS — E785 Hyperlipidemia, unspecified: Secondary | ICD-10-CM | POA: Diagnosis not present

## 2019-04-10 DIAGNOSIS — I480 Paroxysmal atrial fibrillation: Secondary | ICD-10-CM

## 2019-04-10 DIAGNOSIS — I2510 Atherosclerotic heart disease of native coronary artery without angina pectoris: Secondary | ICD-10-CM

## 2019-04-10 DIAGNOSIS — I10 Essential (primary) hypertension: Secondary | ICD-10-CM

## 2019-04-10 MED ORDER — NITROGLYCERIN 0.4 MG SL SUBL: 0.4000 mg | SUBLINGUAL_TABLET | SUBLINGUAL | 0 refills | Status: DC | PRN

## 2019-04-10 NOTE — Patient Instructions (Signed)
Medication Instructions:  Your physician recommends that you continue on your current medications as directed. Please refer to the Current Medication list given to you today.  If you need a refill on your cardiac medications before your next appointment, please call your pharmacy.   Lab work: NONE ORDERED  TODAY   If you have labs (blood work) drawn today and your tests are completely normal, you will receive your results only by: [name] MyChart Message (if you have MyChart) OR . A paper copy in the mail If you have any lab test that is abnormal or we need to change your treatment, we will call you to review the results.  Testing/Procedures: NONE ORDERED  TODAY'   Follow-Up: At [facility], you and your health needs are our priority.  As part of our continuing mission to provide you with exceptional heart care, we have created designated Provider Care Teams.  These Care Teams include your primary Cardiologist (physician) and Advanced Practice Providers (APPs -  Physician Assistants and Nurse Practitioners) who all work together to provide you with the care you need, when you need it. You will need a follow up appointment in 1 years.  Please call our office 2 months in advance to schedule this appointment.  You may see Dr. Rayann Heman or one of the following Advanced Practice Providers on your designated Care Team:   Chanetta Marshall, NP . Tommye Standard, PA-C . Joesph July PA-C  Any Other Special Instructions Will Be Listed Below (If Applicable).

## 2019-04-24 ENCOUNTER — Other Ambulatory Visit: Payer: Self-pay | Admitting: Internal Medicine

## 2019-05-06 ENCOUNTER — Other Ambulatory Visit: Payer: Self-pay

## 2019-05-06 ENCOUNTER — Encounter: Payer: Self-pay | Admitting: Gastroenterology

## 2019-05-06 ENCOUNTER — Ambulatory Visit (INDEPENDENT_AMBULATORY_CARE_PROVIDER_SITE_OTHER): Payer: Medicare Other | Admitting: Gastroenterology

## 2019-05-06 ENCOUNTER — Telehealth: Payer: Self-pay

## 2019-05-06 VITALS — BP 106/72 | HR 68 | Temp 97.8°F | Ht 62.0 in | Wt 189.2 lb

## 2019-05-06 DIAGNOSIS — Z1159 Encounter for screening for other viral diseases: Secondary | ICD-10-CM | POA: Diagnosis not present

## 2019-05-06 DIAGNOSIS — I2510 Atherosclerotic heart disease of native coronary artery without angina pectoris: Secondary | ICD-10-CM | POA: Diagnosis not present

## 2019-05-06 DIAGNOSIS — R1011 Right upper quadrant pain: Secondary | ICD-10-CM

## 2019-05-06 DIAGNOSIS — Z7901 Long term (current) use of anticoagulants: Secondary | ICD-10-CM

## 2019-05-06 MED ORDER — DICYCLOMINE HCL 10 MG PO CAPS: 10.0000 mg | ORAL_CAPSULE | Freq: Two times a day (BID) | ORAL | 1 refills | Status: DC

## 2019-05-06 NOTE — Progress Notes (Signed)
05/06/2019 [name] [id number] 04-17-1946   HISTORY OF PRESENT ILLNESS: This is a pleasant 73 year old female who had been seen by Dr. Hilarie Fredrickson back in 2015 for complaints of fecal incontinence.  She has chronic diarrhea and has incontinence related to that.  She had followed with Dr. Leighton Ruff for that issue for quite some time.  Currently now just manages her symptoms with Imodium 3-4 times a day.  Nonetheless, she is here today with complaints of right upper quadrant abdominal pain.  This pain has been present she says for the most part since she had her gallbladder removed in 1975.  She said it may have started for a year or 2 after having that surgery.  It is present constantly on a daily basis, but worsens after eating.  She says that usually she has to stretch her body out in order for that area to stretch out and get some relief.  Seems to be getting worse over time.  She denies NSAID use.  She has been on pantoprazole 40 mg daily for quite some time.  She is on Pradaxa for history of atrial fibrillation and follows with Dr. Rayann Heman and the APPs in that practice for that issue, just seen recently.  Past Medical History:  Diagnosis Date  . Anticoagulated    Pradaxa  . Coronary artery disease cardiologist-- dr Rayann Heman   a.  s/p Xience DES to RCA 04/2009;   b. TEE 2/12: EF 40%, Large PFO;  c.  Lexiscan Myoview 05/2012: EF 69%, no ischemia. LHC (05/2012):  Ostial diagonal 30-40%, proximal mid ramus intermedius 40-50%, RCA stent patent with 40-50% after stent, then 40%, distal RCA 40-50%, EF 55-65%. Medical therapy continued.;  d.  Carlton Adam Myoview (06/2013):  No ischemia, EF 83%, normal study  . Full dentures   . GERD (gastroesophageal reflux disease)   . History of basal cell carcinoma excision    scalp  . History of hiatal hernia   . History of kidney stones 51 years ago   x1  . History of recurrent UTIs   . Hypertension   . IFG (impaired fasting glucose)   . MVP (mitral  valve prolapse)    mild per last echo 04-03-2017 in epic  . OA (osteoarthritis)    knees , fingers  . PAF (paroxysmal atrial fibrillation) [facility])    cardiologist-- dr Rayann Heman  . S/P ablation of atrial fibrillation 08/15/2010  . S/P drug eluting coronary stent placement 04/04/2009  . Tremor   . Wears glasses    Past Surgical History:  Procedure Laterality Date  . ANAL RECTAL MANOMETRY N/A 01/11/2014   Procedure: ANAL RECTAL MANOMETRY;  Surgeon: Leighton Ruff, MD;  [facility];  Service: Endoscopy;  Laterality: N/A;  . APPENDECTOMY  1973  . BACK SURGERY     laminectomy times 2; 2000 and 2001  . BUNIONECTOMY Bilateral 2013  . CARDIAC CATHETERIZATION  10-24-2010   dr allred   singl-vessel CAD with patent stent mRCA/  moderate disease mRCA beyond stent segment/  normal lvsf  . CARDIAC ELECTROPHYSIOLOGY MAPPING AND ABLATION  08-15-2010  dr allred  . CARDIOVASCULAR STRESS TEST  06-15-2013  dr allred   normal perfusion study/  no ischemia/  ef 83%  . CARPAL TUNNEL RELEASE Right 25 years ago  . CHOLECYSTECTOMY OPEN  1973   W/  APPENDECTOMY  . CORONARY ANGIOPLASTY WITH STENT PLACEMENT  04-04-2009  dr Darnell Level brodie   PCI and DES x1 to  mRCA/  mLAD  40%, pCX 30%, pRCA 30%/  normal LVF  . FLEXIBLE SIGMOIDOSCOPY N/A 07/07/2013   Procedure: FLEXIBLE SIGMOIDOSCOPY;  Surgeon: Winfield Cunas., MD;  Location: [facility];  Service: Endoscopy;  Laterality: N/A;  unprepped  . LEFT HEART CATHETERIZATION WITH CORONARY ANGIOGRAM N/A 05/09/2012   Procedure: LEFT HEART CATHETERIZATION WITH CORONARY ANGIOGRAM;  Surgeon: Hillary Bow, MD;  Location: [facility] CATH LAB;  Service: Cardiovascular;  Laterality: N/A;  . MOHS SURGERY  2009   scalp  . PILONIDAL CYST EXCISION  2009;   2000;   1999  . RECTAL ULTRASOUND N/A 01/11/2014   Procedure: RECTAL ULTRASOUND;  Surgeon: Leighton Ruff, MD;  [facility];  Service: Endoscopy;  Laterality: N/A;  . TOTAL KNEE ARTHROPLASTY Right 09/07/2014   Procedure:  RIGHT TOTAL KNEE ARTHROPLASTY;  Surgeon: Paralee Cancel, MD;  [facility];  Service: Orthopedics;  Laterality: Right;  . TOTAL KNEE ARTHROPLASTY Left 12/20/2015   Procedure: LEFT TOTAL KNEE ARTHROPLASTY;  Surgeon: Paralee Cancel, MD;  [facility];  Service: Orthopedics;  Laterality: Left;  . TRANSTHORACIC ECHOCARDIOGRAM  04-03-2017   dr allred   Leory Plowman 55-60%/  mild MVP anterior leaflet (valve area 2.1cm^2) with mild regurg. , no stenosis/  trivial TR and PR  . VAGINAL HYSTERECTOMY  1975   W/ BILATERAL SALPINOOPHOORECTOMY  . VAGUS NERVE STIMULATOR INSERTION N/A 03/10/2014   Procedure: IMPLANTATION OF SACRAL  NERVE STIMULATOR ;  Surgeon: Leighton Ruff, MD;  Location: [place];  Service: General;  Laterality: N/A;  Sacral. [employer]    reports that she quit smoking about 30 years ago. Her smoking use included cigarettes. She has a 10.00 pack-year smoking history. She has never used smokeless tobacco. She reports that she does not drink alcohol or use drugs. family history includes Coronary artery disease in her father; Heart disease in her mother and sister. Allergies  Allergen Reactions  . Cephalexin Nausea Only      Outpatient Encounter Medications as of 05/06/2019  Medication Sig  . colestipol (COLESTID) 1 g tablet Take 1 g by mouth 2 (two) times daily as needed.  . diphenhydrAMINE (BENADRYL) 25 mg capsule Take 25 mg by mouth at bedtime as needed for sleep.  . hydrochlorothiazide (HYDRODIURIL) 12.5 MG tablet Take 1 tablet (12.5 mg total) by mouth daily. (Patient taking differently: Take 12.5 mg by mouth every morning. )  . lisinopril (PRINIVIL,ZESTRIL) 10 MG tablet Take 1 tablet (10 mg total) by mouth daily. (Patient taking differently: Take 10 mg by mouth every morning. )  . nitroGLYCERIN (NITROSTAT) 0.4 MG SL tablet Place 1 tablet (0.4 mg total) under the tongue every 5 (five) minutes as needed for chest pain.  . pantoprazole (PROTONIX) 40 MG tablet Take 40 mg by mouth  every morning.   [name] PRADAXA 150 MG CAPS capsule TAKE 1 CAPSULE EVERY 12 HOURS  . primidone (MYSOLINE) 50 MG tablet Take 2 tablets (100 mg total) by mouth at bedtime.  . rosuvastatin (CRESTOR) 40 MG tablet TAKE 1 TABLET DAILY  . TOPROL XL 25 MG 24 hr tablet TAKE 1 TABLET TWICE A DAY  . traZODone (DESYREL) 50 MG tablet Take 150 mg by mouth at bedtime.    No facility-administered encounter medications on file as of 05/06/2019.      REVIEW OF SYSTEMS  : All other systems reviewed and negative except where noted in the History of Present Illness.   PHYSICAL EXAM: BP 106/72   Pulse 68   Temp 97.8 F (36.6 C)  Ht 5\' 2"  (1.575 m)   Wt 189 lb 3.2 oz (85.8 kg)   BMI 34.61 kg/m  General: Well developed white female in no acute distress Head: Normocephalic and atraumatic Eyes:  Sclerae anicteric, conjunctiva pink. Ears: Normal auditory acuity Lungs: Clear throughout to auscultation; no increased WOB. Heart: Regular rate and rhythm; no M/R/G. Abdomen: Soft, non-distended.  BS present.  RUQ TTP. Musculoskeletal: Symmetrical with no gross deformities  Skin: No lesions on visible extremities Extremities: No edema  Neurological: Alert oriented x 4, grossly non-focal Psychological:  Alert and cooperative. Normal mood and affect  ASSESSMENT AND PLAN: *RUQ abdominal pain: This pain has been present for the most part since she had her gallbladder removed in 1975.  She says it may have started a couple of years after that, but none the last has been present a long time.  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  Recent CT scan unremarkable.  We will plan for EGD to rule out ulcer disease, etc., but she has been on longstanding PPI therapy and I think this is likely to be low yield.  We will schedule with Dr. Hilarie Fredrickson.  I sent a prescription for dicyclomine 10 mg twice daily for her to try.  If this could be due to adhesions or possibly nerve  related type pain, question if Lyrica Cymbalta or other type of medication like that may help. *Chronic anticoagulation use with Pradaxa due to atrial fibrillation:  Will hold Pradaxa for 3 days prior to endoscopic procedures - will instruct when and how to resume after procedure. Benefits and risks of procedure explained including risks of bleeding, perforation, infection, missed lesions, reactions to medications and possible need for hospitalization and surgery for complications. Additional rare but real risk of stroke or other vascular clotting events off of Pradaxa also explained and need to seek urgent help if any signs of these problems occur. Will communicate by phone or EMR with patient's prescribing provider, Dr. Rayann Heman, to confirm that holding Pradaxa is reasonable in this case.    CC:  Fransisca Connors, PA-C

## 2019-05-06 NOTE — Telephone Encounter (Signed)
Patient with diagnosis of atrial fibrillation on Pradaxa for anticoagulation.    Procedure: endoscopy Date of procedure: 05/12/19  CHADS2-VASc score of 6-7 (CHF*, HTN, AGEx2, DM2, CAD, female) *CHF noted on problem list from ECHO 2013 showing grade 1 diastolic dysfunction; LVEF improved on most recent ECHO in 2018  CrCl ~55 mL/min (04/08/19: POC creatinine on CareEverywhere)  Per office protocol, patient can hold Pradaxa for 2 days prior to procedure.

## 2019-05-06 NOTE — Patient Instructions (Signed)
If you are age 75 or older, your body mass index should be between 23-30. Your Body mass index is 34.61 kg/m. If this is out of the aforementioned range listed, please consider follow up with your Primary Care Provider.  If you are age 37 or younger, your body mass index should be between 19-25. Your Body mass index is 34.61 kg/m. If this is out of the aformentioned range listed, please consider follow up with your Primary Care Provider.   You have been scheduled for an endoscopy. Please follow written instructions given to you at your visit today. If you use inhalers (even only as needed), please bring them with you on the day of your procedure. Your physician has requested that you go to www.startemmi.com and enter the access code given to you at your visit today. This web site gives a general overview about your procedure. However, you should still follow specific instructions given to you by our office regarding your preparation for the procedure.  We have sent the following medications to your pharmacy for you to pick up at your convenience: Dicyclomine 10 mg  You will be contacted by our office prior to your procedure for directions on holding your Pradaxa.  If you do not hear from our office 1 week prior to your scheduled procedure, please call [phone] to discuss.   Thank you for choosing me and [facility].   Alonza Bogus, PA-C

## 2019-05-06 NOTE — Telephone Encounter (Signed)
[facility] Pre-operative Risk Assessment     Request for surgical clearance:     Endoscopy Procedure  What type of surgery is being performed?     EGD  When is this surgery scheduled?     05/12/19  What type of clearance is required ?   Pharmacy  Are there any medications that need to be held prior to surgery and how long? HOLD PRADAXA 3 DAYS PRIOR TO EGD  Practice name and name of physician performing surgery?      [facility]/ Dr. Hilarie Fredrickson  What is your office phone and fax number?      Phone- [phone]  Fax- [phone] ATTN: Peter Congo  Anesthesia type (None, local, MAC, general) ?       MAC ATTN: Dr. Rayann Heman

## 2019-05-07 NOTE — Telephone Encounter (Signed)
   Primary Cardiologist: Thompson Grayer, MD  Chart reviewed as part of pre-operative protocol coverage. Given past medical history and time since last visit, based on ACC/AHA guidelines, [name] would be at acceptable risk for the planned procedure without further cardiovascular testing.   Patient with diagnosis of atrial fibrillation on Pradaxa for anticoagulation.    Procedure: endoscopy Date of procedure: 05/12/19  CHADS2-VASc score of 6-7 (CHF*, HTN, AGEx2, DM2, CAD, female)*CHF noted on problem list from ECHO 2013 showing grade 1 diastolic dysfunction; LVEF improved on most recent ECHO in 2018  CrCl ~55 mL/min (04/08/19: POC creatinine on CareEverywhere)  Per office protocol, patient can hold Pradaxa for 2 days prior to procedure.     I will route this recommendation to the requesting party via Epic fax function and remove from pre-op pool.  Please call with questions.  Kathyrn Drown, NP 05/07/2019, 8:04 AM

## 2019-05-07 NOTE — Telephone Encounter (Signed)
Yes okay to proceed with procedure with 2-day hold of Pradaxa

## 2019-05-07 NOTE — Progress Notes (Signed)
Addendum: Reviewed and agree with assessment and management plan. Pyrtle, Jay M, MD  

## 2019-05-07 NOTE — Telephone Encounter (Signed)
Spoke with patient today.  Advised to hold Pradaxa two days prior to procedure.  Patient verbalized understanding.

## 2019-05-08 LAB — SARS CORONAVIRUS 2 (TAT 6-24 HRS): SARS Coronavirus 2: NEGATIVE

## 2019-05-12 ENCOUNTER — Encounter: Payer: Self-pay | Admitting: Internal Medicine

## 2019-05-12 ENCOUNTER — Ambulatory Visit (AMBULATORY_SURGERY_CENTER): Payer: Medicare Other | Admitting: Internal Medicine

## 2019-05-12 ENCOUNTER — Other Ambulatory Visit: Payer: Self-pay

## 2019-05-12 VITALS — BP 131/62 | HR 55 | Temp 98.0°F | Resp 14 | Ht 62.0 in | Wt 189.0 lb

## 2019-05-12 DIAGNOSIS — R1011 Right upper quadrant pain: Secondary | ICD-10-CM

## 2019-05-12 MED ORDER — SODIUM CHLORIDE 0.9 % IV SOLN: 500.0000 mL | Freq: Once | INTRAVENOUS | Status: DC

## 2019-05-12 MED ORDER — PANTOPRAZOLE SODIUM 40 MG PO TBEC: 40.0000 mg | DELAYED_RELEASE_TABLET | Freq: Two times a day (BID) | ORAL | 3 refills | Status: DC

## 2019-05-12 NOTE — Progress Notes (Signed)
Report to PACU, RN, vss, BBS= Clear.  

## 2019-05-12 NOTE — Op Note (Addendum)
[place] Patient Name: [name] Procedure Date: 05/12/2019 1:27 PM MRN: [id number] Endoscopist: Jerene Bears , MD Age: 73 Referring MD:  Date of Birth: [birth year] Gender: Female Account #: [id number] Procedure:                Upper GI endoscopy Indications:              Abdominal pain in the right upper quadrant (chronic) Medicines:                Monitored Anesthesia Care Procedure:                Pre-Anesthesia Assessment:                           - Prior to the procedure, a History and Physical                            was performed, and patient medications and                            allergies were reviewed. The patient's tolerance of                            previous anesthesia was also reviewed. The risks                            and benefits of the procedure and the sedation                            options and risks were discussed with the patient.                            All questions were answered, and informed consent                            was obtained. Prior Anticoagulants: The patient has                            taken Pradaxa (dabigatran), last dose was 3 days                            prior to procedure. ASA Grade Assessment: II - A                            patient with mild systemic disease. After reviewing                            the risks and benefits, the patient was deemed in                            satisfactory condition to undergo the procedure.                           After obtaining informed consent, the endoscope was  passed under direct vision. Throughout the                            procedure, the patient's blood pressure, pulse, and                            oxygen saturations were monitored continuously. The                            Endoscope was introduced through the mouth, and                            advanced to the second part of duodenum. The upper           GI endoscopy was accomplished without difficulty.                            The patient tolerated the procedure well. Scope In: Scope Out: Findings:                 The examined esophagus was normal.                           The entire examined stomach was normal.                           The examined duodenum was normal. Complications:            No immediate complications. Estimated Blood Loss:     Estimated blood loss: none. Impression:               - Normal esophagus.                           - Normal stomach.                           - Normal examined duodenum.                           - No specimens collected. Recommendation:           - Patient has a contact number available for                            emergencies. The signs and symptoms of potential                            delayed complications were discussed with the                            patient. Return to normal activities tomorrow.                            Written discharge instructions were provided to the                            patient.                           -  Resume previous diet.                           - Continue present medications.                           - Increase pantoprazole to 40 mg twice daily as                            trial for RUQ pain. Dicyclomine was recently added                            which can be used if helpful for this pain. After 4                            weeks, please contact my office regarding the right                            upper abdominal pain. If persistent/worsening then                            would reduce PPI and consider MRI/MRCP. If MRCP is                            negative amitriptyline may be a good option for                            chronic right upper abdominal pain.                           - Resume Pradaxa (dabigatran) at prior dose today.                            Refer to managing physician for further adjustment                             of therapy. Jerene Bears, MD 05/12/2019 1:57:45 PM This report has been signed electronically.

## 2019-05-12 NOTE — Progress Notes (Signed)
Temp JB Vitals CW 

## 2019-05-12 NOTE — Patient Instructions (Signed)
Please read handouts provided. Continue present medications. Resume Pradaxa ( dabigatran ) at prior dose today. Take pantoprazole 40 mg twice daily.      YOU HAD AN ENDOSCOPIC PROCEDURE TODAY AT [facility]:   Refer to the procedure report that was given to you for any specific questions about what was found during the examination.  If the procedure report does not answer your questions, please call your gastroenterologist to clarify.  If you requested that your care partner not be given the details of your procedure findings, then the procedure report has been included in a sealed envelope for you to review at your convenience later.  YOU SHOULD EXPECT: Some feelings of bloating in the abdomen. Passage of more gas than usual.  Walking can help get rid of the air that was put into your GI tract during the procedure and reduce the bloating. If you had a lower endoscopy (such as a colonoscopy or flexible sigmoidoscopy) you may notice spotting of blood in your stool or on the toilet paper. If you underwent a bowel prep for your procedure, you may not have a normal bowel movement for a few days.  Please Note:  You might notice some irritation and congestion in your nose or some drainage.  This is from the oxygen used during your procedure.  There is no need for concern and it should clear up in a day or so.  SYMPTOMS TO REPORT IMMEDIATELY:    Following upper endoscopy (EGD)  Vomiting of blood or coffee ground material  New chest pain or pain under the shoulder blades  Painful or persistently difficult swallowing  New shortness of breath  Fever of 100F or higher  Black, tarry-looking stools  For urgent or emergent issues, a gastroenterologist can be reached at any hour by calling [phone].   DIET:  We do recommend a small meal at first, but then you may proceed to your regular diet.  Drink plenty of fluids but you should avoid alcoholic beverages for 24  hours.  ACTIVITY:  You should plan to take it easy for the rest of today and you should NOT DRIVE or use heavy machinery until tomorrow (because of the sedation medicines used during the test).    FOLLOW UP: Our staff will call the number listed on your records 48-72 hours following your procedure to check on you and address any questions or concerns that you may have regarding the information given to you following your procedure. If we do not reach you, we will leave a message.  We will attempt to reach you two times.  During this call, we will ask if you have developed any symptoms of COVID 19. If you develop any symptoms (ie: fever, flu-like symptoms, shortness of breath, cough etc.) before then, please call [phone].  If you test positive for Covid 19 in the 2 weeks post procedure, please call and report this information to [place].    If any biopsies were taken you will be contacted by phone or by letter within the next 1-3 weeks.  Please call us at [phone] if you have not heard about the biopsies in 3 weeks.    SIGNATURES/CONFIDENTIALITY: You and/or your care partner have signed paperwork which will be entered into your electronic medical record.  These signatures attest to the fact that that the information above on your After Visit Summary has been reviewed and is understood.  Full responsibility of the confidentiality of this discharge information lies  with you and/or your care-partner.

## 2019-05-14 ENCOUNTER — Telehealth: Payer: Self-pay | Admitting: *Deleted

## 2019-05-14 ENCOUNTER — Telehealth: Payer: Self-pay

## 2019-05-14 MED ORDER — PANTOPRAZOLE SODIUM 40 MG PO TBEC: 40.0000 mg | DELAYED_RELEASE_TABLET | Freq: Two times a day (BID) | ORAL | 0 refills | Status: DC

## 2019-05-14 NOTE — Telephone Encounter (Signed)
Left message on answering machine. 

## 2019-05-14 NOTE — Telephone Encounter (Signed)
Second post procedure follow up call, no answer 

## 2019-05-14 NOTE — Telephone Encounter (Signed)
One month rx of pantoprazole 40 mg twice daily sent to [facility]. Patient is to be in touch with [place] regarding whether increase in PPI helps abdominal pain.

## 2019-05-15 ENCOUNTER — Other Ambulatory Visit: Payer: Self-pay | Admitting: Internal Medicine

## 2019-05-15 NOTE — Telephone Encounter (Signed)
Prescription refill request for Pradaxa received.   Last OVCharlcie Krause, (04-10-2019) Scr: 1.13, (01-29-2019) Kg: 85.8 kg CrCl: 58 ml/min  Prescription refill sent.

## 2019-06-01 ENCOUNTER — Other Ambulatory Visit: Payer: Self-pay

## 2019-06-01 MED ORDER — PANTOPRAZOLE SODIUM 40 MG PO TBEC: 40.0000 mg | DELAYED_RELEASE_TABLET | Freq: Two times a day (BID) | ORAL | 1 refills | Status: DC

## 2019-06-28 ENCOUNTER — Other Ambulatory Visit: Payer: Self-pay | Admitting: Internal Medicine

## 2019-07-03 ENCOUNTER — Other Ambulatory Visit: Payer: Self-pay | Admitting: Physician Assistant

## 2019-07-23 ENCOUNTER — Ambulatory Visit: Payer: Medicare Other | Attending: Internal Medicine

## 2019-07-23 DIAGNOSIS — Z23 Encounter for immunization: Secondary | ICD-10-CM

## 2019-07-23 NOTE — Progress Notes (Signed)
   Covid-19 Vaccination Clinic  Name:  [name]    MRN: [id number] DOB: 1945/08/27  07/23/2019  [name] was observed post Covid-19 immunization for 15 minutes without incidence. She was provided with Vaccine Information Sheet and instruction to access the V-Safe system.   [name] was instructed to call 911 with any severe reactions post vaccine: [name] Difficulty breathing  . Swelling of your face and throat  . A fast heartbeat  . A bad rash all over your body  . Dizziness and weakness    Immunizations Administered    Name Date Dose VIS Date Route   Pfizer COVID-19 Vaccine 07/23/2019  9:57 AM 0.3 mL 06/12/2019 Intramuscular   Manufacturer: [place]   Lot: [id number]   [place]: [id number]

## 2019-08-13 ENCOUNTER — Ambulatory Visit: Payer: Medicare Other | Attending: Internal Medicine

## 2019-08-13 DIAGNOSIS — Z23 Encounter for immunization: Secondary | ICD-10-CM

## 2019-08-13 NOTE — Progress Notes (Signed)
   Covid-19 Vaccination Clinic  Name:  [name]    MRN: [id number] DOB: 20-Aug-1945  08/13/2019  Ms. [name] was observed post Covid-19 immunization for 15 minutes without incidence. She was provided with Vaccine Information Sheet and instruction to access the V-Safe system.   [name] was instructed to call 911 with any severe reactions post vaccine: [name] Difficulty breathing  . Swelling of your face and throat  . A fast heartbeat  . A bad rash all over your body  . Dizziness and weakness    Immunizations Administered    Name Date Dose VIS Date Route   Pfizer COVID-19 Vaccine 08/13/2019 10:24 AM 0.3 mL 06/12/2019 Intramuscular   Manufacturer: [place]   Lot: [place] [id number]   [place]: [id number]

## 2019-08-31 ENCOUNTER — Other Ambulatory Visit: Payer: Self-pay

## 2019-09-01 ENCOUNTER — Ambulatory Visit (INDEPENDENT_AMBULATORY_CARE_PROVIDER_SITE_OTHER): Payer: Medicare Other | Admitting: Obstetrics and Gynecology

## 2019-09-01 ENCOUNTER — Encounter: Payer: Self-pay | Admitting: Obstetrics and Gynecology

## 2019-09-01 VITALS — BP 124/78 | Ht 62.0 in | Wt 188.0 lb

## 2019-09-01 DIAGNOSIS — Z90710 Acquired absence of both cervix and uterus: Secondary | ICD-10-CM | POA: Diagnosis not present

## 2019-09-01 DIAGNOSIS — N950 Postmenopausal bleeding: Secondary | ICD-10-CM | POA: Diagnosis not present

## 2019-09-01 DIAGNOSIS — Z8742 Personal history of other diseases of the female genital tract: Secondary | ICD-10-CM

## 2019-09-01 NOTE — Progress Notes (Signed)
   [name]  Jul 29, 1945 [id number]  HPI The patient is a 74 y.o. [id number] who presents today for evaluation 1 month history of vaginal bleeding.  She says it has occurred every few days for the last month, sometimes somewhat heavy, but it does stop in between episodes.  She did use a tampon when it happened and noted it was definitely vaginal blood.  She had a hysterectomy with patient reported ovary removal in 1977 for fibroids and endometriosis.  No recall of any precancerous or cancer tissue.  Last time she used vaginal estrogen was 8-9 years ago.  Not sexually active as her husband has had impotence for several years now.  No dysuria.  No blood in stool or melena.  She is not having any pain, vaginal discharge, or odor.  No known history of abnormal Pap smears.  No vaginal trauma.  Has not had any further bleeding in the last 1.5 weeks.  Past medical history,surgical history, problem list, medications, allergies, family history and social history were all reviewed and documented as reviewed in the EPIC chart.  ROS:  Feeling well. No dyspnea or chest pain on exertion.  No abdominal pain, change in bowel habits, black or bloody stools.  No urinary tract symptoms. GYN ROS: + abnormal bleeding, pelvic pain or discharge  Physical Exam  BP 124/78   Ht 5\' 2"  (1.575 m)   Wt 188 lb (85.3 kg)   BMI 34.39 kg/m   General: Pleasant female, no acute distress, alert and oriented PELVIC EXAM: VULVA: normal appearing vulva with no masses, tenderness or lesions, atrophic vulvar changes noted, VAGINA: normal appearing vagina with normal color and discharge, no lesions, no bleeding, no laceration, no masses, CERVIX: surgically absent, UTERUS: surgically absent, vaginal cuff well healed, ADNEXA: no masses, non tender.  Rectal: No hemorrhoids externally  [name] present for the examination  Assessment 74 yo postmenopausal female with vaginal bleeding episodes of uncertain etiology.  Prior hysterectomy  noted for benign disease in the distant past.  Plan Discussed that it is unclear as to the source of her vaginal bleeding.  There is no evidence of suspicious findings in the vaginal or vulvar tissues today.  This could simply be because due to minor trauma in the atrophic tissues from cleansing or another cause.  Today there are no abnormalities.  I reminded her to be cautious with cleansing practices and if this bleeding is continuing, she will need to return for evaluation.  Joseph Pierini MD 09/01/19

## 2019-09-07 ENCOUNTER — Encounter: Payer: Self-pay | Admitting: Adult Health

## 2019-09-07 ENCOUNTER — Ambulatory Visit (INDEPENDENT_AMBULATORY_CARE_PROVIDER_SITE_OTHER): Payer: Medicare Other | Admitting: Adult Health

## 2019-09-07 ENCOUNTER — Other Ambulatory Visit: Payer: Self-pay

## 2019-09-07 VITALS — BP 128/78 | HR 67 | Temp 97.7°F | Ht 62.0 in | Wt 188.8 lb

## 2019-09-07 DIAGNOSIS — G250 Essential tremor: Secondary | ICD-10-CM | POA: Diagnosis not present

## 2019-09-07 MED ORDER — PRIMIDONE 50 MG PO TABS: 100.0000 mg | ORAL_TABLET | Freq: Every day | ORAL | 3 refills | Status: DC

## 2019-09-07 NOTE — Patient Instructions (Signed)
Your Plan:  Continue Primidone If your symptoms worsen or you develop new symptoms please let us know.    Thank you for coming to see us at [facility]. I hope we have been able to provide you high quality care today.  You may receive a patient satisfaction survey over the next few weeks. We would appreciate your feedback and comments so that we may continue to improve ourselves and the health of our patients.  

## 2019-09-07 NOTE — Progress Notes (Addendum)
PATIENT: [name] DOB: 20-Jul-1945  REASON FOR VISIT: follow up HISTORY FROM: patient  HISTORY OF PRESENT ILLNESS: Today 09/07/19:  Ms. Orser is a 74 year old female with a history of essential tremor.  She returns today for follow-up.  She is currently on primidone 100 mg at bedtime.  Reports that she is tolerating this well.  Feels that her tremor is manageable.  She notes that she is able to feed herself.  Does not have any trouble with her handwriting.  Overall she feels that she is doing well.  HISTORY (copied from Dr. Guadelupe Sabin note) 09/02/2018: She reportsdoing okay, stable the tremor. She had a sacral nerve stimulator removal in December 2019.Had a subsequent L spine MRI through [facility]. Has regular blood work from PCP, no issues with liver kidney function per her report. She does not have any issues with diarrhea any longer.   REVIEW OF SYSTEMS: Out of a complete 14 system review of symptoms, the patient complains only of the following symptoms, and all other reviewed systems are negative.  See HPI  ALLERGIES: Allergies  Allergen Reactions  . Cephalexin Nausea Only    HOME MEDICATIONS: Outpatient Medications Prior to Visit  Medication Sig Dispense Refill  . colestipol (COLESTID) 1 g tablet Take 1 g by mouth 2 (two) times daily as needed.    . dicyclomine (BENTYL) 10 MG capsule Take 1 capsule (10 mg total) by mouth 2 (two) times daily. 60 capsule 1  . diphenhydrAMINE (BENADRYL) 25 mg capsule Take 25 mg by mouth at bedtime as needed for sleep.    . hydrochlorothiazide (HYDRODIURIL) 12.5 MG tablet Take 1 tablet (12.5 mg total) by mouth daily. (Patient taking differently: Take 12.5 mg by mouth every morning. ) 30 tablet 11  . lisinopril (PRINIVIL,ZESTRIL) 10 MG tablet Take 1 tablet (10 mg total) by mouth daily. (Patient taking differently: Take 10 mg by mouth every morning. ) 90 tablet 1  . metoprolol succinate (TOPROL-XL) 25 MG 24 hr tablet TAKE 1 TABLET TWICE  A DAY 180 tablet 3  . nitroGLYCERIN (NITROSTAT) 0.4 MG SL tablet DISSOLVE 1 TABLET UNDER THE TONGUE EVERY 5 MINUTES AS NEEDED FOR CHEST PAIN 75 tablet 3  . pantoprazole (PROTONIX) 40 MG tablet Take 1 tablet (40 mg total) by mouth 2 (two) times daily. 180 tablet 1  . PRADAXA 150 MG CAPS capsule TAKE 1 CAPSULE EVERY 12 HOURS (NEEDS CARDIOLOGY APPOINTMENT FOR FUTURE REFILLS) 180 capsule 1  . primidone (MYSOLINE) 50 MG tablet Take 2 tablets (100 mg total) by mouth at bedtime. 180 tablet 3  . rosuvastatin (CRESTOR) 40 MG tablet TAKE 1 TABLET DAILY 90 tablet 3  . traZODone (DESYREL) 50 MG tablet Take 150 mg by mouth at bedtime.      No facility-administered medications prior to visit.    PAST MEDICAL HISTORY: Past Medical History:  Diagnosis Date  . Anticoagulated    Pradaxa  . Coronary artery disease cardiologist-- dr Rayann Heman   a.  s/p Xience DES to RCA 04/2009;   b. TEE 2/12: EF 40%, Large PFO;  c.  Lexiscan Myoview 05/2012: EF 69%, no ischemia. LHC (05/2012):  Ostial diagonal 30-40%, proximal mid ramus intermedius 40-50%, RCA stent patent with 40-50% after stent, then 40%, distal RCA 40-50%, EF 55-65%. Medical therapy continued.;  d.  Carlton Adam Myoview (06/2013):  No ischemia, EF 83%, normal study  . Full dentures   . GERD (gastroesophageal reflux disease)   . History of basal cell carcinoma excision  scalp  . History of hiatal hernia   . History of kidney stones 51 years ago   x1  . History of recurrent UTIs   . Hypertension   . IFG (impaired fasting glucose)   . MVP (mitral valve prolapse)    mild per last echo 04-03-2017 in epic  . OA (osteoarthritis)    knees , fingers  . PAF (paroxysmal atrial fibrillation) [facility])    cardiologist-- dr Rayann Heman  . S/P ablation of atrial fibrillation 08/15/2010  . S/P drug eluting coronary stent placement 04/04/2009  . Tremor   . Wears glasses     PAST SURGICAL HISTORY: Past Surgical History:  Procedure Laterality Date  . ANAL RECTAL MANOMETRY  N/A 01/11/2014   Procedure: ANAL RECTAL MANOMETRY;  Surgeon: Leighton Ruff, MD;  Location: [facility];  Service: Endoscopy;  Laterality: N/A;  . APPENDECTOMY  1973  . BACK SURGERY     laminectomy times 2; 2000 and 2001  . BUNIONECTOMY Bilateral 2013  . CARDIAC CATHETERIZATION  10-24-2010   dr allred   singl-vessel CAD with patent stent mRCA/  moderate disease mRCA beyond stent segment/  normal lvsf  . CARDIAC ELECTROPHYSIOLOGY MAPPING AND ABLATION  08-15-2010  dr allred  . CARDIOVASCULAR STRESS TEST  06-15-2013  dr allred   normal perfusion study/  no ischemia/  ef 83%  . CARPAL TUNNEL RELEASE Right 25 years ago  . CHOLECYSTECTOMY OPEN  1973   W/  APPENDECTOMY  . CORONARY ANGIOPLASTY WITH STENT PLACEMENT  04-04-2009  dr Darnell Level brodie   PCI and DES x1 to  mRCA/  mLAD 40%, pCX 30%, pRCA 30%/  normal LVF  . FLEXIBLE SIGMOIDOSCOPY N/A 07/07/2013   Procedure: FLEXIBLE SIGMOIDOSCOPY;  Surgeon: Winfield Cunas., MD;  Location: [facility];  Service: Endoscopy;  Laterality: N/A;  unprepped  . LEFT HEART CATHETERIZATION WITH CORONARY ANGIOGRAM N/A 05/09/2012   Procedure: LEFT HEART CATHETERIZATION WITH CORONARY ANGIOGRAM;  Surgeon: Hillary Bow, MD;  Location[facility] C[facility] LAB;  Service: Cardiovascular;  Laterality: N/A;  . MOHS SURGERY  2009   scalp  . PILONIDAL CYST EXCISION  2009;   2000;   1999  . RECTAL ULTRASOUND N/A 01/11/2014   Procedure: RECTAL ULTRASOUND;  Surgeon: Leighton Ruff, MD;  Location: [facility];  Service: Endoscopy;  Laterality: N/A;  . TOTAL KNEE ARTHROPLASTY Right 09/07/2014   Procedure: RIGHT TOTAL KNEE ARTHROPLASTY;  Surgeon: Paralee Cancel, MD;  Location: [facility];  Service: Orthopedics;  Laterality: Right;  . TOTAL KNEE ARTHROPLASTY Left 12/20/2015   Procedure: LEFT TOTAL KNEE ARTHROPLASTY;  Surgeon: Paralee Cancel, MD;  Location: [facility];  Service: Orthopedics;  Laterality: Left;  . TRANSTHORACIC ECHOCARDIOGRAM  04-03-2017   dr allred   Leory Plowman 55-60%/  mild MVP anterior leaflet  (valve area 2.1cm^2) with mild regurg. , no stenosis/  trivial TR and PR  . VAGINAL HYSTERECTOMY  1975   W/ BILATERAL SALPINOOPHOORECTOMY  . VAGUS NERVE STIMULATOR INSERTION N/A 03/10/2014   Procedure: IMPLANTATION OF SACRAL  NERVE STIMULATOR ;  Surgeon: Leighton Ruff, MD;  Location: [facility];  Service: General;  Laterality: N/A;  Sacral. [employer]    FAMILY HISTORY: Family History  Problem Relation Age of Onset  . Coronary artery disease Father   . Heart disease Mother   . Heart disease Sister   . Heart disease Brother     SOCIAL HISTORY: Social History   Socioeconomic History  . Marital status: Married    Spouse name: Not on file  .  Number of children: 2  . Years of education: HS  . Highest education level: Not on file  Occupational History  . Occupation: retired    Fish farm manager: RETIRED  Tobacco Use  . Smoking status: Former Smoker    Packs/day: 0.50    Years: 20.00    Pack years: 10.00    Types: Cigarettes    Quit date: 07/02/1988    Years since quitting: 31.2  . Smokeless tobacco: Never Used  Substance and Sexual Activity  . Alcohol use: No  . Drug use: Never  . Sexual activity: Not Currently    Birth control/protection: Surgical    Comment: 1st intercourse 53 yo-1 partner  Other Topics Concern  . Not on file  Social History Narrative  . Not on file   Social Determinants of Health   Financial Resource Strain:   . Difficulty of Paying Living Expenses: Not on file  Food Insecurity:   . Worried About Charity fundraiser in the Last Year: Not on file  . Ran Out of Food in the Last Year: Not on file  Transportation Needs:   . Lack of Transportation (Medical): Not on file  . Lack of Transportation (Non-Medical): Not on file  Physical Activity:   . Days of Exercise per Week: Not on file  . Minutes of Exercise per Session: Not on file  Stress:   . Feeling of Stress : Not on file  Social Connections:   . Frequency of Communication with Friends and  Family: Not on file  . Frequency of Social Gatherings with Friends and Family: Not on file  . Attends Religious Services: Not on file  . Active Member of Clubs or Organizations: Not on file  . Attends Archivist Meetings: Not on file  . Marital Status: Not on file  Intimate Partner Violence:   . Fear of Current or Ex-Partner: Not on file  . Emotionally Abused: Not on file  . Physically Abused: Not on file  . Sexually Abused: Not on file      PHYSICAL EXAM  Vitals:   09/07/19 1348  BP: 128/78  Pulse: 67  Temp: 97.7 F (36.5 C)  TempSrc: Oral  Weight: 188 lb 12.8 oz (85.6 kg)  Height: 5\' 2"  (1.575 m)   Body mass index is 34.53 kg/m.  Generalized: Well developed, in no acute distress   Neurological examination  Mentation: Alert oriented to time, place, history taking. Follows all commands speech and language fluent Cranial nerve II-XII: Pupils were equal round reactive to light. Extraocular movements were full, visual field were full on confrontational test.  Head turning and shoulder shrug  were normal and symmetric. Motor: The motor testing reveals 5 over 5 strength of all 4 extremities. Good symmetric motor tone is noted throughout.  Sensory: Sensory testing is intact to soft touch on all 4 extremities. No evidence of extinction is noted.  Coordination: Cerebellar testing reveals good finger-nose-finger and heel-to-shin bilaterally.  Mild intention tremor noted in the upper extremities  gait and station: Gait is normal.  Reflexes: Deep tendon reflexes are symmetric and normal bilaterally.   DIAGNOSTIC DATA (LABS, IMAGING, TESTING) - I reviewed patient records, labs, notes, testing and imaging myself where available.  Lab Results  Component Value Date   WBC 10.0 12/21/2015   HGB 12.2 06/04/2018   HCT 36.0 06/04/2018   MCV 91.3 12/21/2015   PLT 123 (L) 12/21/2015      Component Value Date/Time   NA 142 06/04/2018 0709  NA 139 04/03/2017 1448   K 3.3  (L) 06/04/2018 0709   CL 99 04/03/2017 1448   CO2 23 04/03/2017 1448   GLUCOSE 91 06/04/2018 0709   BUN 16 04/03/2017 1448   CREATININE 1.26 (H) 04/03/2017 1448   CALCIUM 8.8 04/03/2017 1448   PROT 6.6 07/15/2013 0836   ALBUMIN 4.1 07/15/2013 0836   AST 17 07/15/2013 0836   ALT 12 07/15/2013 0836   ALKPHOS 56 07/15/2013 0836   BILITOT 0.3 07/15/2013 0836   GFRNONAA 42 (L) 04/03/2017 1448   GFRAA 49 (L) 04/03/2017 1448   Lab Results  Component Value Date   CHOL (H) 04/01/2009    218        ATP III CLASSIFICATION:  <200     mg/dL   Desirable  200-239  mg/dL   Borderline High  >=240    mg/dL   High          HDL 33 (L) 04/01/2009   LDLCALC (H) 04/01/2009    126        Total Cholesterol/HDL:CHD Risk Coronary Heart Disease Risk Table                     Men   Women  1/2 Average Risk   3.4   3.3  Average Risk       5.0   4.4  2 X Average Risk   9.6   7.1  3 X Average Risk  23.4   11.0        Use the calculated Patient Ratio above and the CHD Risk Table to determine the patient's CHD Risk.        ATP III CLASSIFICATION (LDL):  <100     mg/dL   Optimal  100-129  mg/dL   Near or Above                    Optimal  130-159  mg/dL   Borderline  160-189  mg/dL   High  >190     mg/dL   Very High   TRIG 295 (H) 04/01/2009   CHOLHDL 6.6 04/01/2009   Lab Results  Component Value Date   HGBA1C 5.5 12/14/2015   No results found for: VITAMINB12 Lab Results  Component Value Date   TSH 2.657 07/06/2013      ASSESSMENT AND PLAN 74 y.o. year old female  has a past medical history of Anticoagulated, Coronary artery disease (cardiologist-- dr Rayann Heman), Full dentures, GERD (gastroesophageal reflux disease), History of basal cell carcinoma excision, History of hiatal hernia, History of kidney stones (51 years ago), History of recurrent UTIs, Hypertension, IFG (impaired fasting glucose), MVP (mitral valve prolapse), OA (osteoarthritis), PAF (paroxysmal atrial fibrillation) ([facility]), S/P  ablation of atrial fibrillation (08/15/2010), S/P drug eluting coronary stent placement (04/04/2009), Tremor, and Wears glasses. here with:  1: Essential tremor  -Continue Mysoline 100 mg at bedtime -Continue to monitor symptoms -Patient requested for primary care can manage this.  I am amendable to this. -Follow-up on an as-needed basis.    I spent 15 minutes of face-to-face and non-face-to-face time with patient.  This included previsit chart review, order entry, electronic health record documentation, patient education.  Ward Givens, MSN, NP-C 09/07/2019, 2:00 PM [facility] [address] [place], [place] [address] [phone]  I reviewed the above note and documentation by the Nurse Practitioner and agree with the history, exam, assessment and plan as outlined above. I was available for  consultation. Star Age, MD, PhD [facility])

## 2019-10-18 NOTE — H&P (View-Only) (Signed)
Cardiology Office Note Date:  10/18/2019  Patient ID:  Khalis, Lasane 07-Jun-1946, MRN [id number] PCP:  Fransisca Connors, PA-C  Electrophysiologist:  Dr. Rayann Heman    Chief Complaint:  CP, DOE  History of Present Illness: CERYS BALDUS is a 74 y.o. female with history of CAD (DES to the RCA in 2010), HTN, HLD, DM, and AFib (s/p PVI ablation in 2012), essential tremor (follws with [facility])   She comes in today to be seen for Dr. Rayann Heman, last seen by him Sept 2019.  She was doing very well, traveling extensively.  No changes were made, she was encouraged life style modification for obesity, CAD, general health.   I saw her Oct 2020, she continued to do very well.  She was active in/around the house, cleaning and doing projects.  Denied any CP, palpitations, or SOB.  No exertional intolerances, no DOE.   She denied any symptoms of PND or orthopnea, no near syncope or syncope. She reported labs done twice this year via her primary, he also manages her lipids as well. She denied any bleeding or signs of bleeding on her pradaxa. No changes were made and planned for annual visit  TODAY She comes with progressive DOE and c/o CP.  She states around [employer] she started noticing she was getting tired easier then usual, getting winded with some of her usual activities, over the last couple months able to less and less without SOB.  In the last 2-3 weeks associiated with sharp twinges of CP.  She reports last week she got so weak after grocery shopping that her husband had to help her from the car, she was SOB.  BP 179/118 and eventually did settle down, not typically is ever that high. BP after ambulating in today was 118/64  She does not have rest SOB, no symptoms of orthopnea or PND.  She does not feel bloated or like she is retaining fluid.  She has not noticed any swelling of her LE.  Once she sits the SOB and CP do resolve, usually within a minute or so.  No near  syncope or syncope, no palpitations.  No bleeding or signs of bleeding, reports compliance with her pradaxa and all of her medicines.  No cough, fever, or symptoms of illness  She recalls her symptom prior to her stent as being L shoulder pain, went to her PMD for shoulder pain and was sent to the cardiologist.   No shoulder pain associated with these recent symptoms.  She does have a component of chronic shoulder pain that over the years has had injections for.     Past Medical History:  Diagnosis Date  . Anticoagulated    Pradaxa  . Coronary artery disease cardiologist-- dr Rayann Heman   a.  s/p Xience DES to RCA 04/2009;   b. TEE 2/12: EF 40%, Large PFO;  c.  Lexiscan Myoview 05/2012: EF 69%, no ischemia. LHC (05/2012):  Ostial diagonal 30-40%, proximal mid ramus intermedius 40-50%, RCA stent patent with 40-50% after stent, then 40%, distal RCA 40-50%, EF 55-65%. Medical therapy continued.;  d.  Carlton Adam Myoview (06/2013):  No ischemia, EF 83%, normal study  . Full dentures   . GERD (gastroesophageal reflux disease)   . History of basal cell carcinoma excision    scalp  . History of hiatal hernia   . History of kidney stones 51 years ago   x1  . History of recurrent UTIs   . Hypertension   .  IFG (impaired fasting glucose)   . MVP (mitral valve prolapse)    mild per last echo 04-03-2017 in epic  . OA (osteoarthritis)    knees , fingers  . PAF (paroxysmal atrial fibrillation) [facility])    cardiologist-- dr Rayann Heman  . S/P ablation of atrial fibrillation 08/15/2010  . S/P drug eluting coronary stent placement 04/04/2009  . Tremor   . Wears glasses     Past Surgical History:  Procedure Laterality Date  . ANAL RECTAL MANOMETRY N/A 01/11/2014   Procedure: ANAL RECTAL MANOMETRY;  Surgeon: Leighton Ruff, MD;  Location: [facility]OSCOPY;  Service: Endoscopy;  Laterality: N/A;  . APPENDECTOMY  1973  . BACK SURGERY     laminectomy times 2; 2000 and 2001  . BUNIONECTOMY Bilateral 2013  .  CARDIAC CATHETERIZATION  10-24-2010   dr allred   singl-vessel CAD with patent stent mRCA/  moderate disease mRCA beyond stent segment/  normal lvsf  . CARDIAC ELECTROPHYSIOLOGY MAPPING AND ABLATION  08-15-2010  dr allred  . CARDIOVASCULAR STRESS TEST  06-15-2013  dr allred   normal perfusion study/  no ischemia/  ef 83%  . CARPAL TUNNEL RELEASE Right 25 years ago  . CHOLECYSTECTOMY OPEN  1973   W/  APPENDECTOMY  . CORONARY ANGIOPLASTY WITH STENT PLACEMENT  04-04-2009  dr Darnell Level brodie   PCI and DES x1 to  mRCA/  mLAD 40%, pCX 30%, pRCA 30%/  normal LVF  . FLEXIBLE SIGMOIDOSCOPY N/A 07/07/2013   Procedure: FLEXIBLE SIGMOIDOSCOPY;  Surgeon: Winfield Cunas., MD;  Location: [facility];  Service: Endoscopy;  Laterality: N/A;  unprepped  . LEFT HEART CATHETERIZATION WITH CORONARY ANGIOGRAM N/A 05/09/2012   Procedure: LEFT HEART CATHETERIZATION WITH CORONARY ANGIOGRAM;  Surgeon: Hillary Bow, MD;  Location: [facility] CATH LAB;  Service: Cardiovascular;  Laterality: N/A;  . MOHS SURGERY  2009   scalp  . PILONIDAL CYST EXCISION  2009;   2000;   1999  . RECTAL ULTRASOUND N/A 01/11/2014   Procedure: RECTAL ULTRASOUND;  Surgeon: Leighton Ruff, MD;  Location: [facility];  Service: Endoscopy;  Laterality: N/A;  . TOTAL KNEE ARTHROPLASTY Right 09/07/2014   Procedure: RIGHT TOTAL KNEE ARTHROPLASTY;  Surgeon: Paralee Cancel, MD;  Location: [facility];  Service: Orthopedics;  Laterality: Right;  . TOTAL KNEE ARTHROPLASTY Left 12/20/2015   Procedure: LEFT TOTAL KNEE ARTHROPLASTY;  Surgeon: Paralee Cancel, MD;  Location: [facility];  Service: Orthopedics;  Laterality: Left;  . TRANSTHORACIC ECHOCARDIOGRAM  04-03-2017   dr allred   Leory Plowman 55-60%/  mild MVP anterior leaflet (valve area 2.1cm^2) with mild regurg. , no stenosis/  trivial TR and PR  . VAGINAL HYSTERECTOMY  1975   W/ BILATERAL SALPINOOPHOORECTOMY  . VAGUS NERVE STIMULATOR INSERTION N/A 03/10/2014   Procedure: IMPLANTATION OF SACRAL  NERVE STIMULATOR ;  Surgeon:  Leighton Ruff, MD;  Location: [facility];  Service: General;  Laterality: N/A;  Sacral. [employer]    Current Outpatient Medications  Medication Sig Dispense Refill  . colestipol (COLESTID) 1 g tablet Take 1 g by mouth 2 (two) times daily as needed.    . dicyclomine (BENTYL) 10 MG capsule Take 1 capsule (10 mg total) by mouth 2 (two) times daily. 60 capsule 1  . diphenhydrAMINE (BENADRYL) 25 mg capsule Take 25 mg by mouth at bedtime as needed for sleep.    . hydrochlorothiazide (HYDRODIURIL) 12.5 MG tablet Take 1 tablet (12.5 mg total) by mouth daily. (Patient taking differently: Take 12.5 mg by mouth every morning. )  30 tablet 11  . lisinopril (PRINIVIL,ZESTRIL) 10 MG tablet Take 1 tablet (10 mg total) by mouth daily. (Patient taking differently: Take 10 mg by mouth every morning. ) 90 tablet 1  . metoprolol succinate (TOPROL-XL) 25 MG 24 hr tablet TAKE 1 TABLET TWICE A DAY 180 tablet 3  . nitroGLYCERIN (NITROSTAT) 0.4 MG SL tablet DISSOLVE 1 TABLET UNDER THE TONGUE EVERY 5 MINUTES AS NEEDED FOR CHEST PAIN 75 tablet 3  . pantoprazole (PROTONIX) 40 MG tablet Take 1 tablet (40 mg total) by mouth 2 (two) times daily. 180 tablet 1  . PRADAXA 150 MG CAPS capsule TAKE 1 CAPSULE EVERY 12 HOURS (NEEDS CARDIOLOGY APPOINTMENT FOR FUTURE REFILLS) 180 capsule 1  . primidone (MYSOLINE) 50 MG tablet Take 2 tablets (100 mg total) by mouth at bedtime. 180 tablet 3  . rosuvastatin (CRESTOR) 40 MG tablet TAKE 1 TABLET DAILY 90 tablet 3  . traZODone (DESYREL) 50 MG tablet Take 150 mg by mouth at bedtime.      No current facility-administered medications for this visit.    Allergies:   Cephalexin   Social History:  The patient  reports that she quit smoking about 31 years ago. Her smoking use included cigarettes. She has a 10.00 pack-year smoking history. She has never used smokeless tobacco. She reports that she does not drink alcohol or use drugs.   Family History:  The patient's family  history includes Coronary artery disease in her father; Heart disease in her brother, mother, and sister.  ROS:  Please see the history of present illness.  All other systems are reviewed and otherwise negative.   PHYSICAL EXAM:  VS:  There were no vitals taken for this visit. BMI: There is no height or weight on file to calculate BMI. Well nourished, well developed, in no acute distress  HEENT: normocephalic, atraumatic  Neck: no JVD, carotid bruits or masses Cardiac:  RRR; no significant murmurs, no rubs, or gallops Lungs:  CTA b/l, no wheezing, rhonchi or rales  Abd: soft, nontender MS: no deformity or atrophy Ext:  no edema  Skin: warm and dry, no rash Neuro:  No gross deficits appreciated Psych: euthymic mood, full affect     EKG:  SR, no ST/T changes, no acute/significant changes from previous (reviewed with Dr. Acie Fredrickson (DOD)  04/03/17: TTE Study Conclusions - Left ventricle: Systolic function was normal. The estimated   ejection fraction was in the range of 60% to 65%. - Aortic valve: Mildly thickened, mildly calcified leaflets. - Mitral valve: Mild prolapse, involving the anterior leaflet.   There was mild regurgitation. Valve area by pressure half-time:   2.1 cm^2. - Left atrium: Volume/bsa, ES, (1-plane Simpson&'s, A2C): 20.2   ml/m^2.   06/16/13; Stress test Impression Exercise Capacity:  Lexiscan with no exercise. BP Response:  Normal blood pressure response. Clinical Symptoms:  No significant symptoms noted. ECG Impression:  No significant ST segment change suggestive of ischemia. Comparison with Prior Nuclear Study: No changes from  previous nuclear study performed 05/06/12.   Overall Impression:  Normal stress nuclear study. LV Ejection Fraction: 83%.  LV Wall Motion:  NL LV Function; NL Wall Motion.   05/09/2012: LHC Left mainstem: Short and normal  Left anterior descending (LAD): Courses to the apex and provides a moderate size diagonal.  There is  vessel calcification and mild luminal irregularity throughout.  Of note, there is a 30-40% ostial diagonal stenosis.  No high grade obstruction is noted.   Left circumflex (LCx): Provides a  ramus intermedius that bifurcates distally.  There is segmental plaque of probably 40-50% in the proximal mid junction.  It appears unchanged from the prior study.  The av circ has moderate calcification and a moderate size marginal branch, and an AV branch with 3 small PL branches.  No areas of high grade disease are noted.   Right coronary artery (RCA): The RCA has been previously stented.  The stent is widely patent.  There is about 40-50% smooth narrowing just after the stent, and this is slightly improved after IC TNG.  There is diffuse segmental plaque of 40% beyond this area that does not appear critical.  There is also 40-50% distal plaque as well. After NTG, this area opens as well.    Left ventriculography: Left ventricular systolic function is normal, LVEF is estimated at 55-65%, there is no significant mitral regurgitation   Final Conclusions:   1.  Continued patency of the RCA stent with some moderate plaque distally. 2.  Scattered moderate plaque in the ramus intermedius without critical narrowing   10/25/2010: LHC ANGIOGRAPHIC FINDINGS: 1. The left main coronary artery had no obstructive disease. 2. The left anterior descending was a large vessel that coursed to the     apex and gave off a moderate-sized diagonal branch.  The left     anterior descending artery had mild plaque disease throughout the     mid segment.  The diagonal branch had an ostial 30-40% stenosis.     None of the lesions appeared to be flow-limiting. 3. Circumflex artery was a moderate-sized vessel that gave off a small-     caliber obtuse marginal branch.  There were no flow-limiting     lesions noted in this vessel. 4. The right coronary artery is a moderate-sized vessel and has a     patent stent in the mid vessel  with minimal in-stent restenosis.     Just beyond the stented segment, there appears to be a 50-60%     stenosis.  Multiple views were taken of this lesion and it did not     appear to be flow-limiting. 5. Left ventricular angiogram was performed in the RAO projection     showed normal left ventricular systolic function.  IMPRESSION: 1. Single-vessel coronary artery disease with patent stent in the mid     right coronary artery. 2. Moderate disease in the mid right coronary artery beyond the     stented segment. 3. Normal left ventricular systolic function.   Recent Labs: No results found for requested labs within last 8760 hours.  No results found for requested labs within last 8760 hours.   CrCl cannot be calculated (Patient's most recent lab result is older than the maximum 21 days allowed.).   Wt Readings from Last 3 Encounters:  09/07/19 188 lb 12.8 oz (85.6 kg)  09/01/19 188 lb (85.3 kg)  05/12/19 189 lb (85.7 kg)     Other studies reviewed: Additional studies/records reviewed today include: summarized above  ASSESSMENT AND PLAN:  1. AFib     H/o PVI ablation remotely     CHA2DS2Vasc is 6, on Pradaxa, labs are monitored via her primary     No symptoms to suggest recurrent AF  2. CAD     On BB and high dose statin, has prn NTG available to her 3. DOE, CP Symptoms not like her previous anginal symptom, but certainly new and worrisome for angina She does not appear volume OL by exam In d/w DOD  today will plan for cath Will start ECASA 81mg  and Imdur 30mg  daily Soonest cath schedule available is Monday, she will hold her Pradaxa Sat and Sunday and Monday AM  Discussed if any escalation or change in the beahvior of her symptom to seek medical attention immediately  4. HTN     Looks great   5. HLD     On Crestor     Monitored and managed by her primary     Will request last labs/liid panel     Disposition: will also plan for echo when able, see her back in  2weeks, sooner if needed   ADDEND: I have called the patient she confirmed her identity by name and birthdate I discussed with the patient LHC, possible PCI  procedure with the patient, potential benefits and risks, she has had a number of caths historically and is agreeable to proceed. Her Creat 1.27 (stable for her) I have asked her not to take her HCTZ or lisinopril the morning of her procedure (in addition to holding her Pradaxa Saturday/Synday, Monday and until further instructions by Dr. [place] post procedure) She mentions her son will be driving her the day of her procedure, discussed she may go home same day but prepare for a possible overnight stay.    Current medicines are reviewed at length with the patient today.  The patient did not have any concerns regarding medicines.  Venetia Night, PA-C 10/18/2019 7:08 PM     [address] [place] [phone] (office)  [phone] (fax)

## 2019-10-18 NOTE — Progress Notes (Addendum)
Cardiology Office Note Date:  10/18/2019  Patient ID:  [name] 1945-07-05, MRN [id number] PCP:  Fransisca Connors, PA-C  Electrophysiologist:  Dr. Rayann Heman    Chief Complaint:  CP, DOE  History of Present Illness: [name] is a 74 y.o. female with history of CAD (DES to the RCA in 2010), HTN, HLD, DM, and AFib (s/p PVI ablation in 2012), essential tremor (follws with [facility])   She comes in today to be seen for Dr. Rayann Heman, last seen by him Sept 2019.  She was doing very well, traveling extensively.  No changes were made, she was encouraged life style modification for obesity, CAD, general health.   I saw her Oct 2020, she continued to do very well.  She was active in/around the house, cleaning and doing projects.  Denied any CP, palpitations, or SOB.  No exertional intolerances, no DOE.   She denied any symptoms of PND or orthopnea, no near syncope or syncope. She reported labs done twice this year via her primary, he also manages her lipids as well. She denied any bleeding or signs of bleeding on her pradaxa. No changes were made and planned for annual visit  TODAY She comes with progressive DOE and c/o CP.  She states around [employer] she started noticing she was getting tired easier then usual, getting winded with some of her usual activities, over the last couple months able to less and less without SOB.  In the last 2-3 weeks associiated with sharp twinges of CP.  She reports last week she got so weak after grocery shopping that her husband had to help her from the car, she was SOB.  BP 179/118 and eventually did settle down, not typically is ever that high. BP after ambulating in today was 118/64  She does not have rest SOB, no symptoms of orthopnea or PND.  She does not feel bloated or like she is retaining fluid.  She has not noticed any swelling of her LE.  Once she sits the SOB and CP do resolve, usually within a minute or so.  No near  syncope or syncope, no palpitations.  No bleeding or signs of bleeding, reports compliance with her pradaxa and all of her medicines.  No cough, fever, or symptoms of illness  She recalls her symptom prior to her stent as being L shoulder pain, went to her PMD for shoulder pain and was sent to the cardiologist.   No shoulder pain associated with these recent symptoms.  She does have a component of chronic shoulder pain that over the years has had injections for.     Past Medical History:  Diagnosis Date  . Anticoagulated    Pradaxa  . Coronary artery disease cardiologist-- dr Rayann Heman   a.  s/p Xience DES to RCA 04/2009;   b. TEE 2/12: EF 40%, Large PFO;  c.  Lexiscan Myoview 05/2012: EF 69%, no ischemia. LHC (05/2012):  Ostial diagonal 30-40%, proximal mid ramus intermedius 40-50%, RCA stent patent with 40-50% after stent, then 40%, distal RCA 40-50%, EF 55-65%. Medical therapy continued.;  d.  Carlton Adam Myoview (06/2013):  No ischemia, EF 83%, normal study  . Full dentures   . GERD (gastroesophageal reflux disease)   . History of basal cell carcinoma excision    scalp  . History of hiatal hernia   . History of kidney stones 51 years ago   x1  . History of recurrent UTIs   . Hypertension   .  IFG (impaired fasting glucose)   . MVP (mitral valve prolapse)    mild per last echo 04-03-2017 in epic  . OA (osteoarthritis)    knees , fingers  . PAF (paroxysmal atrial fibrillation) [facility])    cardiologist-- dr Rayann Heman  . S/P ablation of atrial fibrillation 08/15/2010  . S/P drug eluting coronary stent placement 04/04/2009  . Tremor   . Wears glasses     Past Surgical History:  Procedure Laterality Date  . ANAL RECTAL MANOMETRY N/A 01/11/2014   Procedure: ANAL RECTAL MANOMETRY;  Surgeon: Leighton Ruff, MD;  Location: [facility] ENDOSCOPY;  Service: Endoscopy;  Laterality: N/A;  . APPENDECTOMY  1973  . BACK SURGERY     laminectomy times 2; 2000 and 2001  . BUNIONECTOMY Bilateral 2013  .  CARDIAC CATHETERIZATION  10-24-2010   dr allred   singl-vessel CAD with patent stent mRCA/  moderate disease mRCA beyond stent segment/  normal lvsf  . CARDIAC ELECTROPHYSIOLOGY MAPPING AND ABLATION  08-15-2010  dr allred  . CARDIOVASCULAR STRESS TEST  06-15-2013  dr allred   normal perfusion study/  no ischemia/  ef 83%  . CARPAL TUNNEL RELEASE Right 25 years ago  . CHOLECYSTECTOMY OPEN  1973   W/  APPENDECTOMY  . CORONARY ANGIOPLASTY WITH STENT PLACEMENT  04-04-2009  dr Darnell Level brodie   PCI and DES x1 to  mRCA/  mLAD 40%, pCX 30%, pRCA 30%/  normal LVF  . FLEXIBLE SIGMOIDOSCOPY N/A 07/07/2013   Procedure: FLEXIBLE SIGMOIDOSCOPY;  Surgeon: Winfield Cunas., MD;  Location: [facility];  Service: Endoscopy;  Laterality: N/A;  unprepped  . LEFT HEART CATHETERIZATION WITH CORONARY ANGIOGRAM N/A 05/09/2012   Procedure: LEFT HEART CATHETERIZATION WITH CORONARY ANGIOGRAM;  Surgeon: Hillary Bow, MD;  Location: [facility] CATH LAB;  Service: Cardiovascular;  Laterality: N/A;  . MOHS SURGERY  2009   scalp  . PILONIDAL CYST EXCISION  2009;   2000;   1999  . RECTAL ULTRASOUND N/A 01/11/2014   Procedure: RECTAL ULTRASOUND;  Surgeon: Leighton Ruff, MD;  Location: [facility];  Service: Endoscopy;  Laterality: N/A;  . TOTAL KNEE ARTHROPLASTY Right 09/07/2014   Procedure: RIGHT TOTAL KNEE ARTHROPLASTY;  Surgeon: Paralee Cancel, MD;  Location: [facility];  Service: Orthopedics;  Laterality: Right;  . TOTAL KNEE ARTHROPLASTY Left 12/20/2015   Procedure: LEFT TOTAL KNEE ARTHROPLASTY;  Surgeon: Paralee Cancel, MD;  Location: [facility];  Service: Orthopedics;  Laterality: Left;  . TRANSTHORACIC ECHOCARDIOGRAM  04-03-2017   dr allred   Leory Plowman 55-60%/  mild MVP anterior leaflet (valve area 2.1cm^2) with mild regurg. , no stenosis/  trivial TR and PR  . VAGINAL HYSTERECTOMY  1975   W/ BILATERAL SALPINOOPHOORECTOMY  . VAGUS NERVE STIMULATOR INSERTION N/A 03/10/2014   Procedure: IMPLANTATION OF SACRAL  NERVE STIMULATOR ;  Surgeon:  Leighton Ruff, MD;  Location: [facility];  Service: General;  Laterality: N/A;  Sacral. [employer]    Current Outpatient Medications  Medication Sig Dispense Refill  . colestipol (COLESTID) 1 g tablet Take 1 g by mouth 2 (two) times daily as needed.    . dicyclomine (BENTYL) 10 MG capsule Take 1 capsule (10 mg total) by mouth 2 (two) times daily. 60 capsule 1  . diphenhydrAMINE (BENADRYL) 25 mg capsule Take 25 mg by mouth at bedtime as needed for sleep.    . hydrochlorothiazide (HYDRODIURIL) 12.5 MG tablet Take 1 tablet (12.5 mg total) by mouth daily. (Patient taking differently: Take 12.5 mg by mouth every morning. )  30 tablet 11  . lisinopril (PRINIVIL,ZESTRIL) 10 MG tablet Take 1 tablet (10 mg total) by mouth daily. (Patient taking differently: Take 10 mg by mouth every morning. ) 90 tablet 1  . metoprolol succinate (TOPROL-XL) 25 MG 24 hr tablet TAKE 1 TABLET TWICE A DAY 180 tablet 3  . nitroGLYCERIN (NITROSTAT) 0.4 MG SL tablet DISSOLVE 1 TABLET UNDER THE TONGUE EVERY 5 MINUTES AS NEEDED FOR CHEST PAIN 75 tablet 3  . pantoprazole (PROTONIX) 40 MG tablet Take 1 tablet (40 mg total) by mouth 2 (two) times daily. 180 tablet 1  . PRADAXA 150 MG CAPS capsule TAKE 1 CAPSULE EVERY 12 HOURS (NEEDS CARDIOLOGY APPOINTMENT FOR FUTURE REFILLS) 180 capsule 1  . primidone (MYSOLINE) 50 MG tablet Take 2 tablets (100 mg total) by mouth at bedtime. 180 tablet 3  . rosuvastatin (CRESTOR) 40 MG tablet TAKE 1 TABLET DAILY 90 tablet 3  . traZODone (DESYREL) 50 MG tablet Take 150 mg by mouth at bedtime.      No current facility-administered medications for this visit.    Allergies:   Cephalexin   Social History:  The patient  reports that she quit smoking about 31 years ago. Her smoking use included cigarettes. She has a 10.00 pack-year smoking history. She has never used smokeless tobacco. She reports that she does not drink alcohol or use drugs.   Family History:  The patient's family  history includes Coronary artery disease in her father; Heart disease in her brother, mother, and sister.  ROS:  Please see the history of present illness.  All other systems are reviewed and otherwise negative.   PHYSICAL EXAM:  VS:  There were no vitals taken for this visit. BMI: There is no height or weight on file to calculate BMI. Well nourished, well developed, in no acute distress  HEENT: normocephalic, atraumatic  Neck: no JVD, carotid bruits or masses Cardiac:  RRR; no significant murmurs, no rubs, or gallops Lungs:  CTA b/l, no wheezing, rhonchi or rales  Abd: soft, nontender MS: no deformity or atrophy Ext:  no edema  Skin: warm and dry, no rash Neuro:  No gross deficits appreciated Psych: euthymic mood, full affect     EKG:  SR, no ST/T changes, no acute/significant changes from previous (reviewed with Dr. Acie Fredrickson (DOD)  04/03/17: TTE Study Conclusions - Left ventricle: Systolic function was normal. The estimated   ejection fraction was in the range of 60% to 65%. - Aortic valve: Mildly thickened, mildly calcified leaflets. - Mitral valve: Mild prolapse, involving the anterior leaflet.   There was mild regurgitation. Valve area by pressure half-time:   2.1 cm^2. - Left atrium: Volume/bsa, ES, (1-plane Simpson&'s, A2C): 20.2   ml/m^2.   06/16/13; Stress test Impression Exercise Capacity:  Lexiscan with no exercise. BP Response:  Normal blood pressure response. Clinical Symptoms:  No significant symptoms noted. ECG Impression:  No significant ST segment change suggestive of ischemia. Comparison with Prior Nuclear Study: No changes from  previous nuclear study performed 05/06/12.   Overall Impression:  Normal stress nuclear study. LV Ejection Fraction: 83%.  LV Wall Motion:  NL LV Function; NL Wall Motion.   05/09/2012: LHC Left mainstem: Short and normal  Left anterior descending (LAD): Courses to the apex and provides a moderate size diagonal.  There is  vessel calcification and mild luminal irregularity throughout.  Of note, there is a 30-40% ostial diagonal stenosis.  No high grade obstruction is noted.   Left circumflex (LCx): Provides a  ramus intermedius that bifurcates distally.  There is segmental plaque of probably 40-50% in the proximal mid junction.  It appears unchanged from the prior study.  The av circ has moderate calcification and a moderate size marginal branch, and an AV branch with 3 small PL branches.  No areas of high grade disease are noted.   Right coronary artery (RCA): The RCA has been previously stented.  The stent is widely patent.  There is about 40-50% smooth narrowing just after the stent, and this is slightly improved after IC TNG.  There is diffuse segmental plaque of 40% beyond this area that does not appear critical.  There is also 40-50% distal plaque as well. After NTG, this area opens as well.    Left ventriculography: Left ventricular systolic function is normal, LVEF is estimated at 55-65%, there is no significant mitral regurgitation   Final Conclusions:   1.  Continued patency of the RCA stent with some moderate plaque distally. 2.  Scattered moderate plaque in the ramus intermedius without critical narrowing   10/25/2010: LHC ANGIOGRAPHIC FINDINGS: 1. The left main coronary artery had no obstructive disease. 2. The left anterior descending was a large vessel that coursed to the     apex and gave off a moderate-sized diagonal branch.  The left     anterior descending artery had mild plaque disease throughout the     mid segment.  The diagonal branch had an ostial 30-40% stenosis.     None of the lesions appeared to be flow-limiting. 3. Circumflex artery was a moderate-sized vessel that gave off a small-     caliber obtuse marginal branch.  There were no flow-limiting     lesions noted in this vessel. 4. The right coronary artery is a moderate-sized vessel and has a     patent stent in the mid vessel  with minimal in-stent restenosis.     Just beyond the stented segment, there appears to be a 50-60%     stenosis.  Multiple views were taken of this lesion and it did not     appear to be flow-limiting. 5. Left ventricular angiogram was performed in the RAO projection     showed normal left ventricular systolic function.  IMPRESSION: 1. Single-vessel coronary artery disease with patent stent in the mid     right coronary artery. 2. Moderate disease in the mid right coronary artery beyond the     stented segment. 3. Normal left ventricular systolic function.   Recent Labs: No results found for requested labs within last 8760 hours.  No results found for requested labs within last 8760 hours.   CrCl cannot be calculated (Patient's most recent lab result is older than the maximum 21 days allowed.).   Wt Readings from Last 3 Encounters:  09/07/19 188 lb 12.8 oz (85.6 kg)  09/01/19 188 lb (85.3 kg)  05/12/19 189 lb (85.7 kg)     Other studies reviewed: Additional studies/records reviewed today include: summarized above  ASSESSMENT AND PLAN:  1. AFib     H/o PVI ablation remotely     CHA2DS2Vasc is 6, on Pradaxa, labs are monitored via her primary     No symptoms to suggest recurrent AF  2. CAD     On BB and high dose statin, has prn NTG available to her 3. DOE, CP Symptoms not like her previous anginal symptom, but certainly new and worrisome for angina She does not appear volume OL by exam In d/w DOD  today will plan for cath Will start ECASA 81mg  and Imdur 30mg  daily Soonest cath schedule available is Monday, she will hold her Pradaxa Sat and Sunday and Monday AM  Discussed if any escalation or change in the beahvior of her symptom to seek medical attention immediately  4. HTN     Looks great   5. HLD     On Crestor     Monitored and managed by her primary     Will request last labs/liid panel     Disposition: will also plan for echo when able, see her back in  2weeks, sooner if needed   ADDEND: I have called the patient she confirmed her identity by name and birthdate I discussed with the patient LHC, possible PCI  procedure with the patient, potential benefits and risks, she has had a number of caths historically and is agreeable to proceed. Her Creat 1.27 (stable for her) I have asked her not to take her HCTZ or lisinopril the morning of her procedure (in addition to holding her Pradaxa Saturday/Synday, Monday and until further instructions by Dr. Martinique post procedure) She mentions her son will be driving her the day of her procedure, discussed she may go home same day but prepare for a possible overnight stay.    Current medicines are reviewed at length with the patient today.  The patient did not have any concerns regarding medicines.  Venetia Night, PA-C 10/18/2019 7:08 PM     [place] [place] [address] [phone] (office)  [phone] (fax)

## 2019-10-21 ENCOUNTER — Other Ambulatory Visit: Payer: Self-pay

## 2019-10-21 ENCOUNTER — Encounter: Payer: Self-pay | Admitting: *Deleted

## 2019-10-21 ENCOUNTER — Telehealth: Payer: Self-pay | Admitting: Cardiology

## 2019-10-21 ENCOUNTER — Ambulatory Visit (INDEPENDENT_AMBULATORY_CARE_PROVIDER_SITE_OTHER): Payer: Medicare Other | Admitting: Physician Assistant

## 2019-10-21 VITALS — BP 118/64 | HR 68 | Ht 62.0 in | Wt 189.0 lb

## 2019-10-21 DIAGNOSIS — I10 Essential (primary) hypertension: Secondary | ICD-10-CM | POA: Diagnosis not present

## 2019-10-21 DIAGNOSIS — I2510 Atherosclerotic heart disease of native coronary artery without angina pectoris: Secondary | ICD-10-CM

## 2019-10-21 DIAGNOSIS — I480 Paroxysmal atrial fibrillation: Secondary | ICD-10-CM

## 2019-10-21 DIAGNOSIS — R079 Chest pain, unspecified: Secondary | ICD-10-CM | POA: Diagnosis not present

## 2019-10-21 DIAGNOSIS — E785 Hyperlipidemia, unspecified: Secondary | ICD-10-CM

## 2019-10-21 LAB — COMPREHENSIVE METABOLIC PANEL
ALT: 11 IU/L (ref 0–32)
AST: 15 IU/L (ref 0–40)
Albumin/Globulin Ratio: 2.2 (ref 1.2–2.2)
Albumin: 4.7 g/dL (ref 3.7–4.7)
Alkaline Phosphatase: 81 IU/L (ref 39–117)
BUN/Creatinine Ratio: 17 (ref 12–28)
BUN: 21 mg/dL (ref 8–27)
Bilirubin Total: 0.3 mg/dL (ref 0.0–1.2)
CO2: 30 mmol/L — ABNORMAL HIGH (ref 20–29)
Calcium: 9.8 mg/dL (ref 8.7–10.3)
Chloride: 94 mmol/L — ABNORMAL LOW (ref 96–106)
Creatinine, Ser: 1.27 mg/dL — ABNORMAL HIGH (ref 0.57–1.00)
GFR calc Af Amer: 48 mL/min/{1.73_m2} — ABNORMAL LOW (ref >59)
GFR calc non Af Amer: 41 mL/min/{1.73_m2} — ABNORMAL LOW (ref >59)
Globulin, Total: 2.1 g/dL (ref 1.5–4.5)
Glucose: 116 mg/dL — ABNORMAL HIGH (ref 65–99)
Potassium: 3.5 mmol/L (ref 3.5–5.2)
Sodium: 136 mmol/L (ref 134–144)
Total Protein: 6.8 g/dL (ref 6.0–8.5)

## 2019-10-21 LAB — CBC
Hematocrit: 40.6 % (ref 34.0–46.6)
Hemoglobin: 14 g/dL (ref 11.1–15.9)
MCH: 32 pg (ref 26.6–33.0)
MCHC: 34.5 g/dL (ref 31.5–35.7)
MCV: 93 fL (ref 79–97)
Platelets: 191 10*3/uL (ref 150–450)
RBC: 4.37 x10E6/uL (ref 3.77–5.28)
RDW: 14.6 % (ref 11.7–15.4)
WBC: 8.3 10*3/uL (ref 3.4–10.8)

## 2019-10-21 MED ORDER — ISOSORBIDE MONONITRATE ER 30 MG PO TB24: 30.0000 mg | ORAL_TABLET | Freq: Every day | ORAL | 0 refills | Status: DC

## 2019-10-21 NOTE — Patient Instructions (Addendum)
Medication Instructions:  START TAKING TODAY : ASPIRIN 81 MG ONCE A DAY                                                ISOSORBIDE ( IMDUR) 30 MG ONCE A DAY   STOP TAKING:  PRADAXA  Saturday Sunday and morning of procedure YOUR HEART CATHERIZATION  AND CONTINUE AFTER  *If you need a refill on your cardiac medications before your next appointment, please call your pharmacy*   Lab Work:.CMET AND CBC TODAY   Covid screen [place] location 10-22-19   If you have labs (blood work) drawn today and your tests are completely normal, you will receive your results only by: [name] MyChart Message (if you have MyChart) OR . A paper copy in the mail If you have any lab test that is abnormal or we need to change your treatment, we will call you to review the results.   Testing/Procedures:  SEE LETTER FOR LEFT HEART CATH.Your physician has requested that you have a cardiac catheterization. Cardiac catheterization is used to diagnose and/or treat various heart conditions. Doctors may recommend this procedure for a number of different reasons. The most common reason is to evaluate chest pain. Chest pain can be a symptom of coronary artery disease (CAD), and cardiac catheterization can show whether plaque is narrowing or blocking your heart's arteries. This procedure is also used to evaluate the valves, as well as measure the blood flow and oxygen levels in different parts of your heart. For further information please visit [email or web address].[email or web address]. Please follow instruction sheet, as given.  Your physician has requested that you have an echocardiogram. Echocardiography is a painless test that uses sound waves to create images of your heart. It provides your doctor with information about the size and shape of your heart and how well your heart's chambers and valves are working. This procedure takes approximately one hour. There are no restrictions for this procedure.   Follow-Up: At [facility], you and your  health needs are our priority.  As part of our continuing mission to provide you with exceptional heart care, we have created designated Provider Care Teams.  These Care Teams include your primary Cardiologist (physician) and Advanced Practice Providers (APPs -  Physician Assistants and Nurse Practitioners) who all work together to provide you with the care you need, when you need it.  We recommend signing up for the patient portal called "MyChart".  Sign up information is provided on this After Visit Summary.  MyChart is used to connect with patients for Virtual Visits (Telemedicine).  Patients are able to view lab/test results, encounter notes, upcoming appointments, etc.  Non-urgent messages can be sent to your provider as well.   To learn more about what you can do with MyChart, go to [email or web address].    Your next appointment:   2 week(s)  The format for your next appointment:   In Person  Provider:   You may see Allred  or one of the following Advanced Practice Providers on your designated Care Team:    Chanetta Marshall, NP  Tommye Standard, PA-C  Legrand Como "Oda Kilts, [place]    Other Instructions

## 2019-10-21 NOTE — Telephone Encounter (Signed)
Left message to call office

## 2019-10-21 NOTE — Telephone Encounter (Signed)
Left message to call back  

## 2019-10-21 NOTE — Telephone Encounter (Signed)
Patient states she has questions in regards to whether or not her son is required to accompany her during heart cath scheduled for 10/26/19. Please call to discuss.

## 2019-10-21 NOTE — Telephone Encounter (Signed)
[name] is returning [name]'s phone call. She states if she is unable to answer to please leave a detailed message so she doesn't have to keep calling back. Her phone has a built in scam filter that sends our offices call straight to VM instead of ringing.

## 2019-10-22 ENCOUNTER — Other Ambulatory Visit (HOSPITAL_COMMUNITY)
Admission: RE | Admit: 2019-10-22 | Discharge: 2019-10-22 | Disposition: A | Discharge status: Home / Self Care | Payer: Medicare Other | Source: Ambulatory Visit | Attending: Cardiology | Admitting: Cardiology

## 2019-10-22 DIAGNOSIS — Z01812 Encounter for preprocedural laboratory examination: Secondary | ICD-10-CM | POA: Diagnosis present

## 2019-10-22 DIAGNOSIS — Z20822 Contact with and (suspected) exposure to covid-19: Secondary | ICD-10-CM | POA: Diagnosis not present

## 2019-10-22 LAB — SARS CORONAVIRUS 2 (TAT 6-24 HRS): SARS Coronavirus 2: NEGATIVE

## 2019-10-22 NOTE — Telephone Encounter (Signed)
Returned call to patient left message on personal voice mail ok for your son to go with you at your cath appointment.Advised to call me back if you have any questions.

## 2019-10-26 ENCOUNTER — Ambulatory Visit (HOSPITAL_COMMUNITY)
Admission: RE | Admit: 2019-10-26 | Discharge: 2019-10-26 | Disposition: A | Discharge status: Home / Self Care | Payer: Medicare Other | Attending: Cardiology | Admitting: Cardiology

## 2019-10-26 ENCOUNTER — Other Ambulatory Visit: Payer: Self-pay

## 2019-10-26 ENCOUNTER — Encounter (HOSPITAL_COMMUNITY)
Admission: RE | Disposition: A | Discharge status: Home / Self Care | Payer: Medicare Other | Source: Home / Self Care | Attending: Cardiology

## 2019-10-26 DIAGNOSIS — I341 Nonrheumatic mitral (valve) prolapse: Secondary | ICD-10-CM | POA: Diagnosis not present

## 2019-10-26 DIAGNOSIS — Z8249 Family history of ischemic heart disease and other diseases of the circulatory system: Secondary | ICD-10-CM | POA: Insufficient documentation

## 2019-10-26 DIAGNOSIS — E669 Obesity, unspecified: Secondary | ICD-10-CM | POA: Diagnosis not present

## 2019-10-26 DIAGNOSIS — Z881 Allergy status to other antibiotic agents status: Secondary | ICD-10-CM | POA: Insufficient documentation

## 2019-10-26 DIAGNOSIS — M1990 Unspecified osteoarthritis, unspecified site: Secondary | ICD-10-CM | POA: Diagnosis not present

## 2019-10-26 DIAGNOSIS — I10 Essential (primary) hypertension: Secondary | ICD-10-CM | POA: Diagnosis not present

## 2019-10-26 DIAGNOSIS — Z87891 Personal history of nicotine dependence: Secondary | ICD-10-CM | POA: Insufficient documentation

## 2019-10-26 DIAGNOSIS — E785 Hyperlipidemia, unspecified: Secondary | ICD-10-CM | POA: Diagnosis not present

## 2019-10-26 DIAGNOSIS — Z7901 Long term (current) use of anticoagulants: Secondary | ICD-10-CM | POA: Diagnosis not present

## 2019-10-26 DIAGNOSIS — I200 Unstable angina: Secondary | ICD-10-CM | POA: Diagnosis present

## 2019-10-26 DIAGNOSIS — Z96653 Presence of artificial knee joint, bilateral: Secondary | ICD-10-CM | POA: Insufficient documentation

## 2019-10-26 DIAGNOSIS — Z6834 Body mass index (BMI) 34.0-34.9, adult: Secondary | ICD-10-CM | POA: Insufficient documentation

## 2019-10-26 DIAGNOSIS — K219 Gastro-esophageal reflux disease without esophagitis: Secondary | ICD-10-CM | POA: Diagnosis not present

## 2019-10-26 DIAGNOSIS — R079 Chest pain, unspecified: Secondary | ICD-10-CM | POA: Diagnosis present

## 2019-10-26 DIAGNOSIS — I480 Paroxysmal atrial fibrillation: Secondary | ICD-10-CM | POA: Insufficient documentation

## 2019-10-26 DIAGNOSIS — E119 Type 2 diabetes mellitus without complications: Secondary | ICD-10-CM | POA: Insufficient documentation

## 2019-10-26 DIAGNOSIS — Z79899 Other long term (current) drug therapy: Secondary | ICD-10-CM | POA: Diagnosis not present

## 2019-10-26 DIAGNOSIS — E781 Pure hyperglyceridemia: Secondary | ICD-10-CM | POA: Diagnosis present

## 2019-10-26 DIAGNOSIS — G250 Essential tremor: Secondary | ICD-10-CM | POA: Diagnosis not present

## 2019-10-26 DIAGNOSIS — Z955 Presence of coronary angioplasty implant and graft: Secondary | ICD-10-CM | POA: Diagnosis not present

## 2019-10-26 DIAGNOSIS — I2510 Atherosclerotic heart disease of native coronary artery without angina pectoris: Secondary | ICD-10-CM | POA: Diagnosis not present

## 2019-10-26 DIAGNOSIS — I4891 Unspecified atrial fibrillation: Secondary | ICD-10-CM | POA: Diagnosis present

## 2019-10-26 HISTORY — PX: LEFT HEART CATH AND CORONARY ANGIOGRAPHY: CATH118249

## 2019-10-26 LAB — GLUCOSE, CAPILLARY: Glucose-Capillary: 114 mg/dL — ABNORMAL HIGH (ref 70–99)

## 2019-10-26 SURGERY — LEFT HEART CATH AND CORONARY ANGIOGRAPHY
Anesthesia: LOCAL

## 2019-10-26 MED ORDER — SODIUM CHLORIDE 0.9 % IV SOLN: 250.0000 mL | INTRAVENOUS | Status: DC | PRN

## 2019-10-26 MED ORDER — FENTANYL CITRATE (PF) 100 MCG/2ML IJ SOLN
INTRAMUSCULAR | Status: DC | PRN
  Administered 2019-10-26: 25 ug via INTRAVENOUS

## 2019-10-26 MED ORDER — MIDAZOLAM HCL 2 MG/2ML IJ SOLN
INTRAMUSCULAR | Status: AC
  Filled 2019-10-26: qty 2

## 2019-10-26 MED ORDER — FENTANYL CITRATE (PF) 100 MCG/2ML IJ SOLN
INTRAMUSCULAR | Status: AC
  Filled 2019-10-26: qty 2

## 2019-10-26 MED ORDER — HEPARIN SODIUM (PORCINE) 1000 UNIT/ML IJ SOLN
INTRAMUSCULAR | Status: DC | PRN
  Administered 2019-10-26: 4500 [IU] via INTRAVENOUS

## 2019-10-26 MED ORDER — VERAPAMIL HCL 2.5 MG/ML IV SOLN
INTRAVENOUS | Status: AC
  Filled 2019-10-26: qty 2

## 2019-10-26 MED ORDER — SODIUM CHLORIDE 0.9% FLUSH: 3.0000 mL | Freq: Two times a day (BID) | INTRAVENOUS | Status: DC

## 2019-10-26 MED ORDER — SODIUM CHLORIDE 0.9 % WEIGHT BASED INFUSION
3.0000 mL/kg/h | INTRAVENOUS | Status: AC
  Administered 2019-10-26: 08:00:00 3 mL/kg/h via INTRAVENOUS

## 2019-10-26 MED ORDER — DABIGATRAN ETEXILATE MESYLATE 150 MG PO CAPS: 150.0000 mg | ORAL_CAPSULE | Freq: Two times a day (BID) | ORAL | Status: DC

## 2019-10-26 MED ORDER — VERAPAMIL HCL 2.5 MG/ML IV SOLN
INTRAVENOUS | Status: DC | PRN
  Administered 2019-10-26: 09:00:00 10 mL via INTRA_ARTERIAL

## 2019-10-26 MED ORDER — LIDOCAINE HCL (PF) 1 % IJ SOLN
INTRAMUSCULAR | Status: DC | PRN
  Administered 2019-10-26: 2 mL via INTRADERMAL

## 2019-10-26 MED ORDER — HEPARIN (PORCINE) IN NACL 1000-0.9 UT/500ML-% IV SOLN
INTRAVENOUS | Status: AC
  Filled 2019-10-26: qty 1000

## 2019-10-26 MED ORDER — HEPARIN SODIUM (PORCINE) 1000 UNIT/ML IJ SOLN
INTRAMUSCULAR | Status: AC
  Filled 2019-10-26: qty 1

## 2019-10-26 MED ORDER — MIDAZOLAM HCL 2 MG/2ML IJ SOLN
INTRAMUSCULAR | Status: DC | PRN
  Administered 2019-10-26: 1 mg via INTRAVENOUS

## 2019-10-26 MED ORDER — HEPARIN (PORCINE) IN NACL 1000-0.9 UT/500ML-% IV SOLN
INTRAVENOUS | Status: DC | PRN
  Administered 2019-10-26 (×2): 500 mL

## 2019-10-26 MED ORDER — SODIUM CHLORIDE 0.9% FLUSH: 3.0000 mL | INTRAVENOUS | Status: DC | PRN

## 2019-10-26 MED ORDER — IOHEXOL 350 MG/ML SOLN
INTRAVENOUS | Status: DC | PRN
  Administered 2019-10-26: 40 mL

## 2019-10-26 MED ORDER — ASPIRIN 81 MG PO CHEW: 81.0000 mg | CHEWABLE_TABLET | ORAL | Status: DC

## 2019-10-26 MED ORDER — SODIUM CHLORIDE 0.9 % WEIGHT BASED INFUSION: 1.0000 mL/kg/h | INTRAVENOUS | Status: DC

## 2019-10-26 MED ORDER — LIDOCAINE HCL (PF) 1 % IJ SOLN
INTRAMUSCULAR | Status: AC
  Filled 2019-10-26: qty 30

## 2019-10-26 MED ORDER — ONDANSETRON HCL 4 MG/2ML IJ SOLN: 4.0000 mg | Freq: Four times a day (QID) | INTRAMUSCULAR | Status: DC | PRN

## 2019-10-26 MED ORDER — ACETAMINOPHEN 325 MG PO TABS: 650.0000 mg | ORAL_TABLET | ORAL | Status: DC | PRN

## 2019-10-26 SURGICAL SUPPLY — 10 items
CATH 5FR JL3.5 JR4 ANG PIG MP (CATHETERS) ×2 IMPLANT
DEVICE RAD COMP TR BAND LRG (VASCULAR PRODUCTS) ×2 IMPLANT
GLIDESHEATH SLEND SS 6F .021 (SHEATH) ×2 IMPLANT
GUIDEWIRE INQWIRE 1.5J.035X260 (WIRE) ×1 IMPLANT
INQWIRE 1.5J .035X260CM (WIRE) ×2
KIT HEART LEFT (KITS) ×2 IMPLANT
PACK CARDIAC CATHETERIZATION (CUSTOM PROCEDURE TRAY) ×2 IMPLANT
SHEATH PROBE COVER 6X72 (BAG) ×2 IMPLANT
TRANSDUCER W/STOPCOCK (MISCELLANEOUS) ×2 IMPLANT
TUBING CIL FLEX 10 FLL-RA (TUBING) ×2 IMPLANT

## 2019-10-26 NOTE — Discharge Instructions (Signed)
DRINK PLENTY OF FLUIDS FOR THE NEXT 2-3 DAYS.  KEEP ARM ELEVATED THE REMAINDER OF THE DAY.  Radial Site Care  This sheet gives you information about how to care for yourself after your procedure. Your health care provider may also give you more specific instructions. If you have problems or questions, contact your health care provider. What can I expect after the procedure? After the procedure, it is common to have:  Bruising and tenderness at the catheter insertion area. Follow these instructions at home: Medicines  Take over-the-counter and prescription medicines only as told by your health care provider. Insertion site care 1. Follow instructions from your health care provider about how to take care of your insertion site. Make sure you: ? Wash your hands with soap and water before you change your bandage (dressing). If soap and water are not available, use hand sanitizer. ? Change your dressing as told by your health care provider. 2. Check your insertion site every day for signs of infection. Check for: ? Redness, swelling, or pain. ? Fluid or blood. ? Pus or a bad smell. ? Warmth. 3. Do not take baths, swim, or use a hot tub for 5 days. 4. You may shower 24-48 hours after the procedure. ? Remove the dressing and gently wash the site with plain soap and water. ? Pat the area dry with a clean towel. ? Do not rub the site. That could cause bleeding. 5. Do not apply powder or lotion to the site. Activity  1. For 24 hours after the procedure, or as directed by your health care provider: ? Do not flex or bend the affected arm. ? Do not push or pull heavy objects with the affected arm. ? Do not drive yourself home from the hospital or clinic. You may drive 24 hours after the procedure. ? Do not operate machinery or power tools. 2. Do not push, pull or lift anything that is heavier than 10 lb for 5 days. 3. Ask your health care provider when it is okay to: ? Return to work or  school. ? Resume usual physical activities or sports. ? Resume sexual activity. General instructions  If the catheter site starts to bleed, raise your arm and put firm pressure on the site. If the bleeding does not stop, get help right away. This is a medical emergency.  If you went home on the same day as your procedure, a responsible adult should be with you for the first 24 hours after you arrive home.  Keep all follow-up visits as told by your health care provider. This is important. Contact a health care provider if:  You have a fever.  You have redness, swelling, or yellow drainage around your insertion site. Get help right away if:  You have unusual pain at the radial site.  The catheter insertion area swells very fast.  The insertion area is bleeding, and the bleeding does not stop when you hold steady pressure on the area.  Your arm or hand becomes pale, cool, tingly, or numb. These symptoms may represent a serious problem that is an emergency. Do not wait to see if the symptoms will go away. Get medical help right away. Call your local emergency services (911 in the [place] Do not drive yourself to the hospital. Summary  After the procedure, it is common to have bruising and tenderness at the site.  Follow instructions from your health care provider about how to take care of your radial site wound. Check   the wound every day for signs of infection.  Do not push, pull or lift anything that is heavier than 10 lb for 5 days.  This information is not intended to replace advice given to you by your health care provider. Make sure you discuss any questions you have with your health care provider. Document Revised: 07/24/2017 Document Reviewed: 07/24/2017 Elsevier Patient Education  2020 [employer] Inc[employer] 

## 2019-10-26 NOTE — Interval H&P Note (Signed)
History and Physical Interval Note:  10/26/2019 8:25 AM  [name]  has presented today for surgery, with the diagnosis of chest pain.  The various methods of treatment have been discussed with the patient and family. After consideration of risks, benefits and other options for treatment, the patient has consented to  Procedure(s): LEFT HEART CATH AND CORONARY ANGIOGRAPHY (N/A) as a surgical intervention.  The patient's history has been reviewed, patient examined, no change in status, stable for surgery.  I have reviewed the patient's chart and labs.  Questions were answered to the patient's satisfaction.   Cath Lab Visit (complete for each Cath Lab visit)  Clinical Evaluation Leading to the Procedure:   ACS: No.  Non-ACS:    Anginal Classification: CCS III  Anti-ischemic medical therapy: Maximal Therapy (2 or more classes of medications)  Non-Invasive Test Results: No non-invasive testing performed  Prior CABG: No previous CABG        Collier [facility] 10/26/2019 8:25 AM

## 2019-11-05 ENCOUNTER — Ambulatory Visit (HOSPITAL_COMMUNITY): Payer: Medicare Other | Attending: Cardiovascular Disease

## 2019-11-05 ENCOUNTER — Other Ambulatory Visit: Payer: Self-pay

## 2019-11-05 DIAGNOSIS — R079 Chest pain, unspecified: Secondary | ICD-10-CM | POA: Diagnosis not present

## 2019-11-08 NOTE — Progress Notes (Signed)
Cardiology Office Note Date:  11/09/2019  Patient ID:  [name], [name] 11-01-1945, MRN [id number] PCP:  Fransisca Connors, PA-C  Electrophysiologist:  Dr. Rayann Heman    Chief Complaint:  Planned follow up  History of Present Illness: [name] is a 74 y.o. female with history of CAD (DES to the RCA in 2010), HTN, HLD, DM, and AFib (s/p PVI ablation in 2012), essential tremor (follws with [facility])   She comes in today to be seen for Dr. Rayann Heman, last seen by him Sept 2019.  She was doing very well, traveling extensively.  No changes were made, she was encouraged life style modification for obesity, CAD, general health.   I saw her Oct 2020, she continued to do very well.  She was active in/around the house, cleaning and doing projects.  Denied any CP, palpitations, or SOB.  No exertional intolerances, no DOE.   She denied any symptoms of PND or orthopnea, no near syncope or syncope. She reported labs done twice this year via her primary, he also manages her lipids as well. She denied any bleeding or signs of bleeding on her pradaxa. No changes were made and planned for annual visit  10/21/19 I saw her, she had c/o progressive DOE and c/o CP.  She stated around Christmas/new Year's she started noticing she was getting tired easier then usual, getting winded with some of her usual activities, over the last couple months able to less and less without SOB.  In the last 2-3 weeks associiated with sharp twinges of CP.  She reported last week she got so weak after grocery shopping that her husband had to help her from the car, she was SOB.  BP 179/118 and eventually did settle down, not typically is ever that high. BP after ambulating in today was 118/64  She denied rest SOB, no symptoms of orthopnea or PND.  She did not feel bloated or like she is retaining fluid. She had not noticed any swelling of her LE.  Once she says the SOB and CP do resolve, usually within a minute or  so. No near syncope or syncope, no palpitations. No bleeding or signs of bleeding, reports compliance with her pradaxa and all of her medicines. No cough, fever, or symptoms of illness She recalled her symptom prior to her stent as being L shoulder pain, went to her PMD for shoulder pain and was sent to the cardiologist.   No shoulder pain associated with these recent symptoms.  She does have a component of chronic shoulder pain that over the years has had injections for. Given clear change in exertional capacity, DOE recommended updating echo and cath.  Her LHC noted no obstructive CAD, normal LVEDP and LV function.  TTE noted LVEF 55-60%, DDI, no significant VHD. H/Hwas good.  TODAY She is frustrated, says that she has felt like this for years and no one can seem to figure out why.  She says she was seen by a pulmonologist 8 years ago without abnormal findings.  That was after an episode when walking that she got so SOB that she fainted.  It was my impression at her last visit that her symptoms had acutely worsened though she says no, that intermittently seems to flare up like it had. She is also frustrated that she is on so many medicines.  Mentions she knows people who have had vale surgeries, bypasses and are not on nearly the number of pills she is. She says  that every time she turns around there is another pill being added  And would like her regime simplified.  It seems perhaps cost may be a factor to some degree.  She says 3 years ago the HCTZ was started because she got swollen angles on their car trips. They no longer take car trips and wants it stopped.  She denies any bleeding or signs of bleeding with her pradaxa.  She does not think she has had any AFib since her ablation procedure, and her symptoms SOB is nothing like her AFib symptoms were.  She mentions her husband has early dementia, this has been weighing on her/them.       Past Medical History:  Diagnosis Date  .  Anticoagulated    Pradaxa  . Coronary artery disease cardiologist-- dr Rayann Heman   a.  s/p Xience DES to RCA 04/2009;   b. TEE 2/12: EF 40%, Large PFO;  c.  Lexiscan Myoview 05/2012: EF 69%, no ischemia. LHC (05/2012):  Ostial diagonal 30-40%, proximal mid ramus intermedius 40-50%, RCA stent patent with 40-50% after stent, then 40%, distal RCA 40-50%, EF 55-65%. Medical therapy continued.;  d.  Carlton Adam Myoview (06/2013):  No ischemia, EF 83%, normal study  . Full dentures   . GERD (gastroesophageal reflux disease)   . History of basal cell carcinoma excision    scalp  . History of hiatal hernia   . History of kidney stones 51 years ago   x1  . History of recurrent UTIs   . Hypertension   . IFG (impaired fasting glucose)   . MVP (mitral valve prolapse)    mild per last echo 04-03-2017 in epic  . OA (osteoarthritis)    knees , fingers  . PAF (paroxysmal atrial fibrillation) [facility])    cardiologist-- dr Rayann Heman  . S/P ablation of atrial fibrillation 08/15/2010  . S/P drug eluting coronary stent placement 04/04/2009  . Tremor   . Wears glasses     Past Surgical History:  Procedure Laterality Date  . ANAL RECTAL MANOMETRY N/A 01/11/2014   Procedure: ANAL RECTAL MANOMETRY;  Surgeon: Leighton Ruff, MD;  Location: [facility];  Service: Endoscopy;  Laterality: N/A;  . APPENDECTOMY  1973  . BACK SURGERY     laminectomy times 2; 2000 and 2001  . BUNIONECTOMY Bilateral 2013  . CARDIAC CATHETERIZATION  10-24-2010   dr allred   singl-vessel CAD with patent stent mRCA/  moderate disease mRCA beyond stent segment/  normal lvsf  . CARDIAC ELECTROPHYSIOLOGY MAPPING AND ABLATION  08-15-2010  dr allred  . CARDIOVASCULAR STRESS TEST  06-15-2013  dr allred   normal perfusion study/  no ischemia/  ef 83%  . CARPAL TUNNEL RELEASE Right 25 years ago  . CHOLECYSTECTOMY OPEN  1973   W/  APPENDECTOMY  . CORONARY ANGIOPLASTY WITH STENT PLACEMENT  04-04-2009  dr Darnell Level brodie   PCI and DES x1 to  mRCA/  mLAD  40%, pCX 30%, pRCA 30%/  normal LVF  . FLEXIBLE SIGMOIDOSCOPY N/A 07/07/2013   Procedure: FLEXIBLE SIGMOIDOSCOPY;  Surgeon: Winfield Cunas., MD;  Location: [facility];  Service: Endoscopy;  Laterality: N/A;  unprepped  . LEFT HEART CATH AND CORONARY ANGIOGRAPHY N/A 10/26/2019   Procedure: LEFT HEART CATH AND CORONARY ANGIOGRAPHY;  Surgeon: Martinique, Peter M, MD;  Location: [facility];  Service: Cardiovascular;  Laterality: N/A;  . LEFT HEART CATHETERIZATION WITH CORONARY ANGIOGRAM N/A 05/09/2012   Procedure: LEFT HEART CATHETERIZATION WITH CORONARY ANGIOGRAM;  Surgeon: Marcello Moores  Baird Kay, MD;  Location: [facility];  Service: Cardiovascular;  Laterality: N/A;  . MOHS SURGERY  2009   scalp  . PILONIDAL CYST EXCISION  2009;   2000;   1999  . RECTAL ULTRASOUND N/A 01/11/2014   Procedure: RECTAL ULTRASOUND;  Surgeon: Leighton Ruff, MD;  Location: [facility];  Service: Endoscopy;  Laterality: N/A;  . TOTAL KNEE ARTHROPLASTY Right 09/07/2014   Procedure: RIGHT TOTAL KNEE ARTHROPLASTY;  Surgeon: Paralee Cancel, MD;  Location: [facility];  Service: Orthopedics;  Laterality: Right;  . TOTAL KNEE ARTHROPLASTY Left 12/20/2015   Procedure: LEFT TOTAL KNEE ARTHROPLASTY;  Surgeon: Paralee Cancel, MD;  Location: [facility];  Service: Orthopedics;  Laterality: Left;  . TRANSTHORACIC ECHOCARDIOGRAM  04-03-2017   dr allred   Leory Plowman 55-60%/  mild MVP anterior leaflet (valve area 2.1cm^2) with mild regurg. , no stenosis/  trivial TR and PR  . VAGINAL HYSTERECTOMY  1975   W/ BILATERAL SALPINOOPHOORECTOMY  . VAGUS NERVE STIMULATOR INSERTION N/A 03/10/2014   Procedure: IMPLANTATION OF SACRAL  NERVE STIMULATOR ;  Surgeon: Leighton Ruff, MD;  Location: [place] [facility];  Service: General;  Laterality: N/A;  Sacral. [employer]    Current Outpatient Medications  Medication Sig Dispense Refill  . dabigatran (PRADAXA) 150 MG CAPS capsule Take 1 capsule (150 mg total) by mouth 2 (two) times daily.    . diphenhydrAMINE  (BENADRYL) 25 mg capsule Take 25 mg by mouth at bedtime.     . hydrochlorothiazide (HYDRODIURIL) 12.5 MG tablet Take 1 tablet (12.5 mg total) by mouth daily. 30 tablet 11  . lisinopril (PRINIVIL,ZESTRIL) 10 MG tablet Take 1 tablet (10 mg total) by mouth daily. (Patient taking differently: Take 10 mg by mouth every morning. ) 90 tablet 1  . metoprolol succinate (TOPROL-XL) 25 MG 24 hr tablet TAKE 1 TABLET TWICE A DAY (Patient taking differently: Take 25 mg by mouth daily. ) 180 tablet 3  . nitroGLYCERIN (NITROSTAT) 0.4 MG SL tablet DISSOLVE 1 TABLET UNDER THE TONGUE EVERY 5 MINUTES AS NEEDED FOR CHEST PAIN (Patient taking differently: Place 0.4 mg under the tongue every 5 (five) minutes as needed for chest pain. ) 75 tablet 3  . primidone (MYSOLINE) 50 MG tablet Take 2 tablets (100 mg total) by mouth at bedtime. 180 tablet 3  . rosuvastatin (CRESTOR) 40 MG tablet TAKE 1 TABLET DAILY (Patient taking differently: Take 40 mg by mouth daily. ) 90 tablet 3  . traMADol (ULTRAM) 50 MG tablet Take 50 mg by mouth 3 (three) times daily as needed for moderate pain.     . traZODone (DESYREL) 50 MG tablet Take 150 mg by mouth at bedtime.      No current facility-administered medications for this visit.    Allergies:   Cephalexin   Social History:  The patient  reports that she quit smoking about 31 years ago. Her smoking use included cigarettes. She has a 10.00 pack-year smoking history. She has never used smokeless tobacco. She reports that she does not drink alcohol or use drugs.   Family History:  The patient's family history includes Coronary artery disease in her father; Heart disease in her brother, mother, and sister.  ROS:  Please see the history of present illness.  All other systems are reviewed and otherwise negative.   PHYSICAL EXAM:  VS:  BP (!) 112/58   Pulse 68   Ht 5\' 2"  (1.575 m)   Wt 189 lb (85.7 kg)   SpO2 96%   BMI  34.57 kg/m  BMI: Body mass index is 34.57 kg/m. Well nourished,  well developed, in no acute distress  HEENT: normocephalic, atraumatic  Neck: no JVD, carotid bruits or masses Cardiac:  RRR; no significant murmurs, no rubs, or gallops Lungs:  CTA b/l, no wheezing, rhonchi or rales  Abd: soft, nontender MS: no deformity or atrophy Ext:  no edema  Skin: warm and dry, no rash Neuro:  No gross deficits appreciated Psych: euthymic mood, full affect     EKG:  Not done today   11/05/2019: TTE IMPRESSIONS  1. Left ventricular ejection fraction, by estimation, is 55 to 60%. The  left ventricle has normal function. The left ventricle has no regional  wall motion abnormalities. Left ventricular diastolic parameters are  consistent with Grade I diastolic  dysfunction (impaired relaxation).  2. Right ventricular systolic function is normal. The right ventricular  size is normal. There is normal pulmonary artery systolic pressure.  3. The mitral valve is normal in structure. Mild mitral valve  regurgitation. No evidence of mitral stenosis.  4. The aortic valve is normal in structure. Aortic valve regurgitation is  not visualized. No aortic stenosis is present.    10/26/2019: R/LHC  Ramus lesion is 35% stenosed.  Prox RCA to Mid RCA lesion is 25% stenosed.  Previously placed Mid RCA-1 stent (unknown type) is widely patent.  Mid RCA-2 lesion is 25% stenosed.  The left ventricular systolic function is normal.  LV end diastolic pressure is normal.  The left ventricular ejection fraction is 55-65% by visual estimate.   1. Nonobstructive CAD.  2. Normal LV function 3. Normal LVEDP  Plan: continue medical management. May resume Pradaxa tomorrow am.  04/03/17: TTE Study Conclusions - Left ventricle: Systolic function was normal. The estimated   ejection fraction was in the range of 60% to 65%. - Aortic valve: Mildly thickened, mildly calcified leaflets. - Mitral valve: Mild prolapse, involving the anterior leaflet.   There was mild  regurgitation. Valve area by pressure half-time:   2.1 cm^2. - Left atrium: Volume/bsa, ES, (1-plane Simpson&'s, A2C): 20.2   ml/m^2.   06/16/13; Stress test Impression Exercise Capacity:  Lexiscan with no exercise. BP Response:  Normal blood pressure response. Clinical Symptoms:  No significant symptoms noted. ECG Impression:  No significant ST segment change suggestive of ischemia. Comparison with Prior Nuclear Study: No changes from  previous nuclear study performed 05/06/12.   Overall Impression:  Normal stress nuclear study. LV Ejection Fraction: 83%.  LV Wall Motion:  NL LV Function; NL Wall Motion.   05/09/2012: LHC Left mainstem: Short and normal  Left anterior descending (LAD): Courses to the apex and provides a moderate size diagonal.  There is vessel calcification and mild luminal irregularity throughout.  Of note, there is a 30-40% ostial diagonal stenosis.  No high grade obstruction is noted.   Left circumflex (LCx): Provides a ramus intermedius that bifurcates distally.  There is segmental plaque of probably 40-50% in the proximal mid junction.  It appears unchanged from the prior study.  The av circ has moderate calcification and a moderate size marginal branch, and an AV branch with 3 small PL branches.  No areas of high grade disease are noted.   Right coronary artery (RCA): The RCA has been previously stented.  The stent is widely patent.  There is about 40-50% smooth narrowing just after the stent, and this is slightly improved after IC TNG.  There is diffuse segmental plaque of 40% beyond this area that  does not appear critical.  There is also 40-50% distal plaque as well. After NTG, this area opens as well.    Left ventriculography: Left ventricular systolic function is normal, LVEF is estimated at 55-65%, there is no significant mitral regurgitation   Final Conclusions:   1.  Continued patency of the RCA stent with some moderate plaque distally. 2.  Scattered  moderate plaque in the ramus intermedius without critical narrowing   10/25/2010: LHC ANGIOGRAPHIC FINDINGS: 1. The left main coronary artery had no obstructive disease. 2. The left anterior descending was a large vessel that coursed to the     apex and gave off a moderate-sized diagonal branch.  The left     anterior descending artery had mild plaque disease throughout the     mid segment.  The diagonal branch had an ostial 30-40% stenosis.     None of the lesions appeared to be flow-limiting. 3. Circumflex artery was a moderate-sized vessel that gave off a small-     caliber obtuse marginal branch.  There were no flow-limiting     lesions noted in this vessel. 4. The right coronary artery is a moderate-sized vessel and has a     patent stent in the mid vessel with minimal in-stent restenosis.     Just beyond the stented segment, there appears to be a 50-60%     stenosis.  Multiple views were taken of this lesion and it did not     appear to be flow-limiting. 5. Left ventricular angiogram was performed in the RAO projection     showed normal left ventricular systolic function.  IMPRESSION: 1. Single-vessel coronary artery disease with patent stent in the mid     right coronary artery. 2. Moderate disease in the mid right coronary artery beyond the     stented segment. 3. Normal left ventricular systolic function.   Recent Labs: 10/21/2019: ALT 11; BUN 21; Creatinine, Ser 1.27; Hemoglobin 14.0; Platelets 191; Potassium 3.5; Sodium 136  No results found for requested labs within last 8760 hours.   Estimated Creatinine Clearance: 38.9 mL/min (A) (by C-G formula based on SCr of 1.27 mg/dL (H)).   Wt Readings from Last 3 Encounters:  11/09/19 189 lb (85.7 kg)  10/26/19 190 lb (86.2 kg)  10/21/19 189 lb (85.7 kg)     Other studies reviewed: Additional studies/records reviewed today include: summarized above  ASSESSMENT AND PLAN:  1. AFib     H/o PVI ablation remotely      CHA2DS2Vasc is 6, on Pradaxa, appropriately dosed for her last labs     No symptoms to suggest recurrent AF  2. CAD     On BB and high dose statin, has prn NTG available to her 3. DOE, CP     Stable CAD and echo     Does not appear to be cardiac in etiology  Radial site is stable, well healed Will stop the Imdur given she had stable, nonobstructive CAD SHe asks that her HCTZ be stopped.  She was instructed to take it QOD for 3 doses then stop monitoring closely for recurrent swelling or worsening in her SOB  I offered to refer her for pulmonary evaluation, she said she would talk with her PMD.   4. HTN     Looks good   5. HLD     On Crestor     Monitored and managed by her primary     She mentions she takes 3x/week  CAD was stable     Disposition: Will have her back in 66mo, sooner if needed.  She will follow up with her PMD    Current medicines are reviewed at length with the patient today.  The patient did not have any concerns regarding medicines.  Venetia Night, PA-C 11/09/2019 2:57 PM     [address] [place] [address] [phone] (office)  [phone] (fax)

## 2019-11-09 ENCOUNTER — Ambulatory Visit (INDEPENDENT_AMBULATORY_CARE_PROVIDER_SITE_OTHER): Payer: Medicare Other | Admitting: Physician Assistant

## 2019-11-09 ENCOUNTER — Other Ambulatory Visit: Payer: Self-pay

## 2019-11-09 ENCOUNTER — Other Ambulatory Visit: Payer: Self-pay | Admitting: Internal Medicine

## 2019-11-09 VITALS — BP 112/58 | HR 68 | Ht 62.0 in | Wt 189.0 lb

## 2019-11-09 DIAGNOSIS — I10 Essential (primary) hypertension: Secondary | ICD-10-CM | POA: Diagnosis not present

## 2019-11-09 DIAGNOSIS — I2510 Atherosclerotic heart disease of native coronary artery without angina pectoris: Secondary | ICD-10-CM | POA: Diagnosis not present

## 2019-11-09 DIAGNOSIS — R0609 Other forms of dyspnea: Secondary | ICD-10-CM

## 2019-11-09 DIAGNOSIS — I480 Paroxysmal atrial fibrillation: Secondary | ICD-10-CM

## 2019-11-09 DIAGNOSIS — R0600 Dyspnea, unspecified: Secondary | ICD-10-CM | POA: Diagnosis not present

## 2019-11-09 DIAGNOSIS — E785 Hyperlipidemia, unspecified: Secondary | ICD-10-CM

## 2019-11-09 NOTE — Patient Instructions (Addendum)
Medication Instructions:   STOP TAKING ISOSORBIDE  TAKE HCTZ EVERY OTHER DAY   FOR THREE DAYS THEN STOP   *If you need a refill on your cardiac medications before your next appointment, please call your pharmacy*   Lab Work: NONE ORDERED  TODAY   If you have labs (blood work) drawn today and your tests are completely normal, you will receive your results only by: [name] MyChart Message (if you have MyChart) OR . A paper copy in the mail If you have any lab test that is abnormal or we need to change your treatment, we will call you to review the results.   Testing/Procedures: NONE ORDERED  TODAY   Follow-Up: At [facility], you and your health needs are our priority.  As part of our continuing mission to provide you with exceptional heart care, we have created designated Provider Care Teams.  These Care Teams include your primary Cardiologist (physician) and Advanced Practice Providers (APPs -  Physician Assistants and Nurse Practitioners) who all work together to provide you with the care you need, when you need it.  We recommend signing up for the patient portal called "MyChart".  Sign up information is provided on this After Visit Summary.  MyChart is used to connect with patients for Virtual Visits (Telemedicine).  Patients are able to view lab/test results, encounter notes, upcoming appointments, etc.  Non-urgent messages can be sent to your provider as well.   To learn more about what you can do with MyChart, go to [email or web address].    Your next appointment:   6 month(s)  The format for your next appointment:   In Person  Provider:   You may see Dr. Rayann Heman  or one of the following Advanced Practice Providers on your designated Care Team:    Chanetta Marshall, NP  Tommye Standard, PA-C  Legrand Como "Oda Kilts, [place]    Other Instructions

## 2019-11-11 ENCOUNTER — Other Ambulatory Visit: Payer: Self-pay | Admitting: Internal Medicine

## 2019-11-12 NOTE — Telephone Encounter (Signed)
Prescription refill request received for pradaxa.    LovTommye Standard, 11/09/2019 Scr:1.27, 10/21/2019 Weight: 85.7 kg CrCl: 51.53ml/min   Prescription refill sent.

## 2019-12-21 DIAGNOSIS — M4310 Spondylolisthesis, site unspecified: Secondary | ICD-10-CM | POA: Insufficient documentation

## 2019-12-21 DIAGNOSIS — M48061 Spinal stenosis, lumbar region without neurogenic claudication: Secondary | ICD-10-CM | POA: Insufficient documentation

## 2020-01-18 ENCOUNTER — Other Ambulatory Visit: Payer: Self-pay | Admitting: Internal Medicine

## 2020-02-26 DIAGNOSIS — M961 Postlaminectomy syndrome, not elsewhere classified: Secondary | ICD-10-CM | POA: Insufficient documentation

## 2020-02-29 ENCOUNTER — Telehealth: Payer: Self-pay | Admitting: Internal Medicine

## 2020-02-29 NOTE — Telephone Encounter (Signed)
Left message for the pt for her to keep her form. If she would like to call back and inform our office name of surgeon, that I will be happy to reach out the surgeon's office for complete information needed for upcoming surgery for the pt.  Left message she can let the operator know name of surgeon.

## 2020-02-29 NOTE — Telephone Encounter (Signed)
Patient states she is having a spinal fusion that is pending clearance. The office performing the procedure gave the patient forms, to be completed and returned by Dr. Rayann Heman. Patient is requesting to drop forms off at the office.

## 2020-03-01 NOTE — Telephone Encounter (Signed)
Left message x 2 to call our office with the name of the surgeon and phone # for surgeon's office. I will be happy to call the surgeon's office to get complete procedure information.

## 2020-04-05 ENCOUNTER — Ambulatory Visit: Payer: Self-pay | Admitting: Orthopedic Surgery

## 2020-04-05 ENCOUNTER — Other Ambulatory Visit: Payer: Self-pay

## 2020-04-05 DIAGNOSIS — M549 Dorsalgia, unspecified: Secondary | ICD-10-CM | POA: Insufficient documentation

## 2020-04-05 DIAGNOSIS — K219 Gastro-esophageal reflux disease without esophagitis: Secondary | ICD-10-CM | POA: Insufficient documentation

## 2020-04-05 DIAGNOSIS — S92309A Fracture of unspecified metatarsal bone(s), unspecified foot, initial encounter for closed fracture: Secondary | ICD-10-CM | POA: Insufficient documentation

## 2020-04-05 DIAGNOSIS — G4700 Insomnia, unspecified: Secondary | ICD-10-CM | POA: Insufficient documentation

## 2020-04-14 ENCOUNTER — Other Ambulatory Visit: Payer: Self-pay

## 2020-04-18 ENCOUNTER — Other Ambulatory Visit: Payer: Self-pay

## 2020-04-18 ENCOUNTER — Other Ambulatory Visit: Payer: Self-pay | Admitting: Internal Medicine

## 2020-04-18 ENCOUNTER — Ambulatory Visit (INDEPENDENT_AMBULATORY_CARE_PROVIDER_SITE_OTHER): Payer: Medicare Other | Admitting: Internal Medicine

## 2020-04-18 ENCOUNTER — Encounter: Payer: Self-pay | Admitting: Internal Medicine

## 2020-04-18 VITALS — BP 130/66 | HR 68 | Ht 62.0 in

## 2020-04-18 DIAGNOSIS — I480 Paroxysmal atrial fibrillation: Secondary | ICD-10-CM

## 2020-04-18 DIAGNOSIS — Z01810 Encounter for preprocedural cardiovascular examination: Secondary | ICD-10-CM

## 2020-04-18 DIAGNOSIS — I10 Essential (primary) hypertension: Secondary | ICD-10-CM | POA: Diagnosis not present

## 2020-04-18 DIAGNOSIS — I2510 Atherosclerotic heart disease of native coronary artery without angina pectoris: Secondary | ICD-10-CM

## 2020-04-18 DIAGNOSIS — E785 Hyperlipidemia, unspecified: Secondary | ICD-10-CM | POA: Diagnosis not present

## 2020-04-18 NOTE — Progress Notes (Signed)
PCP: Fransisca Connors, PA-C Primary Cardiologist: previously Dr Burt Knack Primary EP: Dr Rayann Heman  [name] is a 74 y.o. female who presents today for routine electrophysiology followup.  Since last being seen in our clinic, the patient reports doing very well.  Today, she denies symptoms of palpitations, chest pain, shortness of breath,  lower extremity edema, dizziness, presyncope, or syncope.  Her primary concern is with back pain.  She is planned to have back surgery soon.  The patient is otherwise without complaint today.   Past Medical History:  Diagnosis Date  . Anticoagulated    Pradaxa  . Bilateral leg pain    Right leg has greater pain than the left.  . Coronary artery disease cardiologist-- dr Rayann Heman   a.  s/p Xience DES to RCA 04/2009;   b. TEE 2/12: EF 40%, Large PFO;  c.  Lexiscan Myoview 05/2012: EF 69%, no ischemia. LHC (05/2012):  Ostial diagonal 30-40%, proximal mid ramus intermedius 40-50%, RCA stent patent with 40-50% after stent, then 40%, distal RCA 40-50%, EF 55-65%. Medical therapy continued.;  d.  Carlton Adam Myoview (06/2013):  No ischemia, EF 83%, normal study  . Full dentures   . GERD (gastroesophageal reflux disease)   . History of basal cell carcinoma excision    scalp  . History of hiatal hernia   . History of kidney stones 51 years ago   x1  . History of recurrent UTIs   . Hypertension   . IFG (impaired fasting glucose)   . MVP (mitral valve prolapse)    mild per last echo 04-03-2017 in epic  . OA (osteoarthritis)    knees , fingers  . PAF (paroxysmal atrial fibrillation) [facility])    cardiologist-- dr Rayann Heman  . S/P ablation of atrial fibrillation 08/15/2010  . S/P drug eluting coronary stent placement 04/04/2009  . Spinal stenosis   . Tremor   . Wears glasses    Past Surgical History:  Procedure Laterality Date  . ANAL RECTAL MANOMETRY N/A 01/11/2014   Procedure: ANAL RECTAL MANOMETRY;  Surgeon: Leighton Ruff, MD;  Location: [facility];  Service:  Endoscopy;  Laterality: N/A;  . APPENDECTOMY  1973  . BACK SURGERY     laminectomy times 2; 2000 and 2001  . BUNIONECTOMY Bilateral 2013  . CARDIAC CATHETERIZATION  10-24-2010   dr Ashliegh Parekh   singl-vessel CAD with patent stent mRCA/  moderate disease mRCA beyond stent segment/  normal lvsf  . CARDIAC ELECTROPHYSIOLOGY MAPPING AND ABLATION  08-15-2010  dr Neleh Muldoon  . CARDIOVASCULAR STRESS TEST  06-15-2013  dr Sheena Simonis   normal perfusion study/  no ischemia/  ef 83%  . CARPAL TUNNEL RELEASE Right 25 years ago  . CHOLECYSTECTOMY OPEN  1973   W/  APPENDECTOMY  . CORONARY ANGIOPLASTY WITH STENT PLACEMENT  04-04-2009  dr Darnell Level brodie   PCI and DES x1 to  mRCA/  mLAD 40%, pCX 30%, pRCA 30%/  normal LVF  . FLEXIBLE SIGMOIDOSCOPY N/A 07/07/2013   Procedure: FLEXIBLE SIGMOIDOSCOPY;  Surgeon: Winfield Cunas., MD;  Location: [facility];  Service: Endoscopy;  Laterality: N/A;  unprepped  . LEFT HEART CATH AND CORONARY ANGIOGRAPHY N/A 10/26/2019   Procedure: LEFT HEART CATH AND CORONARY ANGIOGRAPHY;  Surgeon: Martinique, Peter M, MD;  Location: [place] CV LAB;  Service: Cardiovascular;  Laterality: N/A;  . LEFT HEART CATHETERIZATION WITH CORONARY ANGIOGRAM N/A 05/09/2012   Procedure: LEFT HEART CATHETERIZATION WITH CORONARY ANGIOGRAM;  Surgeon: Hillary Bow, MD;  Location: [facility]  CATH LAB;  Service: Cardiovascular;  Laterality: N/A;  . MOHS SURGERY  2009   scalp  . PILONIDAL CYST EXCISION  2009;   2000;   1999  . RECTAL ULTRASOUND N/A 01/11/2014   Procedure: RECTAL ULTRASOUND;  Surgeon: Leighton Ruff, MD;  Location: [facility];  Service: Endoscopy;  Laterality: N/A;  . TOTAL KNEE ARTHROPLASTY Right 09/07/2014   Procedure: RIGHT TOTAL KNEE ARTHROPLASTY;  Surgeon: Paralee Cancel, MD;  Location: [facility];  Service: Orthopedics;  Laterality: Right;  . TOTAL KNEE ARTHROPLASTY Left 12/20/2015   Procedure: LEFT TOTAL KNEE ARTHROPLASTY;  Surgeon: Paralee Cancel, MD;  Location: [facility];  Service: Orthopedics;  Laterality:  Left;  . TRANSTHORACIC ECHOCARDIOGRAM  04-03-2017   dr Trishia Cuthrell   Leory Plowman 55-60%/  mild MVP anterior leaflet (valve area 2.1cm^2) with mild regurg. , no stenosis/  trivial TR and PR  . VAGINAL HYSTERECTOMY  1975   W/ BILATERAL SALPINOOPHOORECTOMY  . VAGUS NERVE STIMULATOR INSERTION N/A 03/10/2014   Procedure: IMPLANTATION OF SACRAL  NERVE STIMULATOR ;  Surgeon: Leighton Ruff, MD;  Location: [facility];  Service: General;  Laterality: N/A;  Sacral. [employer]    ROS- all systems are reviewed and negatives except as per HPI above  Current Outpatient Medications  Medication Sig Dispense Refill  . dicyclomine (BENTYL) 10 MG capsule dicyclomine 10 mg capsule    . diphenhydrAMINE (BENADRYL) 25 mg capsule Take 25 mg by mouth at bedtime.     . hydrochlorothiazide (HYDRODIURIL) 12.5 MG tablet Take 1 tablet (12.5 mg total) by mouth daily. 30 tablet 11  . isosorbide mononitrate (IMDUR) 30 MG 24 hr tablet isosorbide mononitrate ER 30 mg tablet,extended release 24 hr    . lisinopril (PRINIVIL,ZESTRIL) 10 MG tablet Take 1 tablet (10 mg total) by mouth daily. 90 tablet 1  . metoprolol succinate (TOPROL-XL) 25 MG 24 hr tablet TAKE 1 TABLET TWICE A DAY 180 tablet 3  . nitroGLYCERIN (NITROSTAT) 0.4 MG SL tablet DISSOLVE 1 TABLET UNDER THE TONGUE EVERY 5 MINUTES AS NEEDED FOR CHEST PAIN 75 tablet 3  . pantoprazole (PROTONIX) 40 MG tablet Take 1 tablet (40 mg total) by mouth 2 (two) times daily. NEEDS VISIT FOR FURTHER REFILLS 180 tablet 0  . PRADAXA 150 MG CAPS capsule TAKE 1 CAPSULE EVERY 12 HOURS (NEEDS CARDIOLOGY APPOINTMENT FOR FUTURE REFILLS) 180 capsule 1  . primidone (MYSOLINE) 50 MG tablet Take 2 tablets (100 mg total) by mouth at bedtime. 180 tablet 3  . rosuvastatin (CRESTOR) 40 MG tablet TAKE 1 TABLET DAILY 90 tablet 3  . traMADol (ULTRAM) 50 MG tablet Take 50 mg by mouth 3 (three) times daily as needed for moderate pain.     . traZODone (DESYREL) 50 MG tablet Take 150 mg by mouth at bedtime.      . triamcinolone (KENALOG) 0.1 % paste 1 application 2 (two) times daily.     No current facility-administered medications for this visit.    Physical Exam: Vitals:   04/18/20 0907  BP: 130/66  Pulse: 68  SpO2: 90%  Height: 5\' 2"  (1.575 m)    GEN- The patient is well appearing, alert and oriented x 3 today.   Head- normocephalic, atraumatic Eyes-  Sclera clear, conjunctiva pink Ears- hearing intact Oropharynx- clear Lungs- Clear to ausculation bilaterally, normal work of breathing Heart- Regular rate and rhythm, no murmurs, rubs or gallops, PMI not laterally displaced GI- soft, NT, ND, + BS Extremities- no clubbing, cyanosis, or edema  Wt Readings from Last 3  Encounters:  11/09/19 189 lb (85.7 kg)  10/26/19 190 lb (86.2 kg)  10/21/19 189 lb (85.7 kg)    EKG tracing ordered today is personally reviewed and shows sinus  Assessment and Plan:  1. Paroxysmal atrial fibrillation Resolved s/p ablation in 2012 chads2vasc score is 6.  She is on [facility] therapy with pradaxa  2. CAD No ischemic symptoms Cath 10/26/19 is reviewed.  CAD was nonobstructive at that time.  EF normal. No changes  3. HTN Stable No change required today  4. HL Continue crestor  5. Overweight Body mass index is 34.57 kg/m. lifestyle modification is advised  6. preop She is moderate risk for surgery Proceed if medically necessary without further CV testing Ok to hold pradaxa up to 3 days prior to the procedure and resume when able post operatively.  Risks, benefits and potential toxicities for medications prescribed and/or refilled reviewed with patient today.   Refer to general cardiology to re-establish in 6 months Return to see EP APP every year  Thompson Grayer MD, [facility] 04/18/2020 9:23 AM

## 2020-04-18 NOTE — Patient Instructions (Addendum)
Medication Instructions:  Your physician recommends that you continue on your current medications as directed. Please refer to the Current Medication list given to you today.  *If you need a refill on your cardiac medications before your next appointment, please call your pharmacy*  Lab Work: None ordered.  If you have labs (blood work) drawn today and your tests are completely normal, you will receive your results only by: [name] MyChart Message (if you have MyChart) OR . A paper copy in the mail If you have any lab test that is abnormal or we need to change your treatment, we will call you to review the results.  Testing/Procedures: None ordered.  Follow-Up: At [facility], you and your health needs are our priority.  As part of our continuing mission to provide you with exceptional heart care, we have created designated Provider Care Teams.  These Care Teams include your primary Cardiologist (physician) and Advanced Practice Providers (APPs -  Physician Assistants and Nurse Practitioners) who all work together to provide you with the care you need, when you need it.  We recommend signing up for the patient portal called "MyChart".  Sign up information is provided on this After Visit Summary.  MyChart is used to connect with patients for Virtual Visits (Telemedicine).  Patients are able to view lab/test results, encounter notes, upcoming appointments, etc.  Non-urgent messages can be sent to your provider as well.   To learn more about what you can do with MyChart, go to [email or web address].    Your next appointment:   Your physician wants you to follow-up in: 1 year with Dr. Tommye Standard. You will receive a reminder letter in the mail two months in advance. If you don't receive a letter, please call our office to schedule the follow-up appointment.   Other Instructions:

## 2020-04-25 ENCOUNTER — Other Ambulatory Visit: Payer: Self-pay

## 2020-04-25 ENCOUNTER — Encounter: Payer: Self-pay | Admitting: Internal Medicine

## 2020-04-25 ENCOUNTER — Encounter: Payer: Self-pay | Admitting: Vascular Surgery

## 2020-04-25 ENCOUNTER — Ambulatory Visit (INDEPENDENT_AMBULATORY_CARE_PROVIDER_SITE_OTHER): Payer: Medicare Other | Admitting: Vascular Surgery

## 2020-04-25 ENCOUNTER — Encounter: Payer: Self-pay | Admitting: General Practice

## 2020-04-25 ENCOUNTER — Ambulatory Visit: Payer: Medicare Other | Admitting: Internal Medicine

## 2020-04-25 VITALS — BP 133/82 | HR 70 | Temp 98.7°F | Resp 16 | Ht 62.0 in | Wt 190.0 lb

## 2020-04-25 DIAGNOSIS — I2510 Atherosclerotic heart disease of native coronary artery without angina pectoris: Secondary | ICD-10-CM

## 2020-04-25 DIAGNOSIS — M5136 Other intervertebral disc degeneration, lumbar region: Secondary | ICD-10-CM

## 2020-04-25 NOTE — Progress Notes (Signed)
[facility] [place]  Patient name: [name] MRN: [id number] DOB: [birth year] Sex: female  REASON FOR CONSULT: Discuss access forOLIF with Dr. Rolena Infante.  HPI: [name] is a 74 y.o. female, here today for discussion of exposure for 4 5 fusion.  She reports progressively severe low back and bilateral lower extremity pain.  She has failed conservative treatment.  Dr. Rolena Infante is seen her in evaluation and feels that most appropriate treatment would be multilevel with fusion from the 4 5 from the oblique approach.  Past Medical History:  Diagnosis Date  . Anticoagulated    Pradaxa  . Bilateral leg pain    Right leg has greater pain than the left.  . Coronary artery disease cardiologist-- dr Rayann Heman   a.  s/p Xience DES to RCA 04/2009;   b. TEE 2/12: EF 40%, Large PFO;  c.  Lexiscan Myoview 05/2012: EF 69%, no ischemia. LHC (05/2012):  Ostial diagonal 30-40%, proximal mid ramus intermedius 40-50%, RCA stent patent with 40-50% after stent, then 40%, distal RCA 40-50%, EF 55-65%. Medical therapy continued.;  d.  Carlton Adam Myoview (06/2013):  No ischemia, EF 83%, normal study  . Full dentures   . GERD (gastroesophageal reflux disease)   . History of basal cell carcinoma excision    scalp  . History of hiatal hernia   . History of kidney stones 51 years ago   x1  . History of recurrent UTIs   . Hypertension   . IFG (impaired fasting glucose)   . MVP (mitral valve prolapse)    mild per last echo 04-03-2017 in epic  . OA (osteoarthritis)    knees , fingers  . PAF (paroxysmal atrial fibrillation) [facility])    cardiologist-- dr Rayann Heman  . S/P ablation of atrial fibrillation 08/15/2010  . S/P drug eluting coronary stent placement 04/04/2009  . Spinal stenosis   . Tremor   . Wears glasses     Family History  Problem Relation Age of Onset  . Coronary artery disease Father   . Heart disease Mother   . Heart disease Sister   . Heart disease Brother      SOCIAL HISTORY: Social History   Socioeconomic History  . Marital status: Married    Spouse name: Not on file  . Number of children: 2  . Years of education: HS  . Highest education level: Not on file  Occupational History  . Occupation: retired    Fish farm manager: RETIRED  Tobacco Use  . Smoking status: Former Smoker    Packs/day: 0.50    Years: 20.00    Pack years: 10.00    Types: Cigarettes    Quit date: 07/02/1988    Years since quitting: 31.8  . Smokeless tobacco: Never Used  Vaping Use  . Vaping Use: Never used  Substance and Sexual Activity  . Alcohol use: No  . Drug use: Never  . Sexual activity: Not Currently    Birth control/protection: Surgical    Comment: 1st intercourse 1 yo-1 partner  Other Topics Concern  . Not on file  Social History Narrative  . Not on file   Social Determinants of Health   Financial Resource Strain:   . Difficulty of Paying Living Expenses: Not on file  Food Insecurity:   . Worried About Charity fundraiser in the Last Year: Not on file  . Ran Out of Food in the Last Year: Not on file  Transportation Needs:   .  Lack of Transportation (Medical): Not on file  . Lack of Transportation (Non-Medical): Not on file  Physical Activity:   . Days of Exercise per Week: Not on file  . Minutes of Exercise per Session: Not on file  Stress:   . Feeling of Stress : Not on file  Social Connections:   . Frequency of Communication with Friends and Family: Not on file  . Frequency of Social Gatherings with Friends and Family: Not on file  . Attends Religious Services: Not on file  . Active Member of Clubs or Organizations: Not on file  . Attends Archivist Meetings: Not on file  . Marital Status: Not on file  Intimate Partner Violence:   . Fear of Current or Ex-Partner: Not on file  . Emotionally Abused: Not on file  . Physically Abused: Not on file  . Sexually Abused: Not on file    Allergies  Allergen Reactions  . Cephalexin  Nausea Only    Current Outpatient Medications  Medication Sig Dispense Refill  . dicyclomine (BENTYL) 10 MG capsule dicyclomine 10 mg capsule    . diphenhydrAMINE (BENADRYL) 25 mg capsule Take 25 mg by mouth at bedtime.     . hydrochlorothiazide (HYDRODIURIL) 12.5 MG tablet Take 1 tablet (12.5 mg total) by mouth daily. 30 tablet 11  . isosorbide mononitrate (IMDUR) 30 MG 24 hr tablet isosorbide mononitrate ER 30 mg tablet,extended release 24 hr    . lisinopril (PRINIVIL,ZESTRIL) 10 MG tablet Take 1 tablet (10 mg total) by mouth daily. 90 tablet 1  . metoprolol succinate (TOPROL-XL) 25 MG 24 hr tablet TAKE 1 TABLET TWICE A DAY 180 tablet 3  . nitroGLYCERIN (NITROSTAT) 0.4 MG SL tablet DISSOLVE 1 TABLET UNDER THE TONGUE EVERY 5 MINUTES AS NEEDED FOR CHEST PAIN 75 tablet 3  . pantoprazole (PROTONIX) 40 MG tablet Take 1 tablet (40 mg total) by mouth 2 (two) times daily. NEEDS VISIT FOR FURTHER REFILLS 180 tablet 0  . PRADAXA 150 MG CAPS capsule TAKE 1 CAPSULE EVERY 12 HOURS (NEEDS CARDIOLOGY APPOINTMENT FOR FUTURE REFILLS) 180 capsule 1  . primidone (MYSOLINE) 50 MG tablet Take 2 tablets (100 mg total) by mouth at bedtime. 180 tablet 3  . rosuvastatin (CRESTOR) 40 MG tablet TAKE 1 TABLET DAILY 90 tablet 3  . traMADol (ULTRAM) 50 MG tablet Take 50 mg by mouth 3 (three) times daily as needed for moderate pain.     . traZODone (DESYREL) 50 MG tablet Take 150 mg by mouth at bedtime.     . triamcinolone (KENALOG) 0.1 % paste 1 application 2 (two) times daily.     No current facility-administered medications for this visit.    REVIEW OF SYSTEMS:  [X]  denotes positive finding, [ ]  denotes negative finding Cardiac  Comments:  Chest pain or chest pressure:    Shortness of breath upon exertion:    Short of breath when lying flat:    Irregular heart rhythm:        Vascular    Pain in calf, thigh, or hip brought on by ambulation:    Pain in feet at night that wakes you up from your sleep:     Blood  clot in your veins:    Leg swelling:         Pulmonary    Oxygen at home:    Productive cough:     Wheezing:         Neurologic    Sudden weakness in arms or legs:  Sudden numbness in arms or legs:     Sudden onset of difficulty speaking or slurred speech:    Temporary loss of vision in one eye:     Problems with dizziness:         Gastrointestinal    Blood in stool:     Vomited blood:         Genitourinary    Burning when urinating:     Blood in urine:        Psychiatric    Major depression:         Hematologic    Bleeding problems:    Problems with blood clotting too easily:        Skin    Rashes or ulcers:        Constitutional    Fever or chills:      PHYSICAL EXAM: Vitals:   04/25/20 1440  BP: 133/82  Pulse: 70  Resp: 16  Temp: 98.7 F (37.1 C)  TempSrc: Other (Comment)  SpO2: 97%  Weight: 190 lb (86.2 kg)  Height: 5\' 2"  (1.575 m)    GENERAL: The patient is a well-nourished female, in no acute distress. The vital signs are documented above. CARDIAC: There is a regular rate and rhythm.   VASCULAR: Carotid arteries without bruits bilaterally.  2+ radial pulses bilaterally.  2+ dorsalis pedis pulses bilaterally PULMONARY: There is good air exchange bilaterally without wheezing or rales. ABDOMEN: Soft and non-tender with moderate obesity.  She does have a midline abdominal incision MUSCULOSKELETAL: There are no major deformities or cyanosis. NEUROLOGIC: No focal weakness or paresthesias are detected. SKIN: There are no ulcers or rashes noted. PSYCHIATRIC: The patient has a normal affect.  DATA:   CT scan from February 2015 were reviewed.  This does show mild to moderate calcification in her aortoiliac segments.  Her aortic bifurcation was somewhat high at the L3-4 level.  MEDICAL ISSUES:  Had long discussion with the patient my role in exposure.  Explained that Dr. Rolena Infante feels that her best treatment option for her pain is fusion.  I explained  positioning in the lateral right-side-down approach.  Discussed the oblique incision anterior to the iliac spine.  I described mobilization of the ureter and structures overlying the spine to include arterial and venous structures.  Discussed potential injury for all of these.  I feel that she is an acceptable risk for surgery.  She is moderately obese with a BMI of 35.  Her only prior abdominal surgery is midline for cholecystectomy and also vaginal hysterectomy.  She does have moderate calcification of her iliac segments but this does not appear to be prohibitive.  Her questions were all answered and we will plan surgery as scheduled on 05/11/2020   Curt Jews [facility] [place] phone [phone]

## 2020-05-02 ENCOUNTER — Encounter: Payer: Medicare Other | Admitting: Vascular Surgery

## 2020-05-03 ENCOUNTER — Ambulatory Visit: Payer: Self-pay | Admitting: Orthopedic Surgery

## 2020-05-03 NOTE — H&P (Signed)
Subjective:   LBP and bilateral leg pain Left >right that has been ongoing for some time and despite appropriate conservative care including PT, and injection therapy the patient continues to have significant pain decreasing her quality of life. Therefore, after discussion of risks and benefits the patient has elected to move forward with surgical intervention. SHe is scheduled for a 2 day surgery: Day 1: OLIF L4-5, XLIF L3-4;  Day2: PSFI L3-5 at [facility] on 05/11/20 - 05/12/20 with Dr. Rolena Infante.  Patient Active Problem List   Diagnosis Date Noted  . Back pain 04/05/2020  . Fracture of metatarsal, closed 04/05/2020  . GERD (gastroesophageal reflux disease) 04/05/2020  . Insomnia 04/05/2020  . Lumbar post-laminectomy syndrome 02/26/2020  . Degenerative lumbar spinal stenosis 12/21/2019  . Degenerative spondylolisthesis 12/21/2019  . RUQ abdominal pain 05/06/2019  . Chronic anticoagulation 05/06/2019  . Screening for viral disease 05/06/2019  . Lumbar radiculopathy 05/21/2018  . S/P left TKA 12/20/2015  . Obese 09/08/2014  . S/P right TKA 09/07/2014  . History of total knee replacement, left 09/07/2014  . Congestive heart failure ([place]) 07/15/2013  . Diarrhea 07/15/2012  . Incontinence of feces 07/15/2012  . Precordial pain 10/19/2010  . EDEMA 12/16/2009  . Atrial fibrillation ([place]) 08/01/2009  . Disease of lung 04/28/2009  . Elevated fasting glucose 04/27/2009  . 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 Personal history of malignant neoplasm of skin 04/27/2009  . Hypertriglyceridemia 04/11/2009  . CAD, NATIVE VESSEL 04/11/2009  . DYSPNEA 04/11/2009  . HEARTBURN 04/11/2009  . Essential hypertension 04/01/2009  . Unstable angina ([place]) 04/01/2009   Past Medical History:  Diagnosis Date  . Anticoagulated    Pradaxa  . Bilateral leg pain    Right leg has greater pain than the left.  . Coronary artery disease cardiologist-- dr Rayann Heman   a.  s/p Xience DES to RCA 04/2009;    b. TEE 2/12: EF 40%, Large PFO;  c.  Lexiscan Myoview 05/2012: EF 69%, no ischemia. LHC (05/2012):  Ostial diagonal 30-40%, proximal mid ramus intermedius 40-50%, RCA stent patent with 40-50% after stent, then 40%, distal RCA 40-50%, EF 55-65%. Medical therapy continued.;  d.  Carlton Adam Myoview (06/2013):  No ischemia, EF 83%, normal study  . Full dentures   . GERD (gastroesophageal reflux disease)   . History of basal cell carcinoma excision    scalp  . History of hiatal hernia   . History of kidney stones 51 years ago   x1  . History of recurrent UTIs   . Hypertension   . IFG (impaired fasting glucose)   . MVP (mitral valve prolapse)    mild per last echo 04-03-2017 in epic  . OA (osteoarthritis)    knees , fingers  . PAF (paroxysmal atrial fibrillation) [facility])    cardiologist-- dr Rayann Heman  . S/P ablation of atrial fibrillation 08/15/2010  . S/P drug eluting coronary stent placement 04/04/2009  . Spinal stenosis   . Tremor   . Wears glasses     Past Surgical History:  Procedure Laterality Date  . ANAL RECTAL MANOMETRY N/A 01/11/2014   Procedure: ANAL RECTAL MANOMETRY;  Surgeon: Leighton Ruff, MD;  Location: [facility];  Service: Endoscopy;  Laterality: N/A;  . APPENDECTOMY  1973  . BACK SURGERY     laminectomy times 2; 2000 and 2001  . BUNIONECTOMY Bilateral 2013  . CARDIAC CATHETERIZATION  10-24-2010   dr allred   singl-vessel CAD with patent stent mRCA/  moderate disease mRCA beyond stent segment/  normal lvsf  . CARDIAC ELECTROPHYSIOLOGY MAPPING AND ABLATION  08-15-2010  dr allred  . CARDIOVASCULAR STRESS TEST  06-15-2013  dr allred   normal perfusion study/  no ischemia/  ef 83%  . CARPAL TUNNEL RELEASE Right 25 years ago  . CHOLECYSTECTOMY OPEN  1973   W/  APPENDECTOMY  . CORONARY ANGIOPLASTY WITH STENT PLACEMENT  04-04-2009  dr Darnell Level brodie   PCI and DES x1 to  mRCA/  mLAD 40%, pCX 30%, pRCA 30%/  normal LVF  . FLEXIBLE SIGMOIDOSCOPY N/A 07/07/2013   Procedure: FLEXIBLE  SIGMOIDOSCOPY;  Surgeon: Winfield Cunas., MD;  Location: [facility];  Service: Endoscopy;  Laterality: N/A;  unprepped  . LEFT HEART CATH AND CORONARY ANGIOGRAPHY N/A 10/26/2019   Procedure: LEFT HEART CATH AND CORONARY ANGIOGRAPHY;  Surgeon: Martinique, Peter M, MD;  Location: [facility];  Service: Cardiovascular;  Laterality: N/A;  . LEFT HEART CATHETERIZATION WITH CORONARY ANGIOGRAM N/A 05/09/2012   Procedure: LEFT HEART CATHETERIZATION WITH CORONARY ANGIOGRAM;  Surgeon: Hillary Bow, MD;  Location: [facility] CATH LAB;  Service: Cardiovascular;  Laterality: N/A;  . MOHS SURGERY  2009   scalp  . PILONIDAL CYST EXCISION  2009;   2000;   1999  . RECTAL ULTRASOUND N/A 01/11/2014   Procedure: RECTAL ULTRASOUND;  Surgeon: Leighton Ruff, MD;  Location: [facility];  Service: Endoscopy;  Laterality: N/A;  . TOTAL KNEE ARTHROPLASTY Right 09/07/2014   Procedure: RIGHT TOTAL KNEE ARTHROPLASTY;  Surgeon: Paralee Cancel, MD;  Location: [facility];  Service: Orthopedics;  Laterality: Right;  . TOTAL KNEE ARTHROPLASTY Left 12/20/2015   Procedure: LEFT TOTAL KNEE ARTHROPLASTY;  Surgeon: Paralee Cancel, MD;  Location: [facility];  Service: Orthopedics;  Laterality: Left;  . TRANSTHORACIC ECHOCARDIOGRAM  04-03-2017   dr allred   Leory Plowman 55-60%/  mild MVP anterior leaflet (valve area 2.1cm^2) with mild regurg. , no stenosis/  trivial TR and PR  . VAGINAL HYSTERECTOMY  1975   W/ BILATERAL SALPINOOPHOORECTOMY  . VAGUS NERVE STIMULATOR INSERTION N/A 03/10/2014   Procedure: IMPLANTATION OF SACRAL  NERVE STIMULATOR ;  Surgeon: Leighton Ruff, MD;  Location: [facility];  Service: General;  Laterality: N/A;  Sacral. [employer]    Current Outpatient Medications  Medication Sig Dispense Refill Last Dose  . diphenhydrAMINE (BENADRYL) 25 mg capsule Take 25 mg by mouth at bedtime.      . hydrochlorothiazide (HYDRODIURIL) 12.5 MG tablet Take 1 tablet (12.5 mg total) by mouth daily. 30 tablet 11   . isosorbide mononitrate  (IMDUR) 30 MG 24 hr tablet Take 30 mg by mouth daily.      [name] lisinopril (PRINIVIL,ZESTRIL) 10 MG tablet Take 1 tablet (10 mg total) by mouth daily. 90 tablet 1   . metoprolol succinate (TOPROL-XL) 25 MG 24 hr tablet TAKE 1 TABLET TWICE A DAY (Patient taking differently: Take 25 mg by mouth 2 (two) times daily. ) 180 tablet 3   . nitroGLYCERIN (NITROSTAT) 0.4 MG SL tablet DISSOLVE 1 TABLET UNDER THE TONGUE EVERY 5 MINUTES AS NEEDED FOR CHEST PAIN (Patient taking differently: Place 0.4 mg under the tongue every 5 (five) minutes as needed for chest pain. ) 75 tablet 3   . pantoprazole (PROTONIX) 40 MG tablet Take 1 tablet (40 mg total) by mouth 2 (two) times daily. NEEDS VISIT FOR FURTHER REFILLS 180 tablet 0   . PRADAXA 150 MG CAPS capsule TAKE 1 CAPSULE EVERY 12 HOURS (NEEDS CARDIOLOGY APPOINTMENT FOR FUTURE REFILLS) (Patient taking differently: Take  150 mg by mouth every 12 (twelve) hours. ) 180 capsule 1   . primidone (MYSOLINE) 50 MG tablet Take 2 tablets (100 mg total) by mouth at bedtime. 180 tablet 3   . rosuvastatin (CRESTOR) 40 MG tablet TAKE 1 TABLET DAILY (Patient taking differently: Take 40 mg by mouth daily. ) 90 tablet 3   . traZODone (DESYREL) 50 MG tablet Take 150 mg by mouth at bedtime.       No current facility-administered medications for this visit.   Allergies  Allergen Reactions  . Cephalexin Nausea Only    Social History   Tobacco Use  . Smoking status: Former Smoker    Packs/day: 0.50    Years: 20.00    Pack years: 10.00    Types: Cigarettes    Quit date: 07/02/1988    Years since quitting: 31.8  . Smokeless tobacco: Never Used  Substance Use Topics  . Alcohol use: No    Family History  Problem Relation Age of Onset  . Coronary artery disease Father   . Heart disease Mother   . Heart disease Sister   . Heart disease Brother     Review of Systems As stated in HPI  Objective:   Vitals: Ht: 5 ft 2 in 05/02/2020 01:21 pm BP: 130/88 05/02/2020 01:23  pm Pulse: 75 bpm 05/02/2020 01:23 pm  Clinical exam: [place]on is a pleasant individual, who appears younger than their stated age. She is alert and orientated 3. No shortness of breath, chest pain.   Heart: Regular rate and rhythm, no rubs, murmurs, or gallops  Lungs: Clear to auscultation bilaterally  Abdomen is soft and non-tender, negative loss of bowel and bladder control, no rebound tenderness. Bowel sounds has 4  Negative: skin lesions abrasions contusions    Peripheral pulses: 1+ dorsalis pedis/posterior tibialis pulses bilaterally. Compartment soft and nontender.    Gait pattern: Patient ambulates in a forward flexed posture.    Assistive devices: None    Neuro: 5/5 motor strength in the lower extremity bilaterally. Intermittent dysesthesias into the buttock and legs bilaterally. Sensation light touch is intact. Negative straight leg raise test, no clonus, negative Babinski test    Musculoskeletal: Significant low back pain radiating into the lower extremity with extension of the spine. Relief of symptoms with forward flexion. No significant hip, knee, ankle pain with SI joint range of motion    X-rays demonstrate degenerative lumbar disc disease L3-S1. She has a low-grade degenerative slip at L4-5. No significant scoliosis or fracture is seen.   Lumbar MRI: completed on 02/19/20 was [facility] with the [facility]. [facility] I have independently reviewed the images as well as the radiology report. Mild central and lateral recess/foraminal stenosis at L2-3. Slight anterior listhesis at L1/2 with only moderate foraminal stenosis. Partial right hemilaminectomy is noted at L5-S1. There is severe facet arthropathy but no significant foraminal stenosis. L3-4: There is been progression of her central stenosis and moderate to severe foraminal stenosis right worse than the left. L4-5: Moderate to severe bilateral foraminal and lateral recess stenosis with compression of the  exiting L4 and traversing L5 nerve root.    Patient reports a few days of relief following recent epidural steroid injection.  Assessment:    [name] is a very pleasant 75 year old with previous lumbar decompression surgery L3-S1 who continues to have severe debilitating neuropathic leg pain and back pain. Despite injection therapy and physical therapy her overall quality-of-life his continue to deteriorate. At this point she would like  to move forward with surgery. Based on the updated imaging studies and her clinical findings I believe the most problematic area is L3-4 and L4-5. She has a slight increase in the anterior listhesis at L4-5 which is contributing to her recurrent foraminal and lateral recess stenosis, and there is been progression in her lateral recess stenosis and degenerative disc disease at L3-4.  Plan:    Recommend oblique lumbar interbody fusion at L4-5 and a lateral interbody fusion at L3-4 to address the recurrent stenosis and neuropathic pain. I would then supplement this with posterior pedicle screw fixation.  All appropriate risks benefits and alternatives were discussed with the patient and she is expressed an understanding and a wound is to move forward. OLIF/XLIF risks, benefits of surgery were reviewed with the patient. These include: infection, bleeding, death, stroke, paralysis, ongoing or worse pain, need for additional surgery, injury to the lumbar plexus resulting in hip flexor weakness and difficulty walking without assistive devices. Adjacent segment degenerative disease, need for additional surgery including fusing other levels, leak of spinal fluid, Nonunion, hardware failure, breakage, or mal-position. Deep venous thrombosis (DVT) requiring additional treatment such as filter, and/or medications. Injury to abdominal contents, loss in bowel and bladder control. Risks and benefits of spinal fusion: Infection, bleeding, death, stroke, paralysis, ongoing or worse  pain, need for additional surgery, nonunion, leak of spinal fluid, adjacent segment degeneration requiring additional fusion surgery, Injury to abdominal vessels that can require anterior surgery to stop bleeding. Malposition of the cage and/or pedicle screws that could require additional surgery. Loss of bowel and bladder control. Postoperative hematoma causing neurologic compression that could require urgent or emergent re-operation.  We will obtain preoperative medical clearance from the patient's primary care provider as well as the cardiologist.  Patient has been evaluated by the vascular surgeon (Dr. early) who think she is acceptable risk for the planned procedure.  Patient scheduled to be fitted for LSO brace with physical therapy later this afternoon.  We have also discussed the post-operative recovery period to include: bathing/showering restrictions, wound healing, activity (and driving) restrictions, medications/pain mangement.  We have also discussed post-operative redflags to include: signs and symptoms of postoperative infection, DVT/PE.  All patients questions were invited and answered  Follow-up: 2 weeks postop

## 2020-05-03 NOTE — Progress Notes (Signed)
Your procedure is scheduled on Wednesday, November 10th.  Report to [facility] Main Entrance "A" at 6:30 A.M., and check in at the Admitting office.  Call this number if you have problems the morning of surgery:  [phone]  Call [phone] if you have any questions prior to your surgery date Monday-Friday 8am-4pm   Remember:  Do not eat or drink after midnight the night before your surgery   Take these medicines the morning of surgery with A SIP OF WATER  isosorbide mononitrate (IMDUR)  metoprolol succinate (TOPROL-XL) 25  pantoprazole (PROTONIX) rosuvastatin (CRESTOR)   If needed: nitroGLYCERIN (NITROSTAT)   Follow your surgeon's instructions on when to stop PRADAXA.  If no instructions were given by your surgeon then you will need to call the office to get those instructions.    As of today, STOP taking any Aspirin (unless otherwise instructed by your surgeon) Aleve, Naproxen, Ibuprofen, Motrin, Advil, Goody's, BC's, all herbal medications, fish oil, and all vitamins.                    Do not wear jewelry, make up, or nail polish            Do not wear lotions, powders, perfumes, or deodorant.            Do not shave 48 hours prior to surgery.             Do not bring valuables to the hospital.            [facility] is not responsible for any belongings or valuables.  Do NOT Smoke (Tobacco/Vaping) or drink Alcohol 24 hours prior to your procedure If you use a CPAP at night, you may bring all equipment for your overnight stay.   Contacts, glasses, dentures or bridgework may not be worn into surgery.      For patients admitted to the hospital, discharge time will be determined by your treatment team.   Patients discharged the day of surgery will not be allowed to drive home, and someone needs to stay with them for 24 hours.  Special instructions:   [facility]- Preparing For Surgery  Before surgery, you can play an important role. Because skin is not sterile, your skin  needs to be as free of germs as possible. You can reduce the number of germs on your skin by washing with CHG (chlorahexidine gluconate) Soap before surgery.  CHG is an antiseptic cleaner which kills germs and bonds with the skin to continue killing germs even after washing.    Oral Hygiene is also important to reduce your risk of infection.  Remember - BRUSH YOUR TEETH THE MORNING OF SURGERY WITH YOUR REGULAR TOOTHPASTE  Please do not use if you have an allergy to CHG or antibacterial soaps. If your skin becomes reddened/irritated stop using the CHG.  Do not shave (including legs and underarms) for at least 48 hours prior to first CHG shower. It is OK to shave your face.  Please follow these instructions carefully.   1. Shower the NIGHT BEFORE SURGERY and the MORNING OF SURGERY with CHG Soap.   2. If you chose to wash your hair, wash your hair first as usual with your normal shampoo.  3. After you shampoo, rinse your hair and body thoroughly to remove the shampoo.  4. Use CHG as you would any other liquid soap. You can apply CHG directly to the skin and wash gently with a scrungie or a clean  washcloth.   5. Apply the CHG Soap to your body ONLY FROM THE NECK DOWN.  Do not use on open wounds or open sores. Avoid contact with your eyes, ears, mouth and genitals (private parts). Wash Face and genitals (private parts)  with your normal soap.   6. Wash thoroughly, paying special attention to the area where your surgery will be performed.  7. Thoroughly rinse your body with warm water from the neck down.  8. DO NOT shower/wash with your normal soap after using and rinsing off the CHG Soap.  9. Pat yourself dry with a CLEAN TOWEL.  10. Wear CLEAN PAJAMAS to bed the night before surgery  11. Place CLEAN SHEETS on your bed the night of your first shower and DO NOT SLEEP WITH PETS.  Day of Surgery: Wear Clean/Comfortable clothing the morning of surgery Do not apply any deodorants/lotions.    Remember to brush your teeth WITH YOUR REGULAR TOOTHPASTE.   Please read over the following fact sheets that you were given.

## 2020-05-04 ENCOUNTER — Encounter (HOSPITAL_COMMUNITY)
Admission: RE | Admit: 2020-05-04 | Discharge: 2020-05-04 | Disposition: A | Discharge status: Home / Self Care | Payer: Medicare Other | Source: Ambulatory Visit | Attending: Orthopedic Surgery | Admitting: Orthopedic Surgery

## 2020-05-04 ENCOUNTER — Other Ambulatory Visit: Payer: Self-pay

## 2020-05-04 ENCOUNTER — Encounter (HOSPITAL_COMMUNITY): Payer: Self-pay

## 2020-05-04 DIAGNOSIS — Z6834 Body mass index (BMI) 34.0-34.9, adult: Secondary | ICD-10-CM | POA: Insufficient documentation

## 2020-05-04 DIAGNOSIS — I341 Nonrheumatic mitral (valve) prolapse: Secondary | ICD-10-CM | POA: Diagnosis not present

## 2020-05-04 DIAGNOSIS — Z79899 Other long term (current) drug therapy: Secondary | ICD-10-CM | POA: Diagnosis not present

## 2020-05-04 DIAGNOSIS — Z955 Presence of coronary angioplasty implant and graft: Secondary | ICD-10-CM | POA: Diagnosis not present

## 2020-05-04 DIAGNOSIS — K219 Gastro-esophageal reflux disease without esophagitis: Secondary | ICD-10-CM | POA: Diagnosis not present

## 2020-05-04 DIAGNOSIS — Z7901 Long term (current) use of anticoagulants: Secondary | ICD-10-CM | POA: Insufficient documentation

## 2020-05-04 DIAGNOSIS — I480 Paroxysmal atrial fibrillation: Secondary | ICD-10-CM | POA: Diagnosis not present

## 2020-05-04 DIAGNOSIS — M48061 Spinal stenosis, lumbar region without neurogenic claudication: Secondary | ICD-10-CM | POA: Diagnosis not present

## 2020-05-04 DIAGNOSIS — M961 Postlaminectomy syndrome, not elsewhere classified: Secondary | ICD-10-CM | POA: Insufficient documentation

## 2020-05-04 DIAGNOSIS — I2510 Atherosclerotic heart disease of native coronary artery without angina pectoris: Secondary | ICD-10-CM | POA: Diagnosis not present

## 2020-05-04 DIAGNOSIS — E669 Obesity, unspecified: Secondary | ICD-10-CM | POA: Insufficient documentation

## 2020-05-04 DIAGNOSIS — I10 Essential (primary) hypertension: Secondary | ICD-10-CM | POA: Insufficient documentation

## 2020-05-04 DIAGNOSIS — Z01818 Encounter for other preprocedural examination: Secondary | ICD-10-CM | POA: Insufficient documentation

## 2020-05-04 DIAGNOSIS — R7301 Impaired fasting glucose: Secondary | ICD-10-CM | POA: Insufficient documentation

## 2020-05-04 DIAGNOSIS — R251 Tremor, unspecified: Secondary | ICD-10-CM | POA: Diagnosis not present

## 2020-05-04 LAB — BASIC METABOLIC PANEL
Anion gap: 13 (ref 5–15)
BUN: 19 mg/dL (ref 8–23)
CO2: 25 mmol/L (ref 22–32)
Calcium: 9.2 mg/dL (ref 8.9–10.3)
Chloride: 99 mmol/L (ref 98–111)
Creatinine, Ser: 1.2 mg/dL — ABNORMAL HIGH (ref 0.44–1.00)
GFR, Estimated: 47 mL/min — ABNORMAL LOW (ref >60)
Glucose, Bld: 103 mg/dL — ABNORMAL HIGH (ref 70–99)
Potassium: 3.4 mmol/L — ABNORMAL LOW (ref 3.5–5.1)
Sodium: 137 mmol/L (ref 135–145)

## 2020-05-04 LAB — URINALYSIS, ROUTINE W REFLEX MICROSCOPIC
Bilirubin Urine: NEGATIVE
Glucose, UA: NEGATIVE mg/dL
Hgb urine dipstick: NEGATIVE
Ketones, ur: NEGATIVE mg/dL
Nitrite: NEGATIVE
Protein, ur: NEGATIVE mg/dL
Specific Gravity, Urine: 1.011 (ref 1.005–1.030)
pH: 5 (ref 5.0–8.0)

## 2020-05-04 LAB — CBC
HCT: 42.5 % (ref 36.0–46.0)
Hemoglobin: 13.6 g/dL (ref 12.0–15.0)
MCH: 31.9 pg (ref 26.0–34.0)
MCHC: 32 g/dL (ref 30.0–36.0)
MCV: 99.8 fL (ref 80.0–100.0)
Platelets: 228 10*3/uL (ref 150–400)
RBC: 4.26 MIL/uL (ref 3.87–5.11)
RDW: 14.1 % (ref 11.5–15.5)
WBC: 7.5 10*3/uL (ref 4.0–10.5)
nRBC: 0 % (ref 0.0–0.2)

## 2020-05-04 LAB — SURGICAL PCR SCREEN
MRSA, PCR: NEGATIVE
Staphylococcus aureus: NEGATIVE

## 2020-05-04 LAB — APTT: aPTT: 53 seconds — ABNORMAL HIGH (ref 24–36)

## 2020-05-04 LAB — PROTIME-INR
INR: 1.4 — ABNORMAL HIGH (ref 0.8–1.2)
Prothrombin Time: 16.8 seconds — ABNORMAL HIGH (ref 11.4–15.2)

## 2020-05-04 IMAGING — CR DG CHEST 2V
2 series · 2 of 2 positions shown · non-contrast
Comparison: [DATE].

CLINICAL DATA: Preop for lumbar surgery.

EXAM:
CHEST - 2 VIEW

[w chest pa]
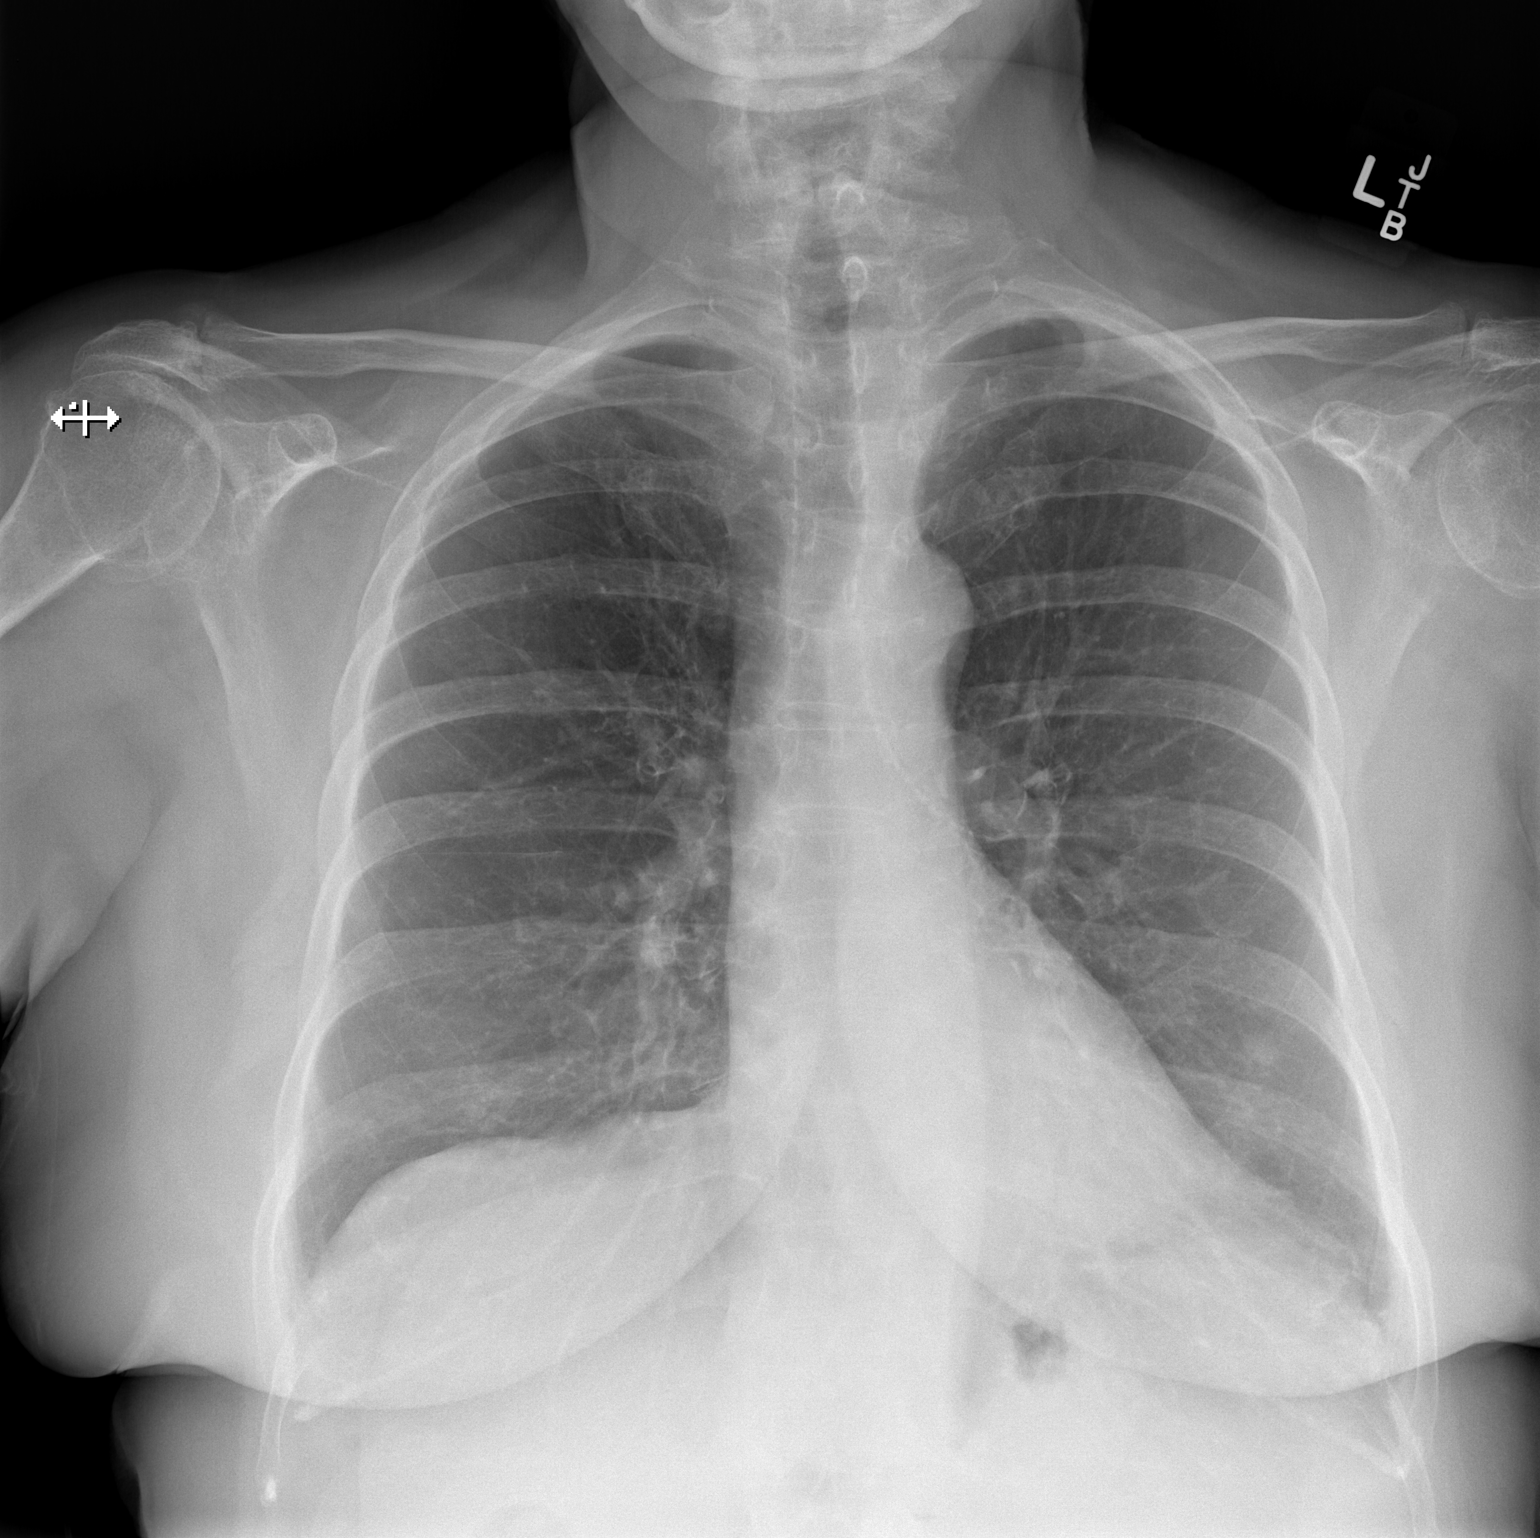

[w chest lat]
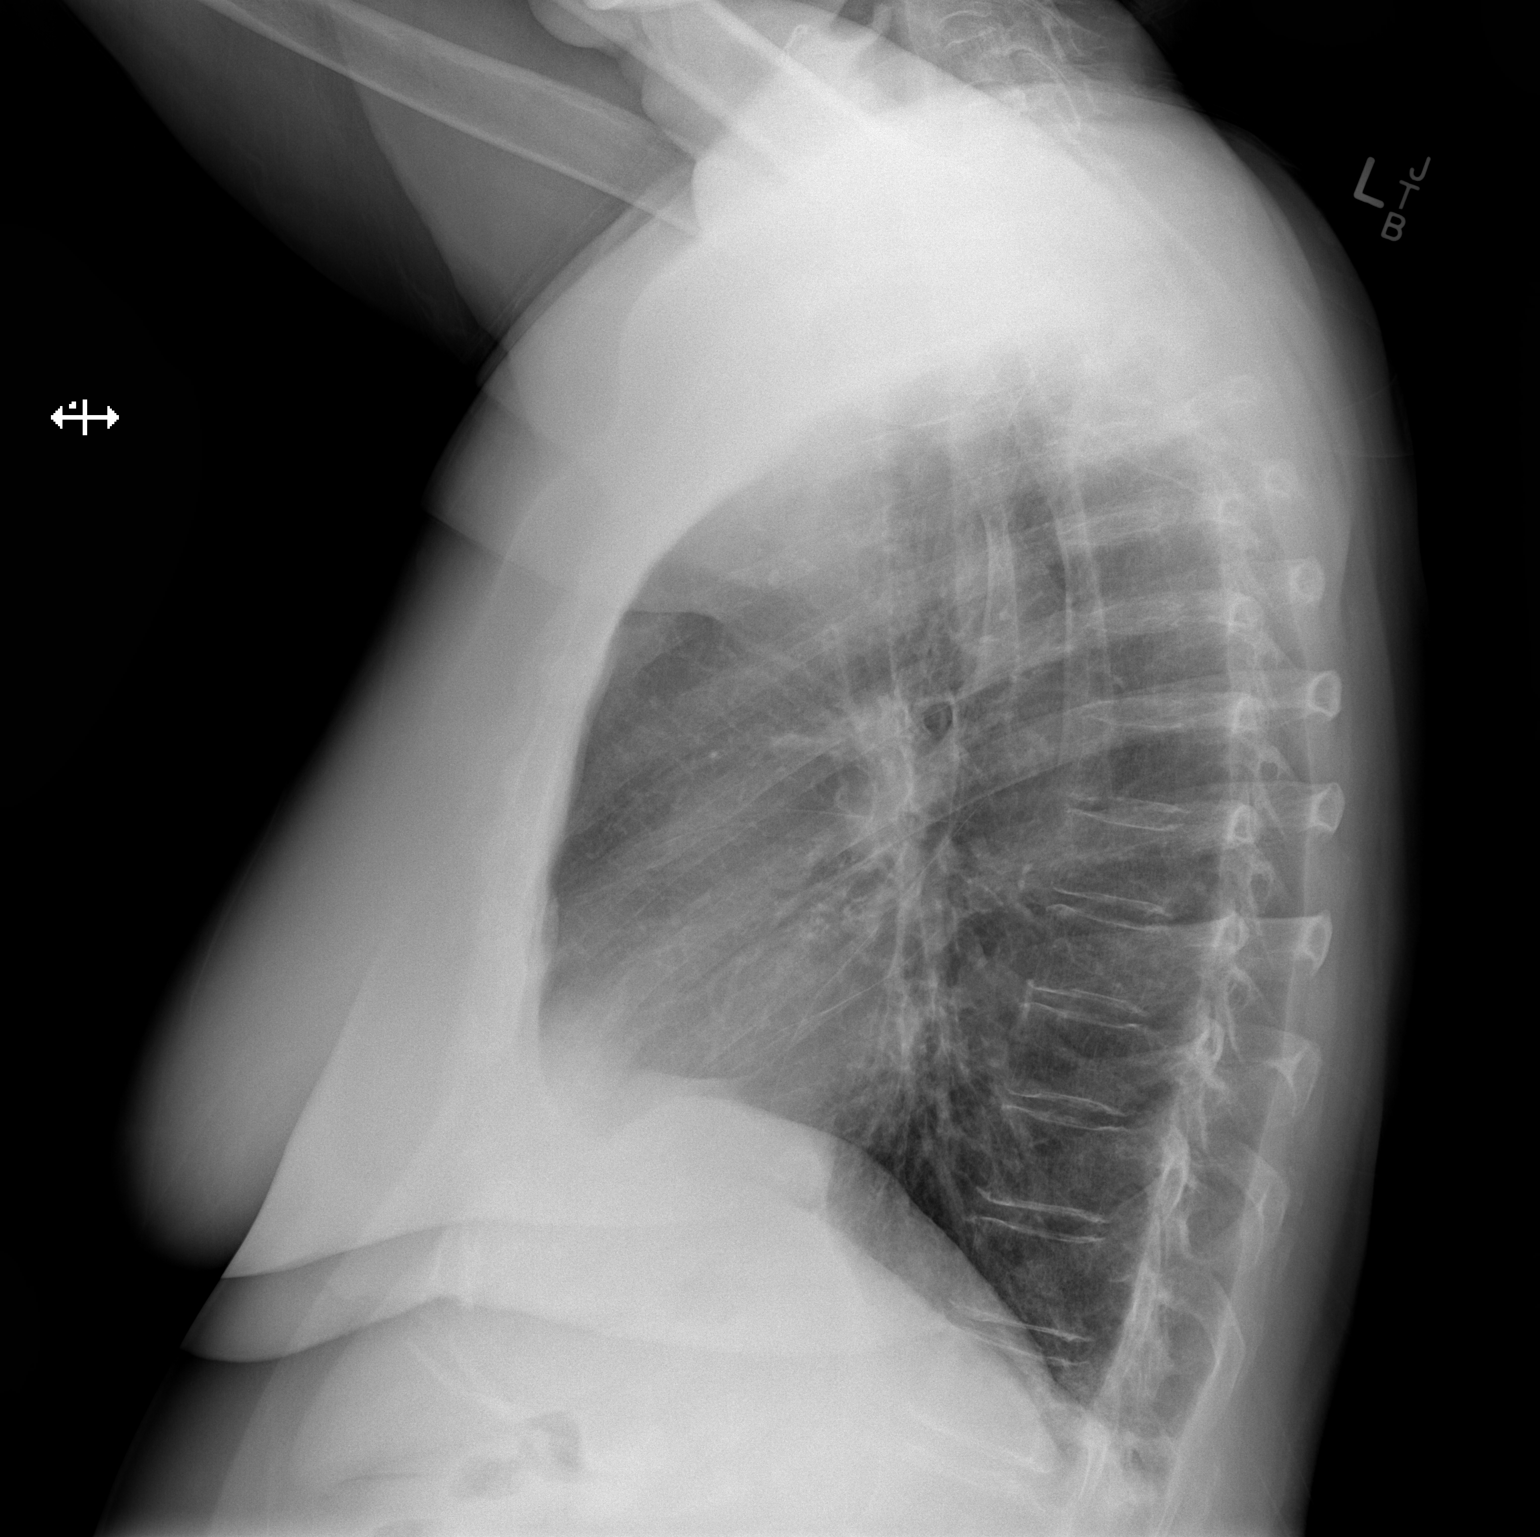

[2 of 2 positions shown; findings below may reference images not displayed]

FINDINGS: The heart size and mediastinal contours are within normal limits.
Nodular density seen over left lower lobe which may represent
overlying nipple shadow. No consolidative process is noted. The
visualized skeletal structures are unremarkable.
IMPRESSION: Nodular density seen over left lower lobe which may represent
overlying nipple shadow. Repeat radiograph nipple markers is
recommended to rule out pulmonary nodule.

## 2020-05-04 NOTE — Progress Notes (Signed)
PCP - Earley Brooke, PA-c Cardiologist - Dr. Thompson Grayer  PPM/ICD - denies  Chest x-ray - 05/04/2020 EKG - 04/18/2020 Stress Test - 2014 ECHO - 11/05/2019 Cardiac Cath - 10/26/2019  Sleep Study - denies CPAP - N/A  DM: denies  Blood Thinner Instructions: PRADAXA -  Per patient last dose to be taken on 05/07/2020 Aspirin Instructions: N/A  ERAS Protcol - No  COVID TEST- Scheduled for 11/8/201. Patient verbalized understanding of self-quarantine instructions, appointment time and place.  Anesthesia review: YES, per MD order  Patient denies shortness of breath, fever, cough and chest pain at PAT appointment  All instructions explained to the patient, with a verbal understanding of the material. Patient agrees to go over the instructions while at home for a better understanding. Patient also instructed to self quarantine after being tested for COVID-19. The opportunity to ask questions was provided.

## 2020-05-04 NOTE — Progress Notes (Signed)
Abnormal labs have resulted.  Jarvis Newcomer., PA-c notified via [employer].

## 2020-05-05 ENCOUNTER — Telehealth: Payer: Self-pay | Admitting: Internal Medicine

## 2020-05-05 ENCOUNTER — Ambulatory Visit: Payer: Self-pay | Admitting: Orthopedic Surgery

## 2020-05-05 LAB — TYPE AND SCREEN
ABO/RH(D): A POS
Antibody Screen: NEGATIVE

## 2020-05-05 NOTE — Progress Notes (Addendum)
Anesthesia Chart Review:  Case: [id number] Date/Time: 05/11/20 0815   Procedures:      OBLIQUE LUMBAR INTERBODY FUSION (OLIF) L4-5, ANTERIOR LATERAL LUMBAR INTERBODY FUSION (XLIF) L3-4 (N/A ) - 4 hrs Dr. 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 She is then scheduled for L3-5 PLIF on 05/12/20 by Dr. Rolena Infante.  History includes former smoker (quit 07/02/88), CAD (DES mRCA 04/04/09), afib/PAF (s/p ablation 08/15/10), MVP, HTN, tremor, hiatal hernia, GERD, impaired fasting glucose, sacral nerve stimulator (03/2014, removal 06/04/18), back surgery. BMI is consistent with obesity.  Preoperative assessment by Dr. Rayann Heman on 04/18/20: "She is moderate risk for surgery Proceed if medically necessary without further CV testing Ok to hold pradaxa up to 3 days prior to the procedure and resume when able post operatively." She had non-obstructive CAD by 10/2019 cath. LVEF normal. Currently last Pradaxa dose is scheduled for 05/07/20.   Her Cr is 1.20 with eGFR is 47 (which is consistent with previous results). PT 16.8, INR 1.4, and aPTT 53, but likely due to her still being on Pradaxa. She will need repeat PT/PTT prior to surgery.  Cardiology instructed to hold Pradaxa for 3 days prior to lumber fusion, but given elevated PT/PTT and eGFR, I called results to [facility] at Dr. Rolena Infante office so he or his APP can review, and if they feel indicated then they can further clarify with cardiology. If Dr. Rolena Infante would like repeat PT/PTT done before the day of surgery then asked his staff to contact our scheduler.   Preoperative COVID-19 test is scheduled for 05/09/20.   UPDATE 05/04/20 3:58 PM:  05/04/20 CXR report now  available and showed LLL nodular density which may represent overlying nipple shadow. Repeat radiograph with nipple markers recommended to rule out pulmonary nodule. Ward, Estill Bamberg, PA-C has reviewed with patient and advised primary care follow-up for CXR with nipple markers. Patient reportedly said she had previous work-up for pulmonology nodules. (In review of Care Everywhere notes, she had stable small bilateral pulmonary nodules on 04/24/12 since 07/05/06 imaging.) Defer follow-up testing to PCP and/or surgeon. COVID-19 test in process.      VS: BP 119/70   Pulse 74   Temp 36.8 C   Resp 18   Ht 5' 2" (1.575 m)   Wt 86.1 kg   SpO2 99%   BMI 34.71 kg/m     PROVIDERS: Fransisca Connors, PA-C is PCP Thompson Grayer, MD is EP cardiologist. Last visit 04/18/20. 12 month follow-up recommended. He also referred her back to general cardiology to get re-established.    LABS: Labs reviewed: Repeat  STAT PT/PTT on the day of surgery.  PT/INR 16.8/1.4 and PTT 53. Patient on Pradaxa at PAT.  (all labs ordered are listed, but only abnormal results are displayed)  Labs Reviewed  APTT - Abnormal; Notable for the following components:      Result Value   aPTT 53 (*)    All other components within normal limits  BASIC METABOLIC PANEL - Abnormal; Notable for the following components:   Potassium 3.4 (*)    Glucose, Bld 103 (*)    Creatinine, Ser 1.20 (*)  GFR, Estimated 47 (*)    All other components within normal limits  PROTIME-INR - Abnormal; Notable for the following components:   Prothrombin Time 16.8 (*)    INR 1.4 (*)    All other components within normal limits  URINALYSIS, ROUTINE W REFLEX MICROSCOPIC - Abnormal; Notable for the following components:   Leukocytes,Ua SMALL (*)    Bacteria, UA RARE (*)    All other components within normal limits  SURGICAL PCR SCREEN  CBC  TYPE AND SCREEN     IMAGES: CXR 05/04/20: FINDINGS: The heart size and mediastinal contours are within normal  limits. Nodular density seen over left lower lobe which may represent overlying nipple shadow. No consolidative process is noted. The visualized skeletal structures are unremarkable. IMPRESSION: Nodular density seen over left lower lobe which may represent overlying nipple shadow. Repeat radiograph nipple markers is recommended to rule out pulmonary nodule.   EKG: 04/18/20: SR   CV: Echo 11/05/19: IMPRESSIONS  1. Left ventricular ejection fraction, by estimation, is 55 to 60%. The  left ventricle has normal function. The left ventricle has no regional  wall motion abnormalities. Left ventricular diastolic parameters are  consistent with Grade I diastolic  dysfunction (impaired relaxation).  2. Right ventricular systolic function is normal. The right ventricular  size is normal. There is normal pulmonary artery systolic pressure.  3. The mitral valve is normal in structure. Mild mitral valve  regurgitation. No evidence of mitral stenosis.  4. The aortic valve is normal in structure. Aortic valve regurgitation is  not visualized. No aortic stenosis is present.   Cardiac cath 10/26/19:  Ramus lesion is 35% stenosed.  Prox RCA to Mid RCA lesion is 25% stenosed.  Previously placed Mid RCA-1 stent (unknown type) is widely patent.  Mid RCA-2 lesion is 25% stenosed.  The left ventricular systolic function is normal.  LV end diastolic pressure is normal.  The left ventricular ejection fraction is 55-65% by visual estimate. 1. Nonobstructive CAD.  2. Normal LV function 3. Normal LVEDP Plan: continue medical management. May resume Pradaxa tomorrow am.   Cardiac event monitor in 06/03/13-07/02/13 showed NSR, no afib.  Past Medical History:  Diagnosis Date  . Anticoagulated    Pradaxa  . Bilateral leg pain    Right leg has greater pain than the left.  . Coronary artery disease cardiologist-- dr Rayann Heman   a.  s/p Xience DES to RCA 04/2009;   b. TEE 2/12: EF 40%, Large PFO;   c.  Lexiscan Myoview 05/2012: EF 69%, no ischemia. LHC (05/2012):  Ostial diagonal 30-40%, proximal mid ramus intermedius 40-50%, RCA stent patent with 40-50% after stent, then 40%, distal RCA 40-50%, EF 55-65%. Medical therapy continued.;  d.  Carlton Adam Myoview (06/2013):  No ischemia, EF 83%, normal study  . Full dentures   . GERD (gastroesophageal reflux disease)   . History of basal cell carcinoma excision    scalp  . History of hiatal hernia   . History of kidney stones 51 years ago   x1  . History of recurrent UTIs   . Hypertension   . IFG (impaired fasting glucose)   . MVP (mitral valve prolapse)    mild per last echo 04-03-2017 in epic  . OA (osteoarthritis)    knees , fingers  . PAF (paroxysmal atrial fibrillation) [facility])    cardiologist-- dr Rayann Heman  . S/P ablation of atrial fibrillation 08/15/2010  . S/P drug eluting coronary stent placement 04/04/2009  . Spinal stenosis   .  Tremor   . Wears glasses     Past Surgical History:  Procedure Laterality Date  . ANAL RECTAL MANOMETRY N/A 01/11/2014   Procedure: ANAL RECTAL MANOMETRY;  Surgeon: Leighton Ruff, MD;  Location: [facility];  Service: Endoscopy;  Laterality: N/A;  . APPENDECTOMY  1973  . BACK SURGERY     laminectomy times 2; 2000 and 2001  . BUNIONECTOMY Bilateral 2013  . CARDIAC CATHETERIZATION  10-24-2010   dr allred   singl-vessel CAD with patent stent mRCA/  moderate disease mRCA beyond stent segment/  normal lvsf  . CARDIAC ELECTROPHYSIOLOGY MAPPING AND ABLATION  08-15-2010  dr allred  . CARDIOVASCULAR STRESS TEST  06-15-2013  dr allred   normal perfusion study/  no ischemia/  ef 83%  . CARPAL TUNNEL RELEASE Right 25 years ago  . CATARACT EXTRACTION W/ INTRAOCULAR LENS  IMPLANT, BILATERAL    . CHOLECYSTECTOMY OPEN  1973   W/  APPENDECTOMY  . CORONARY ANGIOPLASTY WITH STENT PLACEMENT  04-04-2009  dr Darnell Level brodie   PCI and DES x1 to  mRCA/  mLAD 40%, pCX 30%, pRCA 30%/  normal LVF  . FLEXIBLE SIGMOIDOSCOPY  N/A 07/07/2013   Procedure: FLEXIBLE SIGMOIDOSCOPY;  Surgeon: Winfield Cunas., MD;  Location: [facility];  Service: Endoscopy;  Laterality: N/A;  unprepped  . LEFT HEART CATH AND CORONARY ANGIOGRAPHY N/A 10/26/2019   Procedure: LEFT HEART CATH AND CORONARY ANGIOGRAPHY;  Surgeon: Martinique, Peter M, MD;  Location: [facility] CV LAB;  Service: Cardiovascular;  Laterality: N/A;  . LEFT HEART CATHETERIZATION WITH CORONARY ANGIOGRAM N/A 05/09/2012   Procedure: LEFT HEART CATHETERIZATION WITH CORONARY ANGIOGRAM;  Surgeon: Hillary Bow, MD;  Location: [facility] CATH LAB;  Service: Cardiovascular;  Laterality: N/A;  . MOHS SURGERY  2009   scalp  . PILONIDAL CYST EXCISION  2009;   2000;   1999  . RECTAL ULTRASOUND N/A 01/11/2014   Procedure: RECTAL ULTRASOUND;  Surgeon: Leighton Ruff, MD;  Location: [facility];  Service: Endoscopy;  Laterality: N/A;  . TOTAL KNEE ARTHROPLASTY Right 09/07/2014   Procedure: RIGHT TOTAL KNEE ARTHROPLASTY;  Surgeon: Paralee Cancel, MD;  Location: [facility];  Service: Orthopedics;  Laterality: Right;  . TOTAL KNEE ARTHROPLASTY Left 12/20/2015   Procedure: LEFT TOTAL KNEE ARTHROPLASTY;  Surgeon: Paralee Cancel, MD;  Location: [facility];  Service: Orthopedics;  Laterality: Left;  . TRANSTHORACIC ECHOCARDIOGRAM  04-03-2017   dr allred   Leory Plowman 55-60%/  mild MVP anterior leaflet (valve area 2.1cm^2) with mild regurg. , no stenosis/  trivial TR and PR  . VAGINAL HYSTERECTOMY  1975   W/ BILATERAL SALPINOOPHOORECTOMY  . VAGUS NERVE STIMULATOR INSERTION N/A 03/10/2014   Procedure: IMPLANTATION OF SACRAL  NERVE STIMULATOR ;  Surgeon: Leighton Ruff, MD;  Location: [facility];  Service: General;  Laterality: N/A;  Sacral. [employer]    MEDICATIONS: . diphenhydrAMINE (BENADRYL) 25 mg capsule  . hydrochlorothiazide (HYDRODIURIL) 12.5 MG tablet  . isosorbide mononitrate (IMDUR) 30 MG 24 hr tablet  . lisinopril (PRINIVIL,ZESTRIL) 10 MG tablet  . metoprolol succinate (TOPROL-XL) 25 MG 24 hr  tablet  . nitroGLYCERIN (NITROSTAT) 0.4 MG SL tablet  . pantoprazole (PROTONIX) 40 MG tablet  . PRADAXA 150 MG CAPS capsule  . primidone (MYSOLINE) 50 MG tablet  . rosuvastatin (CRESTOR) 40 MG tablet  . traZODone (DESYREL) 50 MG tablet   No current facility-administered medications for this encounter.    Myra Gianotti, PA-C Surgical Short Stay/Anesthesiology [facility] Phone [phone] [facility] Phone [phone]  05/05/2020 12:28 PM

## 2020-05-05 NOTE — Telephone Encounter (Signed)
Per Estill Bamberg at Dr. Valla Leaver office, the patient was cleared for surgery through her appt with Dr. Rayann Heman on 04/18/20. He advised that Annikah could hold her Pradaxa 3 days prior to surgery. Dr. Rolena Infante is requesting to hold the medication 5 days prior. Please advise.   Please call and fax clearance Phone: [phone] Estill Bamberg) Fax: [phone] ATTN: [place]

## 2020-05-05 NOTE — Anesthesia Preprocedure Evaluation (Deleted)
Anesthesia Evaluation    Airway        Dental   Pulmonary former smoker,           Cardiovascular hypertension,      Neuro/Psych    GI/Hepatic   Endo/Other    Renal/GU      Musculoskeletal   Abdominal   Peds  Hematology   Anesthesia Other Findings   Reproductive/Obstetrics                                                              Anesthesia Evaluation  Patient identified by MRN, date of birth, ID band Patient awake    Reviewed: Allergy & Precautions, NPO status , Patient's Chart, lab work & pertinent test results  Airway Mallampati: III  TM Distance: >3 FB Neck ROM: Full    Dental  (+) Edentulous Upper, Partial Lower   Pulmonary neg pulmonary ROS, former smoker,    breath sounds clear to auscultation       Cardiovascular hypertension, Pt. on medications and Pt. on home beta blockers + CAD, + Cardiac Stents (2010) and +CHF  + dysrhythmias (s/p ablation) Atrial Fibrillation  Rhythm:Regular Rate:Normal  TTE 2018 - Left ventricle: Systolic function was normal. The estimated   ejection fraction was in the range of 60% to 65%. - Aortic valve: Mildly thickened, mildly calcified leaflets. - Mitral valve: Mild prolapse, involving the anterior leaflet.   There was mild regurgitation  Stress Test 2014 normal   Neuro/Psych negative neurological ROS  negative psych ROS   GI/Hepatic hiatal hernia, GERD  Medicated,  Endo/Other    Renal/GU      Musculoskeletal  (+) Arthritis ,   Abdominal   Peds  Hematology negative hematology ROS (+)   Anesthesia Other Findings On pradaxa, last dose Saturday  Reproductive/Obstetrics                            Anesthesia Physical Anesthesia Plan  ASA: III  Anesthesia Plan: MAC   Post-op Pain Management:    Induction: Intravenous  PONV Risk Score and Plan: 2 and Propofol infusion, Treatment may  vary due to age or medical condition and Ondansetron  Airway Management Planned: Natural Airway and Simple Face Mask  Additional Equipment:   Intra-op Plan:   Post-operative Plan:   Informed Consent: I have reviewed the patients History and Physical, chart, labs and discussed the procedure including the risks, benefits and alternatives for the proposed anesthesia with the patient or authorized representative who has indicated his/her understanding and acceptance.   Dental advisory given  Plan Discussed with: CRNA  Anesthesia Plan Comments:         Anesthesia Quick Evaluation  Anesthesia Physical Anesthesia Plan  ASA:   Anesthesia Plan:    Post-op Pain Management:    Induction:   PONV Risk Score and Plan:   Airway Management Planned:   Additional Equipment:   Intra-op Plan:   Post-operative Plan:   Informed Consent:   Plan Discussed with:   Anesthesia Plan Comments: (PAT note written 05/05/2020 by Myra Gianotti, PA-C. )        Anesthesia Quick Evaluation

## 2020-05-06 NOTE — Telephone Encounter (Signed)
Spoke to [place] and let her know that I faxed over Dr. Jackalyn Lombard last office visit with pre op recommendations. (Listed below)  6. preop She is moderate risk for surgery Proceed if medically necessary without further CV testing Ok to hold pradaxa up to 3 days prior to the procedure and resume when able post operatively.  Advised that Dr. Rayann Heman recommended holding Pradaxa for 3 days and that he defers any change to that timeframe to Dr. Rolena Infante.   Estill Bamberg verbalized understanding

## 2020-05-09 ENCOUNTER — Other Ambulatory Visit (HOSPITAL_COMMUNITY)
Admission: RE | Admit: 2020-05-09 | Discharge: 2020-05-09 | Disposition: A | Discharge status: Home / Self Care | Payer: Medicare Other | Source: Ambulatory Visit | Attending: Orthopedic Surgery | Admitting: Orthopedic Surgery

## 2020-05-09 DIAGNOSIS — Z01812 Encounter for preprocedural laboratory examination: Secondary | ICD-10-CM | POA: Insufficient documentation

## 2020-05-09 DIAGNOSIS — Z20822 Contact with and (suspected) exposure to covid-19: Secondary | ICD-10-CM | POA: Insufficient documentation

## 2020-05-09 LAB — SARS CORONAVIRUS 2 (TAT 6-24 HRS): SARS Coronavirus 2: NEGATIVE

## 2020-05-10 ENCOUNTER — Other Ambulatory Visit: Payer: Self-pay | Admitting: Internal Medicine

## 2020-05-10 NOTE — Anesthesia Preprocedure Evaluation (Addendum)
Anesthesia Evaluation  Patient identified by MRN, date of birth, ID band Patient awake    Reviewed: Allergy & Precautions, NPO status , Patient's Chart, lab work & pertinent test results  History of Anesthesia Complications Negative for: history of anesthetic complications  Airway Mallampati: I  TM Distance: >3 FB Neck ROM: Full    Dental  (+) Edentulous Upper, Partial Lower   Pulmonary neg pulmonary ROS, former smoker,    Pulmonary exam normal        Cardiovascular hypertension, Pt. on medications and Pt. on home beta blockers + CAD, + Cardiac Stents (2010) and +CHF  Normal cardiovascular exam+ dysrhythmias (s/p ablation) Atrial Fibrillation   Echo 11/05/19: IMPRESSIONS  1. Left ventricular ejection fraction, by estimation, is 55 to 60%. The  left ventricle has normal function. The left ventricle has no regional  wall motion abnormalities. Left ventricular diastolic parameters are  consistent with Grade I diastolic  dysfunction (impaired relaxation).  2. Right ventricular systolic function is normal. The right ventricular  size is normal. There is normal pulmonary artery systolic pressure.  3. The mitral valve is normal in structure. Mild mitral valve  regurgitation. No evidence of mitral stenosis.  4. The aortic valve is normal in structure. Aortic valve regurgitation is  not visualized. No aortic stenosis is present.   Cardiac cath 10/26/19:  Ramus lesion is 35% stenosed.  Prox RCA to Mid RCA lesion is 25% stenosed.  Previously placed Mid RCA-1 stent (unknown type) is widely patent.  Mid RCA-2 lesion is 25% stenosed.  The left ventricular systolic function is normal.  LV end diastolic pressure is normal.  The left ventricular ejection fraction is 55-65% by visual estimate. 1. Nonobstructive CAD.  2. Normal LV function 3. Normal LVEDP Plan: continue medical management. May resume Pradaxa tomorrow am.     Neuro/Psych negative neurological ROS  negative psych ROS   GI/Hepatic Neg liver ROS, hiatal hernia, GERD  Medicated,  Endo/Other  negative endocrine ROS  Renal/GU negative Renal ROS     Musculoskeletal  (+) Arthritis ,   Abdominal   Peds  Hematology negative hematology ROS (+)   Anesthesia Other Findings  Preoperative assessment by Dr. Rayann Heman on 04/18/20: "She is moderate risk for surgery Proceed if medically necessary without further CV testing Ok to hold pradaxa up to 3 days prior to the procedure and resume when able post operatively." She had non-obstructive CAD by 10/2019 cath. LVEF normal. Currently last Pradaxa dose is scheduled for 05/07/20.   Reproductive/Obstetrics                            Anesthesia Physical  Anesthesia Plan  ASA: III  Anesthesia Plan: General   Post-op Pain Management: GA combined w/ Regional for post-op pain   Induction: Intravenous  PONV Risk Score and Plan: 4 or greater and Treatment may vary due to age or medical condition, Ondansetron, Dexamethasone and Diphenhydramine  Airway Management Planned: Oral ETT  Additional Equipment:   Intra-op Plan:   Post-operative Plan: Extubation in OR  Informed Consent: I have reviewed the patients History and Physical, chart, labs and discussed the procedure including the risks, benefits and alternatives for the proposed anesthesia with the patient or authorized representative who has indicated his/her understanding and acceptance.     Dental advisory given  Plan Discussed with: Anesthesiologist and CRNA  Anesthesia Plan Comments: (PAT note written 05/05/2020 by Myra Gianotti, PA-C.)  Anesthesia Quick Evaluation  

## 2020-05-10 NOTE — Telephone Encounter (Signed)
Pt last saw Dr Rayann Heman 04/18/20, last labs 05/04/20 Creat 1.20, age 74, weight 86.1kg, CrCl 54.21, based on CrCl pt is on appropriate dosage of Pradaxa 150mg  BID.  Will refill rx.

## 2020-05-11 ENCOUNTER — Other Ambulatory Visit: Payer: Self-pay

## 2020-05-11 ENCOUNTER — Inpatient Hospital Stay (HOSPITAL_COMMUNITY)
Admission: RE | Admit: 2020-05-11 | Discharge: 2020-05-14 | DRG: 455 | Disposition: A | Discharge status: Home / Self Care | Payer: Medicare Other | Attending: Orthopedic Surgery | Admitting: Orthopedic Surgery

## 2020-05-11 ENCOUNTER — Inpatient Hospital Stay (HOSPITAL_COMMUNITY): Payer: Medicare Other | Admitting: Vascular Surgery

## 2020-05-11 ENCOUNTER — Encounter (HOSPITAL_COMMUNITY)
Admission: RE | Disposition: A | Discharge status: Home / Self Care | Payer: Self-pay | Source: Home / Self Care | Attending: Orthopedic Surgery

## 2020-05-11 ENCOUNTER — Inpatient Hospital Stay (HOSPITAL_COMMUNITY): Payer: Medicare Other

## 2020-05-11 ENCOUNTER — Encounter (HOSPITAL_COMMUNITY): Payer: Self-pay | Admitting: Orthopedic Surgery

## 2020-05-11 DIAGNOSIS — M961 Postlaminectomy syndrome, not elsewhere classified: Secondary | ICD-10-CM | POA: Diagnosis present

## 2020-05-11 DIAGNOSIS — M48062 Spinal stenosis, lumbar region with neurogenic claudication: Secondary | ICD-10-CM | POA: Diagnosis present

## 2020-05-11 DIAGNOSIS — M4316 Spondylolisthesis, lumbar region: Secondary | ICD-10-CM | POA: Diagnosis present

## 2020-05-11 DIAGNOSIS — Z20822 Contact with and (suspected) exposure to covid-19: Secondary | ICD-10-CM | POA: Diagnosis present

## 2020-05-11 DIAGNOSIS — Z419 Encounter for procedure for purposes other than remedying health state, unspecified: Principal | ICD-10-CM

## 2020-05-11 DIAGNOSIS — M5410 Radiculopathy, site unspecified: Secondary | ICD-10-CM | POA: Diagnosis present

## 2020-05-11 DIAGNOSIS — M47816 Spondylosis without myelopathy or radiculopathy, lumbar region: Secondary | ICD-10-CM | POA: Diagnosis present

## 2020-05-11 DIAGNOSIS — Z981 Arthrodesis status: Secondary | ICD-10-CM

## 2020-05-11 HISTORY — PX: ABDOMINAL EXPOSURE: SHX5708

## 2020-05-11 LAB — PROTIME-INR
INR: 1 (ref 0.8–1.2)
Prothrombin Time: 13 seconds (ref 11.4–15.2)

## 2020-05-11 IMAGING — CR DG OR LOCAL ABDOMEN
1 series · 1 of 1 positions shown · non-contrast
Comparison: None.

CLINICAL DATA: 74-year-old female lumbar surgery, plain for
instrument count

EXAM:
OR LOCAL ABDOMEN

[ap rld]
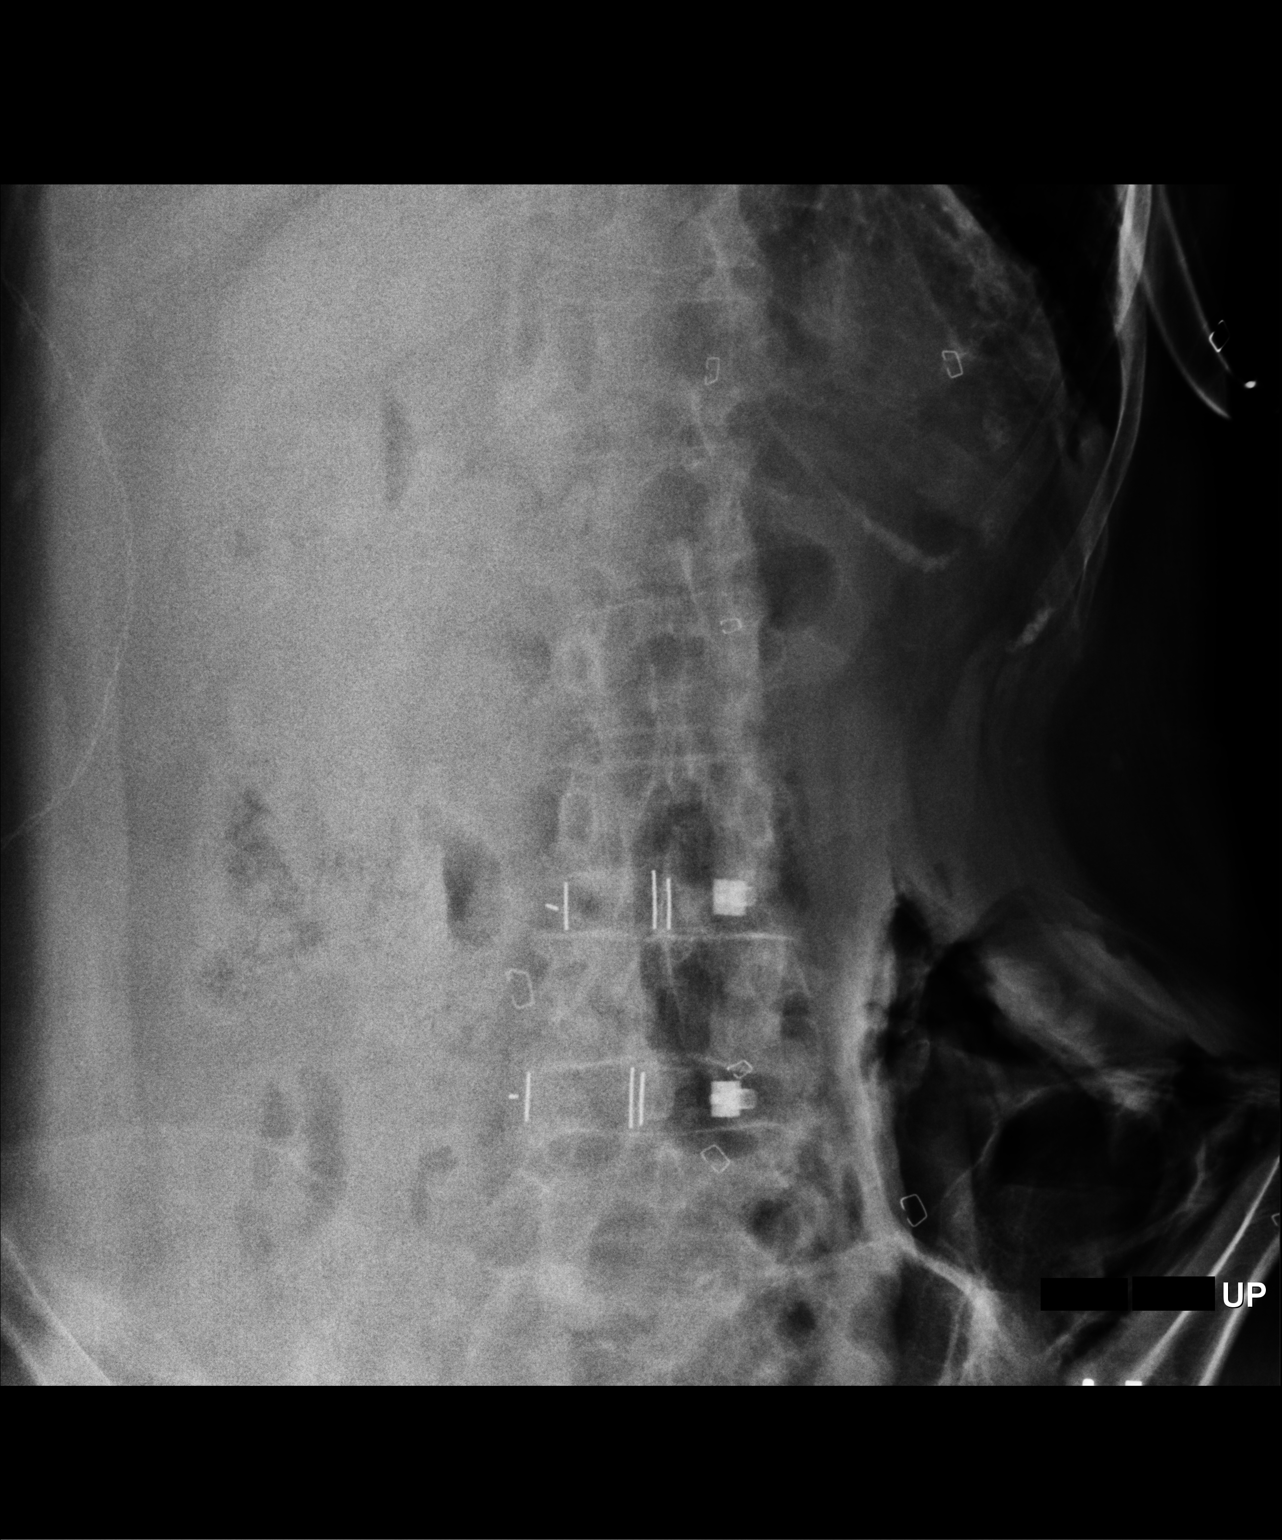

[1 of 1 positions shown; findings below may reference images not displayed]

FINDINGS: Limited intraoperative x-ray of the abdomen, status post lumbar
surgery.

Surgical staples project over the soft tissues. Gas within the soft
tissues of the left abdomen. No unexpected radiopaque foreign body.
Disc spacers at the L3-L4 and L4-L5 levels.
IMPRESSION: Intraoperative plain film, with no unexpected radiopaque foreign
body.

## 2020-05-11 IMAGING — RF DG LUMBAR SPINE 2-3V
1 series · 2 of 2 positions shown · non-contrast
Comparison: None.

CLINICAL DATA: Surgical fusion of L3-4 and L4-5.

EXAM:
LUMBAR SPINE - 2-3 VIEW; DG C-ARM 1-60 MIN
Radiation exposure index: 112.04 mGy.

[Series 1: run · 2 of 2 slices shown]
[im 1/2]
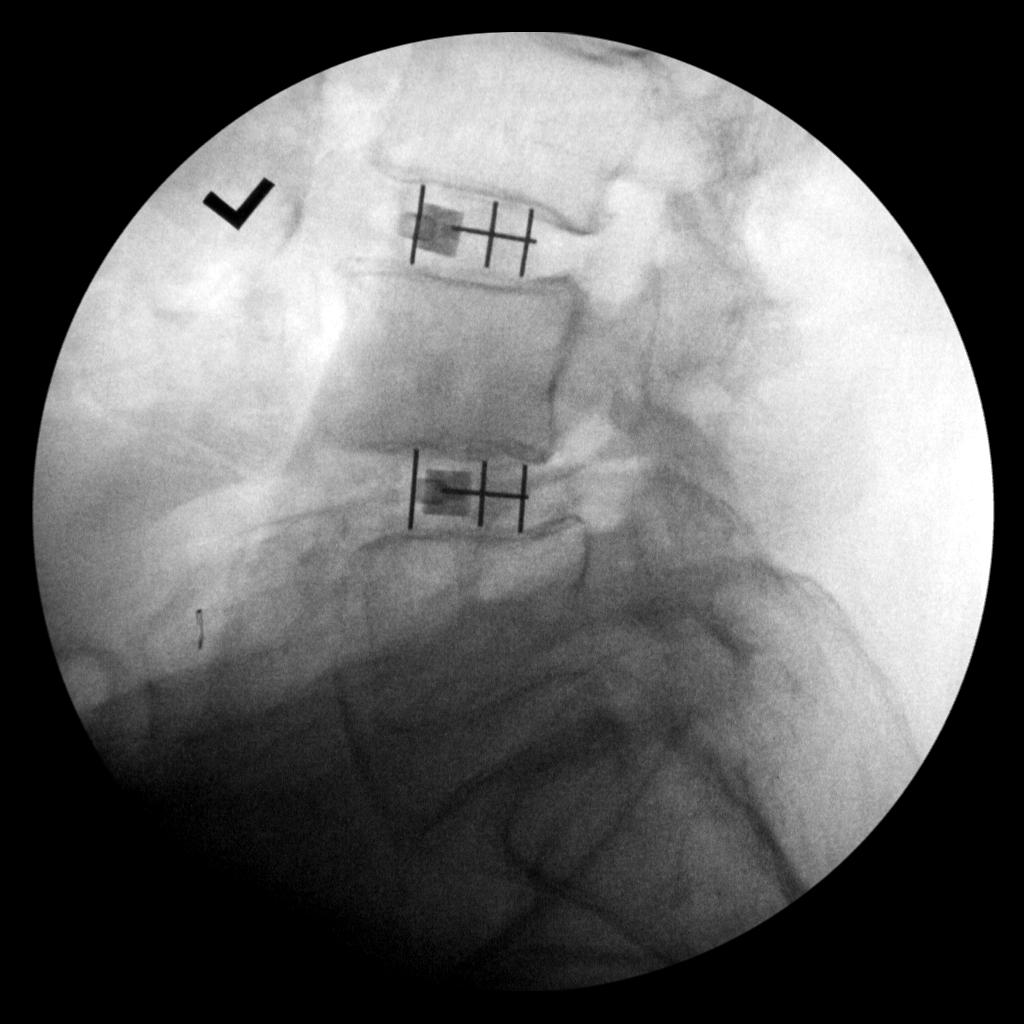
[im 2/2]
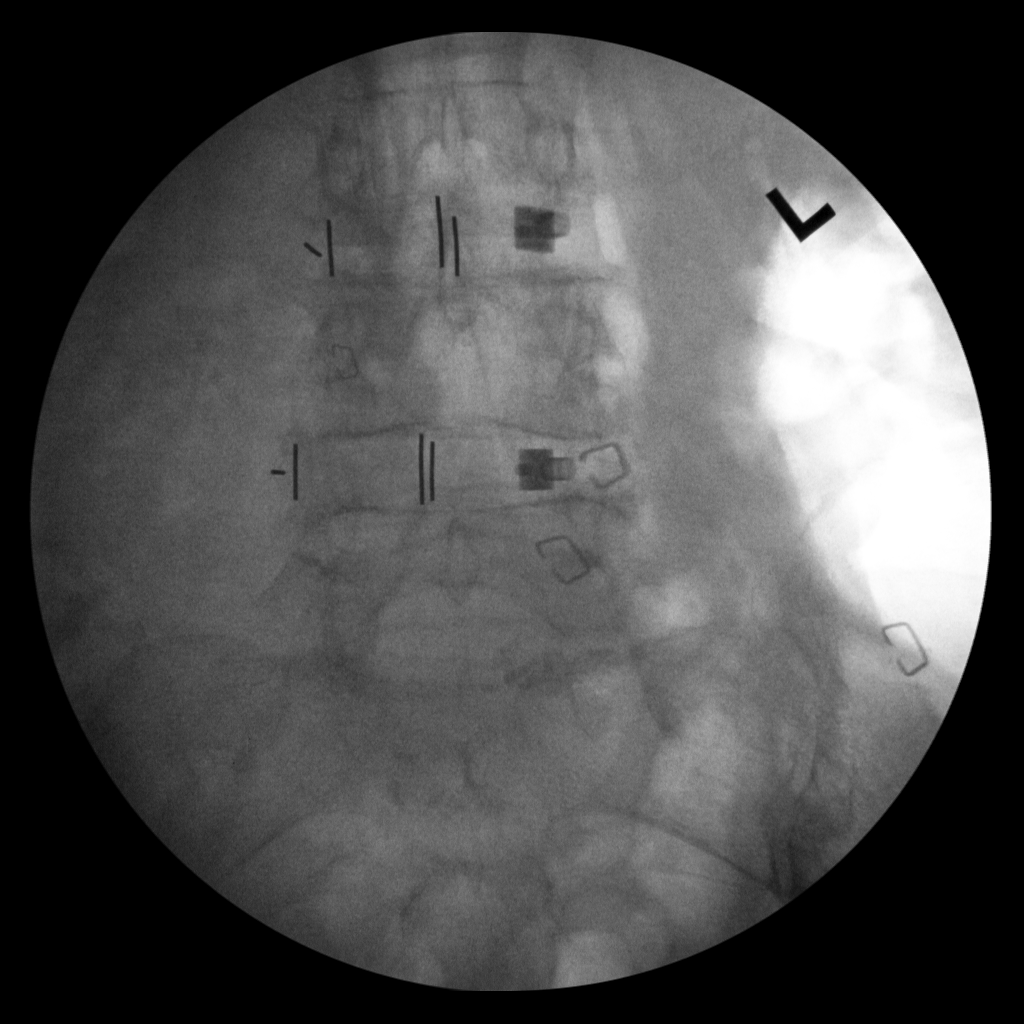

[2 of 2 positions shown; findings below may reference images not displayed]

FINDINGS: Two intraoperative fluoroscopic images were obtained of the lower
lumbar spine. These demonstrate the patient be status post interbody
fusion of L3-4 and L4-5.
IMPRESSION: Fluoroscopic guidance provided during lumbar fusion.

## 2020-05-11 IMAGING — RF DG C-ARM 1-60 MIN
1 series · 2 of 2 positions shown · non-contrast
Comparison: None.

CLINICAL DATA: Surgical fusion of L3-4 and L4-5.

EXAM:
LUMBAR SPINE - 2-3 VIEW; DG C-ARM 1-60 MIN
Radiation exposure index: 112.04 mGy.

[Series 1: run · 2 of 2 slices shown]
[im 1/2]
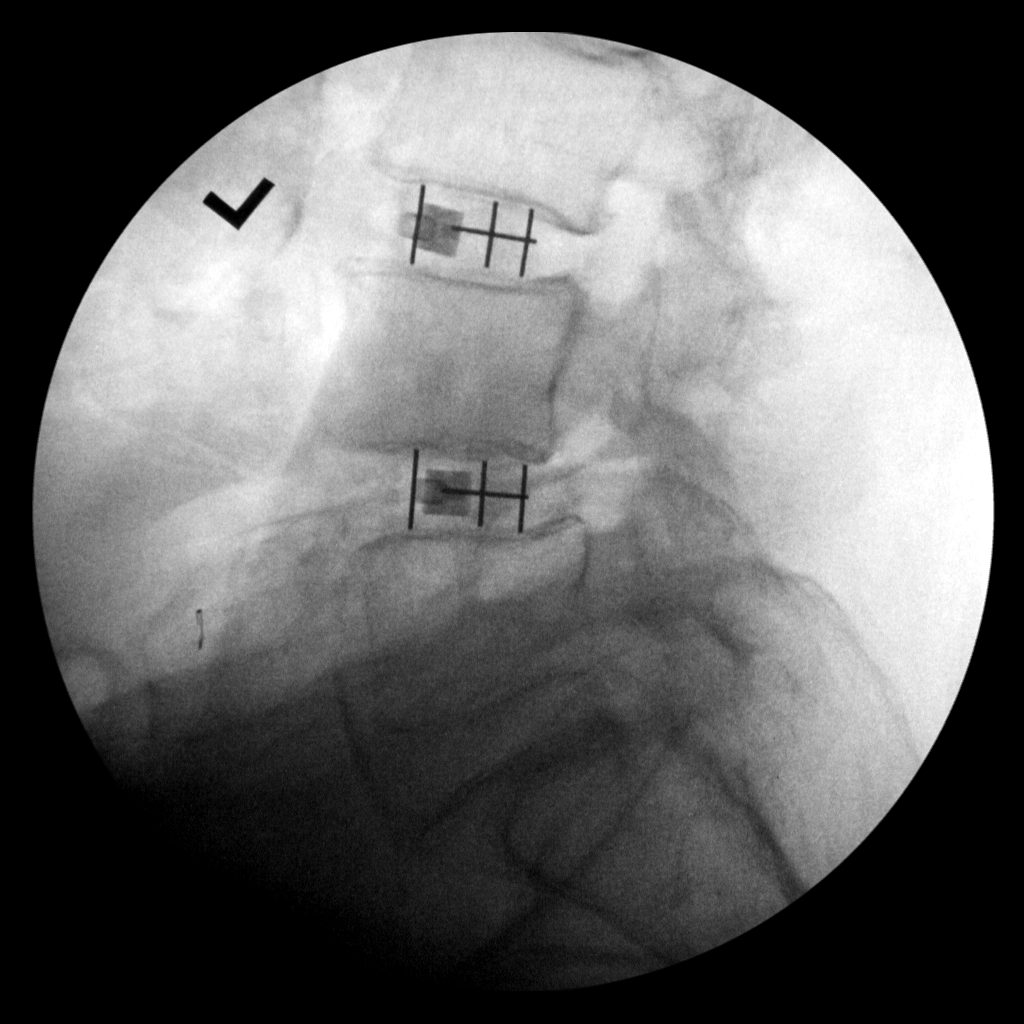
[im 2/2]
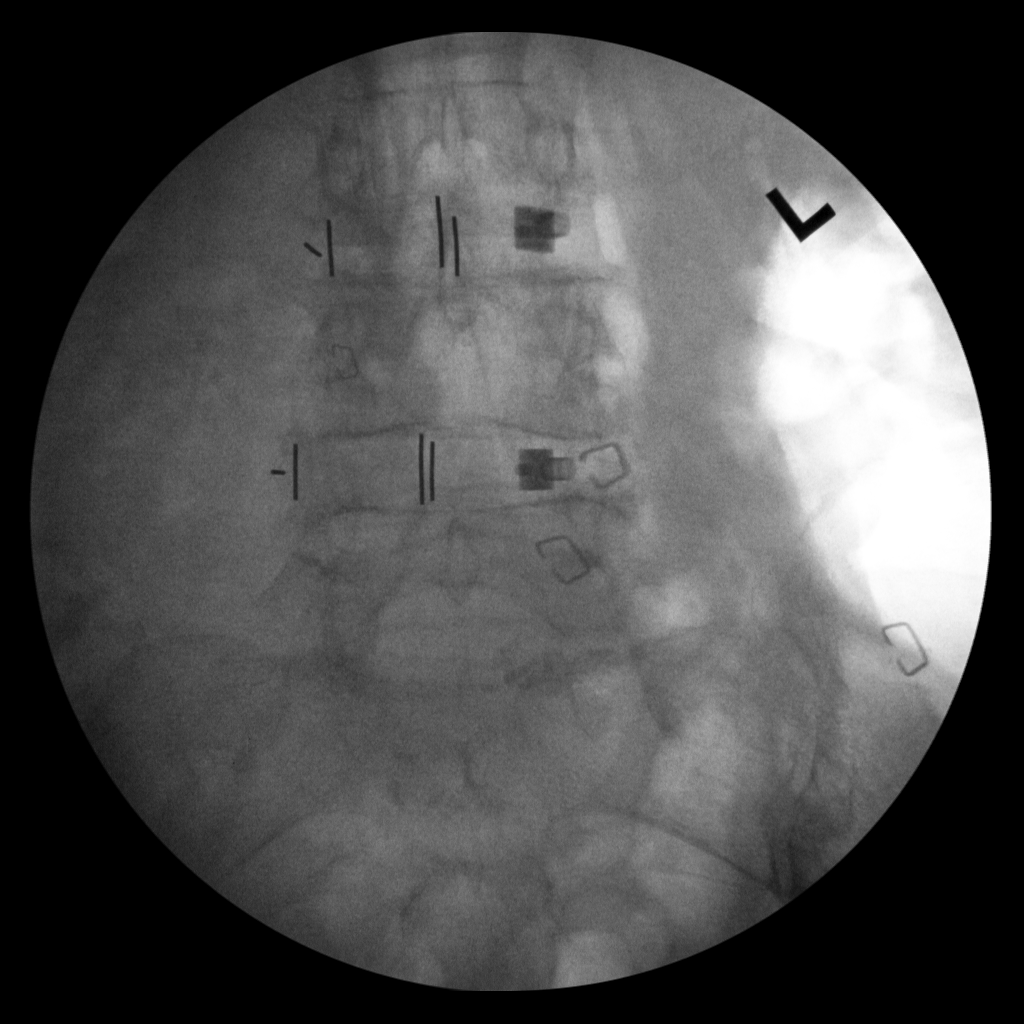

[2 of 2 positions shown; findings below may reference images not displayed]

FINDINGS: Two intraoperative fluoroscopic images were obtained of the lower
lumbar spine. These demonstrate the patient be status post interbody
fusion of L3-4 and L4-5.
IMPRESSION: Fluoroscopic guidance provided during lumbar fusion.

## 2020-05-11 SURGERY — OBLIQUE LUMBAR INTERBODY FUSION 1 LEVEL
Anesthesia: General | Site: Spine Lumbar

## 2020-05-11 MED ORDER — MENTHOL 3 MG MT LOZG: 1.0000 | LOZENGE | OROMUCOSAL | Status: DC | PRN

## 2020-05-11 MED ORDER — ONDANSETRON HCL 4 MG PO TABS: 4.0000 mg | ORAL_TABLET | Freq: Four times a day (QID) | ORAL | Status: DC | PRN

## 2020-05-11 MED ORDER — PROPOFOL 10 MG/ML IV BOLUS
INTRAVENOUS | Status: DC | PRN
  Administered 2020-05-11: 150 mg via INTRAVENOUS
  Administered 2020-05-11: 50 mg via INTRAVENOUS

## 2020-05-11 MED ORDER — FENTANYL CITRATE (PF) 250 MCG/5ML IJ SOLN
INTRAMUSCULAR | Status: DC | PRN
  Administered 2020-05-11 (×5): 50 ug via INTRAVENOUS

## 2020-05-11 MED ORDER — SODIUM CHLORIDE 0.9% FLUSH: 3.0000 mL | INTRAVENOUS | Status: DC | PRN

## 2020-05-11 MED ORDER — HYDROCHLOROTHIAZIDE 25 MG PO TABS
12.5000 mg | ORAL_TABLET | Freq: Every day | ORAL | Status: DC
  Administered 2020-05-11 – 2020-05-13 (×2): 12.5 mg via ORAL
  Filled 2020-05-11 (×2): qty 1

## 2020-05-11 MED ORDER — METHOCARBAMOL 1000 MG/10ML IJ SOLN
500.0000 mg | Freq: Four times a day (QID) | INTRAVENOUS | Status: DC | PRN
  Filled 2020-05-11 (×3): qty 5

## 2020-05-11 MED ORDER — EPINEPHRINE PF 1 MG/ML IJ SOLN
INTRAMUSCULAR | Status: AC
  Filled 2020-05-11: qty 1

## 2020-05-11 MED ORDER — SUCCINYLCHOLINE CHLORIDE 200 MG/10ML IV SOSY
PREFILLED_SYRINGE | INTRAVENOUS | Status: AC
  Filled 2020-05-11: qty 10

## 2020-05-11 MED ORDER — OXYCODONE HCL 5 MG PO TABS
10.0000 mg | ORAL_TABLET | ORAL | Status: DC | PRN
  Administered 2020-05-12 (×2): 10 mg via ORAL
  Filled 2020-05-11 (×2): qty 2

## 2020-05-11 MED ORDER — PROPOFOL 1000 MG/100ML IV EMUL
INTRAVENOUS | Status: AC
  Filled 2020-05-11: qty 100

## 2020-05-11 MED ORDER — BUPIVACAINE LIPOSOME 1.3 % IJ SUSP
INTRAMUSCULAR | Status: DC | PRN
  Administered 2020-05-11 (×2): 10 mL

## 2020-05-11 MED ORDER — ACETAMINOPHEN 650 MG RE SUPP: 650.0000 mg | RECTAL | Status: DC | PRN

## 2020-05-11 MED ORDER — MORPHINE SULFATE (PF) 2 MG/ML IV SOLN: 1.0000 mg | INTRAVENOUS | Status: DC | PRN

## 2020-05-11 MED ORDER — ONDANSETRON HCL 4 MG/2ML IJ SOLN
INTRAMUSCULAR | Status: AC
  Filled 2020-05-11: qty 2

## 2020-05-11 MED ORDER — BUPIVACAINE HCL (PF) 0.25 % IJ SOLN
INTRAMUSCULAR | Status: DC | PRN
  Administered 2020-05-11 (×2): 15 mL via EPIDURAL

## 2020-05-11 MED ORDER — PROPOFOL 10 MG/ML IV BOLUS
INTRAVENOUS | Status: AC
  Filled 2020-05-11: qty 20

## 2020-05-11 MED ORDER — SUCCINYLCHOLINE CHLORIDE 200 MG/10ML IV SOSY
PREFILLED_SYRINGE | INTRAVENOUS | Status: DC | PRN
  Administered 2020-05-11: 140 mg via INTRAVENOUS

## 2020-05-11 MED ORDER — METOPROLOL SUCCINATE ER 25 MG PO TB24
25.0000 mg | ORAL_TABLET | Freq: Two times a day (BID) | ORAL | Status: DC
  Administered 2020-05-11 – 2020-05-13 (×5): 25 mg via ORAL
  Filled 2020-05-11 (×5): qty 1

## 2020-05-11 MED ORDER — ACETAMINOPHEN 500 MG PO TABS
1000.0000 mg | ORAL_TABLET | Freq: Once | ORAL | Status: AC
  Administered 2020-05-11: 1000 mg via ORAL
  Filled 2020-05-11: qty 2

## 2020-05-11 MED ORDER — PRIMIDONE 50 MG PO TABS
100.0000 mg | ORAL_TABLET | Freq: Every day | ORAL | Status: DC
  Administered 2020-05-11 – 2020-05-13 (×3): 100 mg via ORAL
  Filled 2020-05-11 (×3): qty 2

## 2020-05-11 MED ORDER — FENTANYL CITRATE (PF) 100 MCG/2ML IJ SOLN
INTRAMUSCULAR | Status: AC
  Filled 2020-05-11: qty 2

## 2020-05-11 MED ORDER — THROMBIN (RECOMBINANT) 20000 UNITS EX SOLR
CUTANEOUS | Status: AC
  Filled 2020-05-11: qty 20000

## 2020-05-11 MED ORDER — ROCURONIUM BROMIDE 10 MG/ML (PF) SYRINGE
PREFILLED_SYRINGE | INTRAVENOUS | Status: AC
  Filled 2020-05-11: qty 10

## 2020-05-11 MED ORDER — ACETAMINOPHEN 325 MG PO TABS: 650.0000 mg | ORAL_TABLET | ORAL | Status: DC | PRN

## 2020-05-11 MED ORDER — FENTANYL CITRATE (PF) 250 MCG/5ML IJ SOLN
INTRAMUSCULAR | Status: AC
  Filled 2020-05-11: qty 5

## 2020-05-11 MED ORDER — BUPIVACAINE HCL (PF) 0.25 % IJ SOLN
INTRAMUSCULAR | Status: AC
  Filled 2020-05-11: qty 30

## 2020-05-11 MED ORDER — CHLORHEXIDINE GLUCONATE CLOTH 2 % EX PADS: 6.0000 | MEDICATED_PAD | Freq: Every day | CUTANEOUS | Status: DC

## 2020-05-11 MED ORDER — MIDAZOLAM HCL 2 MG/2ML IJ SOLN
INTRAMUSCULAR | Status: DC | PRN
  Administered 2020-05-11 (×2): 1 mg via INTRAVENOUS

## 2020-05-11 MED ORDER — NITROGLYCERIN 0.4 MG SL SUBL: 0.4000 mg | SUBLINGUAL_TABLET | SUBLINGUAL | Status: DC | PRN

## 2020-05-11 MED ORDER — CHLORHEXIDINE GLUCONATE 4 % EX LIQD: 60.0000 mL | Freq: Once | CUTANEOUS | Status: DC

## 2020-05-11 MED ORDER — PHENOL 1.4 % MT LIQD: 1.0000 | OROMUCOSAL | Status: DC | PRN

## 2020-05-11 MED ORDER — LACTATED RINGERS IV SOLN: INTRAVENOUS | Status: DC

## 2020-05-11 MED ORDER — DEXAMETHASONE SODIUM PHOSPHATE 10 MG/ML IJ SOLN
INTRAMUSCULAR | Status: DC | PRN
  Administered 2020-05-11: 10 mg via INTRAVENOUS

## 2020-05-11 MED ORDER — CHLORHEXIDINE GLUCONATE 0.12 % MT SOLN
15.0000 mL | Freq: Once | OROMUCOSAL | Status: AC
  Administered 2020-05-11: 15 mL via OROMUCOSAL
  Filled 2020-05-11: qty 15

## 2020-05-11 MED ORDER — CELECOXIB 200 MG PO CAPS
200.0000 mg | ORAL_CAPSULE | Freq: Once | ORAL | Status: AC
  Administered 2020-05-11: 200 mg via ORAL
  Filled 2020-05-11: qty 1

## 2020-05-11 MED ORDER — VANCOMYCIN HCL IN DEXTROSE 1-5 GM/200ML-% IV SOLN
1000.0000 mg | INTRAVENOUS | Status: DC
  Filled 2020-05-11: qty 200

## 2020-05-11 MED ORDER — ISOSORBIDE MONONITRATE ER 30 MG PO TB24
30.0000 mg | ORAL_TABLET | Freq: Every day | ORAL | Status: DC
  Administered 2020-05-12 – 2020-05-13 (×2): 30 mg via ORAL
  Filled 2020-05-11 (×4): qty 1

## 2020-05-11 MED ORDER — PHENYLEPHRINE HCL-NACL 10-0.9 MG/250ML-% IV SOLN
INTRAVENOUS | Status: DC | PRN
  Administered 2020-05-11: 25 ug/min via INTRAVENOUS

## 2020-05-11 MED ORDER — 0.9 % SODIUM CHLORIDE (POUR BTL) OPTIME
TOPICAL | Status: DC | PRN
  Administered 2020-05-11 (×2): 1000 mL

## 2020-05-11 MED ORDER — LIDOCAINE 2% (20 MG/ML) 5 ML SYRINGE
INTRAMUSCULAR | Status: AC
  Filled 2020-05-11: qty 5

## 2020-05-11 MED ORDER — ONDANSETRON HCL 4 MG/2ML IJ SOLN
INTRAMUSCULAR | Status: DC | PRN
  Administered 2020-05-11: 4 mg via INTRAVENOUS

## 2020-05-11 MED ORDER — FENTANYL CITRATE (PF) 100 MCG/2ML IJ SOLN
25.0000 ug | INTRAMUSCULAR | Status: DC | PRN
  Administered 2020-05-11 (×2): 50 ug via INTRAVENOUS

## 2020-05-11 MED ORDER — MIDAZOLAM HCL 2 MG/2ML IJ SOLN
INTRAMUSCULAR | Status: AC
  Filled 2020-05-11: qty 2

## 2020-05-11 MED ORDER — DOCUSATE SODIUM 100 MG PO CAPS
100.0000 mg | ORAL_CAPSULE | Freq: Two times a day (BID) | ORAL | Status: DC
  Administered 2020-05-11 – 2020-05-13 (×4): 100 mg via ORAL
  Filled 2020-05-11 (×4): qty 1

## 2020-05-11 MED ORDER — METHOCARBAMOL 500 MG PO TABS
500.0000 mg | ORAL_TABLET | Freq: Four times a day (QID) | ORAL | Status: DC | PRN
  Administered 2020-05-11 – 2020-05-12 (×2): 500 mg via ORAL
  Filled 2020-05-11 (×2): qty 1

## 2020-05-11 MED ORDER — PROPOFOL 500 MG/50ML IV EMUL
INTRAVENOUS | Status: DC | PRN
  Administered 2020-05-11: 25 ug/kg/min via INTRAVENOUS

## 2020-05-11 MED ORDER — VANCOMYCIN HCL IN DEXTROSE 1-5 GM/200ML-% IV SOLN
1000.0000 mg | Freq: Once | INTRAVENOUS | Status: AC
  Administered 2020-05-11: 1000 mg via INTRAVENOUS
  Filled 2020-05-11: qty 200

## 2020-05-11 MED ORDER — LISINOPRIL 10 MG PO TABS
10.0000 mg | ORAL_TABLET | Freq: Every day | ORAL | Status: DC
  Administered 2020-05-11 – 2020-05-13 (×2): 10 mg via ORAL
  Filled 2020-05-11 (×2): qty 1

## 2020-05-11 MED ORDER — HEMOSTATIC AGENTS (NO CHARGE) OPTIME
TOPICAL | Status: DC | PRN
  Administered 2020-05-11 (×2): 1

## 2020-05-11 MED ORDER — SODIUM CHLORIDE 0.9 % IV SOLN: 250.0000 mL | INTRAVENOUS | Status: DC

## 2020-05-11 MED ORDER — DEXAMETHASONE SODIUM PHOSPHATE 10 MG/ML IJ SOLN
INTRAMUSCULAR | Status: AC
  Filled 2020-05-11: qty 1

## 2020-05-11 MED ORDER — ALBUMIN HUMAN 5 % IV SOLN: INTRAVENOUS | Status: DC | PRN

## 2020-05-11 MED ORDER — ORAL CARE MOUTH RINSE: 15.0000 mL | Freq: Once | OROMUCOSAL | Status: AC

## 2020-05-11 MED ORDER — GABAPENTIN 300 MG PO CAPS
300.0000 mg | ORAL_CAPSULE | Freq: Three times a day (TID) | ORAL | Status: DC
  Administered 2020-05-11 – 2020-05-13 (×8): 300 mg via ORAL
  Filled 2020-05-11 (×8): qty 1

## 2020-05-11 MED ORDER — ONDANSETRON HCL 4 MG/2ML IJ SOLN: 4.0000 mg | Freq: Four times a day (QID) | INTRAMUSCULAR | Status: DC | PRN

## 2020-05-11 MED ORDER — POLYETHYLENE GLYCOL 3350 17 G PO PACK: 17.0000 g | PACK | Freq: Every day | ORAL | Status: DC | PRN

## 2020-05-11 MED ORDER — VANCOMYCIN HCL 1000 MG IV SOLR
INTRAVENOUS | Status: DC | PRN
  Administered 2020-05-11: 1000 mg via INTRAVENOUS

## 2020-05-11 MED ORDER — OXYCODONE HCL 5 MG PO TABS
5.0000 mg | ORAL_TABLET | ORAL | Status: DC | PRN
  Administered 2020-05-11 (×2): 5 mg via ORAL
  Filled 2020-05-11 (×2): qty 1

## 2020-05-11 MED ORDER — PANTOPRAZOLE SODIUM 40 MG PO TBEC
40.0000 mg | DELAYED_RELEASE_TABLET | Freq: Two times a day (BID) | ORAL | Status: DC
  Administered 2020-05-11 – 2020-05-13 (×5): 40 mg via ORAL
  Filled 2020-05-11 (×5): qty 1

## 2020-05-11 MED ORDER — PROMETHAZINE HCL 25 MG/ML IJ SOLN: 6.2500 mg | INTRAMUSCULAR | Status: DC | PRN

## 2020-05-11 MED ORDER — SODIUM CHLORIDE 0.9% FLUSH: 3.0000 mL | Freq: Two times a day (BID) | INTRAVENOUS | Status: DC

## 2020-05-11 SURGICAL SUPPLY — 76 items
APPLIER CLIP 11 MED OPEN (CLIP) ×4
BLADE CLIPPER SURG (BLADE) IMPLANT
BLADE SURG 10 STRL SS (BLADE) ×4 IMPLANT
BONE VIVIGEN FORMABLE 10CC (Bone Implant) ×4 IMPLANT
BONE VIVIGEN FORMABLE 5.4CC (Bone Implant) ×4 IMPLANT
CATH FOLEY 2WAY SLVR 30CC 16FR (CATHETERS) ×4 IMPLANT
CLIP APPLIE 11 MED OPEN (CLIP) ×2 IMPLANT
CLIP LIGATING EXTRA MED SLVR (CLIP) ×4 IMPLANT
CLIP LIGATING EXTRA SM BLUE (MISCELLANEOUS) ×4 IMPLANT
CLOSURE STERI-STRIP 1/2X4 (GAUZE/BANDAGES/DRESSINGS) ×1
CLSR STERI-STRIP ANTIMIC 1/2X4 (GAUZE/BANDAGES/DRESSINGS) ×3 IMPLANT
COVER SURGICAL LIGHT HANDLE (MISCELLANEOUS) ×4 IMPLANT
COVER WAND RF STERILE (DRAPES) ×8 IMPLANT
DERMABOND ADVANCED (GAUZE/BANDAGES/DRESSINGS)
DERMABOND ADVANCED .7 DNX12 (GAUZE/BANDAGES/DRESSINGS) IMPLANT
DRAPE C-ARM 42X72 X-RAY (DRAPES) ×4 IMPLANT
DRAPE C-ARMOR (DRAPES) ×4 IMPLANT
DRAPE INCISE IOBAN 66X45 STRL (DRAPES) IMPLANT
DRAPE U-SHAPE 47X51 STRL (DRAPES) ×8 IMPLANT
DRSG OPSITE POSTOP 4X6 (GAUZE/BANDAGES/DRESSINGS) ×4 IMPLANT
DRSG OPSITE POSTOP 4X8 (GAUZE/BANDAGES/DRESSINGS) ×4 IMPLANT
DURAPREP 26ML APPLICATOR (WOUND CARE) ×8 IMPLANT
ELECT BLADE 4.0 EZ CLEAN MEGAD (MISCELLANEOUS) ×8
ELECT PENCIL ROCKER SW 15FT (MISCELLANEOUS) ×4 IMPLANT
ELECT REM PT RETURN 9FT ADLT (ELECTROSURGICAL) ×4
ELECTRODE BLDE 4.0 EZ CLN MEGD (MISCELLANEOUS) ×4 IMPLANT
ELECTRODE REM PT RTRN 9FT ADLT (ELECTROSURGICAL) ×2 IMPLANT
GLOVE BIO SURGEON STRL SZ 6.5 (GLOVE) ×3 IMPLANT
GLOVE BIO SURGEONS STRL SZ 6.5 (GLOVE) ×1
GLOVE BIOGEL PI IND STRL 6.5 (GLOVE) ×2 IMPLANT
GLOVE BIOGEL PI IND STRL 8.5 (GLOVE) ×2 IMPLANT
GLOVE BIOGEL PI INDICATOR 6.5 (GLOVE) ×2
GLOVE BIOGEL PI INDICATOR 8.5 (GLOVE) ×2
GLOVE SS BIOGEL STRL SZ 7.5 (GLOVE) ×2 IMPLANT
GLOVE SS BIOGEL STRL SZ 8.5 (GLOVE) ×2 IMPLANT
GLOVE SUPERSENSE BIOGEL SZ 7.5 (GLOVE) ×2
GLOVE SUPERSENSE BIOGEL SZ 8.5 (GLOVE) ×2
GOWN STRL REUS W/ TWL LRG LVL3 (GOWN DISPOSABLE) ×4 IMPLANT
GOWN STRL REUS W/ TWL XL LVL3 (GOWN DISPOSABLE) ×2 IMPLANT
GOWN STRL REUS W/TWL 2XL LVL3 (GOWN DISPOSABLE) ×8 IMPLANT
GOWN STRL REUS W/TWL LRG LVL3 (GOWN DISPOSABLE) ×4
GOWN STRL REUS W/TWL XL LVL3 (GOWN DISPOSABLE) ×2
HEMOSTAT SURGICEL 2X14 (HEMOSTASIS) ×4 IMPLANT
KIT BASIN OR (CUSTOM PROCEDURE TRAY) ×4 IMPLANT
KIT TURNOVER KIT B (KITS) ×4 IMPLANT
MODULE EMG NEEDLE SSEP NVM5 (NEEDLE) ×4 IMPLANT
MODULE NVM5 NEXT GEN EMG (NEEDLE) ×4 IMPLANT
NEEDLE 22X1 1/2 (OR ONLY) (NEEDLE) ×4 IMPLANT
NEEDLE SPNL 18GX3.5 QUINCKE PK (NEEDLE) ×4 IMPLANT
NS IRRIG 1000ML POUR BTL (IV SOLUTION) ×8 IMPLANT
PACK LAMINECTOMY ORTHO (CUSTOM PROCEDURE TRAY) ×4 IMPLANT
PACK UNIVERSAL I (CUSTOM PROCEDURE TRAY) ×4 IMPLANT
PAD ARMBOARD 7.5X6 YLW CONV (MISCELLANEOUS) ×16 IMPLANT
SPACER OLIF 12X50 6D (Spacer) ×4 IMPLANT
SPACER OLIF 12X55 6D (Spacer) ×4 IMPLANT
SPONGE INTESTINAL PEANUT (DISPOSABLE) ×4 IMPLANT
SPONGE LAP 18X18 RF (DISPOSABLE) IMPLANT
SPONGE LAP 4X18 RFD (DISPOSABLE) ×4 IMPLANT
SPONGE SURGIFOAM ABS GEL 100 (HEMOSTASIS) IMPLANT
STAPLER VISISTAT 35W (STAPLE) ×4 IMPLANT
SURGIFLO W/THROMBIN 8M KIT (HEMOSTASIS) ×4 IMPLANT
SUT BONE WAX W31G (SUTURE) IMPLANT
SUT MNCRL AB 3-0 PS2 27 (SUTURE) ×8 IMPLANT
SUT PDS AB 1 CT  36 (SUTURE) ×4
SUT PDS AB 1 CT 36 (SUTURE) ×4 IMPLANT
SUT SILK 2 0 TIES 10X30 (SUTURE) ×4 IMPLANT
SUT VIC AB 0 CT1 27 (SUTURE) ×2
SUT VIC AB 0 CT1 27XBRD ANBCTR (SUTURE) ×2 IMPLANT
SUT VIC AB 2-0 CT1 18 (SUTURE) ×12 IMPLANT
SYR BULB IRRIG 60ML STRL (SYRINGE) ×4 IMPLANT
TAPE CLOTH 4X10 WHT NS (GAUZE/BANDAGES/DRESSINGS) ×4 IMPLANT
TOWEL GREEN STERILE (TOWEL DISPOSABLE) ×8 IMPLANT
TOWEL GREEN STERILE FF (TOWEL DISPOSABLE) ×4 IMPLANT
TRAY FOLEY W/BAG SLVR 16FR (SET/KITS/TRAYS/PACK) ×2
TRAY FOLEY W/BAG SLVR 16FR ST (SET/KITS/TRAYS/PACK) ×2 IMPLANT
WATER STERILE IRR 1000ML POUR (IV SOLUTION) IMPLANT

## 2020-05-11 NOTE — Progress Notes (Signed)
Pharmacy Antibiotic Note  [name] is a 74 y.o. female admitted on 05/11/2020 with degenerative disc disease; pt it S/P lumbar disc fusion this afternoon.  Pharmacy has been consulted for vancomycin dosing for post-operative surgical prophylaxis.  Pt rec'd vancomycin 1 gm IV X 1 at 0810 AM this AM pre op; Scr 1.20, CrCl ~41 ml/min  Plan: Vancomycin 1 gm IV X 1 ~12 hrs after pre-op dose (scheduled for 2000 this evening)  Temp (24hrs), Avg:98 F (36.7 C), Min:97 F (36.1 C), Max:98.5 F (36.9 C)  Estimated Creatinine Clearance: 40.6 mL/min (A) (by C-G formula based on SCr of 1.2 mg/dL (H)).    Allergies  Allergen Reactions  . Cephalexin Nausea Only    Microbiology results: 11/3 MRSA PCR: negative 11/8 COVID: negative  Thank you for allowing pharmacy to be a part of this patient's care.  Gillermina Hu, PharmD, BCPS, [facility] Clinical Pharmacist 05/11/2020 3:56 PM

## 2020-05-11 NOTE — Op Note (Addendum)
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  Complications: None  Approach surgeon: Dr. Curt Jews  First Assistant: Cleta Alberts, PA  EBL: 50 cc  Cell Saver: No return  Graft: vivogen  Implants: Medtronic peek intervertebral cage:  Pivox.  12 x 50 x 6 degree lordosis at L3-4.  12 x 55 x 6 degree lordosis at L4-5.  Indications: This is a very pleasant 74 year old woman a previous L4-5 decompression and has gone on to have ongoing recurrent radicular leg and a degenerative spondylolisthesis. There is also significant foraminal stenosis at L3-4.  Attempts at conservative management had failed to alleviate her pain and improve her quality of life.  As a result we elected to move forward with surgery.  Surgical plan was a an interbody fusion at L3-4 and L4-5 as a stage I procedure, followed by a posterior supplemental pedicle screw fixation L3-5 with possible decompression if required on day 2.  Operative report patient was brought the operating room placed prone on the operating room table.  After successful induction general anesthesia and endotracheal intubation, teds and SCDs were applied as well as a Foley inserted.  Patient was turned in the right lateral decubitus position with the left side up.  Axillary roll was placed and all bony prominences well-padded and the left arm was placed into the arm holder.  At this point using fluoroscopy I confirmed that we had satisfactory position and visualization of the L4-5 and L3-4 levels.  The patient was then secured directly to the table with tape to prevent movement during surgery.  The neuro monitoring representative then applied all appropriate intraoperative neuro monitoring devices for SSEP and free running EMG activity.   Timeout was taken to confirm patient procedure and all other important data.  The flank was then prepped and draped in a standard fashion and Dr. Donnetta Hutching then scrubbed in to perform a standard oblique retroperitoneal approach to the lumbar spine.  Please refer to his dictation for specifics.  Once he had the retractors in place we confirmed the L4-5 level with fluoroscopy.  Once confirmed I marked this with the Bovie and then proceeded with the discectomy.  It should be noted that during the approach Dr. Donnetta Hutching was able to visualize the L3-4 level as well.  Was at this point we elected to do an oblique lumbar interbody fusion at L3-4 since that level was also exposed.  This would prevent the need for separate incision for the lateral approach to the disc.  An annulotomy was performed at L4-5 and using pituitary rongeurs, box osteotomes, and curettes I removed all the disc material.  I then used a Cobb elevator to release the contralateral annulus.  Once this was done I then used the rasp osteotome to ensure I had removed all the cartilaginous endplate and expose bleeding subchondral bone.  I then used the trial spacers and elected to use the size 12 x 55.  This provided the best overall fit in the disc space and restoration of disc space height and reduction of the spondylolisthesis.  The peek cage was then obtained and packed with the allograft.  I then advanced this using fluoroscopic guidance until it reached the contralateral side.  I then packed additional bone graft in the space between the cage and the anterior annulus.  AP and lateral fluoroscopy views demonstrated satisfactory position of  this cage.  At this point I repositioned the retractor blades to completely expose the anterior and lateral aspect of the L3-4 disc space.  Since I had excellent visualization I elected not to perform a separate incision lateral interbody fusion.  An annulotomy was created L3-4 and I used a Cobb curette to release  the disc material from the endplates of L3 and L4 and bisect the contralateral annulus.  I then used a box osteotomes to remove the bulk of the disc material.  Using curettes and pituitary rongeurs I remove the remaining portion of disc material.  Just as was done at L4-5 I used a small angled curette to release posteriorly.  At this point I again trialed with intervertebral spacers and elected use the size 12 x 50 mm implant.  The peek intervertebral cage was obtained and packed with allograft and then malleted to the appropriate depth.  Prior to inserting the cage I did rasp the endplates and confirm that I had removed all the cartilaginous endplate and had bleeding subchondral bone.  X-rays at this time demonstrated satisfactory position of both cages in the AP and lateral planes.  The wound at this point was copiously irrigated with normal saline and I made sure that hemostasis.  Once I confirmed hemostasis I removed all of the retractor blades and began closing the wound.  The fascia of the external oblique was closed with a running #1 PDS suture.  We then closed in a layered fashion with a running 0 Vicryl suture, 2-0 Vicryl suture, and a 3-0 Monocryl.  Prior to final skin closure we did take it AP x-ray per the protocol to confirm there is no retained surgical instruments in the wound.  This was read by the radiologist.  After we had the radiologist clear the film final dressings were applied and the patient was ultimately extubated transferred to PACU without incident.  The end of the case all needle sponge counts were correct.  There were no adverse intraoperative events.

## 2020-05-11 NOTE — Anesthesia Procedure Notes (Signed)
Procedure Name: Intubation Date/Time: 05/11/2020 8:35 AM Performed by: Michele Rockers, CRNA Pre-anesthesia Checklist: Patient identified, Patient being monitored, Timeout performed, Emergency Drugs available and Suction available Patient Re-evaluated:Patient Re-evaluated prior to induction Oxygen Delivery Method: Circle System Utilized Preoxygenation: Pre-oxygenation with 100% oxygen Induction Type: IV induction Ventilation: Mask ventilation without difficulty Laryngoscope Size: Miller and 2 Grade View: Grade I Tube type: Oral Tube size: 7.5 mm Number of attempts: 1 Airway Equipment and Method: Stylet Placement Confirmation: ETT inserted through vocal cords under direct vision,  positive ETCO2 and breath sounds checked- equal and bilateral Secured at: 21 cm Tube secured with: Tape Dental Injury: Teeth and Oropharynx as per pre-operative assessment

## 2020-05-11 NOTE — H&P (Signed)
Addendum H&P  Patient continues to have severe back buttock and bilateral neuropathic leg pain.  Imaging studies were reviewed and she has lumbar spinal stenosis with moderate/severe to severe foraminal stenosis L3-4 and L4-5.  This is producing neurogenic claudication (radicular leg pain).  As result of the failure of conservative management and the ongoing deterioration in her quality of life we have elected to move forward with surgery.  All appropriate risks benefits and alternatives were discussed with the patient and consent was obtained.  There has been no change in her clinical exam since her last office visit of 05/03/2020.

## 2020-05-11 NOTE — OR Nursing (Signed)
Per protocol Dr.Wagoner called at 11:39 A.M. on 05/11/2020 and confirmed there were no foreign retained objects in image.

## 2020-05-11 NOTE — Brief Op Note (Signed)
05/11/2020  11:47 AM  PATIENT:  [name]  74 y.o. female  PRE-OPERATIVE DIAGNOSIS:  Post laminectomy syndrome with recurrent stenosis and radiculopathy  POST-OPERATIVE DIAGNOSIS:  Post laminectomy syndrome with recurrent stenosis and radiculopathy  PROCEDURE:  Procedure(s) with comments: OBLIQUE LUMBAR INTERBODY FUSION (OLIF) LUMBAR THREE-FOUR, LUMBAR FOUR-FIVE (N/A) - 4 hrs Dr. Donnetta Hutching to do approach Tap block with exparel ABDOMINAL EXPOSURE (N/A)  SURGEON:  Surgeon(s) and Role: Panel 1:    Melina Schools, MD - Primary Panel 2:    * Early, Arvilla Meres, MD - Primary  PHYSICIAN ASSISTANT:   ASSISTANTS: Amanda Ward, PA   ANESTHESIA:   general  EBL:  100 mL   BLOOD ADMINISTERED:none  DRAINS: none   LOCAL MEDICATIONS USED:  MARCAINE     SPECIMEN:  No Specimen  DISPOSITION OF SPECIMEN:  N/A  COUNTS:  YES  TOURNIQUET:  * No tourniquets in log *  DICTATION: .Dragon Dictation  PLAN OF CARE: Admit to inpatient   PATIENT DISPOSITION:  PACU - hemodynamically stable.

## 2020-05-11 NOTE — Anesthesia Procedure Notes (Signed)
Anesthesia Regional Block: TAP block   Pre-Anesthetic Checklist: ,, timeout performed, Correct Patient, Correct Site, Correct Laterality, Correct Procedure, Correct Position, site marked, Risks and benefits discussed,  Surgical consent,  Pre-op evaluation,  At surgeon's request and post-op pain management  Laterality: Right  Prep: chloraprep       Needles:  Injection technique: Single-shot  Needle Type: Echogenic Stimulator Needle     Needle Length: 10cm  Needle Gauge: 21     Additional Needles:   Narrative:  Start time: 05/11/2020 7:56 AM End time: 05/11/2020 8:04 AM Injection made incrementally with aspirations every 5 mL.  Performed by: Personally

## 2020-05-11 NOTE — Anesthesia Procedure Notes (Signed)
Anesthesia Regional Block: TAP block   Pre-Anesthetic Checklist: ,, timeout performed, Correct Patient, Correct Site, Correct Laterality, Correct Procedure, Correct Position, site marked, Risks and benefits discussed,  Surgical consent,  Pre-op evaluation,  At surgeon's request and post-op pain management  Laterality: Left  Prep: chloraprep       Needles:  Injection technique: Single-shot  Needle Type: Echogenic Stimulator Needle     Needle Length: 10cm  Needle Gauge: 21     Additional Needles:   Narrative:  Start time: 05/11/2020 7:48 AM End time: 05/11/2020 7:56 AM Injection made incrementally with aspirations every 5 mL.  Performed by: Personally

## 2020-05-11 NOTE — Transfer of Care (Signed)
Immediate Anesthesia Transfer of Care Note  Patient: [name]  Procedure(s) Performed: OBLIQUE LUMBAR INTERBODY FUSION (OLIF) LUMBAR THREE-FOUR, LUMBAR FOUR-FIVE (N/A Spine Lumbar) ABDOMINAL EXPOSURE (N/A Abdomen)  Patient Location: PACU  Anesthesia Type:General  Level of Consciousness: drowsy, patient cooperative and responds to stimulation  Airway & Oxygen Therapy: Patient Spontanous Breathing, Patient connected to face mask oxygen and Patient connected to tracheostomy mask oxygen  Post-op Assessment: Report given to RN, Post -op Vital signs reviewed and stable and Patient moving all extremities X 4  Post vital signs: Reviewed and stable  Last Vitals:  Vitals Value Taken Time  BP    Temp    Pulse    Resp    SpO2      Last Pain:  Vitals:   05/11/20 0712  TempSrc:   PainSc: 0-No pain      Patients Stated Pain Goal: 5 (02/33/43 5686)  Complications: No complications documented.

## 2020-05-11 NOTE — Op Note (Signed)
    OPERATIVE REPORT  DATE OF SURGERY: 05/11/2020  PATIENT: [name], 74 y.o. female MRN: [id number]  DOB: Nov 26, 1945  PRE-OPERATIVE DIAGNOSIS: Degenerative disc disease  POST-OPERATIVE DIAGNOSIS:  Same  PROCEDURE: Oblique exposure for L3-4 and L4-5 disc fusion  Co-surgeon with Dr. Rolena Infante  SURGEON:  Curt Jews, M.D.  PHYSICIAN ASSISTANT: Cleta Alberts, PA-C  The assistant was needed for exposure and to expedite the case  ANESTHESIA: General  EBL: per anesthesia record  Total I/O In: 2050 [I.V.:1300; IV Piggyback:750] Out: 250 [Urine:150; Blood:100]  BLOOD ADMINISTERED: none  DRAINS: none  SPECIMEN: none  COUNTS CORRECT:  YES  PATIENT DISPOSITION:  PACU - hemodynamically stable  PROCEDURE DETAILS: The patient was taken operating placed supine position where the right side was placed laterally down in the left side up.  C-arm was brought into the field to determine the level of the L4-5 disc.  The usual prepping and draping was undertaken.  An oblique incision was made anterior to the iliac crest on the left and this was contained continued through the subcutaneous fat and anterior fascia.  The oblique muscles were opened bluntly with Metzenbaum scissors and the retroperitoneal space was entered.  Blunt dissection was continued down laterally to the level of the psoas muscle.  The psoas muscle was reflected to the left to get exposure of the disc.  Blunt dissection over the disc was used to give adequate exposure for interbody fusion.  The retractor was placed on the bed and this was position with the blades to give exposure to the disc.  The needle was placed in the disc and C-arm was brought back onto the field showing that this was the 3 4 level with good exposure.  Blunt dissection was continued to the next lower level and again blunt dissection was used reflect the left psoas more laterally left.  Patient did have a large lumbar vein crossing this level.  The vein was  ligated and divided.  The retractor system was used to expose the L4-5 disc.  A spinal needle was placed in this disc and C-arm was brought back onto the field to confirm that this was the L4-5 disc.  The remainder of the procedure with discectomy and fusion of L3-4 and L4-5 will be dictated as a separate note by Dr. Lynnea Ferrier, M.D., [facility] 05/11/2020 2:06 PM

## 2020-05-11 NOTE — Anesthesia Postprocedure Evaluation (Signed)
Anesthesia Post Note  Patient: [name]  Procedure(s) Performed: OBLIQUE LUMBAR INTERBODY FUSION (OLIF) LUMBAR THREE-FOUR, LUMBAR FOUR-FIVE (N/A Spine Lumbar) ABDOMINAL EXPOSURE (N/A Abdomen)     Patient location during evaluation: PACU Anesthesia Type: General Level of consciousness: sedated Pain management: pain level controlled Vital Signs Assessment: post-procedure vital signs reviewed and stable Respiratory status: spontaneous breathing and respiratory function stable Cardiovascular status: stable Postop Assessment: no apparent nausea or vomiting Anesthetic complications: no   No complications documented.  Last Vitals:  Vitals:   05/11/20 1420 05/11/20 1435  BP: 121/68 123/67  Pulse: 78 76  Resp: 10 10  Temp: 36.9 C   SpO2: 98% 93%    Last Pain:  Vitals:   05/11/20 1420  TempSrc:   PainSc: 0-No pain                 Dinita Migliaccio DANIEL

## 2020-05-12 ENCOUNTER — Inpatient Hospital Stay (HOSPITAL_COMMUNITY): Payer: Medicare Other

## 2020-05-12 ENCOUNTER — Encounter (HOSPITAL_COMMUNITY)
Admission: RE | Disposition: A | Discharge status: Home / Self Care | Payer: Self-pay | Source: Home / Self Care | Attending: Orthopedic Surgery

## 2020-05-12 ENCOUNTER — Inpatient Hospital Stay (HOSPITAL_COMMUNITY): Payer: Medicare Other | Admitting: Anesthesiology

## 2020-05-12 ENCOUNTER — Encounter (HOSPITAL_COMMUNITY): Payer: Self-pay | Admitting: Orthopedic Surgery

## 2020-05-12 ENCOUNTER — Inpatient Hospital Stay (HOSPITAL_COMMUNITY): Admission: RE | Admit: 2020-05-12 | Payer: Medicare Other | Source: Home / Self Care | Admitting: Orthopedic Surgery

## 2020-05-12 LAB — CBC
HCT: 29.8 % — ABNORMAL LOW (ref 36.0–46.0)
Hemoglobin: 10 g/dL — ABNORMAL LOW (ref 12.0–15.0)
MCH: 31.7 pg (ref 26.0–34.0)
MCHC: 33.6 g/dL (ref 30.0–36.0)
MCV: 94.6 fL (ref 80.0–100.0)
Platelets: 137 10*3/uL — ABNORMAL LOW (ref 150–400)
RBC: 3.15 MIL/uL — ABNORMAL LOW (ref 3.87–5.11)
RDW: 13.5 % (ref 11.5–15.5)
WBC: 10.7 10*3/uL — ABNORMAL HIGH (ref 4.0–10.5)
nRBC: 0 % (ref 0.0–0.2)

## 2020-05-12 LAB — PROTIME-INR
INR: 1.1 (ref 0.8–1.2)
Prothrombin Time: 14.2 seconds (ref 11.4–15.2)

## 2020-05-12 LAB — BASIC METABOLIC PANEL
Anion gap: 8 (ref 5–15)
BUN: 15 mg/dL (ref 8–23)
CO2: 26 mmol/L (ref 22–32)
Calcium: 8.6 mg/dL — ABNORMAL LOW (ref 8.9–10.3)
Chloride: 99 mmol/L (ref 98–111)
Creatinine, Ser: 0.97 mg/dL (ref 0.44–1.00)
GFR, Estimated: >60 mL/min (ref >60)
Glucose, Bld: 115 mg/dL — ABNORMAL HIGH (ref 70–99)
Potassium: 3.7 mmol/L (ref 3.5–5.1)
Sodium: 133 mmol/L — ABNORMAL LOW (ref 135–145)

## 2020-05-12 IMAGING — RF DG LUMBAR SPINE COMPLETE 4+V
1 series · 5 of 5 positions shown · non-contrast
Comparison: Lumbar spine radiographs [DATE].

CLINICAL DATA: Surgery, elective. Additional history provided: L3-5
pedicle screws. Provided fluoroscopy time 2 minutes, 49 seconds
(125.85 mGy).

EXAM:
LUMBAR SPINE - COMPLETE 4+ VIEW; DG C-ARM 1-60 MIN

[Series 1: run · 5 of 5 slices shown]
[im 1/5]
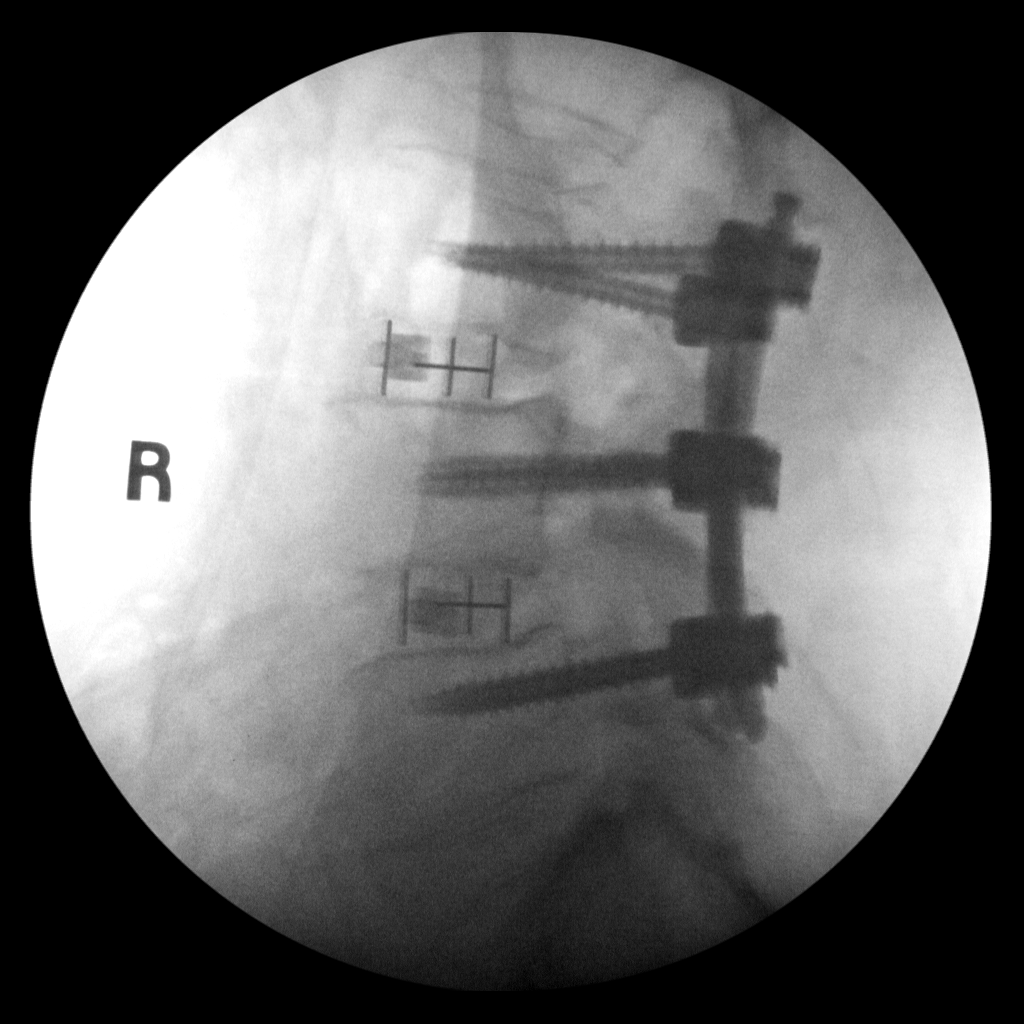
[im 2/5]
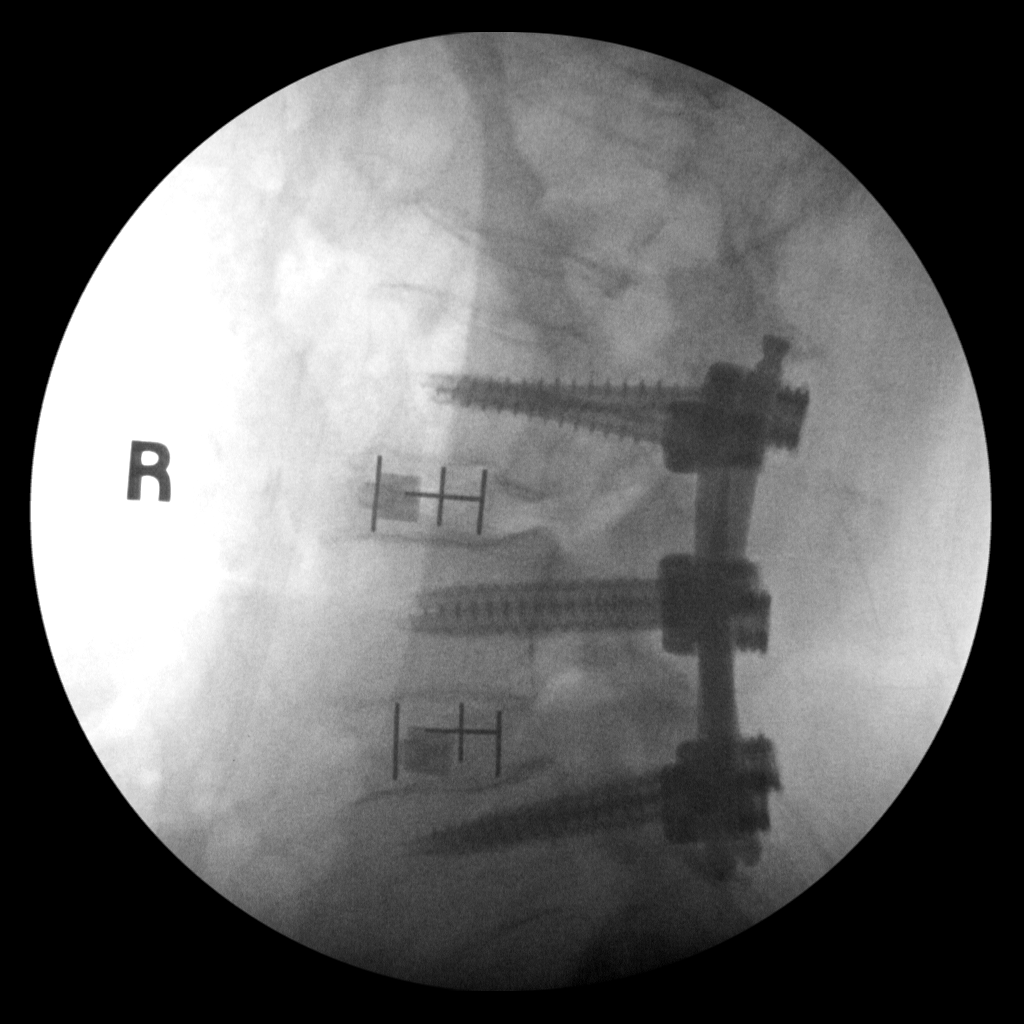
[im 3/5]
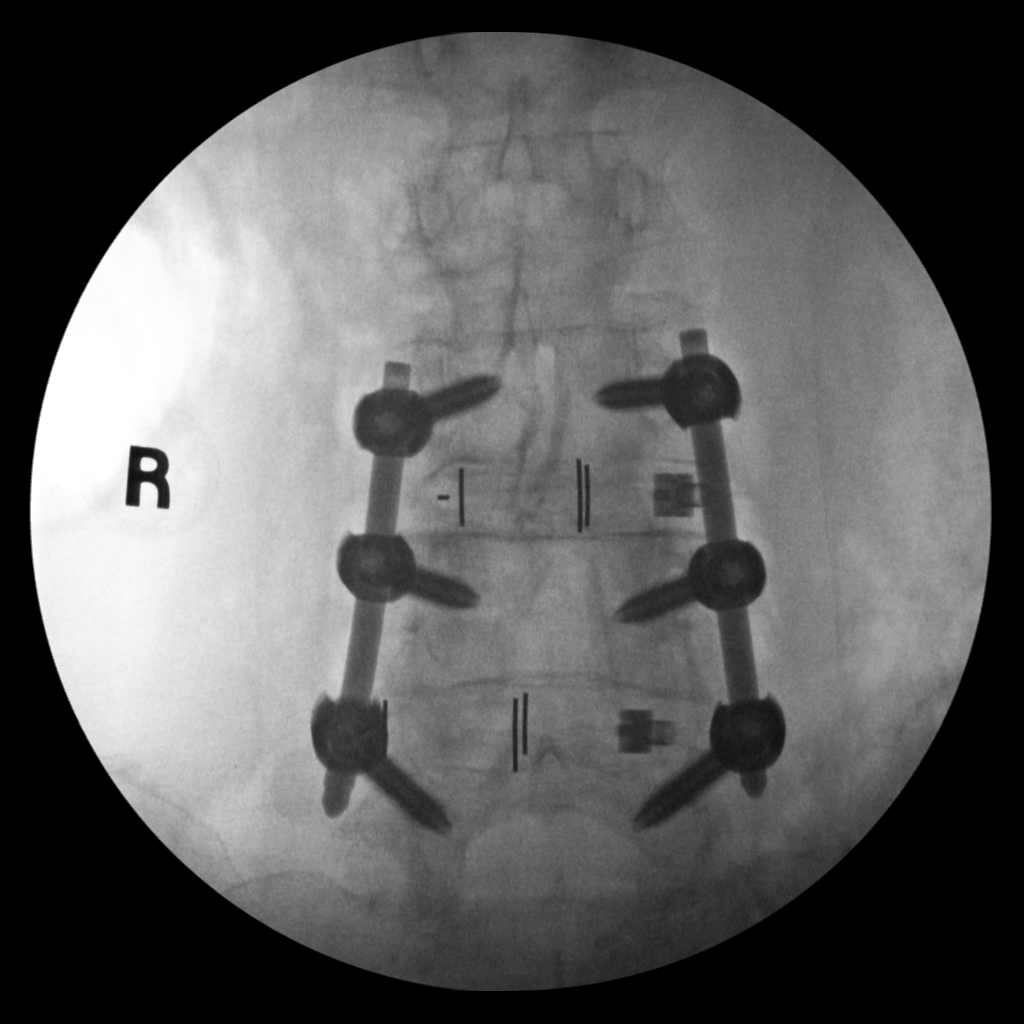
[im 4/5]
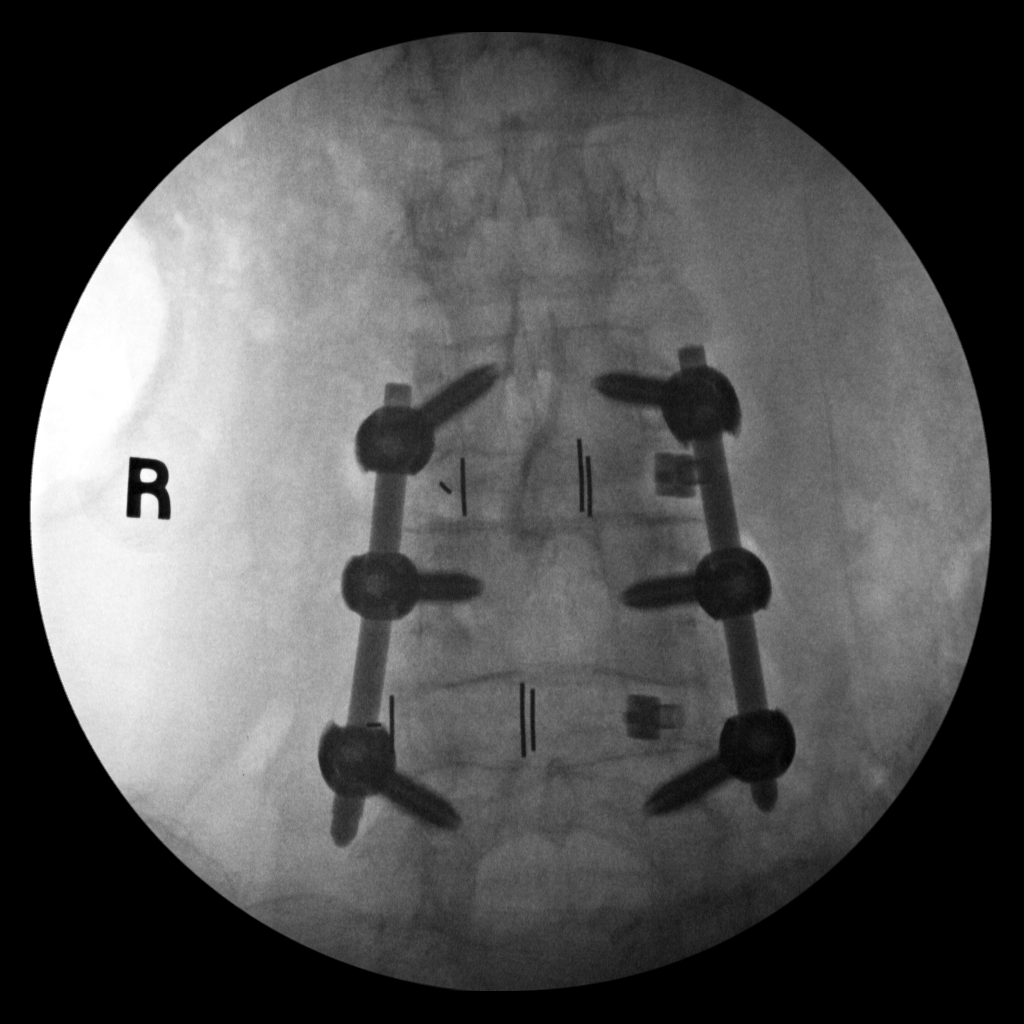
[im 5/5]
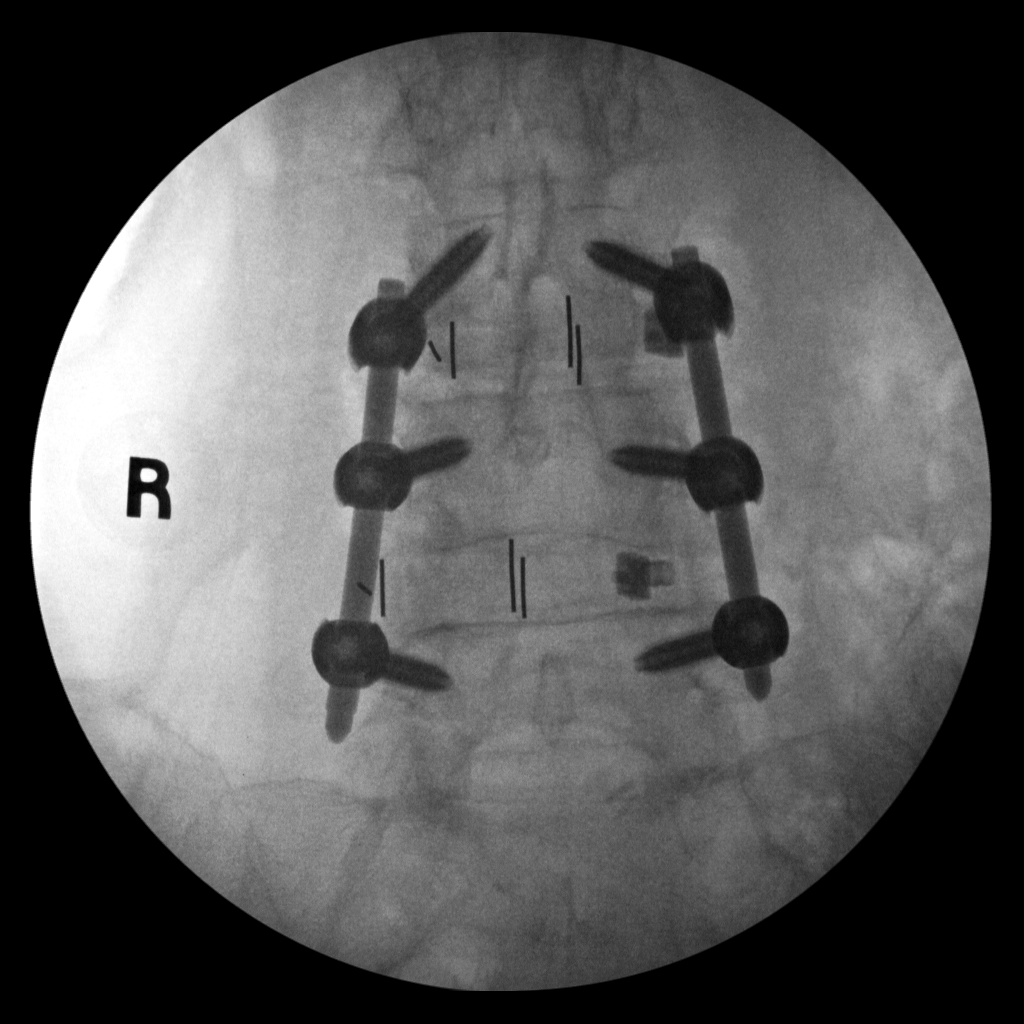

[5 of 5 positions shown; findings below may reference images not displayed]

FINDINGS: PA and lateral view intraoperative fluoroscopic images of the lumbar
spine are submitted, 5 images total. The images demonstrate a new
posterior spinal fusion construct spanning the L3-L5 levels
utilizing bilateral pedicle screws and vertical interconnecting
rods. Redemonstrated interbody spacers at L3-L4 and L4-L5.
IMPRESSION: 5 intraoperative fluoroscopic images of the lumbar spine, as
described.

## 2020-05-12 SURGERY — POSTERIOR LUMBAR FUSION 2 LEVEL
Anesthesia: General | Site: Spine Lumbar

## 2020-05-12 MED ORDER — SODIUM CHLORIDE 0.9% FLUSH
3.0000 mL | Freq: Two times a day (BID) | INTRAVENOUS | Status: DC
  Administered 2020-05-12: 3 mL via INTRAVENOUS

## 2020-05-12 MED ORDER — SUCCINYLCHOLINE CHLORIDE 200 MG/10ML IV SOSY
PREFILLED_SYRINGE | INTRAVENOUS | Status: AC
  Filled 2020-05-12: qty 10

## 2020-05-12 MED ORDER — ONDANSETRON HCL 4 MG PO TABS: 4.0000 mg | ORAL_TABLET | Freq: Four times a day (QID) | ORAL | Status: DC | PRN

## 2020-05-12 MED ORDER — BUPIVACAINE HCL (PF) 0.25 % IJ SOLN
INTRAMUSCULAR | Status: DC | PRN
  Administered 2020-05-12: 20 mL

## 2020-05-12 MED ORDER — THROMBIN (RECOMBINANT) 20000 UNITS EX SOLR
CUTANEOUS | Status: AC
  Filled 2020-05-12: qty 20000

## 2020-05-12 MED ORDER — ONDANSETRON HCL 4 MG/2ML IJ SOLN
INTRAMUSCULAR | Status: AC
  Filled 2020-05-12: qty 2

## 2020-05-12 MED ORDER — DOCUSATE SODIUM 100 MG PO CAPS: 100.0000 mg | ORAL_CAPSULE | Freq: Two times a day (BID) | ORAL | Status: DC

## 2020-05-12 MED ORDER — OXYCODONE-ACETAMINOPHEN 10-325 MG PO TABS: 1.0000 | ORAL_TABLET | Freq: Four times a day (QID) | ORAL | 0 refills | Status: AC | PRN

## 2020-05-12 MED ORDER — MENTHOL 3 MG MT LOZG: 1.0000 | LOZENGE | OROMUCOSAL | Status: DC | PRN

## 2020-05-12 MED ORDER — POLYETHYLENE GLYCOL 3350 17 G PO PACK: 17.0000 g | PACK | Freq: Every day | ORAL | Status: DC | PRN

## 2020-05-12 MED ORDER — MIDAZOLAM HCL 2 MG/2ML IJ SOLN
INTRAMUSCULAR | Status: AC
  Filled 2020-05-12: qty 2

## 2020-05-12 MED ORDER — FENTANYL CITRATE (PF) 250 MCG/5ML IJ SOLN
INTRAMUSCULAR | Status: AC
  Filled 2020-05-12: qty 5

## 2020-05-12 MED ORDER — BUPIVACAINE HCL (PF) 0.25 % IJ SOLN
INTRAMUSCULAR | Status: AC
  Filled 2020-05-12: qty 30

## 2020-05-12 MED ORDER — DEXAMETHASONE SODIUM PHOSPHATE 10 MG/ML IJ SOLN
INTRAMUSCULAR | Status: AC
  Filled 2020-05-12: qty 1

## 2020-05-12 MED ORDER — ACETAMINOPHEN 325 MG PO TABS
650.0000 mg | ORAL_TABLET | ORAL | Status: DC | PRN
  Administered 2020-05-12 – 2020-05-13 (×3): 650 mg via ORAL
  Filled 2020-05-12 (×5): qty 2

## 2020-05-12 MED ORDER — METHOCARBAMOL 500 MG PO TABS
500.0000 mg | ORAL_TABLET | Freq: Four times a day (QID) | ORAL | Status: DC | PRN
  Administered 2020-05-12 – 2020-05-13 (×2): 500 mg via ORAL
  Filled 2020-05-12 (×2): qty 1

## 2020-05-12 MED ORDER — PHENYLEPHRINE HCL-NACL 10-0.9 MG/250ML-% IV SOLN
INTRAVENOUS | Status: DC | PRN
  Administered 2020-05-12: 30 ug/min via INTRAVENOUS

## 2020-05-12 MED ORDER — ONDANSETRON HCL 4 MG/2ML IJ SOLN: 4.0000 mg | Freq: Four times a day (QID) | INTRAMUSCULAR | Status: DC | PRN

## 2020-05-12 MED ORDER — MORPHINE SULFATE (PF) 2 MG/ML IV SOLN
1.0000 mg | INTRAVENOUS | Status: DC | PRN
  Administered 2020-05-12: 1 mg via INTRAVENOUS
  Filled 2020-05-12: qty 1

## 2020-05-12 MED ORDER — DEXAMETHASONE SODIUM PHOSPHATE 4 MG/ML IJ SOLN
INTRAMUSCULAR | Status: DC | PRN
  Administered 2020-05-12: 10 mg via INTRAVENOUS

## 2020-05-12 MED ORDER — ONDANSETRON HCL 4 MG/2ML IJ SOLN: 4.0000 mg | Freq: Once | INTRAMUSCULAR | Status: DC | PRN

## 2020-05-12 MED ORDER — SODIUM CHLORIDE 0.9 % IV SOLN: 250.0000 mL | INTRAVENOUS | Status: DC

## 2020-05-12 MED ORDER — METHOCARBAMOL 1000 MG/10ML IJ SOLN
500.0000 mg | Freq: Four times a day (QID) | INTRAVENOUS | Status: DC | PRN
  Filled 2020-05-12: qty 5

## 2020-05-12 MED ORDER — PHENYLEPHRINE HCL (PRESSORS) 10 MG/ML IV SOLN
INTRAVENOUS | Status: DC | PRN
  Administered 2020-05-12: 40 ug via INTRAVENOUS

## 2020-05-12 MED ORDER — LIDOCAINE 2% (20 MG/ML) 5 ML SYRINGE
INTRAMUSCULAR | Status: AC
  Filled 2020-05-12: qty 5

## 2020-05-12 MED ORDER — EPINEPHRINE PF 1 MG/ML IJ SOLN
INTRAMUSCULAR | Status: AC
  Filled 2020-05-12: qty 1

## 2020-05-12 MED ORDER — PROPOFOL 10 MG/ML IV BOLUS
INTRAVENOUS | Status: AC
  Filled 2020-05-12: qty 20

## 2020-05-12 MED ORDER — 0.9 % SODIUM CHLORIDE (POUR BTL) OPTIME
TOPICAL | Status: DC | PRN
  Administered 2020-05-12: 1000 mL

## 2020-05-12 MED ORDER — LACTATED RINGERS IV SOLN: INTRAVENOUS | Status: DC | PRN

## 2020-05-12 MED ORDER — HYDROMORPHONE HCL 1 MG/ML IJ SOLN
INTRAMUSCULAR | Status: AC
  Filled 2020-05-12: qty 1

## 2020-05-12 MED ORDER — VANCOMYCIN HCL IN DEXTROSE 750-5 MG/150ML-% IV SOLN
750.0000 mg | Freq: Once | INTRAVENOUS | Status: AC
  Administered 2020-05-12: 750 mg via INTRAVENOUS
  Filled 2020-05-12: qty 150

## 2020-05-12 MED ORDER — LACTATED RINGERS IV SOLN: INTRAVENOUS | Status: DC

## 2020-05-12 MED ORDER — PROPOFOL 500 MG/50ML IV EMUL
INTRAVENOUS | Status: DC | PRN
  Administered 2020-05-12: 50 ug/kg/min via INTRAVENOUS

## 2020-05-12 MED ORDER — PHENOL 1.4 % MT LIQD: 1.0000 | OROMUCOSAL | Status: DC | PRN

## 2020-05-12 MED ORDER — OXYCODONE HCL 5 MG PO TABS: 5.0000 mg | ORAL_TABLET | Freq: Once | ORAL | Status: DC | PRN

## 2020-05-12 MED ORDER — DABIGATRAN ETEXILATE MESYLATE 150 MG PO CAPS: 150.0000 mg | ORAL_CAPSULE | Freq: Two times a day (BID) | ORAL | Status: DC

## 2020-05-12 MED ORDER — LIDOCAINE 2% (20 MG/ML) 5 ML SYRINGE
INTRAMUSCULAR | Status: DC | PRN
  Administered 2020-05-12: 60 mg via INTRAVENOUS

## 2020-05-12 MED ORDER — VANCOMYCIN HCL 1000 MG IV SOLR
INTRAVENOUS | Status: DC | PRN
  Administered 2020-05-12: 1000 mg via INTRAVENOUS

## 2020-05-12 MED ORDER — MIDAZOLAM HCL 5 MG/5ML IJ SOLN
INTRAMUSCULAR | Status: DC | PRN
  Administered 2020-05-12: 1 mg via INTRAVENOUS

## 2020-05-12 MED ORDER — OXYCODONE HCL 5 MG PO TABS: 5.0000 mg | ORAL_TABLET | ORAL | Status: DC | PRN

## 2020-05-12 MED ORDER — AMISULPRIDE (ANTIEMETIC) 5 MG/2ML IV SOLN: 10.0000 mg | Freq: Once | INTRAVENOUS | Status: DC | PRN

## 2020-05-12 MED ORDER — HYDROMORPHONE HCL 1 MG/ML IJ SOLN
0.2500 mg | INTRAMUSCULAR | Status: DC | PRN
  Administered 2020-05-12 (×2): 0.25 mg via INTRAVENOUS

## 2020-05-12 MED ORDER — ONDANSETRON HCL 4 MG/2ML IJ SOLN
INTRAMUSCULAR | Status: DC | PRN
  Administered 2020-05-12: 4 mg via INTRAVENOUS

## 2020-05-12 MED ORDER — GABAPENTIN 300 MG PO CAPS: 300.0000 mg | ORAL_CAPSULE | Freq: Three times a day (TID) | ORAL | Status: DC

## 2020-05-12 MED ORDER — PROPOFOL 10 MG/ML IV BOLUS
INTRAVENOUS | Status: DC | PRN
  Administered 2020-05-12: 200 mg via INTRAVENOUS

## 2020-05-12 MED ORDER — OXYCODONE HCL 5 MG/5ML PO SOLN: 5.0000 mg | Freq: Once | ORAL | Status: DC | PRN

## 2020-05-12 MED ORDER — METHOCARBAMOL 500 MG PO TABS: 500.0000 mg | ORAL_TABLET | Freq: Three times a day (TID) | ORAL | 0 refills | Status: AC | PRN

## 2020-05-12 MED ORDER — FENTANYL CITRATE (PF) 100 MCG/2ML IJ SOLN
INTRAMUSCULAR | Status: DC | PRN
  Administered 2020-05-12: 50 ug via INTRAVENOUS
  Administered 2020-05-12: 100 ug via INTRAVENOUS
  Administered 2020-05-12 (×2): 50 ug via INTRAVENOUS

## 2020-05-12 MED ORDER — OXYCODONE HCL 5 MG PO TABS
10.0000 mg | ORAL_TABLET | ORAL | Status: DC | PRN
  Administered 2020-05-12 – 2020-05-14 (×10): 10 mg via ORAL
  Filled 2020-05-12 (×10): qty 2

## 2020-05-12 MED ORDER — SUCCINYLCHOLINE CHLORIDE 20 MG/ML IJ SOLN
INTRAMUSCULAR | Status: DC | PRN
  Administered 2020-05-12: 100 mg via INTRAVENOUS

## 2020-05-12 MED ORDER — ACETAMINOPHEN 650 MG RE SUPP: 650.0000 mg | RECTAL | Status: DC | PRN

## 2020-05-12 MED ORDER — ONDANSETRON HCL 4 MG PO TABS: 4.0000 mg | ORAL_TABLET | Freq: Three times a day (TID) | ORAL | 0 refills | Status: DC | PRN

## 2020-05-12 MED ORDER — HEMOSTATIC AGENTS (NO CHARGE) OPTIME
TOPICAL | Status: DC | PRN
  Administered 2020-05-12: 1

## 2020-05-12 MED ORDER — PHENYLEPHRINE 40 MCG/ML (10ML) SYRINGE FOR IV PUSH (FOR BLOOD PRESSURE SUPPORT)
PREFILLED_SYRINGE | INTRAVENOUS | Status: AC
  Filled 2020-05-12: qty 10

## 2020-05-12 MED ORDER — SODIUM CHLORIDE 0.9% FLUSH: 3.0000 mL | INTRAVENOUS | Status: DC | PRN

## 2020-05-12 SURGICAL SUPPLY — 66 items
CABLE BIPOLOR RESECTION CORD (MISCELLANEOUS) ×3 IMPLANT
CLIP NEUROVISION LG (CLIP) ×3 IMPLANT
CLOSURE STERI-STRIP 1/2X4 (GAUZE/BANDAGES/DRESSINGS) ×2
CLSR STERI-STRIP ANTIMIC 1/2X4 (GAUZE/BANDAGES/DRESSINGS) ×4 IMPLANT
COVER MAYO STAND STRL (DRAPES) IMPLANT
COVER SURGICAL LIGHT HANDLE (MISCELLANEOUS) ×3 IMPLANT
COVER WAND RF STERILE (DRAPES) ×3 IMPLANT
DRAPE C-ARM 42X72 X-RAY (DRAPES) ×3 IMPLANT
DRAPE C-ARMOR (DRAPES) ×3 IMPLANT
DRAPE POUCH INSTRU U-SHP 10X18 (DRAPES) ×3 IMPLANT
DRAPE SURG 17X23 STRL (DRAPES) ×3 IMPLANT
DRAPE U-SHAPE 47X51 STRL (DRAPES) ×3 IMPLANT
DRSG OPSITE POSTOP 4X6 (GAUZE/BANDAGES/DRESSINGS) ×6 IMPLANT
DRSG OPSITE POSTOP 4X8 (GAUZE/BANDAGES/DRESSINGS) ×3 IMPLANT
DURAPREP 26ML APPLICATOR (WOUND CARE) ×3 IMPLANT
ELECT BLADE 4.0 EZ CLEAN MEGAD (MISCELLANEOUS)
ELECT BLADE 6.5 EXT (BLADE) ×3 IMPLANT
ELECT CAUTERY BLADE 6.4 (BLADE) ×3 IMPLANT
ELECT PENCIL ROCKER SW 15FT (MISCELLANEOUS) ×3 IMPLANT
ELECT REM PT RETURN 9FT ADLT (ELECTROSURGICAL) ×3
ELECTRODE BLDE 4.0 EZ CLN MEGD (MISCELLANEOUS) IMPLANT
ELECTRODE REM PT RTRN 9FT ADLT (ELECTROSURGICAL) ×1 IMPLANT
GLOVE BIOGEL PI IND STRL 8.5 (GLOVE) ×1 IMPLANT
GLOVE BIOGEL PI INDICATOR 8.5 (GLOVE) ×2
GLOVE SS BIOGEL STRL SZ 8.5 (GLOVE) ×1 IMPLANT
GLOVE SUPERSENSE BIOGEL SZ 8.5 (GLOVE) ×2
GOWN STRL REUS W/ TWL LRG LVL3 (GOWN DISPOSABLE) ×1 IMPLANT
GOWN STRL REUS W/TWL 2XL LVL3 (GOWN DISPOSABLE) ×6 IMPLANT
GOWN STRL REUS W/TWL LRG LVL3 (GOWN DISPOSABLE) ×2
GUIDEWIRE NITINOL BEVEL TIP (WIRE) ×18 IMPLANT
KIT BASIN OR (CUSTOM PROCEDURE TRAY) ×3 IMPLANT
KIT POSITION SURG JACKSON T1 (MISCELLANEOUS) ×3 IMPLANT
KIT TURNOVER KIT B (KITS) ×3 IMPLANT
MODULE EMG NEEDLE SSEP NVM5 (NEEDLE) ×3 IMPLANT
MODULE NVM5 NEXT GEN EMG (NEEDLE) ×3 IMPLANT
NEEDLE 22X1 1/2 (OR ONLY) (NEEDLE) ×3 IMPLANT
NEEDLE SPNL 18GX3.5 QUINCKE PK (NEEDLE) ×3 IMPLANT
NS IRRIG 1000ML POUR BTL (IV SOLUTION) ×3 IMPLANT
PACK LAMINECTOMY ORTHO (CUSTOM PROCEDURE TRAY) ×3 IMPLANT
PACK UNIVERSAL I (CUSTOM PROCEDURE TRAY) ×3 IMPLANT
PAD ARMBOARD 7.5X6 YLW CONV (MISCELLANEOUS) ×6 IMPLANT
PATTIES SURGICAL .5 X.5 (GAUZE/BANDAGES/DRESSINGS) IMPLANT
PATTIES SURGICAL .5 X1 (DISPOSABLE) ×3 IMPLANT
POSITIONER HEAD PRONE TRACH (MISCELLANEOUS) ×3 IMPLANT
PROBE BALL TIP NVM5 SNG USE (BALLOONS) ×3 IMPLANT
ROD RELINE TI MAS 5.5X70MM LD (Rod) ×6 IMPLANT
SCREW LOCK RELINE 5.5 TULIP (Screw) ×21 IMPLANT
SCREW RELINE RED 6.5X45MM POLY (Screw) ×18 IMPLANT
SPONGE LAP 4X18 RFD (DISPOSABLE) ×3 IMPLANT
SPONGE SURGIFOAM ABS GEL 100 (HEMOSTASIS) ×3 IMPLANT
SURGIFLO W/THROMBIN 8M KIT (HEMOSTASIS) ×3 IMPLANT
SUT BONE WAX W31G (SUTURE) ×3 IMPLANT
SUT MNCRL AB 3-0 PS2 18 (SUTURE) ×6 IMPLANT
SUT MNCRL AB 3-0 PS2 27 (SUTURE) ×3 IMPLANT
SUT VIC AB 1 CT1 18XCR BRD 8 (SUTURE) ×1 IMPLANT
SUT VIC AB 1 CT1 8-18 (SUTURE) ×2
SUT VIC AB 1 CTX 36 (SUTURE)
SUT VIC AB 1 CTX36XBRD ANBCTR (SUTURE) IMPLANT
SUT VIC AB 2-0 CT1 18 (SUTURE) ×6 IMPLANT
SUT VICRYL 0 UR6 27IN ABS (SUTURE) ×6 IMPLANT
SYR BULB IRRIG 60ML STRL (SYRINGE) ×3 IMPLANT
SYR CONTROL 10ML LL (SYRINGE) ×3 IMPLANT
TOWEL GREEN STERILE (TOWEL DISPOSABLE) ×3 IMPLANT
TOWEL GREEN STERILE FF (TOWEL DISPOSABLE) ×3 IMPLANT
WATER STERILE IRR 1000ML POUR (IV SOLUTION) ×3 IMPLANT
YANKAUER SUCT BULB TIP NO VENT (SUCTIONS) ×3 IMPLANT

## 2020-05-12 NOTE — Progress Notes (Signed)
[facility]   Subjective  -no complaints other than some incisional tenderness over her abdominal oblique incision.   Objective 118/83 80 98.5 F (36.9 C) (Oral) 18 97%  Intake/Output Summary (Last 24 hours) at 05/12/2020 0749 Last data filed at 05/12/2020 0600 Gross per 24 hour  Intake 2050 ml  Output 1300 ml  Net 750 ml    Abdomen is nondistended and soft and appropriate postop incisional tenderness Left DP palpable  Laboratory Lab Results: Recent Labs    05/12/20 0206  WBC 10.7*  HGB 10.0*  HCT 29.8*  PLT 137*   BMET Recent Labs    05/12/20 0206  NA 133*  K 3.7  CL 99  CO2 26  GLUCOSE 115*  BUN 15  CREATININE 0.97  CALCIUM 8.6*    COAG Lab Results  Component Value Date   INR 1.1 05/12/2020   INR 1.0 05/11/2020   INR 1.4 (H) 05/04/2020   No results found for: PTT  Assessment/Planning: POD #1 status post OLIF at L3-L4 and L4-L5. Seems to be doing well from exposure standpoint with just appropriate incisional tenderness and a palpable pulse in the left foot. She is going to the OR today for posterior approach. Please call vascular if questions or concerns. We will sign off.  Marty Heck 05/12/2020 7:49 AM --

## 2020-05-12 NOTE — Evaluation (Signed)
Physical Therapy Evaluation Patient Details Name: [name] MRN: [id number] DOB: December 21, 1945 Today's Date: 05/12/2020   History of Present Illness  pt is a 74 y/o female s/p OAm MVP, HTN, past lumbar surgery, s/p L3-5 fusion due to recurrent neurogenic claudication.  Clinical Impression  Pt admitted with/for lumbar fusion surgery.  Pt presently at mi to min guard level.  .  Pt currently limited functionally due to the problems listed below.  (see problems list.)  Pt will benefit from PT to maximize function and safety to be able to get home safely with available assist      Follow Up Recommendations No PT follow up;Supervision/Assistance - 24 hour (likely no follow up.)    Equipment Recommendations  None recommended by PT    Recommendations for Other Services       Precautions / Restrictions Precautions Precautions: Back Precaution Booklet Issued: No Required Braces or Orthoses: Spinal Brace Spinal Brace: Lumbar corset;Applied in sitting position Restrictions Weight Bearing Restrictions: No      Mobility  Bed Mobility Overal bed mobility: Needs Assistance Bed Mobility: Rolling;Sidelying to Sit;Sit to Sidelying Rolling: Min assist Sidelying to sit: Min assist     Sit to sidelying: Min assist General bed mobility comments: instructed in safe technique for rolling and transition from side.    Transfers Overall transfer level: Needs assistance Equipment used: Rolling walker (2 wheeled) Transfers: Sit to/from Stand Sit to Stand: Min assist         General transfer comment: cues for hand placement  Ambulation/Gait Ambulation/Gait assistance: Min guard Gait Distance (Feet): 130 Feet Assistive device: Rolling walker (2 wheeled) Gait Pattern/deviations: Step-through pattern   Gait velocity interpretation: <1.8 ft/sec, indicate of risk for recurrent falls General Gait Details: steady gait with shorter step length and light use of the RW.  Pt became dizzy and  mildly staggered toward end of trial in the halls  Stairs            Wheelchair Mobility    Modified Rankin (Stroke Patients Only)       Balance Overall balance assessment: No apparent balance deficits (not formally assessed)                                           Pertinent Vitals/Pain Pain Assessment: Faces Faces Pain Scale: Hurts a little bit Pain Location: back Pain Descriptors / Indicators: Discomfort Pain Intervention(s): Monitored during session;Premedicated before session    Home Living Family/patient expects to be discharged to:: Private residence Living Arrangements: Spouse/significant other Available Help at Discharge: Family;Available 24 hours/day Type of Home: House Home Access: Ramped entrance     Home Layout: One level Home Equipment: Walker - 2 wheels;Bedside commode;Shower seat;Grab bars - toilet;Grab bars - tub/shower;Toilet riser      Prior Function Level of Independence: Independent;Independent with assistive device(s)         Comments: pt's husband assists with some ADL;s and household/iADL's     Hand Dominance        Extremity/Trunk Assessment   Upper Extremity Assessment Upper Extremity Assessment: Overall WFL for tasks assessed    Lower Extremity Assessment Lower Extremity Assessment: Overall WFL for tasks assessed    Cervical / Trunk Assessment Cervical / Trunk Assessment: Other exceptions Cervical / Trunk Exceptions: surgical back  Communication   Communication: No difficulties  Cognition Arousal/Alertness: Awake/alert Behavior During Therapy: WFL for tasks assessed/performed  Overall Cognitive Status: Within Functional Limits for tasks assessed                                        General Comments General comments (skin integrity, edema, etc.): pt instructed in all back care/prec, log roll, bracing issues, lifting restrictions and progression of activity.    Exercises      Assessment/Plan    PT Assessment Patient needs continued PT services  PT Problem List Decreased activity tolerance;Decreased mobility;Decreased knowledge of use of DME;Decreased knowledge of precautions;Pain       PT Treatment Interventions DME instruction;Gait training;Stair training;Functional mobility training;Therapeutic activities;Patient/family education    PT Goals (Current goals can be found in the Care Plan section)  Acute Rehab PT Goals Patient Stated Goal: independent and able to do more for myself PT Goal Formulation: With patient Time For Goal Achievement: 05/19/20 Potential to Achieve Goals: Good    Frequency Min 5X/week   Barriers to discharge        Co-evaluation               AM-PAC PT "6 Clicks" Mobility  Outcome Measure Help needed turning from your back to your side while in a flat bed without using bedrails?: A Little Help needed moving from lying on your back to sitting on the side of a flat bed without using bedrails?: A Little Help needed moving to and from a bed to a chair (including a wheelchair)?: A Little Help needed standing up from a chair using your arms (e.g., wheelchair or bedside chair)?: A Little Help needed to walk in hospital room?: A Little Help needed climbing 3-5 steps with a railing? : A Little 6 Click Score: 18    End of Session   Activity Tolerance: Patient tolerated treatment well Patient left: in bed;with call bell/phone within reach;with SCD's reapplied Nurse Communication: Mobility status PT Visit Diagnosis: Other abnormalities of gait and mobility (R26.89);Pain Pain - part of body:  (back)    Time: 1521-1550 PT Time Calculation (min) (ACUTE ONLY): 29 min   Charges:   PT Evaluation $PT Eval Moderate Complexity: 1 Mod PT Treatments $Gait Training: 8-22 mins        05/12/2020  Ginger Carne., PT Acute Rehabilitation Services [phone]  (pager) [phone]  (office)  Tessie Fass Norinne Jeane 05/12/2020, 4:01  PM

## 2020-05-12 NOTE — Anesthesia Procedure Notes (Signed)
Procedure Name: Intubation Date/Time: 05/12/2020 7:44 AM Performed by: Ignacia Bayley, CRNA Pre-anesthesia Checklist: Patient identified, Patient being monitored, Timeout performed, Emergency Drugs available and Suction available Patient Re-evaluated:Patient Re-evaluated prior to induction Oxygen Delivery Method: Circle System Utilized Preoxygenation: Pre-oxygenation with 100% oxygen Induction Type: IV induction Ventilation: Mask ventilation without difficulty Laryngoscope Size: Miller and 2 Grade View: Grade I Tube type: Oral Tube size: 7.0 mm Number of attempts: 1 Airway Equipment and Method: stylet Placement Confirmation: ETT inserted through vocal cords under direct vision,  positive ETCO2 and breath sounds checked- equal and bilateral Secured at: 21 cm Tube secured with: Tape Dental Injury: Teeth and Oropharynx as per pre-operative assessment

## 2020-05-12 NOTE — Progress Notes (Signed)
Pharmacy Antibiotic Note  [name] is a 74 y.o. female admitted on 05/11/2020 s/p lumbar surgery.  Pharmacy has been consulted for vancomycin dosing for surgical prophylaxis. Confirmed with RN - patient does not have a drain. SCr 0.97. Vancomycin 1g IV given x 1 dose 11/10 PM.  Plan: Vancomycin 750mg  IV x 1 to be given 12hrs post-op per protocol Monitor SCr Pharmacy will s/o consult and monitor peripherally     Temp (24hrs), Avg:98.3 F (36.8 C), Min:97 F (36.1 C), Max:100 F (37.8 C)  Recent Labs  Lab 05/12/20 0206  WBC 10.7*  CREATININE 0.97    Estimated Creatinine Clearance: 50.2 mL/min (by C-G formula based on SCr of 0.97 mg/dL).    Allergies  Allergen Reactions  . Cephalexin Nausea Only    Arturo Morton, PharmD, BCPS Please check AMION for all [place] contact numbers Clinical Pharmacist 05/12/2020 12:09 PM

## 2020-05-12 NOTE — Progress Notes (Signed)
    Subjective: Procedure(s) (LRB): POSTERIOR LUMBAR FUSION INTERBODY L3-5 (N/A) Day of Surgery  Patient reports pain as 3 on 0-10 scale.  Reports decreased leg pain reports incisional back pain   Foley in place Negative bowel movement Positive flatus Negative chest pain or shortness of breath  Objective: Vital signs in last 24 hours: Temp:  [97 F (36.1 C)-100 F (37.8 C)] 98.5 F (36.9 C) (11/11 0336) Pulse Rate:  [74-86] 80 (11/11 0336) Resp:  [10-20] 18 (11/11 0336) BP: (102-135)/(60-83) 118/83 (11/11 0336) SpO2:  [93 %-100 %] 97 % (11/11 0336)  Intake/Output from previous day: 11/10 0701 - 11/11 0700 In: 2050 [I.V.:1300; IV Piggyback:750] Out: 1300 [Urine:1200; Blood:100]  Labs: Recent Labs    05/12/20 0206  WBC 10.7*  RBC 3.15*  HCT 29.8*  PLT 137*   Recent Labs    05/12/20 0206  NA 133*  K 3.7  CL 99  CO2 26  BUN 15  CREATININE 0.97  GLUCOSE 115*  CALCIUM 8.6*   Recent Labs    05/11/20 0736 05/12/20 0206  INR 1.0 1.1    Physical Exam: Neurologically intact ABD soft Neurovascular intact Dorsiflexion/Plantar flexion intact Incision: dressing C/D/I and no drainage Compartment soft There is no height or weight on file to calculate BMI.   Assessment/Plan: Patient stable  xrays n/a Patient reports improvement in the preoperative neuropathic leg pain. At this point time since there is improvement in the preoperative neuropathic leg pain we are going to move forward with posterior supplemental pedicle screw fixation without formal decompression.  I explained this to the patient in great detail and I reviewed the risks and benefits of the spine fusion with instrumentation.  All of her questions were addressed.    Melina Schools, MD [facility] [phone]

## 2020-05-12 NOTE — Anesthesia Postprocedure Evaluation (Signed)
Anesthesia Post Note  Patient: [name]  Procedure(s) Performed: POSTERIOR LUMBAR FUSION INTERBODY LUMBAR THREE-FIVE (N/A Spine Lumbar)     Patient location during evaluation: PACU Anesthesia Type: General Level of consciousness: awake and alert Pain management: pain level controlled Vital Signs Assessment: post-procedure vital signs reviewed and stable Respiratory status: spontaneous breathing, nonlabored ventilation and respiratory function stable Cardiovascular status: blood pressure returned to baseline and stable Postop Assessment: no apparent nausea or vomiting Anesthetic complications: no   No complications documented.  Last Vitals:  Vitals:   05/12/20 1140 05/12/20 1154  BP: (!) 115/98 (!) 118/59  Pulse: 71 72  Resp: 12 18  Temp: 36.7 C 36.8 C  SpO2: 94% 94%    Last Pain:  Vitals:   05/12/20 1154  TempSrc: Oral  PainSc:                  [name]

## 2020-05-12 NOTE — Anesthesia Procedure Notes (Signed)
Performed by: Ignacia Bayley, CRNA

## 2020-05-12 NOTE — Addendum Note (Signed)
Addendum  created 05/12/20 1329 by Ignacia Bayley, CRNA   Flowsheet accepted, Intraprocedure Flowsheets edited

## 2020-05-12 NOTE — Op Note (Signed)
Operative report.  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  Status post oblique lumbar interbody fusion L3-5. Degenerative spondylolisthesis L4-5  Postoperative diagnosis: Same  Operative procedure: Posterior pedicle screw fixation L3-5.  Complications: None  First Assistant: Estill Bamberg Ward  Implants: Nu[employer] MIS pedicle screw fixation.  6.5 x 45 mm length screws at all levels.  70 mm locking rods.  Neuro monitoring: All pedicle screws were tested after implantation there is no adverse free running EMG activity at greater than 40 mA.  No abnormal SSEP monitoring noted during the case.  EBL: Minimal  Indications: Maley is a very pleasant 74 year old woman who had a previous lumbar decompression and has had recurrent ongoing radicular leg pain.  Attempts at conservative management had failed to alleviate her symptoms and so he elected to move forward with surgery.  The patient is status post phase 1 which was interbody fixation and presents today for posterior supplemental pedicle screw fixation.  All appropriate risks benefits and alternatives were discussed with the patient and consent was obtained.  Operative report: Patient was brought the operating room and after successful induction of general anesthesia and endotracheal intubation she was turned prone onto the [facility] spine frame.  All bony prominences were well-padded and the back was prepped and draped in a standard fashion.  Timeout was taken to confirm patient procedure and all other important data.  Using fluoroscopic imaging identified the lateral borders of the L3, L4, and L5 pedicles bilaterally.  I infiltrated the skin with quarter percent Marcaine with epinephrine and then made a small incision and advanced the Jamshidi needle down to the L3 pedicle.  Using AP fluoroscopy I confirmed my starting position.  I then advanced the pedicle using both  fluoroscopic guidance as well as real-time EMG monitoring.  As I advanced into the pedicle I confirmed trajectory and position.  As I neared the medial wall of the pedicle as seen on the AP view I switched to the lateral view.  I confirmed that I was just beyond the posterior wall of the vertebral body confirming satisfactory positioning and trajectory.  I also had satisfactory EMG monitoring numbers indicating no evidence of pedicle breach or neural compression.  I advanced into the vertebral body and then placed the guidepin to cannulate the pedicle.  I repeated this exact same procedure at L4 and L5 and on the contralateral side.  Once all 6 pedicles were cannulated I then measured and obtained the self drilling self-tapping pedicle screw.  The 6.5 x 45 mm screw was advanced over the guidepin and advanced into the pedicle I again used fluoroscopy as well as neuro monitoring to confirm satisfactory position.  Once the pedicle screw was properly placed I remove the guidepin and repeated the same exact procedure at the remaining 5 pedicles.  Once all 6 pedicle screws were placed I then directly stimulated each pedicle.  There was no adverse activity at 40 mA.  Imaging studies in both the AP and lateral planes demonstrated satisfactory position and trajectory of all 6 screws.  I then measured and obtained a 70 mm rod.  This was advanced percutaneously.  I confirmed that I was properly seated and then placed the locking caps.  Locking caps were inserted and secured.  I then torqued the locking nuts to the final tightening per manufacture standards.  I then remove the insertion tabs and irrigated all 6 stab incisions copiously with normal saline.  Hemostasis was obtained using FloSeal as  well as bipolar cautery.  The wounds were then irrigated again and closed in a layered fashion with a 0 Vicryl suture, 2-0 Vicryl suture, and a 3-0 Monocryl stitch for the skin.  Steri-Strips and dry dressings were applied and the  patient was ultimately extubated and transferred the PACU without incident.  At the end of the case all needle sponge counts were correct.  There were no adverse intraoperative events.

## 2020-05-12 NOTE — Transfer of Care (Signed)
Immediate Anesthesia Transfer of Care Note  Patient: [name]  Procedure(s) Performed: POSTERIOR LUMBAR FUSION INTERBODY LUMBAR THREE-FIVE (N/A Spine Lumbar)  Patient Location: PACU  Anesthesia Type:General  Level of Consciousness: awake and sedated  Airway & Oxygen Therapy: Patient Spontanous Breathing and Patient connected to nasal cannula oxygen  Post-op Assessment: Report given to RN and Post -op Vital signs reviewed and stable  Post vital signs: Reviewed and stable  Last Vitals:  Vitals Value Taken Time  BP    Temp    Pulse 84 05/12/20 1010  Resp 16 05/12/20 1010  SpO2 95 % 05/12/20 1010  Vitals shown include unvalidated device data.  Last Pain:  Vitals:   05/12/20 0556  TempSrc:   PainSc: 3       Patients Stated Pain Goal: 3 (79/81/02 5486)  Complications: No complications documented.

## 2020-05-12 NOTE — Anesthesia Preprocedure Evaluation (Addendum)
Anesthesia Evaluation  Patient identified by MRN, date of birth, ID band Patient awake    Reviewed: Allergy & Precautions, NPO status , Patient's Chart, lab work & pertinent test results  History of Anesthesia Complications Negative for: history of anesthetic complications  Airway Mallampati: I  TM Distance: >3 FB Neck ROM: Full    Dental  (+) Edentulous Upper, Partial Lower   Pulmonary neg pulmonary ROS, former smoker,    Pulmonary exam normal        Cardiovascular hypertension, Pt. on medications and Pt. on home beta blockers + CAD, + Cardiac Stents (2010) and +CHF  Normal cardiovascular exam+ dysrhythmias (s/p ablation) Atrial Fibrillation   Echo 11/05/19: IMPRESSIONS  1. Left ventricular ejection fraction, by estimation, is 55 to 60%. The  left ventricle has normal function. The left ventricle has no regional  wall motion abnormalities. Left ventricular diastolic parameters are  consistent with Grade I diastolic  dysfunction (impaired relaxation).  2. Right ventricular systolic function is normal. The right ventricular  size is normal. There is normal pulmonary artery systolic pressure.  3. The mitral valve is normal in structure. Mild mitral valve  regurgitation. No evidence of mitral stenosis.  4. The aortic valve is normal in structure. Aortic valve regurgitation is  not visualized. No aortic stenosis is present.   Cardiac cath 10/26/19:  Ramus lesion is 35% stenosed.  Prox RCA to Mid RCA lesion is 25% stenosed.  Previously placed Mid RCA-1 stent (unknown type) is widely patent.  Mid RCA-2 lesion is 25% stenosed.  The left ventricular systolic function is normal.  LV end diastolic pressure is normal.  The left ventricular ejection fraction is 55-65% by visual estimate. 1. Nonobstructive CAD.  2. Normal LV function 3. Normal LVEDP Plan: continue medical management. May resume Pradaxa tomorrow am.     Neuro/Psych negative neurological ROS  negative psych ROS   GI/Hepatic Neg liver ROS, hiatal hernia, GERD  Medicated,  Endo/Other  negative endocrine ROS  Renal/GU negative Renal ROS     Musculoskeletal  (+) Arthritis ,   Abdominal   Peds  Hematology negative hematology ROS (+)   Anesthesia Other Findings  S/p OLIF yesterday; no anesthesia complications; no interval change overnight  Preoperative assessment by Dr. Rayann Heman on 04/18/20: "She is moderate risk for surgery Proceed if medically necessary without further CV testing Ok to hold pradaxa up to 3 days prior to the procedure and resume when able post operatively." She had non-obstructive CAD by 10/2019 cath. LVEF normal. Currently last Pradaxa dose is scheduled for 05/07/20.   Reproductive/Obstetrics                            Anesthesia Physical  Anesthesia Plan  ASA: III  Anesthesia Plan: General   Post-op Pain Management: GA combined w/ Regional for post-op pain   Induction: Intravenous  PONV Risk Score and Plan: 4 or greater and Treatment may vary due to age or medical condition, Ondansetron, Dexamethasone and Diphenhydramine  Airway Management Planned: Oral ETT  Additional Equipment:   Intra-op Plan:   Post-operative Plan: Extubation in OR  Informed Consent: I have reviewed the patients History and Physical, chart, labs and discussed the procedure including the risks, benefits and alternatives for the proposed anesthesia with the patient or authorized representative who has indicated his/her understanding and acceptance.     Dental advisory given  Plan Discussed with: Anesthesiologist and CRNA  Anesthesia Plan Comments: (PAT note written  05/05/2020 by Myra Gianotti, PA-C.)        Anesthesia Quick Evaluation

## 2020-05-13 MED ORDER — HYDROXYZINE HCL 50 MG/ML IM SOLN
50.0000 mg | Freq: Four times a day (QID) | INTRAMUSCULAR | Status: DC | PRN
  Administered 2020-05-13: 50 mg via INTRAMUSCULAR
  Filled 2020-05-13: qty 1

## 2020-05-13 MED ORDER — DABIGATRAN ETEXILATE MESYLATE 150 MG PO CAPS
150.0000 mg | ORAL_CAPSULE | Freq: Two times a day (BID) | ORAL | Status: DC
  Administered 2020-05-14: 150 mg via ORAL
  Filled 2020-05-13: qty 1

## 2020-05-13 MED FILL — Sodium Chloride IV Soln 0.9%: INTRAVENOUS | Qty: 1000 | Status: AC

## 2020-05-13 MED FILL — Heparin Sodium (Porcine) Inj 1000 Unit/ML: INTRAMUSCULAR | Qty: 30 | Status: AC

## 2020-05-13 NOTE — Progress Notes (Signed)
    Subjective: Procedure(s) (LRB): POSTERIOR LUMBAR FUSION INTERBODY LUMBAR THREE-FIVE (N/A) 1 Day Post-Op  Patient reports pain as 2 on 0-10 scale.  Reports decreased leg pain reports incisional back pain   Positive void Positive bowel movement Positive flatus Negative chest pain or shortness of breath  Objective: Vital signs in last 24 hours: Temp:  [97.7 F (36.5 C)-98.3 F (36.8 C)] 98.3 F (36.8 C) (11/12 0415) Pulse Rate:  [71-92] 81 (11/12 0415) Resp:  [12-20] 18 (11/12 0415) BP: (93-144)/(48-98) 115/52 (11/12 0415) SpO2:  [91 %-100 %] 95 % (11/12 0415)  Intake/Output from previous day: 11/11 0701 - 11/12 0700 In: 1620 [P.O.:120; I.V.:1500] Out: 920 [Urine:900; Blood:20]  Labs: Recent Labs    05/12/20 0206  WBC 10.7*  RBC 3.15*  HCT 29.8*  PLT 137*   Recent Labs    05/12/20 0206  NA 133*  K 3.7  CL 99  CO2 26  BUN 15  CREATININE 0.97  GLUCOSE 115*  CALCIUM 8.6*   Recent Labs    05/11/20 0736 05/12/20 0206  INR 1.0 1.1    Physical Exam: Neurologically intact ABD soft Intact pulses distally Dorsiflexion/Plantar flexion intact Incision: dressing C/D/I and no drainage Compartment soft There is no height or weight on file to calculate BMI.   Assessment/Plan: Patient stable  xrays n/a Continue mobilization with physical therapy Continue care  Advance diet Up with therapy  Patient is doing exceptionally well status post a 2 day anterior posterior spinal fusion.  The preoperative neuropathic leg pain is all but resolved and she only has incisional back pain.    She has had positive return of bowel function.  And her overall pain is significantly improved.  We will plan on keeping her today so she can continue working with therapy and ambulation.  We will restart her anticoagulation tomorrow morning and provided she remains stable plan on discharge Saturday late morning.  Instructions have been provided.  She will follow-up with me in 2  weeks for wound check.  Melina Schools, MD [facility] [phone]

## 2020-05-13 NOTE — Progress Notes (Addendum)
Physical Therapy Treatment Patient Details Name: [name] MRN: [id number] DOB: December 14, 1945 Today's Date: 05/13/2020    History of Present Illness pt is a 74 y/o female s/p OAm MVP, HTN, past lumbar surgery, s/p L3-5 fusion due to recurrent neurogenic claudication.    PT Comments    Pt supine in bed on arrival this session.  Pt required min - min G assistance throughout session.   She is independent with brace application and has great recall of spinal precautions.  Plan for home with no PT follow remains appropriate.    Follow Up Recommendations  No PT follow up     Equipment Recommendations  None recommended by PT    Recommendations for Other Services       Precautions / Restrictions Precautions Precautions: Back Precaution Booklet Issued: No Required Braces or Orthoses: Spinal Brace Spinal Brace: Lumbar corset;Applied in sitting position Restrictions Weight Bearing Restrictions: No    Mobility  Bed Mobility Overal bed mobility: Modified Independent Bed Mobility: Rolling;Sidelying to Sit Rolling: Min assist Sidelying to sit: Min assist       General bed mobility comments: Pt with good recall of log rolling, required min assistance to move trunk into sitting.  Transfers Overall transfer level: Needs assistance Equipment used: Rolling walker (2 wheeled) Transfers: Sit to/from Stand Sit to Stand: Min assist         General transfer comment: Min assistance to boost into standing.  Ambulation/Gait Ambulation/Gait assistance: Min guard Gait Distance (Feet): 200 Feet Assistive device: Rolling walker (2 wheeled) Gait Pattern/deviations: Step-through pattern;Trunk flexed     General Gait Details: Cues for scap retraction and upper trunk control.  Pt required assistance for RW safety.   Stairs Stairs: Yes Stairs assistance: Min assist Stair Management: One rail Left;Backwards;Forwards Number of Stairs: 1 General stair comments: Cues for sequencing.   Min guard to ascend and min assistance to descend backward.   Wheelchair Mobility    Modified Rankin (Stroke Patients Only)       Balance                                            Cognition Arousal/Alertness: Awake/alert Behavior During Therapy: WFL for tasks assessed/performed Overall Cognitive Status: Within Functional Limits for tasks assessed                                        Exercises      General Comments        Pertinent Vitals/Pain Pain Assessment: Faces Faces Pain Scale: Hurts little more Pain Location: back Pain Descriptors / Indicators: Discomfort Pain Intervention(s): Monitored during session;Repositioned    Home Living                      Prior Function            PT Goals (current goals can now be found in the care plan section) Acute Rehab PT Goals Patient Stated Goal: independent and able to do more for myself Potential to Achieve Goals: Good Progress towards PT goals: Progressing toward goals    Frequency    Min 5X/week      PT Plan Current plan remains appropriate    Co-evaluation  AM-PAC PT "6 Clicks" Mobility   Outcome Measure  Help needed turning from your back to your side while in a flat bed without using bedrails?: A Little Help needed moving from lying on your back to sitting on the side of a flat bed without using bedrails?: A Little Help needed moving to and from a bed to a chair (including a wheelchair)?: A Little Help needed standing up from a chair using your arms (e.g., wheelchair or bedside chair)?: A Little Help needed to walk in hospital room?: A Little Help needed climbing 3-5 steps with a railing? : A Little 6 Click Score: 18    End of Session Equipment Utilized During Treatment: Gait belt Activity Tolerance: Patient tolerated treatment well Patient left: in bed;with call bell/phone within reach;with SCD's reapplied Nurse Communication:  Mobility status PT Visit Diagnosis: Other abnormalities of gait and mobility (R26.89);Pain Pain - part of body:  (back)     Time: 8657-8469 PT Time Calculation (min) (ACUTE ONLY): 18 min  Charges:  $Gait Training: 8-22 mins                     Erasmo Leventhal , PTA Acute Rehabilitation Services Pager [phone] Office [phone]     Man Bonneau Eli Hose 05/13/2020, 1:11 PM

## 2020-05-13 NOTE — Evaluation (Signed)
Occupational Therapy Evaluation Patient Details Name: [name] MRN: [id number] DOB: [birth year] Today's Date: 05/13/2020    History of Present Illness pt is a 74 y/o female s/p OAm MVP, HTN, past lumbar surgery, s/p L3-5 fusion due to recurrent neurogenic claudication.   Clinical Impression   Patient educated in back precautions and how to adhere during ADLs. At baseline patient has assist for UB/LB dressing, educated patient in use of reacher to assist with LB dressing to reduce caregiver burden and patient return demo with min A. Patient demonstrate log roll, functional transfers and mobility without physical assistance while adhering to precautions. All acute OT education completed, will discontinue services at this time.     Follow Up Recommendations  No OT follow up    Equipment Recommendations  Other (comment) (LH AE)       Precautions / Restrictions Precautions Precautions: Back Precaution Booklet Issued: No Required Braces or Orthoses: Spinal Brace Spinal Brace: Lumbar corset;Applied in sitting position Restrictions Weight Bearing Restrictions: No      Mobility Bed Mobility Overal bed mobility: Modified Independent             General bed mobility comments: patient able to demo log rolling with HOB elevated and no physical assistance    Transfers Overall transfer level: Needs assistance Equipment used: Rolling walker (2 wheeled) Transfers: Sit to/from Stand Sit to Stand: Supervision         General transfer comment: supervision for safety    Balance Overall balance assessment: No apparent balance deficits (not formally assessed)                                         ADL either performed or assessed with clinical judgement   ADL Overall ADL's : Needs assistance/impaired     Grooming: Supervision/safety;Standing   Upper Body Bathing: Supervision/ safety;Sitting   Lower Body Bathing: Moderate assistance;Sitting/lateral  leans;Sit to/from stand   Upper Body Dressing : Minimal assistance;Standing Upper Body Dressing Details (indicate cue type and reason): to don bra, instruct patient to fasten bra in front then slide to back patient reports she does this sometimes "my husband helps me too" Lower Body Dressing: Moderate assistance;Sitting/lateral leans;Sit to/from stand;With adaptive equipment;Cueing for back precautions (mod A without AE, min A with AE) Lower Body Dressing Details (indicate cue type and reason): patient reports spouse assists with socks and threading LEs at baseline, instruct patient in use of reacher patient able to return demo to don underwear and pants with min A, able to pull up clothing over hips without assist Toilet Transfer: Supervision/safety;RW   Toileting- Clothing Manipulation and Hygiene: Supervision/safety;Sitting/lateral lean;Sit to/from stand       Functional mobility during ADLs: Supervision/safety;Rolling walker General ADL Comments: instruct patient in back precautions and how to maintain during self care, patient verbalize understanding and reports assist at baseline with ADLs such as dressing                  Pertinent Vitals/Pain Pain Assessment: Faces Faces Pain Scale: Hurts little more Pain Location: back Pain Descriptors / Indicators: Discomfort Pain Intervention(s): Monitored during session     Hand Dominance  (did not specify)   Extremity/Trunk Assessment Upper Extremity Assessment Upper Extremity Assessment: Overall WFL for tasks assessed   Lower Extremity Assessment Lower Extremity Assessment: Defer to PT evaluation   Cervical / Trunk Assessment Cervical / Trunk Assessment: Other  exceptions Cervical / Trunk Exceptions: surgical back   Communication Communication Communication: No difficulties   Cognition Arousal/Alertness: Awake/alert Behavior During Therapy: WFL for tasks assessed/performed Overall Cognitive Status: Within Functional Limits  for tasks assessed                                                Home Living Family/patient expects to be discharged to:: Private residence Living Arrangements: Spouse/significant other Available Help at Discharge: Family;Available 24 hours/day Type of Home: House Home Access: Ramped entrance     Home Layout: One level     Bathroom Shower/Tub: Teacher, early years/pre: Standard Bathroom Accessibility: Yes   Home Equipment: Environmental consultant - 2 wheels;Bedside commode;Shower seat;Grab bars - toilet;Grab bars - tub/shower;Toilet riser          Prior Functioning/Environment Level of Independence: Independent;Independent with assistive device(s)        Comments: patient's spouse assists with dressing and household IADLs        OT Problem List: Pain         OT Goals(Current goals can be found in the care plan section) Acute Rehab OT Goals Patient Stated Goal: independent and able to do more for myself OT Goal Formulation: With patient   AM-PAC OT "6 Clicks" Daily Activity     Outcome Measure Help from another person eating meals?: None Help from another person taking care of personal grooming?: A Little Help from another person toileting, which includes using toliet, bedpan, or urinal?: A Little Help from another person bathing (including washing, rinsing, drying)?: A Lot Help from another person to put on and taking off regular upper body clothing?: A Little Help from another person to put on and taking off regular lower body clothing?: A Lot 6 Click Score: 17   End of Session Equipment Utilized During Treatment: Rolling walker Nurse Communication: Mobility status  Activity Tolerance: Patient tolerated treatment well Patient left: Other (comment) (EOB)  OT Visit Diagnosis: Pain Pain - part of body:  (back)                Time: 9629-5284 OT Time Calculation (min): 23 min Charges:  OT General Charges $OT Visit: 1 Visit OT Evaluation $OT  Eval Low Complexity: 1 Low OT Treatments $Self Care/Home Management : 8-22 mins  Delbert Phenix OT OT pager: [phone]  Rosemary Holms 05/13/2020, 8:34 AM

## 2020-05-14 MED ORDER — METHOCARBAMOL 500 MG PO TABS
500.0000 mg | ORAL_TABLET | Freq: Four times a day (QID) | ORAL | Status: DC | PRN
  Administered 2020-05-14: 500 mg via ORAL
  Filled 2020-05-14: qty 1

## 2020-05-14 NOTE — Progress Notes (Signed)
Patient alert and oriented, mae's well, voiding adequate amount of urine, swallowing without difficulty, no c/o pain at time of discharge. Patient discharged home with family. Script and discharged instructions given to patient. Patient and family stated understanding of instructions given. Patient has an appointment with Dr. Brooks  

## 2020-05-14 NOTE — Progress Notes (Addendum)
Physical Therapy Treatment Patient Details Name: [name] MRN: [id number] DOB: 09/02/1945 Today's Date: 05/14/2020    History of Present Illness pt is a 74 y/o female s/p OAm MVP, HTN, past lumbar surgery, s/p L3-5 fusion due to recurrent neurogenic claudication.    PT Comments    Pt progressing well with post-op mobility. She was able to demonstrate transfers and ambulation with gross min guard assist to supervision for safety and RW for support. Pt complaining of 10/10 pain throughout gait training and RN was notified. Overall tolerated treatment well. Pt was educated on precautions, brace application/wearing schedule, appropriate activity progression, and car transfer. Will continue to follow.      Follow Up Recommendations  No PT follow up     Equipment Recommendations  None recommended by PT    Recommendations for Other Services       Precautions / Restrictions Precautions Precautions: Back Precaution Booklet Issued: Yes (comment) Precaution Comments: Reviewed throughout functional mobility.  Required Braces or Orthoses: Spinal Brace Spinal Brace: Lumbar corset;Applied in sitting position Restrictions Weight Bearing Restrictions: No    Mobility  Bed Mobility Overal bed mobility: Modified Independent Bed Mobility: Rolling;Sit to Sidelying Rolling: Supervision       Sit to sidelying: Min guard General bed mobility comments: Min guard provided for LE elevation back up into bed.   Transfers Overall transfer level: Needs assistance Equipment used: Rolling walker (2 wheeled) Transfers: Sit to/from Stand Sit to Stand: Supervision         General transfer comment: Close supervision for safety as pt powered up to full stand.  Ambulation/Gait Ambulation/Gait assistance: Min guard Gait Distance (Feet): 200 Feet Assistive device: Rolling walker (2 wheeled) Gait Pattern/deviations: Step-through pattern;Trunk flexed Gait velocity: Decreased Gait velocity  interpretation: <1.31 ft/sec, indicative of household ambulator General Gait Details: VC's for improved posture, closer walker proximity, and forward gaze. pt complaining of 10/10 pain throughout gait training.    Stairs             Wheelchair Mobility    Modified Rankin (Stroke Patients Only)       Balance Overall balance assessment: Mild deficits observed, not formally tested                                          Cognition Arousal/Alertness: Awake/alert Behavior During Therapy: WFL for tasks assessed/performed Overall Cognitive Status: Within Functional Limits for tasks assessed                                        Exercises      General Comments        Pertinent Vitals/Pain Pain Assessment: 0-10 Pain Score: 10-Worst pain ever Faces Pain Scale: Hurts even more Pain Location: incision area Pain Descriptors / Indicators: Discomfort;Operative site guarding;Sore Pain Intervention(s): Limited activity within patient's tolerance;Monitored during session;Repositioned;Patient requesting pain meds-RN notified    Home Living                      Prior Function            PT Goals (current goals can now be found in the care plan section) Acute Rehab PT Goals Patient Stated Goal: independent and able to do more for myself PT Goal Formulation: With patient Time For  Goal Achievement: 05/19/20 Potential to Achieve Goals: Good Progress towards PT goals: Progressing toward goals    Frequency    Min 5X/week      PT Plan Current plan remains appropriate    Co-evaluation              AM-PAC PT "6 Clicks" Mobility   Outcome Measure  Help needed turning from your back to your side while in a flat bed without using bedrails?: None Help needed moving from lying on your back to sitting on the side of a flat bed without using bedrails?: A Little Help needed moving to and from a bed to a chair (including a  wheelchair)?: A Little Help needed standing up from a chair using your arms (e.g., wheelchair or bedside chair)?: None Help needed to walk in hospital room?: A Little Help needed climbing 3-5 steps with a railing? : A Little 6 Click Score: 20    End of Session Equipment Utilized During Treatment: Gait belt Activity Tolerance: Patient tolerated treatment well Patient left: in bed;with call bell/phone within reach;with SCD's reapplied Nurse Communication: Mobility status PT Visit Diagnosis: Other abnormalities of gait and mobility (R26.89);Pain Pain - part of body:  (abdominal incision)     Time: 6269-4854 PT Time Calculation (min) (ACUTE ONLY): 18 min  Charges:  $Gait Training: 8-22 mins                     Rolinda Roan, PT, DPT Acute Rehabilitation Services Pager: [phone] Office: [phone]    Thelma Comp 05/14/2020, 11:25 AM

## 2020-05-14 NOTE — Discharge Instructions (Addendum)
  Spinal Fusion, Adult, Care After This sheet gives you information about how to care for yourself after your procedure. Your doctor may also give you more specific instructions. If you have problems or questions, contact your doctor. Follow these instructions at home: Medicines Take over-the-counter and prescription medicines only as told by your doctor. These include any medicines for pain or blood-thinning medicines (anticoagulants). If you were prescribed an antibiotic medicine, take it as told by your doctor. Do not stop taking the antibiotic even if you start to feel better. Do not drive for 24 hours if you were given a medicine to help you relax (sedative) during your procedure. Do not drive or use heavy machinery while taking prescription pain medicine. If you have a brace: Wear the brace as told by your doctor. Take it off only as told by your doctor. Keep the brace clean. Managing pain, stiffness, and swelling If directed, put ice on the surgery area: If you have a removable brace, take it off as told by your doctor. Put ice in a plastic bag. Place a towel between your skin and the bag. Leave the ice on for 20 minutes, 2-3 times a day. Surgery cut care    Follow instructions from your doctor about how to take care of your cut from surgery (incision). Make sure you: Wash your hands with soap and water before you change your bandage (dressing). If you cannot use soap and water, use hand sanitizer. Change your bandage as told by your doctor. Leave stitches (sutures), skin glue, or skin tape (adhesive) strips in place. They may need to stay in place for 2 weeks or longer. If tape strips get loose and curl up, you may trim the loose edges. Do not remove tape strips completely unless your doctor says it is okay. Keep your cut from surgery clean and dry. Do not take baths, swim, or use a hot tub until your doctor says it is okay. Ask your doctor if you can take showers. You may only  be allowed to take sponge baths. Every day, check your cut from surgery and the area around it for: More redness, swelling, or pain. Fluid or blood. Warmth. Pus or a bad smell. If you have a drain tube, follow instructions from your doctor about caring for it. Do not take out the drain tube or any bandages unless your doctor says it is okay. Physical activity Rest and protect your back as much as possible. Follow instructions from your doctor about how to move. Use good posture to help your spine heal. Do not lift anything that is heavier than 8 lb (3.6 kg), or the limit that you are told, until your doctor says that it is safe. Do not twist or bend at the waist until your doctor says it is okay. It is best if you: Do not make pushing and pulling motions. Do not sit or lie down in the same position for a long time. Do not raise your hands or arms above your head. Return to your normal activities as told by your doctor. Ask your doctor what activities are safe for you. Rest and protect your back as much as you can. Do not start to exercise until your doctor says it is okay. Ask your doctor what kinds of exercise you can do to make your back stronger. Ok to shower in 5 days.  Do not take a bath or submerge the wound General instructions To prevent blood clots and lessen swelling   in your legs: Wear compression stockings as told. Walk one or more times every few hours as told by your doctor. Do not use any products that contain nicotine or tobacco, such as cigarettes and e-cigarettes. These can delay bone healing. If you need help quitting, ask your doctor. To prevent or treat constipation while you are taking prescription pain medicine, your doctor may suggest that you: Drink enough fluid to keep your pee (urine) pale yellow. Take over-the-counter or prescription medicines. Eat foods that are high in fiber. These include fresh fruits and vegetables, whole grains, and beans. Limit foods that  are high in fat and processed sugars, such as fried and sweet foods. Keep all follow-up visits as told by your doctor. This is important. Contact a doctor if: Your pain gets worse. Your medicine does not help your pain. Your legs or feet get painful or swollen. Your cut from surgery is more red, swollen, or painful. Your cut from surgery feels warm to the touch. You have: Fluid or blood coming from your cut from surgery. Pus or a bad smell coming from your cut from surgery. A fever. Weakness or loss of feeling (numbness) in your legs that is new or getting worse. Trouble controlling when you pee (urinate) or poop (have a bowel movement). You feel sick to your stomach (nauseous). You throw up (vomit). Get help right away if: Your pain is very bad. You have chest pain. You have trouble breathing. You start to have a cough. These symptoms may be an emergency. Do not wait to see if the symptoms will go away. Get medical help right away. Call your local emergency services (911 in the [place] Do not drive yourself to the hospital. Summary After the procedure, it is common to have pain in your back and pain by your surgery cut(s). Icing and pain medicines may help to control the pain. Follow directions from your doctor. Rest and protect your back as much as possible. Do not twist or bend at the waist. Get up and walk one or more times every few hours as told by your doctor. This information is not intended to replace advice given to you by your health care provider. Make sure you discuss any questions you have with your health care provider.  -signs and symptoms of a blood clot such as chest pain; shortness of breath; pain, swelling, or warmth in the leg -signs and symptoms of a stroke such as changes in vision; confusion; trouble speaking or understanding; severe headaches; sudden numbness or weakness of the face, arm or leg; trouble walking; dizziness; loss of coordination Side effects that  usually do not require medical attention (report to your doctor or health care professional if they continue or are bothersome): -hair loss -pain, redness, or irritation at site where injected This list may not describe all possible side effects. Call your doctor for medical advice about side effects. You may report side effects to FDA at 1-800-FDA-1088. Where should I keep my medicine? Keep out of the reach of children. Store at room temperature between 15 and 30 degrees C (59 and 86 degrees F). Do not freeze. If your injections have been specially prepared, you may need to store them in the refrigerator. Ask your pharmacist. Throw away any unused medicine after the expiration date. NOTE: This sheet is a summary. It may not cover all possible information. If you have questions about this medicine, talk to your doctor, pharmacist, or health care provider.       86 degrees F). Do not freeze. If your injections have been specially prepared, you may need to store them in the refrigerator. Ask your pharmacist. Throw away any unused medicine after the expiration date. NOTE: This sheet is a summary. It may not cover all possible information. If you have questions about this medicine, talk to your doctor, pharmacist, or health care provider.

## 2020-05-14 NOTE — Progress Notes (Signed)
[name]  MRN: [id number] DOB/Age: 1946-02-09 74 y.o. Physician: [name], Renee.D. 2 Days Post-Op Procedure(s) (LRB): POSTERIOR LUMBAR FUSION INTERBODY LUMBAR THREE-FIVE (N/A)  Subjective: Patient sitting at bedside, dressed and anxious for return to home.  Had BM this morning. Vital Signs Temp:  [98 F (36.7 C)-99 F (37.2 C)] 98.1 F (36.7 C) (11/13 0800) Pulse Rate:  [82-95] 83 (11/13 0800) Resp:  [18] 18 (11/13 0800) BP: (102-165)/(60-95) 165/85 (11/13 0800) SpO2:  [92 %-100 %] 92 % (11/13 0800)  Lab Results Recent Labs    05/12/20 0206  WBC 10.7*  HGB 10.0*  HCT 29.8*  PLT 137*   BMET Recent Labs    05/12/20 0206  NA 133*  K 3.7  CL 99  CO2 26  GLUCOSE 115*  BUN 15  CREATININE 0.97  CALCIUM 8.6*   POC INR  Date Value Ref Range Status  09/08/2010 2.0     INR  Date Value Ref Range Status  05/12/2020 1.1 0.8 - 1.2 Final    Comment:    (NOTE) INR goal varies based on device and disease states. Performed at [facility], [place] [address] [place], [place] [id number]      Exam  Patient sitting comfortably.  Reports minimal pain.  Neuro exam grossly nonfocal.  Plan Discharge home today.  Prescriptions available for patient.  Scheduled for follow-up in 2 weeks with Dr. Rolena Krause. [name] [name] 05/14/2020, 10:47 AM   Contact # [phone]

## 2020-05-16 NOTE — Discharge Summary (Signed)
Patient ID: [name] MRN: [id number] DOB/AGE: 02-05-1946 74 y.o.  Admit date: 05/11/2020 Discharge date: 05/16/2020  Admission Diagnoses:  Active Problems:   S/P lumbar fusion   Discharge Diagnoses:  Active Problems:   S/P lumbar fusion  status post Procedure(s): POSTERIOR LUMBAR FUSION INTERBODY LUMBAR THREE-FIVE  Past Medical History:  Diagnosis Date  . Anticoagulated    Pradaxa  . Bilateral leg pain    Right leg has greater pain than the left.  . Coronary artery disease cardiologist-- dr Rayann Heman   a.  s/p Xience DES to RCA 04/2009;   b. TEE 2/12: EF 40%, Large PFO;  c.  Lexiscan Myoview 05/2012: EF 69%, no ischemia. LHC (05/2012):  Ostial diagonal 30-40%, proximal mid ramus intermedius 40-50%, RCA stent patent with 40-50% after stent, then 40%, distal RCA 40-50%, EF 55-65%. Medical therapy continued.;  d.  Carlton Adam Myoview (06/2013):  No ischemia, EF 83%, normal study  . Full dentures   . GERD (gastroesophageal reflux disease)   . History of basal cell carcinoma excision    scalp  . History of hiatal hernia   . History of kidney stones 51 years ago   x1  . History of recurrent UTIs   . Hypertension   . IFG (impaired fasting glucose)   . MVP (mitral valve prolapse)    mild per last echo 04-03-2017 in epic  . OA (osteoarthritis)    knees , fingers  . PAF (paroxysmal atrial fibrillation) [facility])    cardiologist-- dr Rayann Heman  . S/P ablation of atrial fibrillation 08/15/2010  . S/P drug eluting coronary stent placement 04/04/2009  . Spinal stenosis   . Tremor   . Wears glasses     Surgeries: Procedure(s): POSTERIOR LUMBAR FUSION INTERBODY LUMBAR THREE-FIVE on 05/12/2020   Consultants:   Discharged Condition: Improved  Hospital Course: [name] is an 74 y.o. female who was admitted 05/11/2020 for operative treatment of Postlaminectomy syndrome with recurrent foraminal stenosis and neurogenic claudication L3-5.  Degenerative spondylolisthesis L4-5.  Patient failed conservative treatments (please see the history and physical for the specifics) and had severe unremitting pain that affects sleep, daily activities and work/hobbies. After pre-op clearance, the patient was taken to the operating room on 05/12/2020 and underwent  Procedure(s): POSTERIOR LUMBAR FUSION INTERBODY LUMBAR THREE-FIVE.    Patient was given perioperative antibiotics:  Anti-infectives (From admission, onward)   Start     Dose/Rate Route Frequency Ordered Stop   05/13/20 0000  vancomycin (VANCOCIN) IVPB 750 mg/150 ml premix        750 mg 150 mL/hr over 60 Minutes Intravenous  Once 05/12/20 1211 05/13/20 0005   05/11/20 2000  vancomycin (VANCOCIN) IVPB 1000 mg/200 mL premix        1,000 mg 200 mL/hr over 60 Minutes Intravenous  Once 05/11/20 1600 05/11/20 2031   05/11/20 0645  vancomycin (VANCOCIN) IVPB 1000 mg/200 mL premix  Status:  Discontinued        1,000 mg 200 mL/hr over 60 Minutes Intravenous 60 min pre-op 05/11/20 0645 05/11/20 1509       Patient was given sequential compression devices and early ambulation to prevent DVT.   Patient benefited maximally from hospital stay and there were no complications. At the time of discharge, the patient was urinating/moving their bowels without difficulty, tolerating a regular diet, pain is controlled with oral pain medications and they have been cleared by PT/OT.   Recent vital signs: No data found.   Recent laboratory studies: No results  for input(s): WBC, HGB, HCT, PLT, NA, K, CL, CO2, BUN, CREATININE, GLUCOSE, INR, CALCIUM in the last 72 hours.  Invalid input(s): PT, 2   Discharge Medications:   Allergies as of 05/14/2020      Reactions   Cephalexin Nausea Only      Medication List    STOP taking these medications   traZODone 50 MG tablet Commonly known as: DESYREL     TAKE these medications   dabigatran 150 MG Caps capsule Commonly known as: Pradaxa Take 1 capsule (150 mg total) by mouth 2 (two)  times daily.   diphenhydrAMINE 25 mg capsule Commonly known as: BENADRYL Take 25 mg by mouth at bedtime.   hydrochlorothiazide 12.5 MG tablet Commonly known as: HYDRODIURIL Take 1 tablet (12.5 mg total) by mouth daily.   isosorbide mononitrate 30 MG 24 hr tablet Commonly known as: IMDUR Take 30 mg by mouth daily.   lisinopril 10 MG tablet Commonly known as: ZESTRIL Take 1 tablet (10 mg total) by mouth daily.   methocarbamol 500 MG tablet Commonly known as: Robaxin Take 1 tablet (500 mg total) by mouth every 8 (eight) hours as needed for up to 5 days for muscle spasms.   metoprolol succinate 25 MG 24 hr tablet Commonly known as: TOPROL-XL TAKE 1 TABLET TWICE A DAY   nitroGLYCERIN 0.4 MG SL tablet Commonly known as: NITROSTAT DISSOLVE 1 TABLET UNDER THE TONGUE EVERY 5 MINUTES AS NEEDED FOR CHEST PAIN What changed: See the new instructions.   ondansetron 4 MG tablet Commonly known as: Zofran Take 1 tablet (4 mg total) by mouth every 8 (eight) hours as needed for nausea or vomiting.   oxyCODONE-acetaminophen 10-325 MG tablet Commonly known as: Percocet Take 1 tablet by mouth every 6 (six) hours as needed for up to 5 days for pain.   pantoprazole 40 MG tablet Commonly known as: PROTONIX Take 1 tablet (40 mg total) by mouth 2 (two) times daily. NEEDS VISIT FOR FURTHER REFILLS   primidone 50 MG tablet Commonly known as: MYSOLINE Take 2 tablets (100 mg total) by mouth at bedtime.   rosuvastatin 40 MG tablet Commonly known as: CRESTOR TAKE 1 TABLET DAILY       Diagnostic Studies: DG Chest 2 View  Result Date: 05/05/2020 CLINICAL DATA:  Preop for lumbar surgery. EXAM: CHEST - 2 VIEW COMPARISON:  July 15, 2013. FINDINGS: The heart size and mediastinal contours are within normal limits. Nodular density seen over left lower lobe which may represent overlying nipple shadow. No consolidative process is noted. The visualized skeletal structures are unremarkable. IMPRESSION:  Nodular density seen over left lower lobe which may represent overlying nipple shadow. Repeat radiograph nipple markers is recommended to rule out pulmonary nodule. Electronically Signed   By: Marijo Conception M.D.   On: 05/05/2020 16:56   DG Lumbar Spine 2-3 Views  Result Date: 05/11/2020 CLINICAL DATA:  Surgical fusion of L3-4 and L4-5. EXAM: LUMBAR SPINE - 2-3 VIEW; DG C-ARM 1-60 MIN Radiation exposure index: 112.04 mGy. COMPARISON:  None. FINDINGS: Two intraoperative fluoroscopic images were obtained of the lower lumbar spine. These demonstrate the patient be status post interbody fusion of L3-4 and L4-5. IMPRESSION: Fluoroscopic guidance provided during lumbar fusion. Electronically Signed   By: Marijo Conception M.D.   On: 05/11/2020 12:09   DG Lumbar Spine Complete  Result Date: 05/12/2020 CLINICAL DATA:  Surgery, elective. Additional history provided: L3-5 pedicle screws. Provided fluoroscopy time 2 minutes, 49 seconds (125.85 mGy). EXAM: LUMBAR  SPINE - COMPLETE 4+ VIEW; DG C-ARM 1-60 MIN COMPARISON:  Lumbar spine radiographs 05/11/2020. FINDINGS: PA and lateral view intraoperative fluoroscopic images of the lumbar spine are submitted, 5 images total. The images demonstrate a new posterior spinal fusion construct spanning the L3-L5 levels utilizing bilateral pedicle screws and vertical interconnecting rods. Redemonstrated interbody spacers at L3-L4 and L4-L5. IMPRESSION: 5 intraoperative fluoroscopic images of the lumbar spine, as described. Electronically Signed   By: Kellie Simmering DO   On: 05/12/2020 09:43   DG C-Arm 1-60 Min  Result Date: 05/12/2020 CLINICAL DATA:  Surgery, elective. Additional history provided: L3-5 pedicle screws. Provided fluoroscopy time 2 minutes, 49 seconds (125.85 mGy). EXAM: LUMBAR SPINE - COMPLETE 4+ VIEW; DG C-ARM 1-60 MIN COMPARISON:  Lumbar spine radiographs 05/11/2020. FINDINGS: PA and lateral view intraoperative fluoroscopic images of the lumbar spine are  submitted, 5 images total. The images demonstrate a new posterior spinal fusion construct spanning the L3-L5 levels utilizing bilateral pedicle screws and vertical interconnecting rods. Redemonstrated interbody spacers at L3-L4 and L4-L5. IMPRESSION: 5 intraoperative fluoroscopic images of the lumbar spine, as described. Electronically Signed   By: Kellie Simmering DO   On: 05/12/2020 09:43   DG C-Arm 1-60 Min  Result Date: 05/11/2020 CLINICAL DATA:  Surgical fusion of L3-4 and L4-5. EXAM: LUMBAR SPINE - 2-3 VIEW; DG C-ARM 1-60 MIN Radiation exposure index: 112.04 mGy. COMPARISON:  None. FINDINGS: Two intraoperative fluoroscopic images were obtained of the lower lumbar spine. These demonstrate the patient be status post interbody fusion of L3-4 and L4-5. IMPRESSION: Fluoroscopic guidance provided during lumbar fusion. Electronically Signed   By: Marijo Conception M.D.   On: 05/11/2020 12:09   DG OR LOCAL ABDOMEN  Result Date: 05/11/2020 CLINICAL DATA:  74 year old female lumbar surgery, plain for instrument count EXAM: OR LOCAL ABDOMEN COMPARISON:  None. FINDINGS: Limited intraoperative x-ray of the abdomen, status post lumbar surgery. Surgical staples project over the soft tissues. Gas within the soft tissues of the left abdomen. No unexpected radiopaque foreign body. Disc spacers at the L3-L4 and L4-L5 levels. IMPRESSION: Intraoperative plain film, with no unexpected radiopaque foreign body. Electronically Signed   By: Corrie Mckusick D.O.   On: 05/11/2020 11:41    Discharge Instructions    Discharge patient   Complete by: As directed    Discharge disposition: 01-Home or Self Care   Discharge patient date: 05/14/2020   Incentive spirometry RT   Complete by: As directed    Incentive spirometry RT   Complete by: As directed        Follow-up Information    Melina Schools, MD. Schedule an appointment as soon as possible for a visit in 2 weeks.   Specialty: Orthopedic Surgery Why: If symptoms  worsen, For suture removal, For wound re-check Contact information: [address] [place] [address] [phone]               Discharge Plan:  discharge to home  Disposition: stable    Signed: Yvonne Kendall Yarnell Kozloski for [facility] PA-C [facility] [phone] 05/16/2020, 4:41 PM

## 2020-05-22 ENCOUNTER — Other Ambulatory Visit: Payer: Self-pay | Admitting: Internal Medicine

## 2020-06-16 ENCOUNTER — Encounter (HOSPITAL_COMMUNITY): Payer: Self-pay | Admitting: Orthopedic Surgery

## 2020-07-13 ENCOUNTER — Ambulatory Visit (INDEPENDENT_AMBULATORY_CARE_PROVIDER_SITE_OTHER): Payer: Medicare Other | Admitting: Obstetrics and Gynecology

## 2020-07-13 ENCOUNTER — Other Ambulatory Visit: Payer: Self-pay

## 2020-07-13 ENCOUNTER — Encounter: Payer: Self-pay | Admitting: Obstetrics and Gynecology

## 2020-07-13 VITALS — BP 128/78

## 2020-07-13 DIAGNOSIS — Z124 Encounter for screening for malignant neoplasm of cervix: Secondary | ICD-10-CM

## 2020-07-13 DIAGNOSIS — Z1272 Encounter for screening for malignant neoplasm of vagina: Secondary | ICD-10-CM | POA: Diagnosis not present

## 2020-07-13 DIAGNOSIS — N950 Postmenopausal bleeding: Secondary | ICD-10-CM

## 2020-07-13 NOTE — Progress Notes (Signed)
[name]  21-Oct-1945 [id number]  HPI 75 y.o. [id number] who presents today for evaluation 2-day history of vaginal bleeding.  She noticed this when sitting down on the toilet yesterday prior to wiping and started having profuse bleeding.  The bleeding has gradually slowed since yesterday but still having a little bit of bleeding requiring her to wear a pad and depends.  She had a hysterectomy with patient reported ovary removal in 1977 for fibroids and endometriosis.  No recall of any precancerous or cancer tissue.  Last time she used vaginal estrogen was about 10 years ago.  Not sexually active and denies placement of any objects in the vaginal area.  No dysuria.  No blood in stool or melena.  She is not having any pain, vaginal discharge, or odor.  No known history of abnormal Pap smears.  No vaginal trauma.  She does have a chronic external vulvar irritation and she does use some Vagisil wipes about twice per day but indicates she only uses them externally on the vagina.  She did have a 1 month history of intermittent vaginal bleeding 08/2019 for which she saw [place] for evaluation at that time without any findings then.  The only other recent medical history that has changed was that she had a lumbar spinal fusion surgery 05/2020.  Past medical history,surgical history, problem list, medications, allergies, family history and social history were all reviewed and documented as reviewed in the EPIC chart.  ROS:  Feeling well. No dyspnea or chest pain on exertion.  No abdominal pain, change in bowel habits, black or bloody stools.  No urinary tract symptoms. GYN ROS: + abnormal bleeding, no pelvic pain or discharge  Physical Exam  BP 128/78 (BP Location: Right Arm, Patient Position: Sitting, Cuff Size: Normal)   General: Pleasant female, no acute distress, alert and oriented PELVIC EXAM: VULVA: normal appearing vulva with light blood staining, no masses, tenderness or lesions, atrophic vulvar changes  noted, no lacerations, VAGINA: normal appearing vagina with normal color and discharge, no lesions, no bleeding, no laceration, no masses, no active bleeding, 2 pea-sized areas of hemostatic denuded vaginal epithelium on right upper vaginal sidewall and also anteriorly just deep to the suburethral tissues the speculum was turned 90 degrees to more closely examine the anterior and posterior vaginal walls with no other additional findings.  CERVIX: surgically absent, UTERUS: surgically absent, vaginal cuff well healed, ADNEXA: no masses, non tender.  Rectal: No hemorrhoids externally Vaginal cuff Pap smear is obtained  [name] present for the examination  Assessment 75 yo postmenopausal female with vaginal bleeding episodes of uncertain etiology.  Prior hysterectomy noted for benign disease in the distant past.  Plan Discussed that it is unclear as to the source of her vaginal bleeding.  The only thing I note on exam today are two small hemostatic but denuded areas of epithelium on the vaginal sidewall and suburethral tissue, the patient denies any sort of vaginal insertion of any objects or products, so I am not sure where these would have come from.  This could simply be because due to minor trauma in the atrophic tissues from cleansing or another cause.  Today there are no abnormalities.  Since she has had a recurrent bleeding episode I did go ahead and collect a vaginal Pap smear today.  I reminded her to be cautious with cleansing practices and if this bleeding is continuing, she will need to return for evaluation.  If bleeding were to continue may  need to consider vaginal colposcopy versus exam under anesthesia.   Joseph Pierini MD 07/13/20

## 2020-07-14 ENCOUNTER — Telehealth: Payer: Self-pay | Admitting: *Deleted

## 2020-07-14 ENCOUNTER — Encounter: Payer: Self-pay | Admitting: *Deleted

## 2020-07-14 LAB — PAP IG W/ RFLX HPV ASCU

## 2020-07-14 NOTE — Telephone Encounter (Signed)
Patient was seen yesterday for vaginal bleeding, called today stating her physical therapist is requiring her to provide a note stating its okay for her to return back to therapy. Patient said she has physical therapy to help with her back surgery in November. Patient said she is still bleeding,but its not heavy wears a pad. If you chose to provide patient with note let me know what you want it to say and I will type it up and leave for patient to pickup. Please advise

## 2020-07-14 NOTE — Telephone Encounter (Signed)
From a gynecologic standpoint (and specifically with evaluation of the vaginal bleeding 07/13/2020) the patient is okay to resume physical therapy.  Clearance for therapy from a broader medical standpoint should come from the patient's primary care provider.

## 2020-07-14 NOTE — Telephone Encounter (Signed)
Patient informed, she asked if I could fax the letter to [phone]. Copy faxed

## 2020-09-21 ENCOUNTER — Other Ambulatory Visit: Payer: Self-pay | Admitting: Internal Medicine

## 2020-10-02 ENCOUNTER — Other Ambulatory Visit: Payer: Self-pay | Admitting: Adult Health

## 2020-10-02 DIAGNOSIS — G250 Essential tremor: Secondary | ICD-10-CM

## 2020-10-14 ENCOUNTER — Other Ambulatory Visit: Payer: Self-pay | Admitting: Internal Medicine

## 2020-10-14 NOTE — Telephone Encounter (Signed)
Pt last saw Dr Rayann Heman 04/18/20, last labs 05/12/20 Creat 0.97, age 75, weight 86.1kg, CrCl 67.07, based on CrCl pt is on appropriate dosage of Pradaxa 150mg  BID.  Will refill rx.

## 2021-02-27 DIAGNOSIS — M7061 Trochanteric bursitis, right hip: Secondary | ICD-10-CM | POA: Insufficient documentation

## 2021-03-21 ENCOUNTER — Other Ambulatory Visit: Payer: Self-pay | Admitting: Internal Medicine

## 2021-04-10 ENCOUNTER — Encounter (HOSPITAL_COMMUNITY): Payer: Self-pay

## 2021-04-10 ENCOUNTER — Other Ambulatory Visit: Payer: Self-pay

## 2021-04-10 ENCOUNTER — Emergency Department (HOSPITAL_COMMUNITY): Payer: Medicare Other

## 2021-04-10 ENCOUNTER — Observation Stay (HOSPITAL_COMMUNITY)
Admission: EM | Admit: 2021-04-10 | Discharge: 2021-04-11 | Disposition: A | Discharge status: Home / Self Care | Payer: Medicare Other | Attending: Internal Medicine | Admitting: Internal Medicine

## 2021-04-10 ENCOUNTER — Observation Stay (HOSPITAL_COMMUNITY): Payer: Medicare Other

## 2021-04-10 DIAGNOSIS — Z8673 Personal history of transient ischemic attack (TIA), and cerebral infarction without residual deficits: Secondary | ICD-10-CM | POA: Insufficient documentation

## 2021-04-10 DIAGNOSIS — Z20822 Contact with and (suspected) exposure to covid-19: Secondary | ICD-10-CM | POA: Insufficient documentation

## 2021-04-10 DIAGNOSIS — Z85828 Personal history of other malignant neoplasm of skin: Secondary | ICD-10-CM | POA: Diagnosis not present

## 2021-04-10 DIAGNOSIS — I10 Essential (primary) hypertension: Secondary | ICD-10-CM

## 2021-04-10 DIAGNOSIS — G459 Transient cerebral ischemic attack, unspecified: Secondary | ICD-10-CM | POA: Diagnosis present

## 2021-04-10 DIAGNOSIS — Z7901 Long term (current) use of anticoagulants: Secondary | ICD-10-CM | POA: Diagnosis not present

## 2021-04-10 DIAGNOSIS — I480 Paroxysmal atrial fibrillation: Secondary | ICD-10-CM | POA: Diagnosis not present

## 2021-04-10 DIAGNOSIS — Z96653 Presence of artificial knee joint, bilateral: Secondary | ICD-10-CM | POA: Insufficient documentation

## 2021-04-10 DIAGNOSIS — H8110 Benign paroxysmal vertigo, unspecified ear: Secondary | ICD-10-CM | POA: Diagnosis not present

## 2021-04-10 DIAGNOSIS — I6782 Cerebral ischemia: Secondary | ICD-10-CM | POA: Diagnosis not present

## 2021-04-10 DIAGNOSIS — Z955 Presence of coronary angioplasty implant and graft: Secondary | ICD-10-CM | POA: Insufficient documentation

## 2021-04-10 DIAGNOSIS — I2510 Atherosclerotic heart disease of native coronary artery without angina pectoris: Secondary | ICD-10-CM | POA: Diagnosis not present

## 2021-04-10 DIAGNOSIS — N179 Acute kidney failure, unspecified: Secondary | ICD-10-CM

## 2021-04-10 DIAGNOSIS — I639 Cerebral infarction, unspecified: Secondary | ICD-10-CM

## 2021-04-10 DIAGNOSIS — Z79899 Other long term (current) drug therapy: Secondary | ICD-10-CM | POA: Diagnosis not present

## 2021-04-10 DIAGNOSIS — R42 Dizziness and giddiness: Secondary | ICD-10-CM | POA: Diagnosis present

## 2021-04-10 DIAGNOSIS — E871 Hypo-osmolality and hyponatremia: Secondary | ICD-10-CM

## 2021-04-10 DIAGNOSIS — Z87891 Personal history of nicotine dependence: Secondary | ICD-10-CM | POA: Insufficient documentation

## 2021-04-10 DIAGNOSIS — Y900 Blood alcohol level of less than 20 mg/100 ml: Secondary | ICD-10-CM | POA: Diagnosis not present

## 2021-04-10 DIAGNOSIS — I4891 Unspecified atrial fibrillation: Secondary | ICD-10-CM | POA: Diagnosis present

## 2021-04-10 LAB — CBC
HCT: 35.1 % — ABNORMAL LOW (ref 36.0–46.0)
Hemoglobin: 11.6 g/dL — ABNORMAL LOW (ref 12.0–15.0)
MCH: 32.1 pg (ref 26.0–34.0)
MCHC: 33 g/dL (ref 30.0–36.0)
MCV: 97.2 fL (ref 80.0–100.0)
Platelets: 147 10*3/uL — ABNORMAL LOW (ref 150–400)
RBC: 3.61 MIL/uL — ABNORMAL LOW (ref 3.87–5.11)
RDW: 13.4 % (ref 11.5–15.5)
WBC: 10.2 10*3/uL (ref 4.0–10.5)
nRBC: 0 % (ref 0.0–0.2)

## 2021-04-10 LAB — DIFFERENTIAL
Abs Immature Granulocytes: 0.04 10*3/uL (ref 0.00–0.07)
Basophils Absolute: 0 10*3/uL (ref 0.0–0.1)
Basophils Relative: 0 %
Eosinophils Absolute: 0 10*3/uL (ref 0.0–0.5)
Eosinophils Relative: 0 %
Immature Granulocytes: 0 %
Lymphocytes Relative: 12 %
Lymphs Abs: 1.3 10*3/uL (ref 0.7–4.0)
Monocytes Absolute: 0.7 10*3/uL (ref 0.1–1.0)
Monocytes Relative: 7 %
Neutro Abs: 8.2 10*3/uL — ABNORMAL HIGH (ref 1.7–7.7)
Neutrophils Relative %: 81 %

## 2021-04-10 LAB — COMPREHENSIVE METABOLIC PANEL
ALT: 12 U/L (ref 0–44)
AST: 17 U/L (ref 15–41)
Albumin: 3.2 g/dL — ABNORMAL LOW (ref 3.5–5.0)
Alkaline Phosphatase: 49 U/L (ref 38–126)
Anion gap: 10 (ref 5–15)
BUN: 38 mg/dL — ABNORMAL HIGH (ref 8–23)
CO2: 24 mmol/L (ref 22–32)
Calcium: 9.1 mg/dL (ref 8.9–10.3)
Chloride: 93 mmol/L — ABNORMAL LOW (ref 98–111)
Creatinine, Ser: 1.82 mg/dL — ABNORMAL HIGH (ref 0.44–1.00)
GFR, Estimated: 28 mL/min — ABNORMAL LOW (ref >60)
Glucose, Bld: 105 mg/dL — ABNORMAL HIGH (ref 70–99)
Potassium: 4.7 mmol/L (ref 3.5–5.1)
Sodium: 127 mmol/L — ABNORMAL LOW (ref 135–145)
Total Bilirubin: 0.8 mg/dL (ref 0.3–1.2)
Total Protein: 5.5 g/dL — ABNORMAL LOW (ref 6.5–8.1)

## 2021-04-10 LAB — URINALYSIS, ROUTINE W REFLEX MICROSCOPIC
Bacteria, UA: NONE SEEN
Bilirubin Urine: NEGATIVE
Glucose, UA: NEGATIVE mg/dL
Ketones, ur: NEGATIVE mg/dL
Nitrite: NEGATIVE
Protein, ur: NEGATIVE mg/dL
Specific Gravity, Urine: 1.01 (ref 1.005–1.030)
pH: 5 (ref 5.0–8.0)

## 2021-04-10 LAB — I-STAT CHEM 8, ED
BUN: 35 mg/dL — ABNORMAL HIGH (ref 8–23)
Calcium, Ion: 1.23 mmol/L (ref 1.15–1.40)
Chloride: 95 mmol/L — ABNORMAL LOW (ref 98–111)
Creatinine, Ser: 1.8 mg/dL — ABNORMAL HIGH (ref 0.44–1.00)
Glucose, Bld: 101 mg/dL — ABNORMAL HIGH (ref 70–99)
HCT: 35 % — ABNORMAL LOW (ref 36.0–46.0)
Hemoglobin: 11.9 g/dL — ABNORMAL LOW (ref 12.0–15.0)
Potassium: 4.6 mmol/L (ref 3.5–5.1)
Sodium: 128 mmol/L — ABNORMAL LOW (ref 135–145)
TCO2: 25 mmol/L (ref 22–32)

## 2021-04-10 LAB — CBG MONITORING, ED: Glucose-Capillary: 96 mg/dL (ref 70–99)

## 2021-04-10 LAB — ETHANOL: Alcohol, Ethyl (B): <10 mg/dL (ref <10)

## 2021-04-10 LAB — RAPID URINE DRUG SCREEN, HOSP PERFORMED
Amphetamines: NOT DETECTED
Barbiturates: POSITIVE — AB
Benzodiazepines: NOT DETECTED
Cocaine: NOT DETECTED
Opiates: NOT DETECTED
Tetrahydrocannabinol: NOT DETECTED

## 2021-04-10 LAB — PROTIME-INR
INR: 2.4 — ABNORMAL HIGH (ref 0.8–1.2)
Prothrombin Time: 25.9 seconds — ABNORMAL HIGH (ref 11.4–15.2)

## 2021-04-10 LAB — RESP PANEL BY RT-PCR (FLU A&B, COVID) ARPGX2
Influenza A by PCR: NEGATIVE
Influenza B by PCR: NEGATIVE
SARS Coronavirus 2 by RT PCR: NEGATIVE

## 2021-04-10 LAB — APTT: aPTT: 70 seconds — ABNORMAL HIGH (ref 24–36)

## 2021-04-10 IMAGING — CT CT HEAD W/O CM
4 series · 16 of 47 positions shown, 18 images · non-contrast
Comparison: None.

CLINICAL DATA: Blurred vision, unsteady gait

EXAM:
CT HEAD WITHOUT CONTRAST
TECHNIQUE: Contiguous axial images were obtained from the base of the skull
through the vertex without intravenous contrast.

[Series 3: head wo · axial · 0.42mm/px · z∈[-130,-10]mm · 7 of 32 slices shown, 9 images]
[im 4/32  brain]
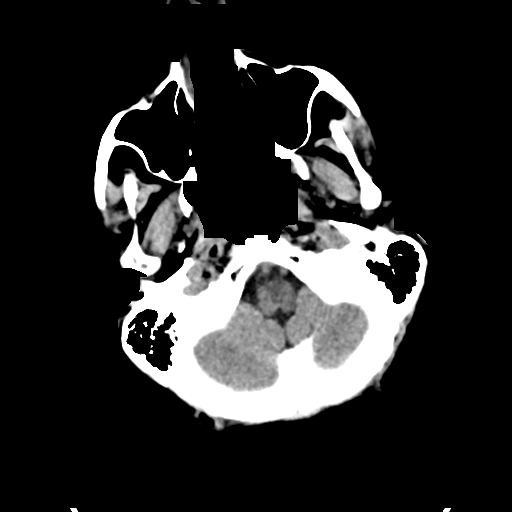
[im 4/32  bone]
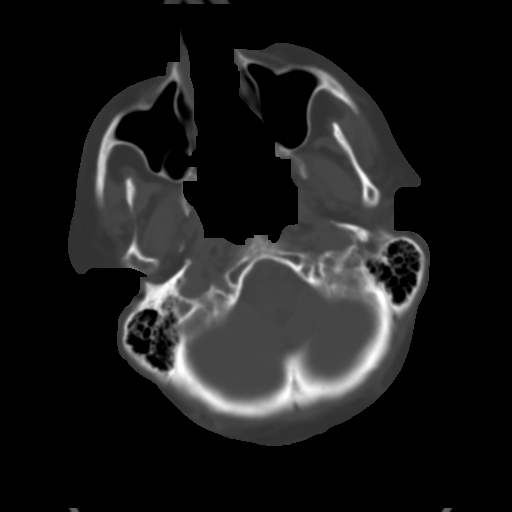
[im 8/32  brain]
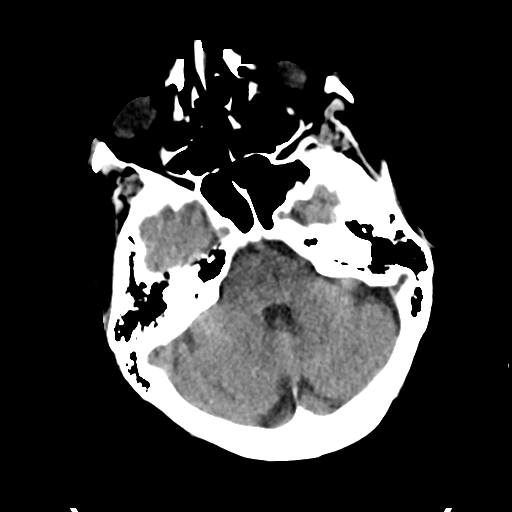
[im 12/32  brain]
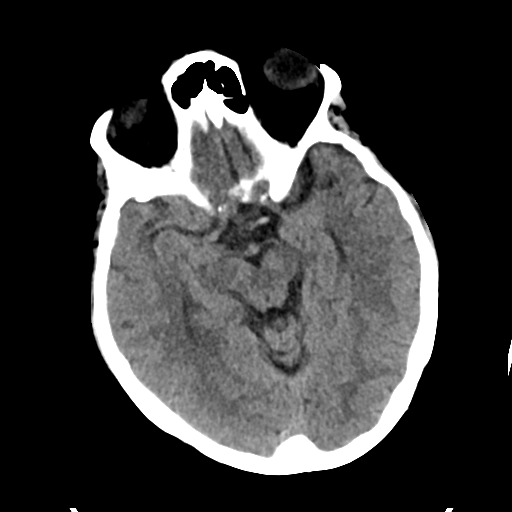
[im 16/32  brain]
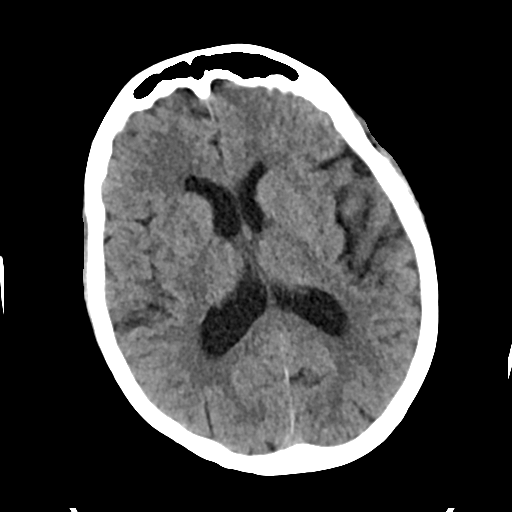
[im 20/32  brain]
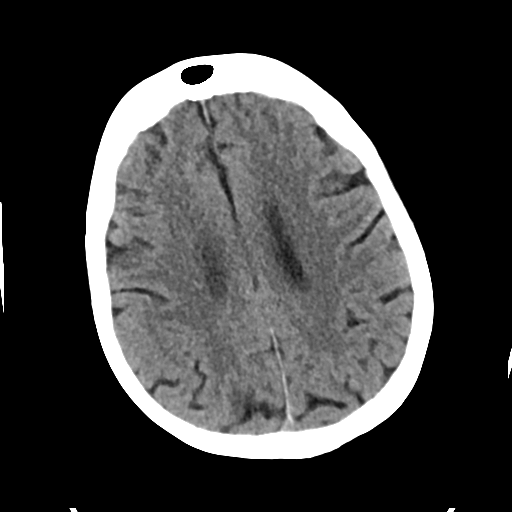
[im 20/32  bone]
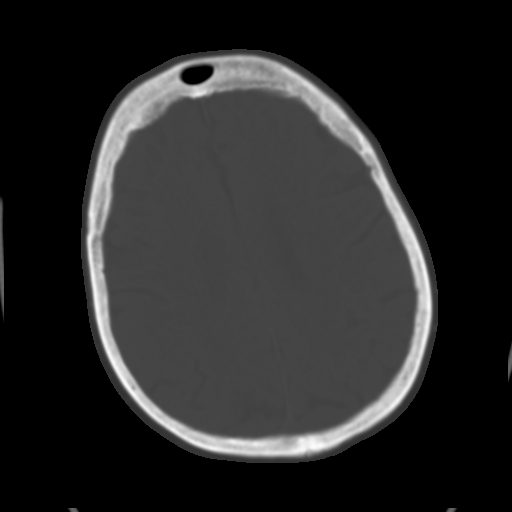
[im 24/32  brain]
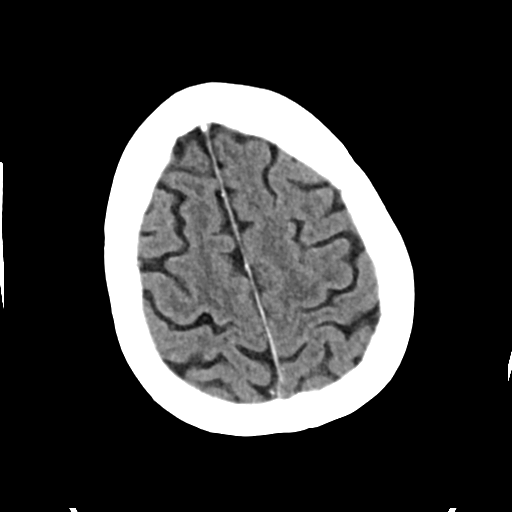
[im 28/32  brain]
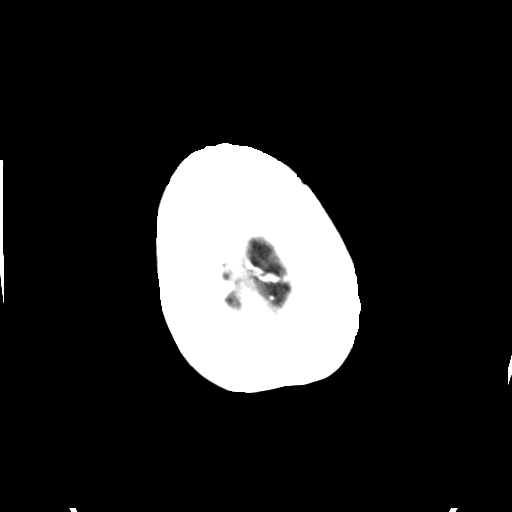

[Series 4: head bone · axial · 0.42mm/px · z∈[-131,-99]mm · 3 of 78 slices shown]
[im 8/78  bone]
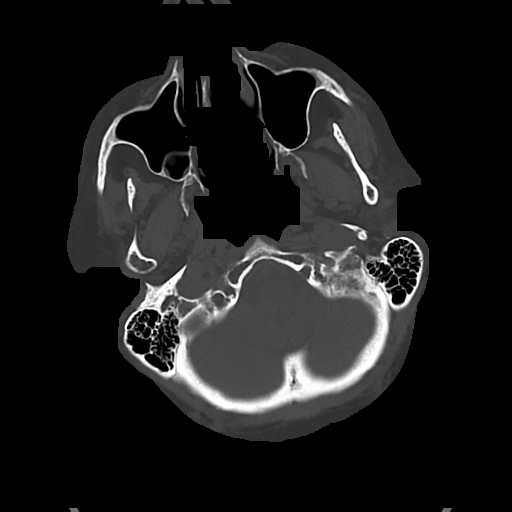
[im 16/78  bone]
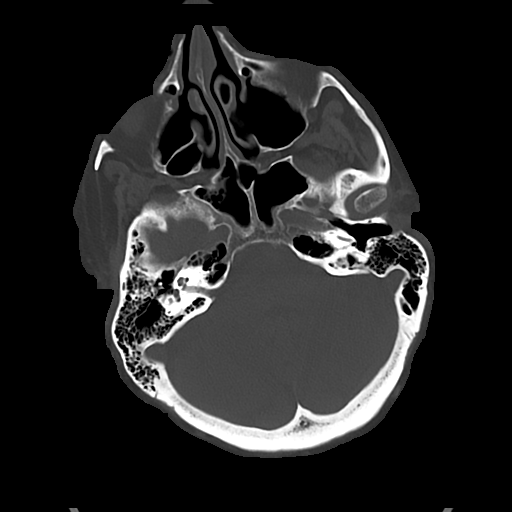
[im 24/78  bone]
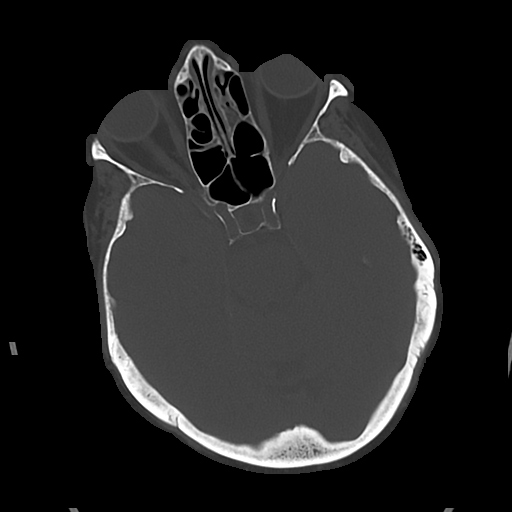

[Series 5: cor soft · coronal · 0.31mm/px · 3 of 71 slices shown]
[im 24/71  brain]
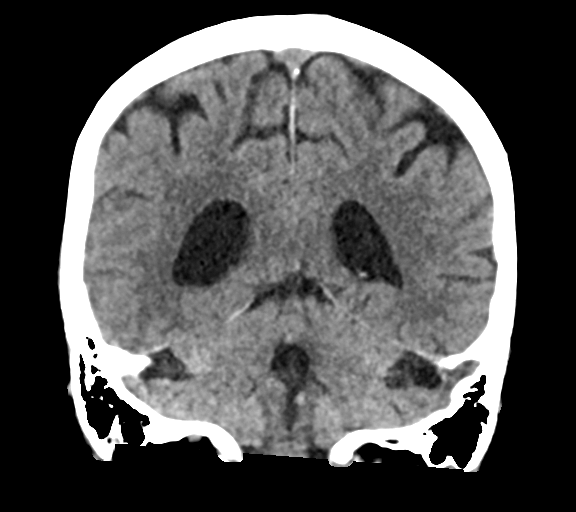
[im 32/71  brain]
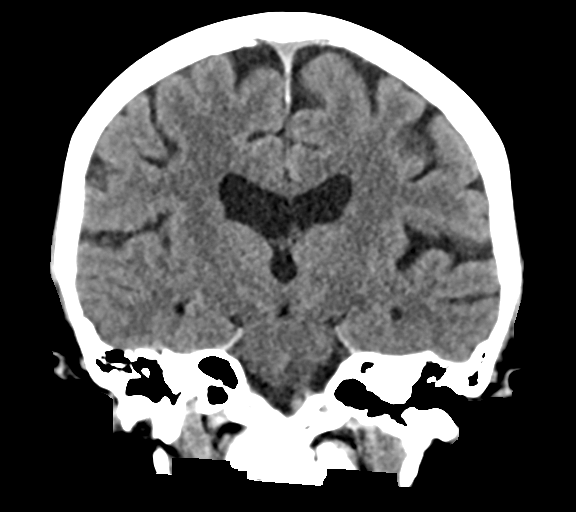
[im 39/71  brain]
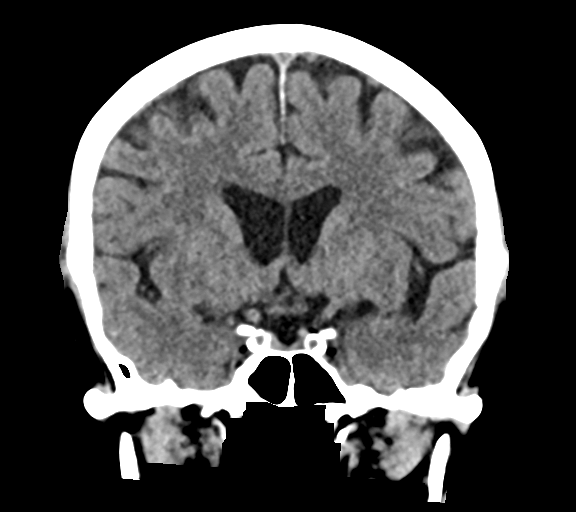

[Series 6: sag soft · sagittal · 0.31mm/px · 3 of 59 slices shown]
[im 20/59  brain]
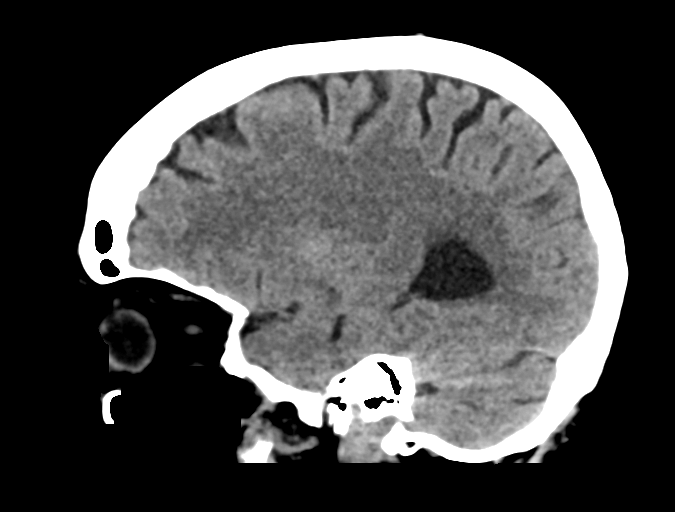
[im 30/59  brain]
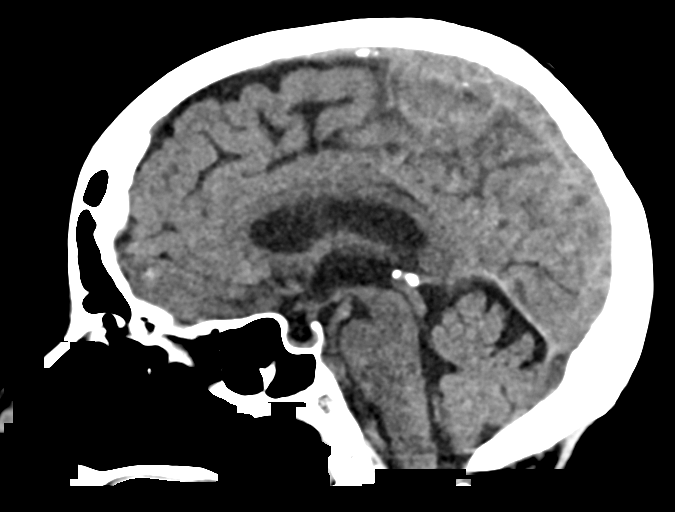
[im 39/59  brain]
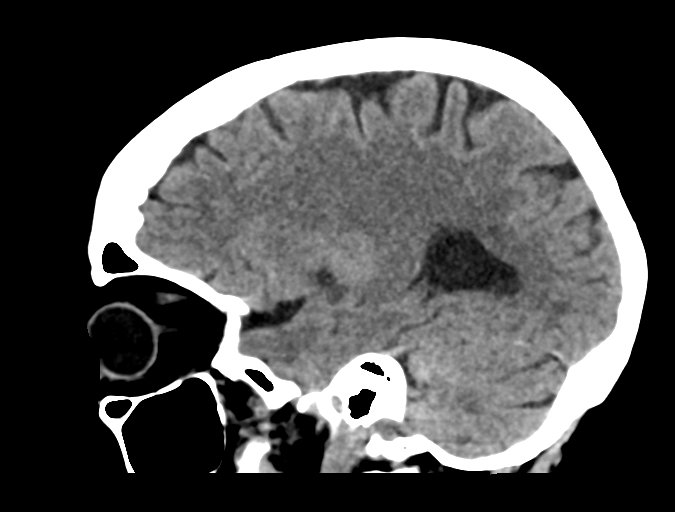

[16 of 47 positions shown; findings below may reference images not displayed]

FINDINGS: Brain: There is loss of gray-white matter differentiation in the
left frontal lobe suspicious for infarction. No evidence of acute
intracranial hemorrhage, hydrocephalus, extra-axial collection or
mass lesion/mass effect. Scattered low-density changes within the
periventricular and subcortical white matter compatible with chronic
microvascular ischemic change. Mild diffuse cerebral volume loss.

Vascular: Atherosclerotic calcifications involving the large vessels
of the skull base. No unexpected hyperdense vessel.

Skull: Normal. Negative for fracture or focal lesion.

Sinuses/Orbits: No acute finding.

Other: None.
IMPRESSION: 1. Loss of gray-white matter differentiation in the left frontal
lobe suspicious for infarction. Further evaluation with MRI of the
brain is recommended.
2. Chronic microvascular ischemic change and cerebral volume loss.

These results were called by telephone at the time of interpretation
on [DATE] at [DATE] to provider DLEKE , who verbally
acknowledged these results.

## 2021-04-10 IMAGING — MR MR HEAD W/O CM
9 of 10 series · 38 of 48 positions shown · non-contrast
Comparison: Head CT [DATE].

CLINICAL DATA: Stroke, follow-up.

EXAM:
MRI HEAD WITHOUT CONTRAST
TECHNIQUE: Multiplanar, multiecho pulse sequences of the brain and surrounding
structures were obtained without intravenous contrast.

[Series 3: DWI · axial · 3.0mm · 1.09mm/px · z∈[-74,+76]mm · 11 of 102 slices shown (1 of 4)]
[im 1/102]
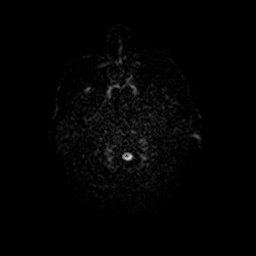
[im 11/102]
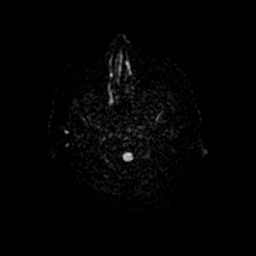
[im 21/102]
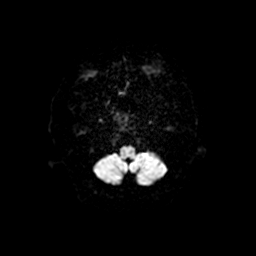
[im 31/102]
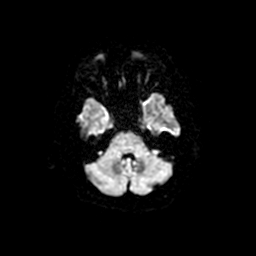
[im 41/102]
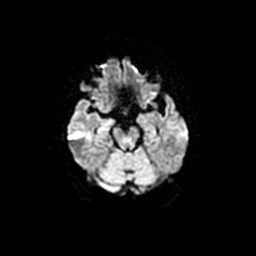
[im 51/102]
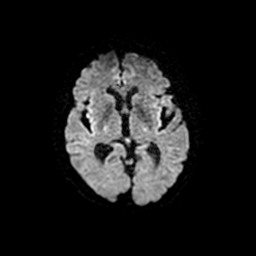
[im 61/102]
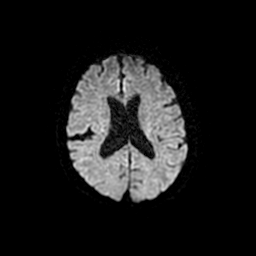
[im 71/102]
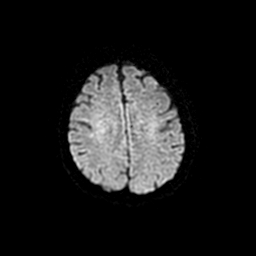
[im 81/102]
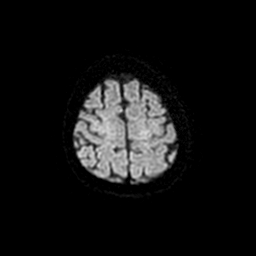
[im 91/102]
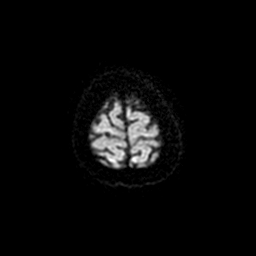
[im 102/102]
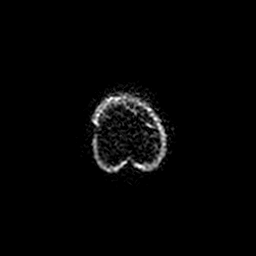

[Series 4: DWI · coronal · 5.0mm · 1.09mm/px · 8 of 74 slices shown (2 of 4)]
[im 1/74]
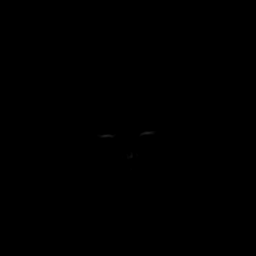
[im 11/74]
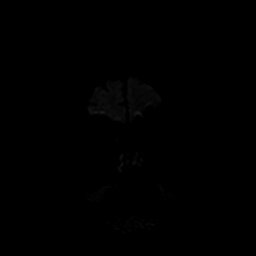
[im 21/74]
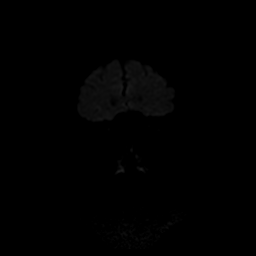
[im 32/74]
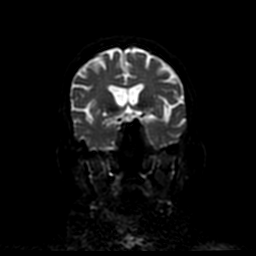
[im 42/74]
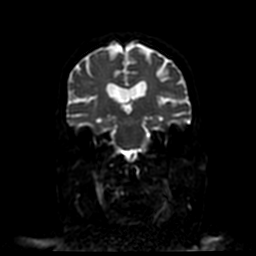
[im 53/74]
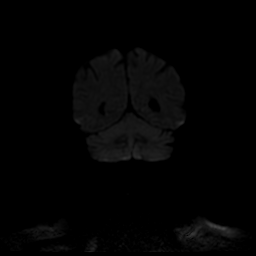
[im 63/74]
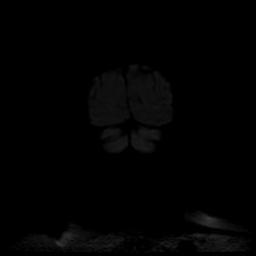
[im 74/74]
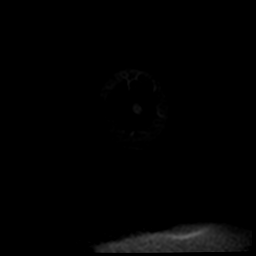

[Series 5: T1 · sagittal · 5.0mm · 0.47mm/px · 2 of 23 slices shown (1 of 2)]
[im 1/23]
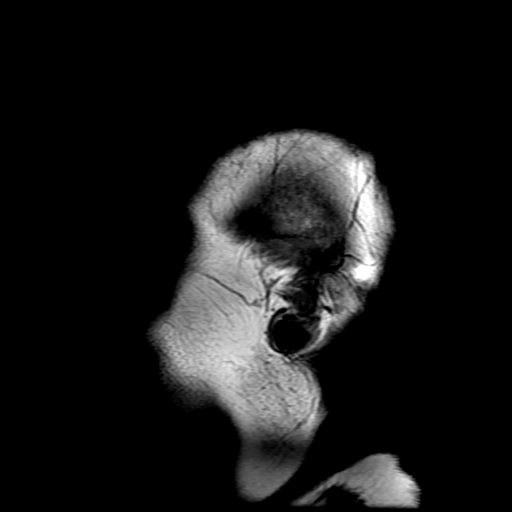
[im 23/23]
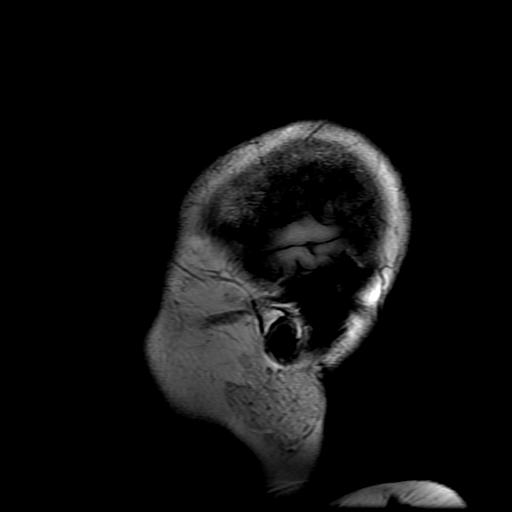

[Series 6: T2 · axial · 5.0mm · 0.43mm/px · z∈[-78,+65]mm · 2 of 25 slices shown (1 of 2)]
[im 1/25]
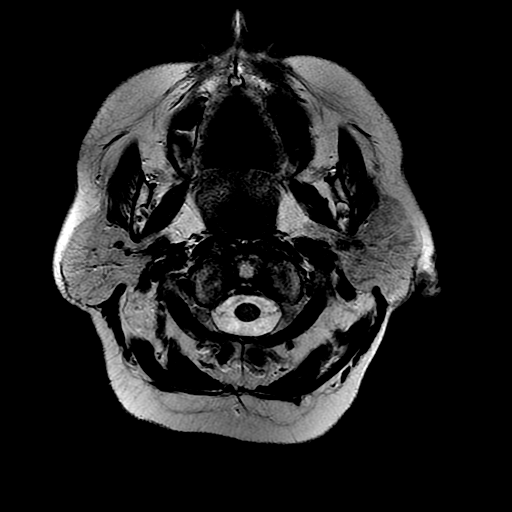
[im 25/25]
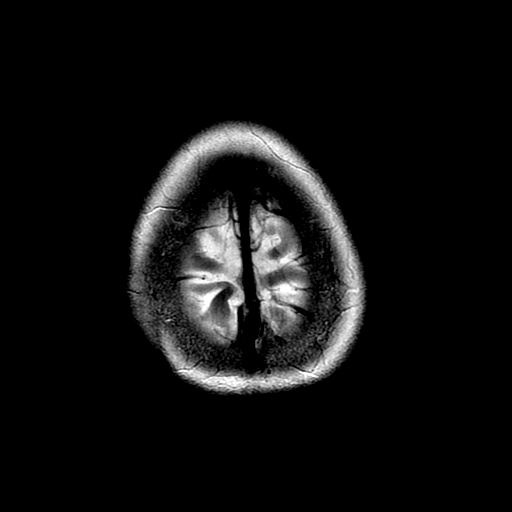

[Series 7: FLAIR · axial · 3.0mm · 0.43mm/px · z∈[-78,+65]mm · 2 of 25 slices shown]
[im 1/25]
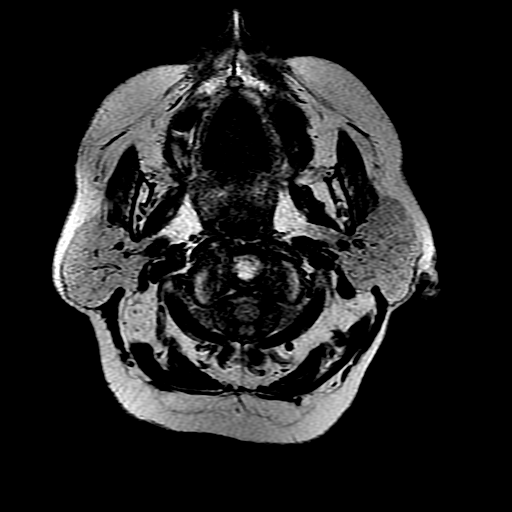
[im 25/25]
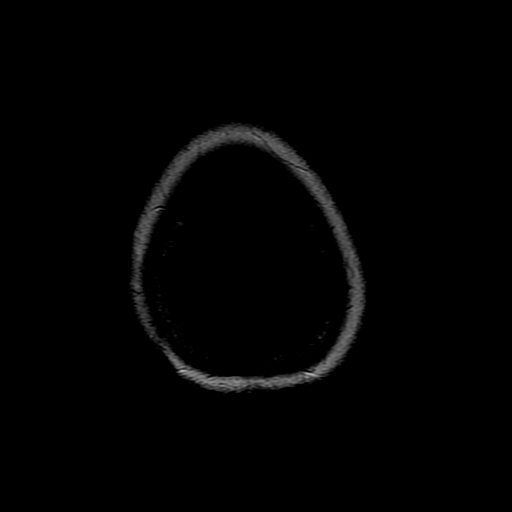

[Series 9: T1 · axial · 3.0mm · 0.43mm/px · z∈[-79,-63]mm · 2 of 100 slices shown (2 of 2)]
[im 1/100]
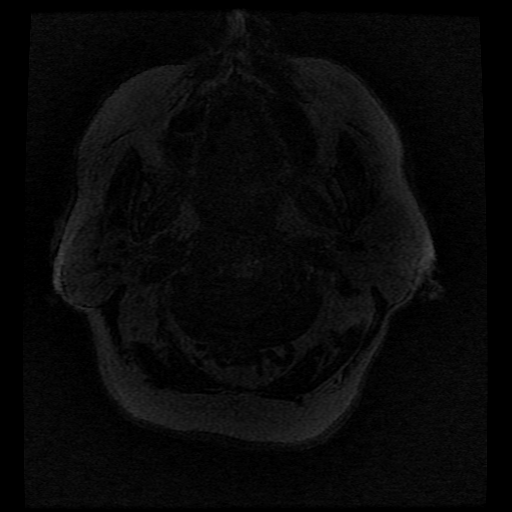
[im 12/100]
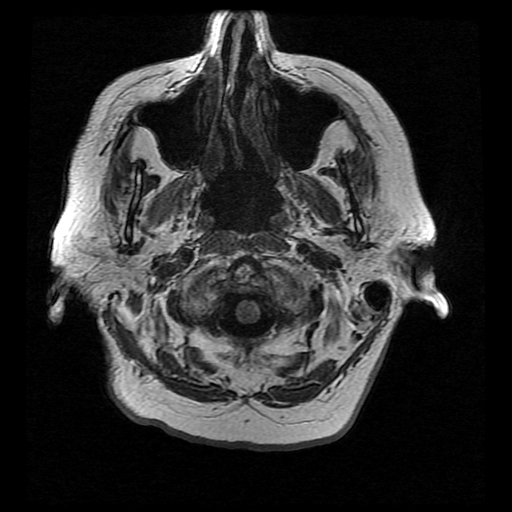

[Series 10: T2 · coronal · 5.0mm · 0.43mm/px · 2 of 24 slices shown (2 of 2)]
[im 1/24]
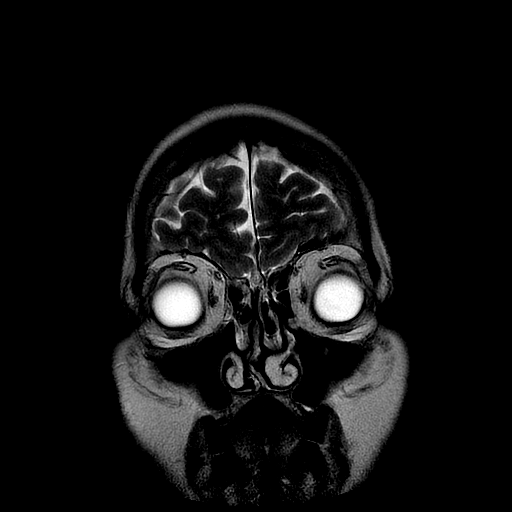
[im 24/24]
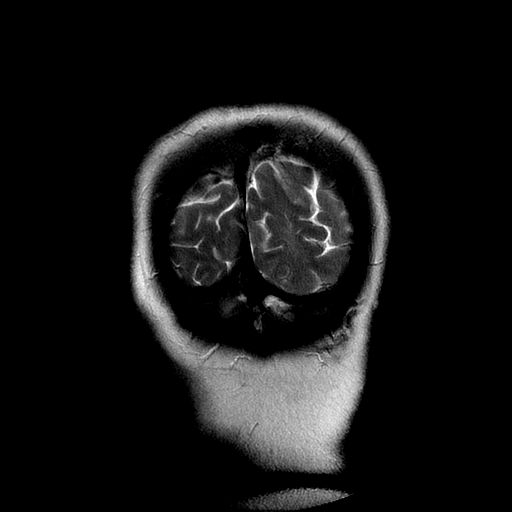

[Series 300: DWI · axial · 3.0mm · 1.09mm/px · z∈[-74,+76]mm · 5 of 51 slices shown (3 of 4)]
[im 1/51]
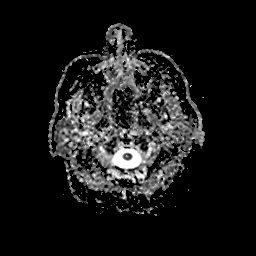
[im 13/51]
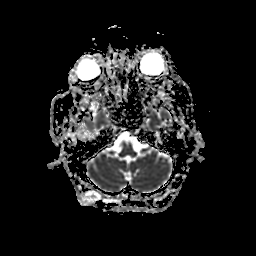
[im 26/51]
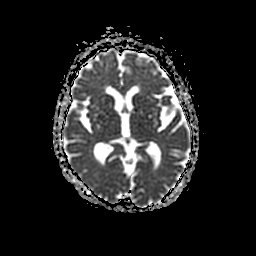
[im 38/51]
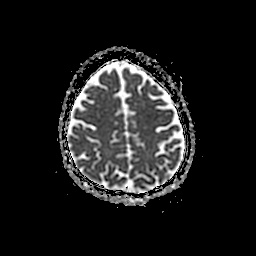
[im 51/51]
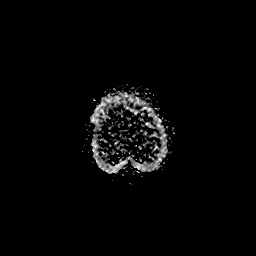

[Series 400: DWI · coronal · 5.0mm · 1.09mm/px · 4 of 37 slices shown (4 of 4)]
[im 1/37]
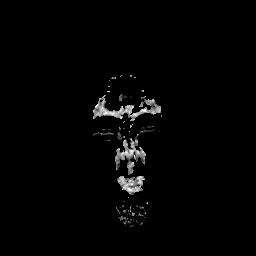
[im 13/37]
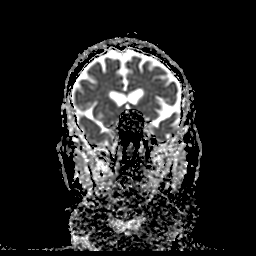
[im 25/37]
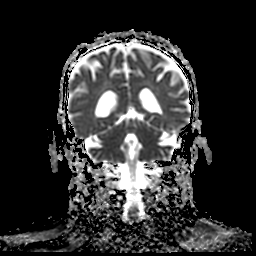
[im 37/37]
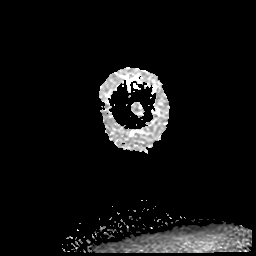

[38 of 48 positions shown; findings below may reference images not displayed]

FINDINGS: Brain:

Cerebral volume is normal for age.

Mild multifocal T2 FLAIR hyperintense signal abnormality within the
cerebral white matter and pons, nonspecific but compatible with
chronic small vessel ischemic disease.

Prominent perivascular spaces within the inferior basal ganglia
bilaterally.

There is no acute infarct.

No evidence of an intracranial mass.

No chronic intracranial blood products.

No extra-axial fluid collection.

No midline shift.

Partially empty sella turcica.

Vascular: Maintained flow voids within the proximal large arterial
vessels.

Skull and upper cervical spine: No focal suspicious marrow lesion.

Sinuses/Orbits: Visualized orbits show no acute finding. Bilateral
lens replacements. Trace mucosal thickening within the bilateral
ethmoid and sphenoid sinuses. Small-volume frothy secretions also
present within the right sphenoid sinus.
IMPRESSION: No evidence of acute intracranial abnormality.

Mild chronic small vessel ischemic changes within the cerebral white
matter and pons.

Paranasal sinus disease, as described.

## 2021-04-10 IMAGING — MR MR MRA HEAD W/O CM
1 series · 20 of 48 positions shown · non-contrast
Comparison: No pertinent prior exam.

CLINICAL DATA: Acute neurologic deficit

EXAM:
MRA HEAD WITHOUT CONTRAST
TECHNIQUE: Angiographic images of the Circle of Willis were acquired using MRA
technique without intravenous contrast.

[Series 5: 3d cow · axial · 0.5mm · 0.41mm/px · z∈[-65,+13]mm · 20 of 172 slices shown]
[im 1/172]
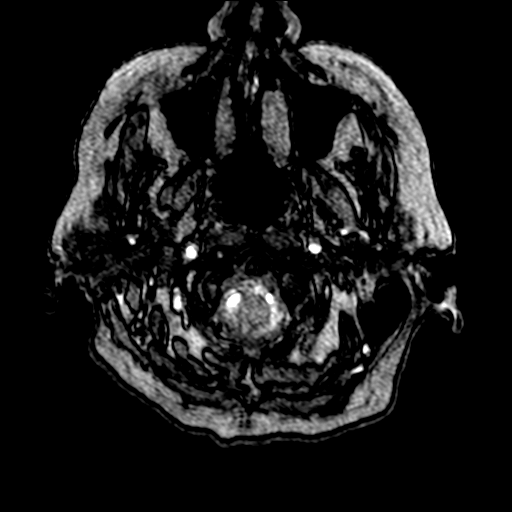
[im 4/172]
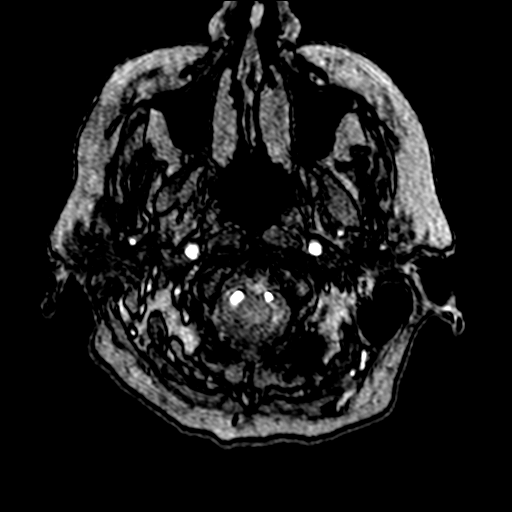
[im 8/172]
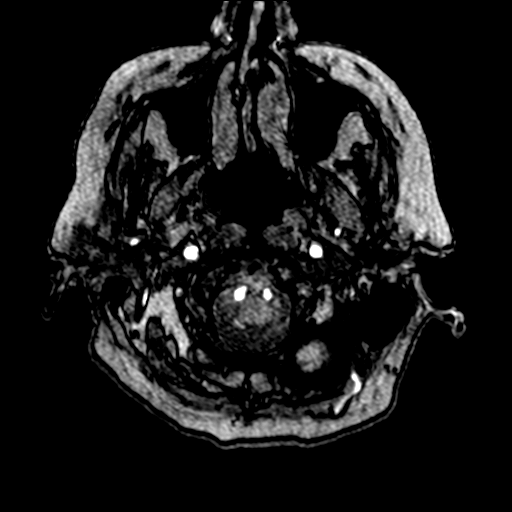
[im 11/172]
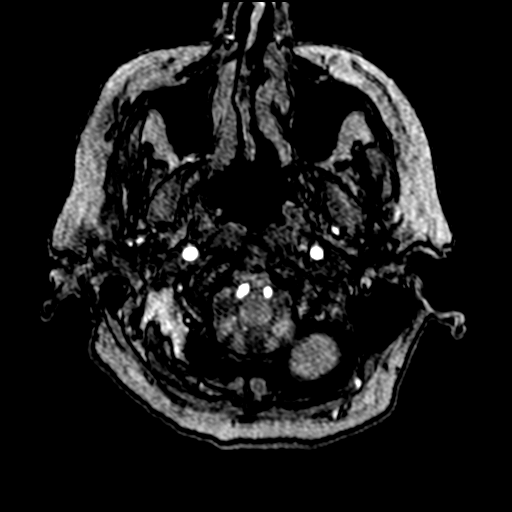
[im 15/172]
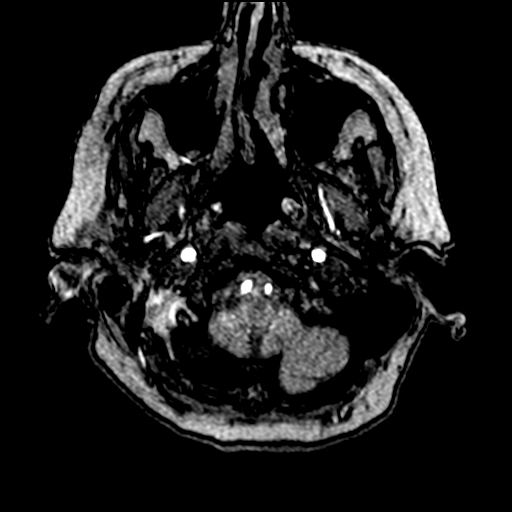
[im 19/172]
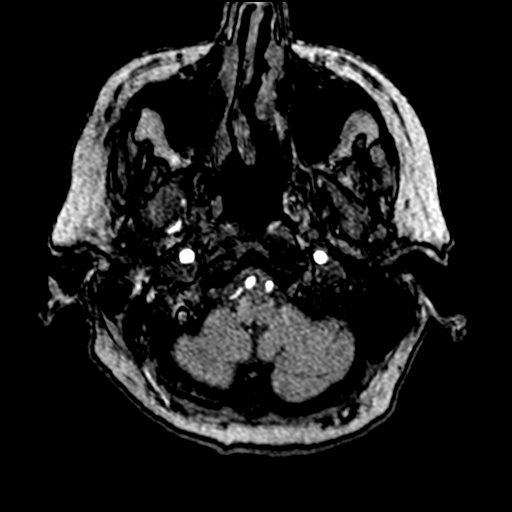
[im 22/172]
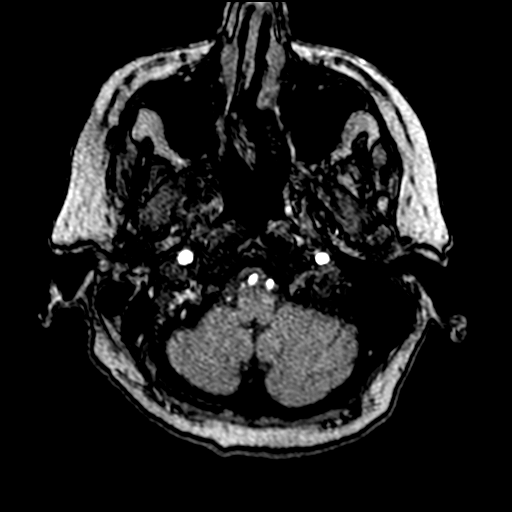
[im 26/172]
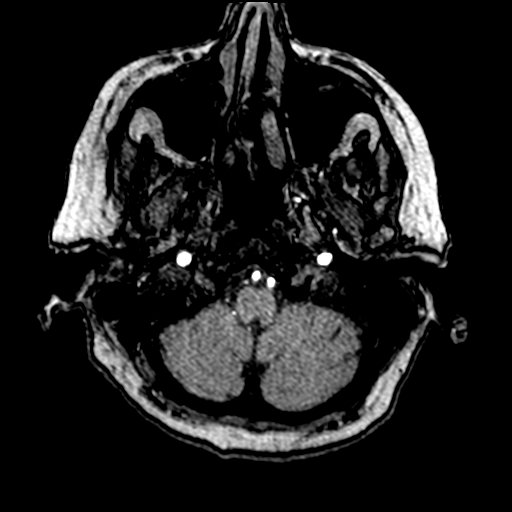
[im 30/172]
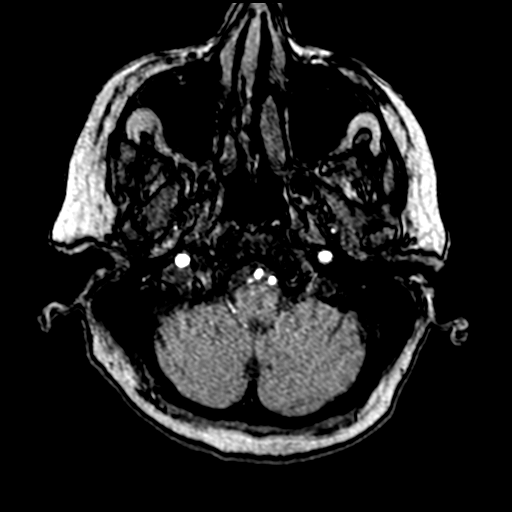
[im 33/172]
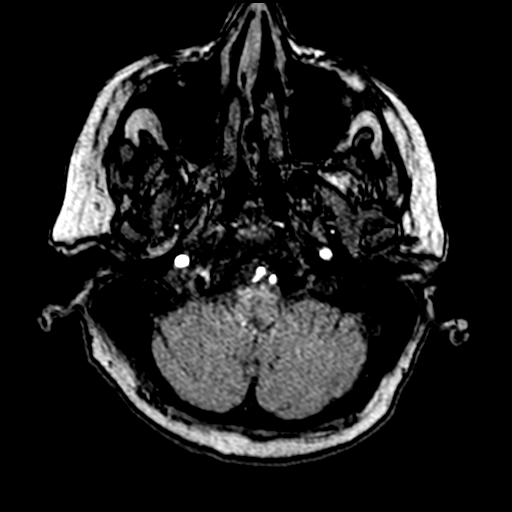
[im 37/172]
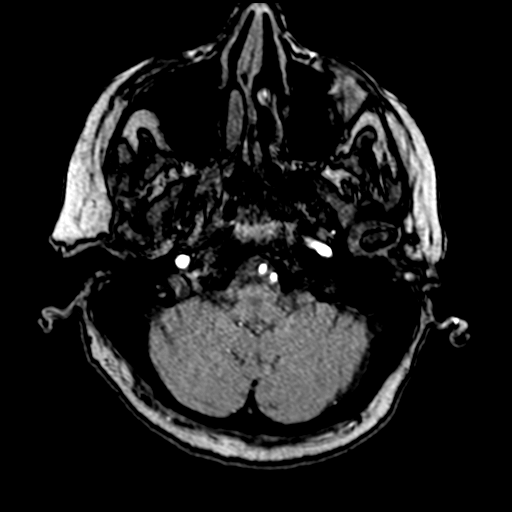
[im 41/172]
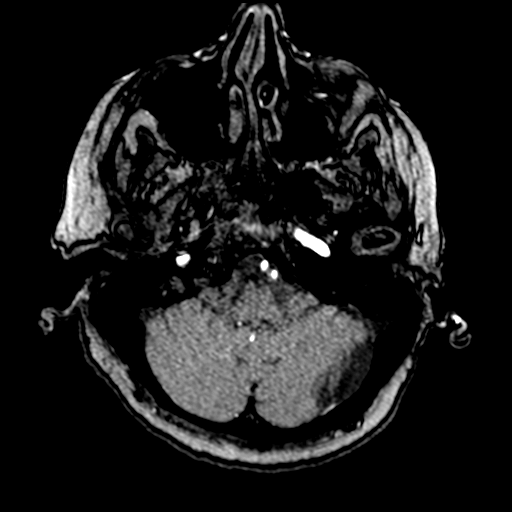
[im 55/172]
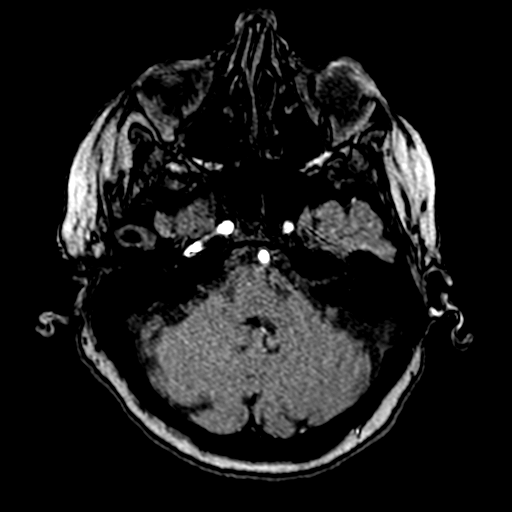
[im 77/172]
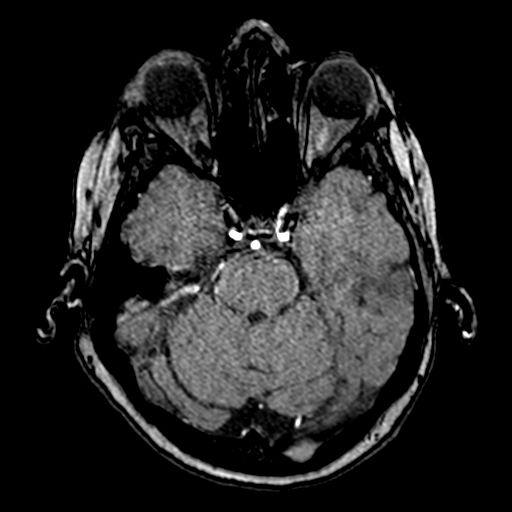
[im 88/172]
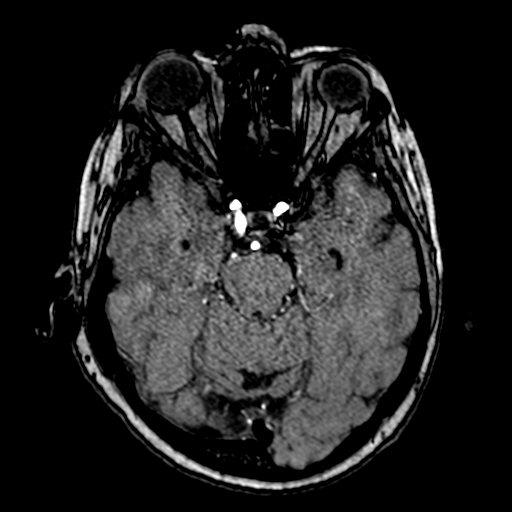
[im 99/172]
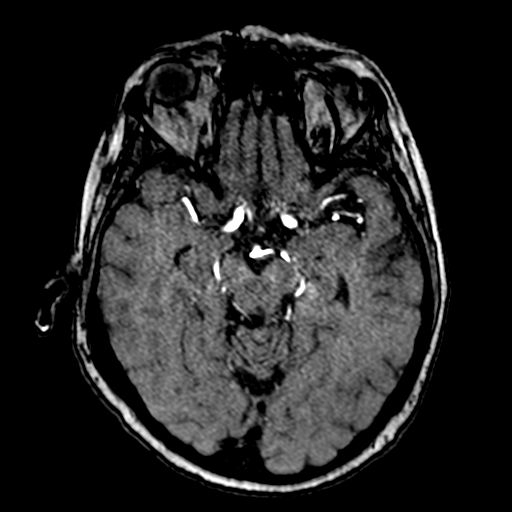
[im 121/172]
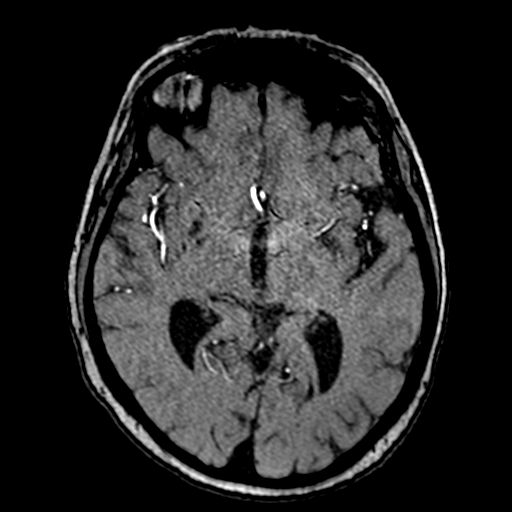
[im 142/172]
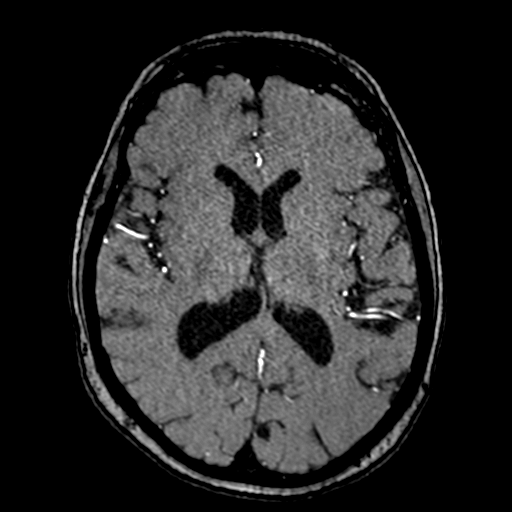
[im 146/172]
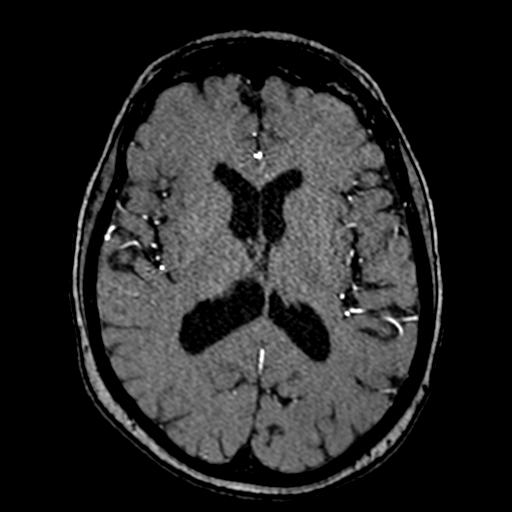
[im 164/172]
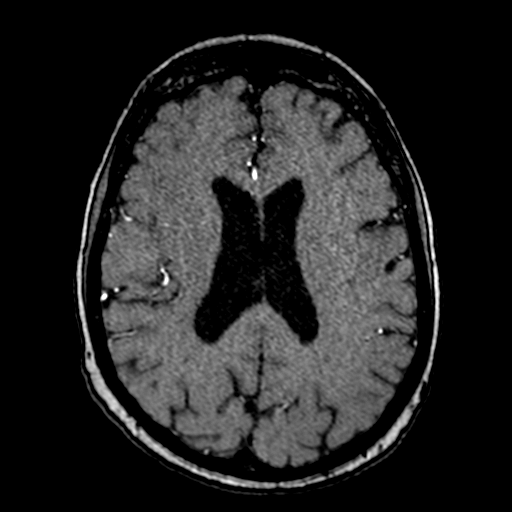

[20 of 48 positions shown; findings below may reference images not displayed]

FINDINGS: POSTERIOR CIRCULATION:

--Vertebral arteries: Normal

--Inferior cerebellar arteries: Normal.

--Basilar artery: Normal.

--Superior cerebellar arteries: Normal.

--Posterior cerebral arteries: Normal.

ANTERIOR CIRCULATION:

--Intracranial internal carotid arteries: Normal.

--Anterior cerebral arteries (ACA): Normal.

--Middle cerebral arteries (MCA): Normal.

ANATOMIC VARIANTS: None
IMPRESSION: Normal intracranial MRA.

## 2021-04-10 MED ORDER — ONDANSETRON HCL 4 MG PO TABS: 4.0000 mg | ORAL_TABLET | Freq: Four times a day (QID) | ORAL | Status: DC | PRN

## 2021-04-10 MED ORDER — LACTATED RINGERS IV BOLUS
1000.0000 mL | Freq: Once | INTRAVENOUS | Status: AC
  Administered 2021-04-10: 1000 mL via INTRAVENOUS

## 2021-04-10 MED ORDER — METOPROLOL SUCCINATE ER 25 MG PO TB24
25.0000 mg | ORAL_TABLET | Freq: Two times a day (BID) | ORAL | Status: DC
  Administered 2021-04-10 – 2021-04-11 (×2): 25 mg via ORAL
  Filled 2021-04-10 (×2): qty 1

## 2021-04-10 MED ORDER — LISINOPRIL 10 MG PO TABS
10.0000 mg | ORAL_TABLET | Freq: Every day | ORAL | Status: DC
  Administered 2021-04-11: 10 mg via ORAL
  Filled 2021-04-10: qty 1

## 2021-04-10 MED ORDER — TRAZODONE HCL 50 MG PO TABS: 150.0000 mg | ORAL_TABLET | Freq: Every evening | ORAL | Status: DC | PRN

## 2021-04-10 MED ORDER — ACETAMINOPHEN 325 MG PO TABS: 650.0000 mg | ORAL_TABLET | Freq: Four times a day (QID) | ORAL | Status: DC | PRN

## 2021-04-10 MED ORDER — ALBUTEROL SULFATE HFA 108 (90 BASE) MCG/ACT IN AERS: 1.0000 | INHALATION_SPRAY | Freq: Every day | RESPIRATORY_TRACT | Status: DC | PRN

## 2021-04-10 MED ORDER — STROKE: EARLY STAGES OF RECOVERY BOOK
Freq: Once | Status: AC
  Administered 2021-04-10: 1
  Filled 2021-04-10: qty 1

## 2021-04-10 MED ORDER — ONDANSETRON HCL 4 MG/2ML IJ SOLN: 4.0000 mg | Freq: Four times a day (QID) | INTRAMUSCULAR | Status: DC | PRN

## 2021-04-10 MED ORDER — DIPHENHYDRAMINE HCL 25 MG PO CAPS
25.0000 mg | ORAL_CAPSULE | Freq: Every day | ORAL | Status: DC
  Administered 2021-04-10: 25 mg via ORAL
  Filled 2021-04-10: qty 1

## 2021-04-10 MED ORDER — GABAPENTIN 300 MG PO CAPS
300.0000 mg | ORAL_CAPSULE | Freq: Two times a day (BID) | ORAL | Status: DC
  Administered 2021-04-10 – 2021-04-11 (×2): 300 mg via ORAL
  Filled 2021-04-10 (×2): qty 1

## 2021-04-10 MED ORDER — ISOSORBIDE MONONITRATE ER 30 MG PO TB24: 30.0000 mg | ORAL_TABLET | Freq: Every day | ORAL | Status: DC

## 2021-04-10 MED ORDER — NITROGLYCERIN 0.4 MG SL SUBL: 0.4000 mg | SUBLINGUAL_TABLET | SUBLINGUAL | Status: DC | PRN

## 2021-04-10 MED ORDER — ISOSORBIDE MONONITRATE ER 30 MG PO TB24
30.0000 mg | ORAL_TABLET | Freq: Every day | ORAL | Status: DC
  Administered 2021-04-11: 30 mg via ORAL
  Filled 2021-04-10: qty 1

## 2021-04-10 MED ORDER — DABIGATRAN ETEXILATE MESYLATE 150 MG PO CAPS
150.0000 mg | ORAL_CAPSULE | Freq: Two times a day (BID) | ORAL | Status: DC
  Administered 2021-04-10 – 2021-04-11 (×2): 150 mg via ORAL
  Filled 2021-04-10 (×3): qty 1

## 2021-04-10 MED ORDER — ROSUVASTATIN CALCIUM 20 MG PO TABS
40.0000 mg | ORAL_TABLET | Freq: Every day | ORAL | Status: DC
  Administered 2021-04-11: 40 mg via ORAL
  Filled 2021-04-10: qty 2

## 2021-04-10 MED ORDER — PRIMIDONE 50 MG PO TABS
100.0000 mg | ORAL_TABLET | Freq: Every day | ORAL | Status: DC
  Administered 2021-04-10: 100 mg via ORAL
  Filled 2021-04-10 (×2): qty 2

## 2021-04-10 MED ORDER — LISINOPRIL 10 MG PO TABS: 10.0000 mg | ORAL_TABLET | Freq: Every day | ORAL | Status: DC

## 2021-04-10 MED ORDER — SODIUM CHLORIDE 0.9 % IV SOLN: INTRAVENOUS | Status: DC

## 2021-04-10 MED ORDER — ALBUTEROL SULFATE (2.5 MG/3ML) 0.083% IN NEBU: 2.5000 mg | INHALATION_SOLUTION | Freq: Four times a day (QID) | RESPIRATORY_TRACT | Status: DC | PRN

## 2021-04-10 MED ORDER — PANTOPRAZOLE SODIUM 40 MG PO TBEC
40.0000 mg | DELAYED_RELEASE_TABLET | Freq: Two times a day (BID) | ORAL | Status: DC
  Administered 2021-04-10 – 2021-04-11 (×2): 40 mg via ORAL
  Filled 2021-04-10 (×2): qty 1

## 2021-04-10 MED ORDER — ROSUVASTATIN CALCIUM 20 MG PO TABS: 40.0000 mg | ORAL_TABLET | Freq: Every day | ORAL | Status: DC

## 2021-04-10 MED ORDER — ACETAMINOPHEN 650 MG RE SUPP: 650.0000 mg | Freq: Four times a day (QID) | RECTAL | Status: DC | PRN

## 2021-04-10 NOTE — ED Triage Notes (Signed)
Patient complains of not feeling well x 2 days with bilateral leg swelling. This am awoke with lean to right, LKW 11pm. Grips equal, speech clear. Small mouth droop noted. Denies headache.

## 2021-04-10 NOTE — Consult Note (Signed)
Neurology Consult H&P  RACHNA SCHONBERGER MR# [id number] 04/10/2021  CC: feeling off balance  History is obtained from: patient, husband and chart.  HPI: [name] is a 75 y.o. female PMHx spinal stenosis, HTN, CAD, paroxysmal A. Fib on dabigatran with unsteadiness/inability to walk which began yesterday morning 04/09/2021. She woke feeling dizzy which is described as seeing her environment spinning provoked by movement and alleviated by closing her eyes and resting. She was in church and felt dizzy falling to the right with nausea and vomiting.  The first episode lasted about 20 minutes and each subsequent episode was shorter in duration. She does describe nausea with only one episode of vomiting.  She has had no recent medication changes and has been compliant with her medications.  No cough, shortness of breath, chest pain, abdominal pain, diarrhea.  No headache or neck pain.  No prior symptoms that have been similar.  ROS: A complete ROS was performed and is negative except as noted in the HPI.  Past Medical History:  Diagnosis Date   Anticoagulated    Pradaxa   Bilateral leg pain    Right leg has greater pain than the left.   Coronary artery disease cardiologist-- dr Rayann Heman   a.  s/p Xience DES to RCA 04/2009;   b. TEE 2/12: EF 40%, Large PFO;  c.  Lexiscan Myoview 05/2012: EF 69%, no ischemia. LHC (05/2012):  Ostial diagonal 30-40%, proximal mid ramus intermedius 40-50%, RCA stent patent with 40-50% after stent, then 40%, distal RCA 40-50%, EF 55-65%. Medical therapy continued.;  d.  Carlton Adam Myoview (06/2013):  No ischemia, EF 83%, normal study   Full dentures    GERD (gastroesophageal reflux disease)    History of basal cell carcinoma excision    scalp   History of hiatal hernia    History of kidney stones 51 years ago   x1   History of recurrent UTIs    Hypertension    IFG (impaired fasting glucose)    MVP (mitral valve prolapse)    mild per last echo 04-03-2017 in  epic   OA (osteoarthritis)    knees , fingers   PAF (paroxysmal atrial fibrillation) [facility])    cardiologist-- dr Rayann Heman   S/P ablation of atrial fibrillation 08/15/2010   S/P drug eluting coronary stent placement 04/04/2009   Spinal stenosis    Tremor    Wears glasses      Family History  Problem Relation Age of Onset   Coronary artery disease Father    Heart disease Mother    Heart disease Sister    Heart disease Brother     Social History:  reports that she quit smoking about 32 years ago. Her smoking use included cigarettes. She has a 10.00 pack-year smoking history. She has never used smokeless tobacco. She reports that she does not drink alcohol and does not use drugs.   Prior to Admission medications   Medication Sig Start Date End Date Taking? Authorizing Provider  albuterol (VENTOLIN HFA) 108 (90 Base) MCG/ACT inhaler Inhale 1 puff into the lungs daily as needed. 03/03/21  Yes [provider]  diphenhydrAMINE (BENADRYL) 25 mg capsule Take 25 mg by mouth at bedtime.   Yes [provider]  gabapentin (NEURONTIN) 300 MG capsule Take 300 mg by mouth in the morning and at bedtime. 03/28/21  Yes [provider]  hydrochlorothiazide (HYDRODIURIL) 12.5 MG tablet Take 1 tablet (12.5 mg total) by mouth daily. 03/28/17  Yes Thompson Grayer, MD  isosorbide mononitrate (IMDUR) 30 MG 24 hr tablet Take 30 mg by mouth daily.    Yes [provider]  lisinopril (PRINIVIL,ZESTRIL) 10 MG tablet Take 1 tablet (10 mg total) by mouth daily. 08/27/17  Yes Allred, Jeneen Rinks, MD  meloxicam (MOBIC) 15 MG tablet Take 15 mg by mouth in the morning and at bedtime. 07/08/20  Yes [provider]  metoprolol succinate (TOPROL-XL) 25 MG 24 hr tablet TAKE 1 TABLET TWICE A DAY Patient taking differently: Take 25 mg by mouth daily. 05/23/20  Yes Allred, Jeneen Rinks, MD  nitroGLYCERIN (NITROSTAT) 0.4 MG SL tablet DISSOLVE 1 TABLET UNDER THE TONGUE EVERY 5 MINUTES AS NEEDED FOR CHEST  PAIN Patient taking differently: Place 0.4 mg under the tongue every 5 (five) minutes as needed for chest pain. 07/06/19  Yes Josue Hector, MD  pantoprazole (PROTONIX) 40 MG tablet Take 1 tablet (40 mg total) by mouth 2 (two) times daily. NEEDS VISIT FOR FURTHER REFILLS 01/19/20  Yes Pyrtle, Lajuan Lines, MD  PRADAXA 150 MG CAPS capsule TAKE 1 CAPSULE TWICE A DAY Patient taking differently: Take 150 mg by mouth 2 (two) times daily. 10/14/20  Yes Allred, Jeneen Rinks, MD  primidone (MYSOLINE) 50 MG tablet Take 2 tablets (100 mg total) by mouth at bedtime. 09/07/19  Yes Ward Givens, NP  rosuvastatin (CRESTOR) 40 MG tablet TAKE 1 TABLET DAILY Patient taking differently: Take 40 mg by mouth daily. 03/21/21  Yes Allred, Jeneen Rinks, MD  traZODone (DESYREL) 50 MG tablet Take 150 mg by mouth at bedtime as needed. 03/14/21  Yes [provider]  ondansetron (ZOFRAN) 4 MG tablet Take 1 tablet (4 mg total) by mouth every 8 (eight) hours as needed for nausea or vomiting. Patient not taking: No sig reported 05/12/20   Melina Schools, MD    Exam: Current vital signs: BP (!) 102/51   Pulse 62   Temp 98.8 F (37.1 C) (Oral)   Resp 16   SpO2 99%   Physical Exam  Constitutional: Appears well-developed and well-nourished.  Psych: Affect appropriate to situation Eyes: No scleral injection HENT: No OP obstruction. Head: Normocephalic.  Cardiovascular: Normal rate and regular rhythm.  Respiratory: Effort normal, symmetric excursions bilaterally, no audible wheezing. GI: Soft.  No distension. There is no tenderness.  Skin: WDI  Neuro: Mental Status: Patient is awake, alert, oriented to person, place, month, year, and situation. Patient is able to give a clear and coherent history. Speech fluent, intact comprehension and repetition. No signs of aphasia or neglect. Visual Fields are full. Pupils are equal, round, and reactive to light. EOMI without ptosis or diploplia.  Facial sensation is symmetric to  temperature Facial movement is symmetric.  Hearing is intact to voice. Uvula midline and palate elevates symmetrically. Shoulder shrug is symmetric. Tongue is midline without atrophy or fasciculations.  Tone is normal. Bulk is normal. 5/5 strength was present in all four extremities. Sensation is symmetric to light touch and temperature in the arms and legs. Deep Tendon Reflexes: 2+ and symmetric in the biceps and patellae. Toes are downgoing bilaterally. FNF and HKS are intact bilaterally. Gait - Deferred  I have reviewed labs in epic and the pertinent results are: Na 127  I have reviewed the images obtained: MRI brain showed no acute intracranial abnormality, mild chronic small vessel ischemic changes within the cerebral white matter and pons.  Assessment: ODESSA NISHI is a 75 y.o. female PMHx as noted, PAF on dabigatran with progressively shorter episodes of vertigo provoked by movement and  alleviated by closing her eyes and resting. She was not actively experiencing dizziness and there were no brainstem or cerebellar signs. Description suggests peripheral disorder however she does have risk vascular factors but is on dabigatran. MRI did not reveal acute ischemic changes and recommend vascular imaging.   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 HTN HLD Hyponatremia  Plan: - CTA head and neck to rule out vascular stenoses/occlusion.  - Continue dabigatran. - Please call neurology if CTA is abnormal.  Electronically signed by:  Lynnae Sandhoff, MD Page: [phone] 04/10/2021, 3:51 PM

## 2021-04-10 NOTE — ED Provider Notes (Addendum)
Emergency Medicine Provider Triage Evaluation Note  [name] , a 75 y.o. female  was evaluated in triage.  Pt complains of feeling off balanced and leaning towards right sided that she noticed this AM upon waking up. LKN 11 PM last night. Also complains of generalized weakness and fatigue.   Additional information obtained by husband.  Reports that probably for the past week she has and had increased fatigue.  He has had to help her with normal activities of daily living which she typically does not need help with.  She has been complaining of some abdominal pain however no vomiting or diarrhea.  Review of Systems  Positive: + leaning towards right side, off balance, weakness Negative: -  Physical Exam  BP 110/82 (BP Location: Left Arm)   Pulse 74   Temp 98.8 F (37.1 C) (Oral)   Resp 18   SpO2 99%  Gen:   Awake, no distress   Resp:  Normal effort  MSK:   Moves extremities without difficulty  Other:  Right sided facial droop. Leaning towards right side. No pronator drift. CN intact.   Medical Decision Making  Medically screening exam initiated at 10:27 AM.  Appropriate orders placed.  [name] was informed that the remainder of the evaluation will be completed by another provider, this initial triage assessment does not replace that evaluation, and the importance of remaining in the ED until their evaluation is complete.  Concern for stroke. Out of window for tPA.        Eustaquio Maize, PA-C 04/10/21 Peterman, [place], DO 04/11/21 [phone]

## 2021-04-10 NOTE — ED Notes (Signed)
Patient transported to MRI 

## 2021-04-10 NOTE — ED Provider Notes (Signed)
[facility] EMERGENCY DEPARTMENT Provider Note   CSN: [id number] Arrival date & time: 04/10/21  1019     History No chief complaint on file.   [name] is a 75 y.o. female.  Patient is a 75 year old female with a history of hypertension, CAD anticoagulated with Pradaxa, spinal stenosis, paroxysmal A. Fib who is presenting today with complaints of inability to walk.  Patient was in her normal state of health on Saturday and then Sunday morning around 9:00 while she was at church she started not feeling well.  She reported she felt dizzy and every time she tries to walk she falls to the right.  She feels like her head is being pulled to the right and her body.  Her husband has to help her even to get up and walk forward so that she does not fall.  She had only had 1 episode of vomiting while at church and denies any further nausea but she does feel dizzy when she stands and attempts to walk.  She has had no recent medication changes and has been compliant with her medications.  No cough, shortness of breath, chest pain, abdominal pain, diarrhea.  No headache or neck pain.  No prior symptoms that have been similar.  The history is provided by the patient and the spouse.      Past Medical History:  Diagnosis Date   Anticoagulated    Pradaxa   Bilateral leg pain    Right leg has greater pain than the left.   Coronary artery disease cardiologist-- dr Rayann Heman   a.  s/p Xience DES to RCA 04/2009;   b. TEE 2/12: EF 40%, Large PFO;  c.  Lexiscan Myoview 05/2012: EF 69%, no ischemia. LHC (05/2012):  Ostial diagonal 30-40%, proximal mid ramus intermedius 40-50%, RCA stent patent with 40-50% after stent, then 40%, distal RCA 40-50%, EF 55-65%. Medical therapy continued.;  d.  Carlton Adam Myoview (06/2013):  No ischemia, EF 83%, normal study   Full dentures    GERD (gastroesophageal reflux disease)    History of basal cell carcinoma excision    scalp   History of hiatal hernia     History of kidney stones 51 years ago   x1   History of recurrent UTIs    Hypertension    IFG (impaired fasting glucose)    MVP (mitral valve prolapse)    mild per last echo 04-03-2017 in epic   OA (osteoarthritis)    knees , fingers   PAF (paroxysmal atrial fibrillation) [facility])    cardiologist-- dr Rayann Heman   S/P ablation of atrial fibrillation 08/15/2010   S/P drug eluting coronary stent placement 04/04/2009   Spinal stenosis    Tremor    Wears glasses     Patient Active Problem List   Diagnosis Date Noted   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   S/P lumbar fusion 05/11/2020   Back pain 04/05/2020   Fracture of metatarsal, closed 04/05/2020   GERD (gastroesophageal reflux disease) 04/05/2020   Insomnia 04/05/2020   Lumbar post-laminectomy syndrome 02/26/2020   Degenerative lumbar spinal stenosis 12/21/2019   Degenerative spondylolisthesis 12/21/2019   RUQ abdominal pain 05/06/2019   Chronic anticoagulation 05/06/2019   Screening for viral disease 05/06/2019   Lumbar radiculopathy 05/21/2018   S/P left TKA 12/20/2015   Obese 09/08/2014   S/P right TKA 09/07/2014   History of total knee replacement, left 09/07/2014   Congestive heart failure ([place]) 07/15/2013   Diarrhea 07/15/2012   Incontinence  of feces 07/15/2012   Precordial pain 10/19/2010   EDEMA 12/16/2009   Atrial fibrillation ([place]) 08/01/2009   Disease of lung 04/28/2009   Elevated fasting glucose 04/27/2009   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   Personal history of malignant neoplasm of skin 04/27/2009   Hypertriglyceridemia 04/11/2009   CAD, NATIVE VESSEL 04/11/2009   DYSPNEA 04/11/2009   HEARTBURN 04/11/2009   Essential hypertension 04/01/2009   Unstable angina ([place]) 04/01/2009    Past Surgical History:  Procedure Laterality Date   ABDOMINAL EXPOSURE N/A 05/11/2020   Procedure: ABDOMINAL EXPOSURE;  Surgeon: Rosetta Posner, MD;  Location: [facility];  Service: Vascular;  Laterality: N/A;    ANAL RECTAL MANOMETRY N/A 01/11/2014   Procedure: ANAL RECTAL MANOMETRY;  Surgeon: Leighton Ruff, MD;  Location: [facility];  Service: Endoscopy;  Laterality: N/A;   APPENDECTOMY  1973   BACK SURGERY     laminectomy times 2; 2000 and 2001   BUNIONECTOMY Bilateral 2013   CARDIAC CATHETERIZATION  10-24-2010   dr allred   singl-vessel CAD with patent stent mRCA/  moderate disease mRCA beyond stent segment/  normal lvsf   CARDIAC ELECTROPHYSIOLOGY MAPPING AND ABLATION  08-15-2010  dr allred   CARDIOVASCULAR STRESS TEST  06-15-2013  dr allred   normal perfusion study/  no ischemia/  ef 83%   CARPAL TUNNEL RELEASE Right 25 years ago   CATARACT EXTRACTION W/ INTRAOCULAR LENS  IMPLANT, BILATERAL     CHOLECYSTECTOMY OPEN  1973   W/  APPENDECTOMY   CORONARY ANGIOPLASTY WITH STENT PLACEMENT  04-04-2009  dr Darnell Level brodie   PCI and DES x1 to  mRCA/  mLAD 40%, pCX 30%, pRCA 30%/  normal LVF   FLEXIBLE SIGMOIDOSCOPY N/A 07/07/2013   Procedure: FLEXIBLE SIGMOIDOSCOPY;  Surgeon: Winfield Cunas., MD;  Location: [facility];  Service: Endoscopy;  Laterality: N/A;  unprepped   LEFT HEART CATH AND CORONARY ANGIOGRAPHY N/A 10/26/2019   Procedure: LEFT HEART CATH AND CORONARY ANGIOGRAPHY;  Surgeon: Martinique, Peter M, MD;  Location: [facility];  Service: Cardiovascular;  Laterality: N/A;   LEFT HEART CATHETERIZATION WITH CORONARY ANGIOGRAM N/A 05/09/2012   Procedure: LEFT HEART CATHETERIZATION WITH CORONARY ANGIOGRAM;  Surgeon: Hillary Bow, MD;  Location: [facility] CATH LAB;  Service: Cardiovascular;  Laterality: N/A;   MOHS SURGERY  2009   scalp   PILONIDAL CYST EXCISION  2009;   2000;   [place] N/A 01/11/2014   Procedure: RECTAL ULTRASOUND;  Surgeon: Leighton Ruff, MD;  Location: [facility];  Service: Endoscopy;  Laterality: N/A;   TOTAL KNEE ARTHROPLASTY Right 09/07/2014   Procedure: RIGHT TOTAL KNEE ARTHROPLASTY;  Surgeon: Paralee Cancel, MD;  Location: [facility];  Service: Orthopedics;   Laterality: Right;   TOTAL KNEE ARTHROPLASTY Left 12/20/2015   Procedure: LEFT TOTAL KNEE ARTHROPLASTY;  Surgeon: Paralee Cancel, MD;  Location: [facility];  Service: Orthopedics;  Laterality: Left;   TRANSTHORACIC ECHOCARDIOGRAM  04-03-2017   dr allred   Leory Plowman 55-60%/  mild MVP anterior leaflet (valve area 2.1cm^2) with mild regurg. , no stenosis/  trivial TR and PR   VAGINAL HYSTERECTOMY  1975   W/ BILATERAL SALPINOOPHOORECTOMY   VAGUS NERVE STIMULATOR INSERTION N/A 03/10/2014   Procedure: IMPLANTATION OF SACRAL  NERVE STIMULATOR ;  Surgeon: Leighton Ruff, MD;  Location: [facility];  Service: General;  Laterality: N/A;  Sacral. [employer]     OB History     Gravida  3   Para  2   Term  2   Preterm      AB  1   Living  2      SAB  1   IAB      Ectopic      Multiple      Live Births              Family History  Problem Relation Age of Onset   Coronary artery disease Father    Heart disease Mother    Heart disease Sister    Heart disease Brother     Social History   Tobacco Use   Smoking status: Former    Packs/day: 0.50    Years: 20.00    Pack years: 10.00    Types: Cigarettes    Quit date: 07/02/1988    Years since quitting: 32.7   Smokeless tobacco: Never  Vaping Use   Vaping Use: Never used  Substance Use Topics   Alcohol use: No   Drug use: Never    Home Medications Prior to Admission medications   Medication Sig Start Date End Date Taking? Authorizing Provider  albuterol (VENTOLIN HFA) 108 (90 Base) MCG/ACT inhaler Inhale 1 puff into the lungs daily as needed. 03/03/21  Yes [provider]  diphenhydrAMINE (BENADRYL) 25 mg capsule Take 25 mg by mouth at bedtime.   Yes [provider]  gabapentin (NEURONTIN) 300 MG capsule Take 300 mg by mouth in the morning and at bedtime. 03/28/21  Yes [provider]  hydrochlorothiazide (HYDRODIURIL) 12.5 MG tablet Take 1 tablet (12.5 mg total) by mouth daily. 03/28/17  Yes  Allred, Jeneen Rinks, MD  isosorbide mononitrate (IMDUR) 30 MG 24 hr tablet Take 30 mg by mouth daily.    Yes [provider]  lisinopril (PRINIVIL,ZESTRIL) 10 MG tablet Take 1 tablet (10 mg total) by mouth daily. 08/27/17  Yes Allred, Jeneen Rinks, MD  meloxicam (MOBIC) 15 MG tablet Take 15 mg by mouth in the morning and at bedtime. 07/08/20  Yes [provider]  metoprolol succinate (TOPROL-XL) 25 MG 24 hr tablet TAKE 1 TABLET TWICE A DAY Patient taking differently: Take 25 mg by mouth daily. 05/23/20  Yes Allred, Jeneen Rinks, MD  nitroGLYCERIN (NITROSTAT) 0.4 MG SL tablet DISSOLVE 1 TABLET UNDER THE TONGUE EVERY 5 MINUTES AS NEEDED FOR CHEST PAIN Patient taking differently: Place 0.4 mg under the tongue every 5 (five) minutes as needed for chest pain. 07/06/19  Yes Josue Hector, MD  pantoprazole (PROTONIX) 40 MG tablet Take 1 tablet (40 mg total) by mouth 2 (two) times daily. NEEDS VISIT FOR FURTHER REFILLS 01/19/20  Yes Pyrtle, Lajuan Lines, MD  PRADAXA 150 MG CAPS capsule TAKE 1 CAPSULE TWICE A DAY Patient taking differently: Take 150 mg by mouth 2 (two) times daily. 10/14/20  Yes Allred, Jeneen Rinks, MD  primidone (MYSOLINE) 50 MG tablet Take 2 tablets (100 mg total) by mouth at bedtime. 09/07/19  Yes Ward Givens, NP  rosuvastatin (CRESTOR) 40 MG tablet TAKE 1 TABLET DAILY Patient taking differently: Take 40 mg by mouth daily. 03/21/21  Yes Allred, Jeneen Rinks, MD  traZODone (DESYREL) 50 MG tablet Take 150 mg by mouth at bedtime as needed. 03/14/21  Yes [provider]  ondansetron (ZOFRAN) 4 MG tablet Take 1 tablet (4 mg total) by mouth every 8 (eight) hours as needed for nausea or vomiting. Patient not taking: No sig reported 05/12/20   Melina Schools, MD    Allergies    Cephalexin  Review of Systems  Review of Systems  All other systems reviewed and are negative.  Physical Exam Updated Vital Signs BP (!) 102/51   Pulse 62   Temp 98.8 F (37.1 C) (Oral)   Resp 16   SpO2 99%   Physical  Exam Vitals and nursing note reviewed.  Constitutional:      General: She is not in acute distress.    Appearance: Normal appearance. She is well-developed.  HENT:     Head: Normocephalic and atraumatic.     Nose: Nose normal.     Mouth/Throat:     Mouth: Mucous membranes are moist.  Eyes:     Extraocular Movements: Extraocular movements intact.     Pupils: Pupils are equal, round, and reactive to light.     Comments: No nystagmus noted  Cardiovascular:     Rate and Rhythm: Normal rate and regular rhythm.     Heart sounds: Normal heart sounds. No murmur heard.   No friction rub.  Pulmonary:     Effort: Pulmonary effort is normal.     Breath sounds: Normal breath sounds. No wheezing or rales.  Abdominal:     General: Bowel sounds are normal. There is no distension.     Palpations: Abdomen is soft.     Tenderness: There is no abdominal tenderness. There is no guarding or rebound.  Musculoskeletal:        General: No tenderness. Normal range of motion.     Right lower leg: No edema.     Left lower leg: No edema.     Comments: No edema  Skin:    General: Skin is warm and dry.     Findings: No rash.  Neurological:     Mental Status: She is alert and oriented to person, place, and time.     Cranial Nerves: No cranial nerve deficit or dysarthria.     Sensory: Sensation is intact.     Coordination: Coordination abnormal.     Comments: Difficulty lifting legs off the bed with intermittent jerking.  Unable to complete heel-to-shin bilaterally.  Unable to complete finger-to-nose.  No visual field cuts.  Mild weakness noted in bilateral lower extremities.  Psychiatric:        Mood and Affect: Mood normal.        Behavior: Behavior normal.    ED Results / Procedures / Treatments   Labs (all labs ordered are listed, but only abnormal results are displayed) Labs Reviewed  PROTIME-INR - Abnormal; Notable for the following components:      Result Value   Prothrombin Time 25.9 (*)     INR 2.4 (*)    All other components within normal limits  APTT - Abnormal; Notable for the following components:   aPTT 70 (*)    All other components within normal limits  CBC - Abnormal; Notable for the following components:   RBC 3.61 (*)    Hemoglobin 11.6 (*)    HCT 35.1 (*)    Platelets 147 (*)    All other components within normal limits  DIFFERENTIAL - Abnormal; Notable for the following components:   Neutro Abs 8.2 (*)    All other components within normal limits  COMPREHENSIVE METABOLIC PANEL - Abnormal; Notable for the following components:   Sodium 127 (*)    Chloride 93 (*)    Glucose, Bld 105 (*)    BUN 38 (*)    Creatinine, Ser 1.82 (*)    Total Protein 5.5 (*)    Albumin  3.2 (*)    GFR, Estimated 28 (*)    All other components within normal limits  RAPID URINE DRUG SCREEN, HOSP PERFORMED - Abnormal; Notable for the following components:   Barbiturates POSITIVE (*)    All other components within normal limits  URINALYSIS, ROUTINE W REFLEX MICROSCOPIC - Abnormal; Notable for the following components:   Hgb urine dipstick SMALL (*)    Leukocytes,Ua TRACE (*)    All other components within normal limits  I-STAT CHEM 8, ED - Abnormal; Notable for the following components:   Sodium 128 (*)    Chloride 95 (*)    BUN 35 (*)    Creatinine, Ser 1.80 (*)    Glucose, Bld 101 (*)    Hemoglobin 11.9 (*)    HCT 35.0 (*)    All other components within normal limits  RESP PANEL BY RT-PCR (FLU A&B, COVID) ARPGX2  ETHANOL  CBG MONITORING, ED    EKG EKG Interpretation  Date/Time:  Monday April 10 2021 10:38:30 EDT Ventricular Rate:  71 PR Interval:  218 QRS Duration: 66 QT Interval:  386 QTC Calculation: 419 R Axis:   -2 Text Interpretation: Sinus rhythm with 1st degree A-V block Low voltage QRS Cannot rule out Anterior infarct , age undetermined No significant change since last tracing Confirmed by Blanchie Dessert [phone]) on 04/10/2021 2:59:23  PM  Radiology CT HEAD WO CONTRAST  Result Date: 04/10/2021 CLINICAL DATA:  Blurred vision, unsteady gait EXAM: CT HEAD WITHOUT CONTRAST TECHNIQUE: Contiguous axial images were obtained from the base of the skull through the vertex without intravenous contrast. COMPARISON:  None. FINDINGS: Brain: There is loss of gray-white matter differentiation in the left frontal lobe suspicious for infarction. No evidence of acute intracranial hemorrhage, hydrocephalus, extra-axial collection or mass lesion/mass effect. Scattered low-density changes within the periventricular and subcortical white matter compatible with chronic microvascular ischemic change. Mild diffuse cerebral volume loss. Vascular: Atherosclerotic calcifications involving the large vessels of the skull base. No unexpected hyperdense vessel. Skull: Normal. Negative for fracture or focal lesion. Sinuses/Orbits: No acute finding. Other: None. IMPRESSION: 1. Loss of gray-white matter differentiation in the left frontal lobe suspicious for infarction. Further evaluation with MRI of the brain is recommended. 2. Chronic microvascular ischemic change and cerebral volume loss. These results were called by telephone at the time of interpretation on 04/10/2021 at 12:22 pm to provider MARGAUX VENTER , who verbally acknowledged these results. Electronically Signed   By: Davina Poke D.O.   On: 04/10/2021 12:24    Procedures Procedures   Medications Ordered in ED Medications  lactated ringers bolus 1,000 mL (has no administration in time range)    ED Course  I have reviewed the triage vital signs and the nursing notes.  Pertinent labs & imaging results that were available during my care of the patient were reviewed by me and considered in my medical decision making (see chart for details).    MDM Rules/Calculators/A&P                           Patient is a pleasant 75 year old female presenting today with complaints of dizziness and inability  to walk.  Patient reports falling to the right.  On exam patient is ataxic unable to complete heel-to-shin or finger-to-nose testing.  She is found to be uremic at 128 and new AKI with creatinine of 1.8.  Alcohol is within normal limits.  INR is elevated at 2.4 and patient is  on Pradaxa.  CBC within normal limits.  Head CT showed loss of gray-white matter differentiation in the left frontal lobe suspicious for infarction.  Given patient's new exam finding concern for stroke.  Discussed with neurology who will come and see the patient.  Plan will be for MRI of the brain and will admit for further care.  Patient given IV fluids.  MDM   Amount and/or Complexity of Data Reviewed Clinical lab tests: ordered and reviewed Tests in the radiology section of CPT: ordered and reviewed Tests in the medicine section of CPT: ordered and reviewed Independent visualization of images, tracings, or specimens: yes    Final Clinical Impression(s) / ED Diagnoses Final diagnoses:  Hyponatremia  Cerebrovascular accident (CVA), unspecified mechanism ([place])  AKI (acute kidney injury) ([place])    Rx / [place] Orders ED Discharge Orders     None        Blanchie Dessert, MD 04/10/21 1547

## 2021-04-10 NOTE — ED Notes (Signed)
Patient returned form MRI

## 2021-04-10 NOTE — H&P (Signed)
History and Physical    [name] YPP:[id number] DOB: Aug 25, 1945 DOA: 04/10/2021  PCP: Fransisca Connors, PA-C  Patient coming from: Home  I have personally briefly reviewed patient's old medical records available.   Chief Complaint: Feeling off balance.  Falling on the right side.  HPI: [name] is a 75 y.o. female with medical history significant of hypertension, coronary artery disease, paroxysmal A. fib anticoagulated with Pradaxa, spinal stenosis presents from home with difficulty walking.  Patient tells me that she had 1 episode of vomiting while she was at the church yesterday morning and just did not feel well.  Every time she tries to walk she falls to the right.  She felt like her head is being pulled to the right side in her body.  She never needed any help in the past, however since yesterday she is walking with a walker and her husband is walking behind her to support her.  She did feel dizzy yesterday but today she is not feeling dizzy.  Denies any further nausea, vomiting, chest pain.  Denies any shortness of breath.  Denies any abdominal pain.  Denies any change in urinary habits.  Denies any bowel habit changes. ED Course: Hemodynamically stable.  Weakness both lower extremities.  No other neurological deficits.  Initial CT scan with questionable stroke.  Subsequent MRI without evidence of acute stroke but chronic microvascular changes.  Sodium 127, creatinine is 1.8 with her baseline creatinine being 0.97. Neurology was consulted and patient was requested to be admitted to hospital.  Urinalysis is clear.  Review of Systems: all systems are reviewed and pertinent positive as per HPI otherwise rest are negative.    Past Medical History:  Diagnosis Date   Anticoagulated    Pradaxa   Bilateral leg pain    Right leg has greater pain than the left.   Coronary artery disease cardiologist-- dr Rayann Heman   a.  s/p Xience DES to RCA 04/2009;   b. TEE 2/12: EF 40%, Large PFO;   c.  Lexiscan Myoview 05/2012: EF 69%, no ischemia. LHC (05/2012):  Ostial diagonal 30-40%, proximal mid ramus intermedius 40-50%, RCA stent patent with 40-50% after stent, then 40%, distal RCA 40-50%, EF 55-65%. Medical therapy continued.;  d.  Carlton Adam Myoview (06/2013):  No ischemia, EF 83%, normal study   Full dentures    GERD (gastroesophageal reflux disease)    History of basal cell carcinoma excision    scalp   History of hiatal hernia    History of kidney stones 51 years ago   x1   History of recurrent UTIs    Hypertension    IFG (impaired fasting glucose)    MVP (mitral valve prolapse)    mild per last echo 04-03-2017 in epic   OA (osteoarthritis)    knees , fingers   PAF (paroxysmal atrial fibrillation) [facility])    cardiologist-- dr Rayann Heman   S/P ablation of atrial fibrillation 08/15/2010   S/P drug eluting coronary stent placement 04/04/2009   Spinal stenosis    Tremor    Wears glasses     Past Surgical History:  Procedure Laterality Date   ABDOMINAL EXPOSURE N/A 05/11/2020   Procedure: ABDOMINAL EXPOSURE;  Surgeon: Rosetta Posner, MD;  Location: [place];  Service: Vascular;  Laterality: N/A;   ANAL RECTAL MANOMETRY N/A 01/11/2014   Procedure: ANAL RECTAL MANOMETRY;  Surgeon: Leighton Ruff, MD;  Location: [facility];  Service: Endoscopy;  Laterality: N/A;   APPENDECTOMY  1973  BACK SURGERY     laminectomy times 2; 2000 and 2001   BUNIONECTOMY Bilateral 2013   CARDIAC CATHETERIZATION  10-24-2010   dr allred   singl-vessel CAD with patent stent mRCA/  moderate disease mRCA beyond stent segment/  normal lvsf   CARDIAC ELECTROPHYSIOLOGY MAPPING AND ABLATION  08-15-2010  dr allred   CARDIOVASCULAR STRESS TEST  06-15-2013  dr allred   normal perfusion study/  no ischemia/  ef 83%   CARPAL TUNNEL RELEASE Right 25 years ago   CATARACT EXTRACTION W/ INTRAOCULAR LENS  IMPLANT, BILATERAL     CHOLECYSTECTOMY OPEN  1973   W/  APPENDECTOMY   CORONARY ANGIOPLASTY WITH STENT  PLACEMENT  04-04-2009  dr Darnell Level brodie   PCI and DES x1 to  mRCA/  mLAD 40%, pCX 30%, pRCA 30%/  normal LVF   FLEXIBLE SIGMOIDOSCOPY N/A 07/07/2013   Procedure: FLEXIBLE SIGMOIDOSCOPY;  Surgeon: Winfield Cunas., MD;  Location: [facility];  Service: Endoscopy;  Laterality: N/A;  unprepped   LEFT HEART CATH AND CORONARY ANGIOGRAPHY N/A 10/26/2019   Procedure: LEFT HEART CATH AND CORONARY ANGIOGRAPHY;  Surgeon: Martinique, Peter M, MD;  Location: [facility];  Service: Cardiovascular;  Laterality: N/A;   LEFT HEART CATHETERIZATION WITH CORONARY ANGIOGRAM N/A 05/09/2012   Procedure: LEFT HEART CATHETERIZATION WITH CORONARY ANGIOGRAM;  Surgeon: Hillary Bow, MD;  Location: [facility] C[facility];  Service: Cardiovascular;  Laterality: N/A;   MOHS SURGERY  2009   scalp   PILONIDAL CYST EXCISION  2009;   2000;   [place] N/A 01/11/2014   Procedure: RECTAL ULTRASOUND;  Surgeon: Leighton Ruff, MD;  Location: [facility];  Service: Endoscopy;  Laterality: N/A;   TOTAL KNEE ARTHROPLASTY Right 09/07/2014   Procedure: RIGHT TOTAL KNEE ARTHROPLASTY;  Surgeon: Paralee Cancel, MD;  Location: [facility];  Service: Orthopedics;  Laterality: Right;   TOTAL KNEE ARTHROPLASTY Left 12/20/2015   Procedure: LEFT TOTAL KNEE ARTHROPLASTY;  Surgeon: Paralee Cancel, MD;  Location: [facility];  Service: Orthopedics;  Laterality: Left;   TRANSTHORACIC ECHOCARDIOGRAM  04-03-2017   dr allred   Leory Plowman 55-60%/  mild MVP anterior leaflet (valve area 2.1cm^2) with mild regurg. , no stenosis/  trivial TR and PR   VAGINAL HYSTERECTOMY  1975   W/ BILATERAL SALPINOOPHOORECTOMY   VAGUS NERVE STIMULATOR INSERTION N/A 03/10/2014   Procedure: IMPLANTATION OF SACRAL  NERVE STIMULATOR ;  Surgeon: Leighton Ruff, MD;  Location: [place];  Service: General;  Laterality: N/A;  Sacral. [employer]    Social history   reports that she quit smoking about 32 years ago. Her smoking use included cigarettes. She has a 10.00 pack-year  smoking history. She has never used smokeless tobacco. She reports that she does not drink alcohol and does not use drugs.  Allergies  Allergen Reactions   Cephalexin Nausea Only    Family History  Problem Relation Age of Onset   Coronary artery disease Father    Heart disease Mother    Heart disease Sister    Heart disease Brother      Prior to Admission medications   Medication Sig Start Date End Date Taking? Authorizing Provider  albuterol (VENTOLIN HFA) 108 (90 Base) MCG/ACT inhaler Inhale 1 puff into the lungs daily as needed. 03/03/21  Yes [provider]  diphenhydrAMINE (BENADRYL) 25 mg capsule Take 25 mg by mouth at bedtime.   Yes [provider]  gabapentin (NEURONTIN) 300 MG capsule Take 300 mg by  mouth in the morning and at bedtime. 03/28/21  Yes [provider]  hydrochlorothiazide (HYDRODIURIL) 12.5 MG tablet Take 1 tablet (12.5 mg total) by mouth daily. 03/28/17  Yes Allred, Jeneen Rinks, MD  isosorbide mononitrate (IMDUR) 30 MG 24 hr tablet Take 30 mg by mouth daily.    Yes [provider]  lisinopril (PRINIVIL,ZESTRIL) 10 MG tablet Take 1 tablet (10 mg total) by mouth daily. 08/27/17  Yes Allred, Jeneen Rinks, MD  meloxicam (MOBIC) 15 MG tablet Take 15 mg by mouth in the morning and at bedtime. 07/08/20  Yes [provider]  metoprolol succinate (TOPROL-XL) 25 MG 24 hr tablet TAKE 1 TABLET TWICE A DAY Patient taking differently: Take 25 mg by mouth daily. 05/23/20  Yes Allred, Jeneen Rinks, MD  nitroGLYCERIN (NITROSTAT) 0.4 MG SL tablet DISSOLVE 1 TABLET UNDER THE TONGUE EVERY 5 MINUTES AS NEEDED FOR CHEST PAIN Patient taking differently: Place 0.4 mg under the tongue every 5 (five) minutes as needed for chest pain. 07/06/19  Yes Josue Hector, MD  pantoprazole (PROTONIX) 40 MG tablet Take 1 tablet (40 mg total) by mouth 2 (two) times daily. NEEDS VISIT FOR FURTHER REFILLS 01/19/20  Yes Pyrtle, Lajuan Lines, MD  PRADAXA 150 MG CAPS capsule TAKE 1 CAPSULE TWICE  A DAY Patient taking differently: Take 150 mg by mouth 2 (two) times daily. 10/14/20  Yes Allred, Jeneen Rinks, MD  primidone (MYSOLINE) 50 MG tablet Take 2 tablets (100 mg total) by mouth at bedtime. 09/07/19  Yes Ward Givens, NP  rosuvastatin (CRESTOR) 40 MG tablet TAKE 1 TABLET DAILY Patient taking differently: Take 40 mg by mouth daily. 03/21/21  Yes Allred, Jeneen Rinks, MD  traZODone (DESYREL) 50 MG tablet Take 150 mg by mouth at bedtime as needed. 03/14/21  Yes [provider]  ondansetron (ZOFRAN) 4 MG tablet Take 1 tablet (4 mg total) by mouth every 8 (eight) hours as needed for nausea or vomiting. Patient not taking: No sig reported 05/12/20   Melina Schools, MD    Physical Exam: Vitals:   04/10/21 1024 04/10/21 1148 04/10/21 1313 04/10/21 1445  BP: 110/82 114/63 108/81 (!) 102/51  Pulse: 74 66 64 62  Resp: 18 15 15 16   Temp: 98.8 F (37.1 C)     TempSrc: Oral     SpO2: 99% 99% 99% 99%    Constitutional: NAD, calm, comfortable, slightly anxious. Vitals:   04/10/21 1024 04/10/21 1148 04/10/21 1313 04/10/21 1445  BP: 110/82 114/63 108/81 (!) 102/51  Pulse: 74 66 64 62  Resp: 18 15 15 16   Temp: 98.8 F (37.1 C)     TempSrc: Oral     SpO2: 99% 99% 99% 99%   Eyes: PERRL, lids and conjunctivae normal ENMT: Mucous membranes are moist. Posterior pharynx clear of any exudate or lesions.Normal dentition.  Neck: normal, supple, no masses, no thyromegaly Respiratory: clear to auscultation bilaterally, no wheezing, no crackles. Normal respiratory effort. No accessory muscle use.  Cardiovascular: Regular rate and rhythm, no murmurs / rubs / gallops. No extremity edema. 2+ pedal pulses. No carotid bruits.  Abdomen: no tenderness, no masses palpated. No hepatosplenomegaly. Bowel sounds positive.  Musculoskeletal: no clubbing / cyanosis. No joint deformity upper and lower extremities. Good ROM, no contractures. Normal muscle tone.  Skin: no rashes, lesions, ulcers. No  induration Neurologic: CN 2-12 grossly intact. Sensation intact, DTR normal. Strength 5/5 in all 4.  She has some difficulty with both legs, however no obvious focal deficits. Psychiatric: Normal judgment and insight. Alert and oriented  x 3. Normal mood.     Labs on Admission: I have personally reviewed following labs and imaging studies  CBC: Recent Labs  Lab 04/10/21 1035 04/10/21 1110  WBC 10.2  --   NEUTROABS 8.2*  --   HGB 11.6* 11.9*  HCT 35.1* 35.0*  MCV 97.2  --   PLT 147*  --    Basic Metabolic Panel: Recent Labs  Lab 04/10/21 1035 04/10/21 1110  NA 127* 128*  K 4.7 4.6  CL 93* 95*  CO2 24  --   GLUCOSE 105* 101*  BUN 38* 35*  CREATININE 1.82* 1.80*  CALCIUM 9.1  --    GFR: CrCl cannot be calculated (Unknown ideal weight.). Liver Function Tests: Recent Labs  Lab 04/10/21 1035  AST 17  ALT 12  ALKPHOS 49  BILITOT 0.8  PROT 5.5*  ALBUMIN 3.2*   No results for input(s): LIPASE, AMYLASE in the last 168 hours. No results for input(s): AMMONIA in the last 168 hours. Coagulation Profile: Recent Labs  Lab 04/10/21 1035  INR 2.4*   Cardiac Enzymes: No results for input(s): CKTOTAL, CKMB, CKMBINDEX, TROPONINI in the last 168 hours. BNP (last 3 results) No results for input(s): PROBNP in the last 8760 hours. HbA1C: No results for input(s): HGBA1C in the last 72 hours. CBG: Recent Labs  Lab 04/10/21 1026  GLUCAP 96   Lipid Profile: No results for input(s): CHOL, HDL, LDLCALC, TRIG, CHOLHDL, LDLDIRECT in the last 72 hours. Thyroid Function Tests: No results for input(s): TSH, T4TOTAL, FREET4, T3FREE, THYROIDAB in the last 72 hours. Anemia Panel: No results for input(s): VITAMINB12, FOLATE, FERRITIN, TIBC, IRON, RETICCTPCT in the last 72 hours. Urine analysis:    Component Value Date/Time   COLORURINE YELLOW 04/10/2021 1401   APPEARANCEUR CLEAR 04/10/2021 1401   LABSPEC 1.010 04/10/2021 1401   PHURINE 5.0 04/10/2021 1401   GLUCOSEU NEGATIVE  04/10/2021 1401   HGBUR SMALL (A) 04/10/2021 1401   BILIRUBINUR NEGATIVE 04/10/2021 1401   KETONESUR NEGATIVE 04/10/2021 1401   PROTEINUR NEGATIVE 04/10/2021 1401   UROBILINOGEN 0.2 08/30/2014 1500   NITRITE NEGATIVE 04/10/2021 1401   LEUKOCYTESUR TRACE (A) 04/10/2021 1401    Radiological Exams on Admission: CT HEAD WO CONTRAST  Result Date: 04/10/2021 CLINICAL DATA:  Blurred vision, unsteady gait EXAM: CT HEAD WITHOUT CONTRAST TECHNIQUE: Contiguous axial images were obtained from the base of the skull through the vertex without intravenous contrast. COMPARISON:  None. FINDINGS: Brain: There is loss of gray-white matter differentiation in the left frontal lobe suspicious for infarction. No evidence of acute intracranial hemorrhage, hydrocephalus, extra-axial collection or mass lesion/mass effect. Scattered low-density changes within the periventricular and subcortical white matter compatible with chronic microvascular ischemic change. Mild diffuse cerebral volume loss. Vascular: Atherosclerotic calcifications involving the large vessels of the skull base. No unexpected hyperdense vessel. Skull: Normal. Negative for fracture or focal lesion. Sinuses/Orbits: No acute finding. Other: None. IMPRESSION: 1. Loss of gray-white matter differentiation in the left frontal lobe suspicious for infarction. Further evaluation with MRI of the brain is recommended. 2. Chronic microvascular ischemic change and cerebral volume loss. These results were called by telephone at the time of interpretation on 04/10/2021 at 12:22 pm to provider MARGAUX VENTER , who verbally acknowledged these results. Electronically Signed   By: Davina Poke D.O.   On: 04/10/2021 12:24   MR BRAIN WO CONTRAST  Result Date: 04/10/2021 CLINICAL DATA:  Stroke, follow-up. EXAM: MRI HEAD WITHOUT CONTRAST TECHNIQUE: Multiplanar, multiecho pulse sequences of the brain and  surrounding structures were obtained without intravenous contrast.  COMPARISON:  Head CT 04/10/2021. FINDINGS: Brain: Cerebral volume is normal for age. Mild multifocal T2 FLAIR hyperintense signal abnormality within the cerebral white matter and pons, nonspecific but compatible with chronic small vessel ischemic disease. Prominent perivascular spaces within the inferior basal ganglia bilaterally. There is no acute infarct. No evidence of an intracranial mass. No chronic intracranial blood products. No extra-axial fluid collection. No midline shift. Partially empty sella turcica. Vascular: Maintained flow voids within the proximal large arterial vessels. Skull and upper cervical spine: No focal suspicious marrow lesion. Sinuses/Orbits: Visualized orbits show no acute finding. Bilateral lens replacements. Trace mucosal thickening within the bilateral ethmoid and sphenoid sinuses. Small-volume frothy secretions also present within the right sphenoid sinus. IMPRESSION: No evidence of acute intracranial abnormality. Mild chronic small vessel ischemic changes within the cerebral white matter and pons. Paranasal sinus disease, as described. Electronically Signed   By: Kellie Simmering D.O.   On: 04/10/2021 16:21    EKG: Independently reviewed.  Sinus rhythm.  Low voltage EKG.  No acute changes.  Assessment/Plan Principal Problem:   TIA (transient ischemic attack) Active Problems:   Essential hypertension   Atrial fibrillation (HCC)   Chronic anticoagulation   Stroke (cerebrum) ([place])     1.  Suspected TIA: Observe in monitored unit because of severity of symptoms on presentation. Neurochecks and vital signs as per stroke protocol. Patient was not a TPA and vascular intervention candidate because of symptom onset more than 24 hours and mild symptoms. Diet, passed bedside swallow evaluation. Antiplatelets, already on Pradaxa.  We will continue.  No evidence of complications. Statin, on a statin that we will continue.  Check LDL levels. Blood pressure goals, normotensive  with normal MRI findings. Consultations, neurology, speech, PT OT MRI of the brain with chronic microvascular changes. We will check carotid duplexes to rule out any significant carotid artery stenosis.  Since patient's MRI was without any evidence of acute stroke, I will refer to neurology for further neurological testings and any investigations if needed. Her symptomatology could be related to dehydration and orthostatic.  2.  Acute renal failure with hyponatremia: Suspect nausea and dehydration.  Along with use of antihypertensives and hydrochlorothiazide. Hydrated with IV fluid, normal saline 100 mL/h.  Recheck levels tomorrow morning. Discontinue hydrochlorothiazide.  Hold meloxicam. Will continue Imdur, lisinopril and metoprolol for blood pressure control.  3.  Paroxysmal A. fib: Currently in sinus rhythm.  Rate controlled on metoprolol.  Therapeutic on Pradaxa.  With no evidence of intracranial complications, will continue Pradaxa.  4.  Insomnia: On trazodone and Benadryl that she will continue.  DVT prophylaxis: Pradaxa Code Status: Full code Family Communication: Husband at the bedside Disposition Plan: Home Consults called: Neurology Admission status: Observation.  Telemetry.   Barb Merino MD [facility] Pager [phone]

## 2021-04-11 ENCOUNTER — Encounter (HOSPITAL_COMMUNITY): Payer: Medicare Other

## 2021-04-11 ENCOUNTER — Other Ambulatory Visit (HOSPITAL_COMMUNITY): Payer: Self-pay

## 2021-04-11 DIAGNOSIS — H8110 Benign paroxysmal vertigo, unspecified ear: Secondary | ICD-10-CM | POA: Diagnosis not present

## 2021-04-11 DIAGNOSIS — I10 Essential (primary) hypertension: Secondary | ICD-10-CM | POA: Diagnosis not present

## 2021-04-11 LAB — BASIC METABOLIC PANEL
Anion gap: 8 (ref 5–15)
BUN: 34 mg/dL — ABNORMAL HIGH (ref 8–23)
CO2: 24 mmol/L (ref 22–32)
Calcium: 8.6 mg/dL — ABNORMAL LOW (ref 8.9–10.3)
Chloride: 98 mmol/L (ref 98–111)
Creatinine, Ser: 1.54 mg/dL — ABNORMAL HIGH (ref 0.44–1.00)
GFR, Estimated: 35 mL/min — ABNORMAL LOW (ref >60)
Glucose, Bld: 107 mg/dL — ABNORMAL HIGH (ref 70–99)
Potassium: 4.4 mmol/L (ref 3.5–5.1)
Sodium: 130 mmol/L — ABNORMAL LOW (ref 135–145)

## 2021-04-11 MED ORDER — MECLIZINE HCL 25 MG PO TABS
ORAL_TABLET | Freq: Three times a day (TID) | ORAL | 0 refills | Status: DC | PRN
  Filled 2021-04-11: qty 21, 7d supply, fill #0

## 2021-04-11 NOTE — Plan of Care (Signed)
Pt is alert oriented x 4. Pt is ambulatory, +1 assist to bathroom. Pt c/o dizziness when walking.  SCDs placed. Denies pain.   Problem: Education: Goal: Knowledge of General Education information will improve Description: Including pain rating scale, medication(s)/side effects and non-pharmacologic comfort measures Outcome: Progressing   Problem: Health Behavior/Discharge Planning: Goal: Ability to manage health-related needs will improve Outcome: Progressing   Problem: Clinical Measurements: Goal: Ability to maintain clinical measurements within normal limits will improve Outcome: Progressing Goal: Will remain free from infection Outcome: Progressing Goal: Diagnostic test results will improve Outcome: Progressing Goal: Respiratory complications will improve Outcome: Progressing Goal: Cardiovascular complication will be avoided Outcome: Progressing   Problem: Activity: Goal: Risk for activity intolerance will decrease Outcome: Progressing   Problem: Activity: Goal: Risk for activity intolerance will decrease Outcome: Progressing   Problem: Nutrition: Goal: Adequate nutrition will be maintained Outcome: Progressing   Problem: Coping: Goal: Level of anxiety will decrease Outcome: Progressing   Problem: Elimination: Goal: Will not experience complications related to bowel motility Outcome: Progressing Goal: Will not experience complications related to urinary retention Outcome: Progressing   Problem: Pain Managment: Goal: General experience of comfort will improve Outcome: Progressing   Problem: Safety: Goal: Ability to remain free from injury will improve Outcome: Progressing   Problem: Skin Integrity: Goal: Risk for impaired skin integrity will decrease Outcome: Progressing

## 2021-04-11 NOTE — Discharge Summary (Signed)
Physician Discharge Summary  [name] ZCH:[id number] DOB: 04/22/1946 DOA: 04/10/2021  PCP: Fransisca Connors, PA-C  Admit date: 04/10/2021 Discharge date: 04/11/2021  Admitted From: Home Disposition:  Home  Recommendations for Outpatient Follow-up:  Follow up with PCP in 1-2 weeks Please obtain BMP/CBC in one week   Home Health:no Equipment/Devices:None  Discharge Condition:Stable CODE STATUS:Full Diet recommendation: Heart Healthy   Brief/Interim Summary: 75 y.o. female with medical history significant of hypertension, coronary artery disease, paroxysmal A. fib anticoagulated with Pradaxa, spinal stenosis presents from home with difficulty walking.  Patient tells me that she had 1 episode of vomiting while she was at the church yesterday morning and just did not feel well.  Every time she tries to walk she falls to the right.  She felt like her head is being pulled to the right side in her body.  She never needed any help in the past, however since yesterday she is walking with a walker and her husband is walking behind her to support her.  She did feel dizzy yesterday but today she is not feeling dizzy.  Discharge Diagnoses:  Principal Problem:   Benign positional vertigo Active Problems:   Essential hypertension   Atrial fibrillation (HCC)   Chronic anticoagulation   Stroke (cerebrum) (HCC)  Benign positional vertigo: CT of the head was done that showed loss of gray-white matter differential in the frontal and MRI was done of the brain that showed Acute intracranial abnormalities chronic small vessel changes. Neurology was consulted related who related is probably BPH. Physical therapy evaluated the patient she was started on Antivert her symptoms resolved in house.  Acute kidney injury/hyponatremia: Likely due to nausea and vomiting improved with IV fluid hydration. Likely prerenal azotemia.  Paroxysmal atrial fibrillation: Rate controlled in sinus rhythm continue  Pradaxa.  Insomnia: Continue trazodone.  Discharge Instructions  Discharge Instructions     Diet - low sodium heart healthy   Complete by: As directed    Increase activity slowly   Complete by: As directed       Allergies as of 04/11/2021       Reactions   Cephalexin Nausea Only        Medication List     STOP taking these medications    meloxicam 15 MG tablet Commonly known as: MOBIC       TAKE these medications    albuterol 108 (90 Base) MCG/ACT inhaler Commonly known as: VENTOLIN HFA Inhale 1 puff into the lungs daily as needed.   diphenhydrAMINE 25 mg capsule Commonly known as: BENADRYL Take 25 mg by mouth at bedtime.   gabapentin 300 MG capsule Commonly known as: NEURONTIN Take 300 mg by mouth in the morning and at bedtime.   hydrochlorothiazide 12.5 MG tablet Commonly known as: HYDRODIURIL Take 1 tablet (12.5 mg total) by mouth daily.   isosorbide mononitrate 30 MG 24 hr tablet Commonly known as: IMDUR Take 30 mg by mouth daily.   lisinopril 10 MG tablet Commonly known as: ZESTRIL Take 1 tablet (10 mg total) by mouth daily.   Meclizine HCl 25 MG Chew Commonly known as: Antivert Chew 1 tablet (25 mg total) by mouth every 8 (eight) hours as needed.   metoprolol succinate 25 MG 24 hr tablet Commonly known as: TOPROL-XL TAKE 1 TABLET TWICE A DAY What changed: when to take this   nitroGLYCERIN 0.4 MG SL tablet Commonly known as: NITROSTAT DISSOLVE 1 TABLET UNDER THE TONGUE EVERY 5 MINUTES AS NEEDED FOR CHEST PAIN  What changed: See the new instructions.   ondansetron 4 MG tablet Commonly known as: Zofran Take 1 tablet (4 mg total) by mouth every 8 (eight) hours as needed for nausea or vomiting.   pantoprazole 40 MG tablet Commonly known as: PROTONIX Take 1 tablet (40 mg total) by mouth 2 (two) times daily. NEEDS VISIT FOR FURTHER REFILLS   Pradaxa 150 MG Caps capsule Generic drug: dabigatran TAKE 1 CAPSULE TWICE A DAY What changed:  how much to take   primidone 50 MG tablet Commonly known as: MYSOLINE Take 2 tablets (100 mg total) by mouth at bedtime.   rosuvastatin 40 MG tablet Commonly known as: CRESTOR TAKE 1 TABLET DAILY   traZODone 50 MG tablet Commonly known as: DESYREL Take 150 mg by mouth at bedtime as needed.        Allergies  Allergen Reactions   Cephalexin Nausea Only    Consultations: Neurology   Procedures/Studies: CT HEAD WO CONTRAST  Result Date: 04/10/2021 CLINICAL DATA:  Blurred vision, unsteady gait EXAM: CT HEAD WITHOUT CONTRAST TECHNIQUE: Contiguous axial images were obtained from the base of the skull through the vertex without intravenous contrast. COMPARISON:  None. FINDINGS: Brain: There is loss of gray-white matter differentiation in the left frontal lobe suspicious for infarction. No evidence of acute intracranial hemorrhage, hydrocephalus, extra-axial collection or mass lesion/mass effect. Scattered low-density changes within the periventricular and subcortical white matter compatible with chronic microvascular ischemic change. Mild diffuse cerebral volume loss. Vascular: Atherosclerotic calcifications involving the large vessels of the skull base. No unexpected hyperdense vessel. Skull: Normal. Negative for fracture or focal lesion. Sinuses/Orbits: No acute finding. Other: None. IMPRESSION: 1. Loss of gray-white matter differentiation in the left frontal lobe suspicious for infarction. Further evaluation with MRI of the brain is recommended. 2. Chronic microvascular ischemic change and cerebral volume loss. These results were called by telephone at the time of interpretation on 04/10/2021 at 12:22 pm to provider MARGAUX VENTER , who verbally acknowledged these results. Electronically Signed   By: Davina Poke D.O.   On: 04/10/2021 12:24   MR ANGIO HEAD WO CONTRAST  Result Date: 04/10/2021 CLINICAL DATA:  Acute neurologic deficit EXAM: MRA HEAD WITHOUT CONTRAST TECHNIQUE:  Angiographic images of the Circle of Willis were acquired using MRA technique without intravenous contrast. COMPARISON:  No pertinent prior exam. FINDINGS: POSTERIOR CIRCULATION: --Vertebral arteries: Normal --Inferior cerebellar arteries: Normal. --Basilar artery: Normal. --Superior cerebellar arteries: Normal. --Posterior cerebral arteries: Normal. ANTERIOR CIRCULATION: --Intracranial internal carotid arteries: Normal. --Anterior cerebral arteries (ACA): Normal. --Middle cerebral arteries (MCA): Normal. ANATOMIC VARIANTS: None IMPRESSION: Normal intracranial MRA. Electronically Signed   By: Ulyses Jarred M.D.   On: 04/10/2021 21:01   MR BRAIN WO CONTRAST  Result Date: 04/10/2021 CLINICAL DATA:  Stroke, follow-up. EXAM: MRI HEAD WITHOUT CONTRAST TECHNIQUE: Multiplanar, multiecho pulse sequences of the brain and surrounding structures were obtained without intravenous contrast. COMPARISON:  Head CT 04/10/2021. FINDINGS: Brain: Cerebral volume is normal for age. Mild multifocal T2 FLAIR hyperintense signal abnormality within the cerebral white matter and pons, nonspecific but compatible with chronic small vessel ischemic disease. Prominent perivascular spaces within the inferior basal ganglia bilaterally. There is no acute infarct. No evidence of an intracranial mass. No chronic intracranial blood products. No extra-axial fluid collection. No midline shift. Partially empty sella turcica. Vascular: Maintained flow voids within the proximal large arterial vessels. Skull and upper cervical spine: No focal suspicious marrow lesion. Sinuses/Orbits: Visualized orbits show no acute finding. Bilateral lens replacements. Trace mucosal thickening  within the bilateral ethmoid and sphenoid sinuses. Small-volume frothy secretions also present within the right sphenoid sinus. IMPRESSION: No evidence of acute intracranial abnormality. Mild chronic small vessel ischemic changes within the cerebral white matter and pons.  Paranasal sinus disease, as described. Electronically Signed   By: Kellie Simmering D.O.   On: 04/10/2021 16:21   (Echo, Carotid, EGD, Colonoscopy, ERCP)    Subjective: No complaints  Discharge Exam: Vitals:   04/11/21 0323 04/11/21 0539  BP: (!) 144/69 115/61  Pulse: 74 75  Resp:    Temp: 98.2 F (36.8 C) 98 F (36.7 C)  SpO2: 99% 100%   Vitals:   04/10/21 2345 04/11/21 0230 04/11/21 0323 04/11/21 0539  BP: (!) 142/63 130/65 (!) 144/69 115/61  Pulse: 72 74 74 75  Resp: 17 18    Temp:   98.2 F (36.8 C) 98 F (36.7 C)  TempSrc:   Axillary Axillary  SpO2: 100% 100% 99% 100%    General: Pt is alert, awake, not in acute distress Cardiovascular: RRR, S1/S2 +, no rubs, no gallops Respiratory: CTA bilaterally, no wheezing, no rhonchi Abdominal: Soft, NT, ND, bowel sounds + Extremities: no edema, no cyanosis    The results of significant diagnostics from this hospitalization (including imaging, microbiology, ancillary and laboratory) are listed below for reference.     Microbiology: Recent Results (from the past 240 hour(s))  Resp Panel by RT-PCR (Flu A&B, Covid) Nasopharyngeal Swab     Status: None   Collection Time: 04/10/21 10:28 AM   Specimen: Nasopharyngeal Swab; Nasopharyngeal(NP) swabs in vial transport medium  Result Value Ref Range Status   SARS Coronavirus 2 by RT PCR NEGATIVE NEGATIVE Final    Comment: (NOTE) SARS-CoV-2 target nucleic acids are NOT DETECTED.  The SARS-CoV-2 RNA is generally detectable in upper respiratory specimens during the acute phase of infection. The lowest concentration of SARS-CoV-2 viral copies this assay can detect is 138 copies/mL. A negative result does not preclude SARS-Cov-2 infection and should not be used as the sole basis for treatment or other patient management decisions. A negative result may occur with  improper specimen collection/handling, submission of specimen other than nasopharyngeal swab, presence of viral  mutation(s) within the areas targeted by this assay, and inadequate number of viral copies(<138 copies/mL). A negative result must be combined with clinical observations, patient history, and epidemiological information. The expected result is Negative.  Fact Sheet for Patients:  EntrepreneurPulse.com.au  Fact Sheet for Healthcare Providers:  IncredibleEmployment.be  This test is no t yet approved or cleared by the [place] FDA and  has been authorized for detection and/or diagnosis of SARS-CoV-2 by FDA under an Emergency Use Authorization (EUA). This EUA will remain  in effect (meaning this test can be used) for the duration of the COVID-19 declaration under Section 564(b)(1) of the Act, 21 U.S.C.section 360bbb-3(b)(1), unless the authorization is terminated  or revoked sooner.       Influenza A by PCR NEGATIVE NEGATIVE Final   Influenza B by PCR NEGATIVE NEGATIVE Final    Comment: (NOTE) The Xpert Xpress SARS-CoV-2/FLU/RSV plus assay is intended as an aid in the diagnosis of influenza from Nasopharyngeal swab specimens and should not be used as a sole basis for treatment. Nasal washings and aspirates are unacceptable for Xpert Xpress SARS-CoV-2/FLU/RSV testing.  Fact Sheet for Patients: EntrepreneurPulse.com.au  Fact Sheet for Healthcare Providers: IncredibleEmployment.be  This test is not yet approved or cleared by the [place] FDA and has been authorized for detection and/or  diagnosis of SARS-CoV-2 by FDA under an Emergency Use Authorization (EUA). This EUA will remain in effect (meaning this test can be used) for the duration of the COVID-19 declaration under Section 564(b)(1) of the Act, 21 U.S.C. section 360bbb-3(b)(1), unless the authorization is terminated or revoked.  Performed at [facility], [place] [address], [place], [place] [address]      Labs: BNP (last 3 results) No  results for input(s): BNP in the last 8760 hours. Basic Metabolic Panel: Recent Labs  Lab 04/10/21 1035 04/10/21 1110 04/11/21 0311  NA 127* 128* 130*  K 4.7 4.6 4.4  CL 93* 95* 98  CO2 24  --  24  GLUCOSE 105* 101* 107*  BUN 38* 35* 34*  CREATININE 1.82* 1.80* 1.54*  CALCIUM 9.1  --  8.6*   Liver Function Tests: Recent Labs  Lab 04/10/21 1035  AST 17  ALT 12  ALKPHOS 49  BILITOT 0.8  PROT 5.5*  ALBUMIN 3.2*   No results for input(s): LIPASE, AMYLASE in the last 168 hours. No results for input(s): AMMONIA in the last 168 hours. CBC: Recent Labs  Lab 04/10/21 1035 04/10/21 1110  WBC 10.2  --   NEUTROABS 8.2*  --   HGB 11.6* 11.9*  HCT 35.1* 35.0*  MCV 97.2  --   PLT 147*  --    Cardiac Enzymes: No results for input(s): CKTOTAL, CKMB, CKMBINDEX, TROPONINI in the last 168 hours. BNP: Invalid input(s): POCBNP CBG: Recent Labs  Lab 04/10/21 1026  GLUCAP 96   D-Dimer No results for input(s): DDIMER in the last 72 hours. Hgb A1c No results for input(s): HGBA1C in the last 72 hours. Lipid Profile No results for input(s): CHOL, HDL, LDLCALC, TRIG, CHOLHDL, LDLDIRECT in the last 72 hours. Thyroid function studies No results for input(s): TSH, T4TOTAL, T3FREE, THYROIDAB in the last 72 hours.  Invalid input(s): FREET3 Anemia work up No results for input(s): VITAMINB12, FOLATE, FERRITIN, TIBC, IRON, RETICCTPCT in the last 72 hours. Urinalysis    Component Value Date/Time   COLORURINE YELLOW 04/10/2021 1401   APPEARANCEUR CLEAR 04/10/2021 1401   LABSPEC 1.010 04/10/2021 1401   PHURINE 5.0 04/10/2021 1401   GLUCOSEU NEGATIVE 04/10/2021 1401   HGBUR SMALL (A) 04/10/2021 1401   BILIRUBINUR NEGATIVE 04/10/2021 1401   KETONESUR NEGATIVE 04/10/2021 1401   PROTEINUR NEGATIVE 04/10/2021 1401   UROBILINOGEN 0.2 08/30/2014 1500   NITRITE NEGATIVE 04/10/2021 1401   LEUKOCYTESUR TRACE (A) 04/10/2021 1401   Sepsis Labs Invalid input(s): PROCALCITONIN,  WBC,   LACTICIDVEN Microbiology Recent Results (from the past 240 hour(s))  Resp Panel by RT-PCR (Flu A&B, Covid) Nasopharyngeal Swab     Status: None   Collection Time: 04/10/21 10:28 AM   Specimen: Nasopharyngeal Swab; Nasopharyngeal(NP) swabs in vial transport medium  Result Value Ref Range Status   SARS Coronavirus 2 by RT PCR NEGATIVE NEGATIVE Final    Comment: (NOTE) SARS-CoV-2 target nucleic acids are NOT DETECTED.  The SARS-CoV-2 RNA is generally detectable in upper respiratory specimens during the acute phase of infection. The lowest concentration of SARS-CoV-2 viral copies this assay can detect is 138 copies/mL. A negative result does not preclude SARS-Cov-2 infection and should not be used as the sole basis for treatment or other patient management decisions. A negative result may occur with  improper specimen collection/handling, submission of specimen other than nasopharyngeal swab, presence of viral mutation(s) within the areas targeted by this assay, and inadequate number of viral copies(<138 copies/mL). A negative result  must be combined with clinical observations, patient history, and epidemiological information. The expected result is Negative.  Fact Sheet for Patients:  EntrepreneurPulse.com.au  Fact Sheet for Healthcare Providers:  IncredibleEmployment.be  This test is no t yet approved or cleared by the [place] FDA and  has been authorized for detection and/or diagnosis of SARS-CoV-2 by FDA under an Emergency Use Authorization (EUA). This EUA will remain  in effect (meaning this test can be used) for the duration of the COVID-19 declaration under Section 564(b)(1) of the Act, 21 U.S.C.section 360bbb-3(b)(1), unless the authorization is terminated  or revoked sooner.       Influenza A by PCR NEGATIVE NEGATIVE Final   Influenza B by PCR NEGATIVE NEGATIVE Final    Comment: (NOTE) The Xpert Xpress SARS-CoV-2/FLU/RSV plus  assay is intended as an aid in the diagnosis of influenza from Nasopharyngeal swab specimens and should not be used as a sole basis for treatment. Nasal washings and aspirates are unacceptable for Xpert Xpress SARS-CoV-2/FLU/RSV testing.  Fact Sheet for Patients: EntrepreneurPulse.com.au  Fact Sheet for Healthcare Providers: IncredibleEmployment.be  This test is not yet approved or cleared by the [place] FDA and has been authorized for detection and/or diagnosis of SARS-CoV-2 by FDA under an Emergency Use Authorization (EUA). This EUA will remain in effect (meaning this test can be used) for the duration of the COVID-19 declaration under Section 564(b)(1) of the Act, 21 U.S.C. section 360bbb-3(b)(1), unless the authorization is terminated or revoked.  Performed at [facility], [place] [address] [place], [place] [address]     SIGNED:   Charlynne Cousins, MD  [facility] 04/11/2021, 7:50 AM Pager   If 7PM-7AM, please contact night-coverage [email or web address] Password [email or web address]

## 2021-04-11 NOTE — TOC Transition Note (Signed)
Transition of Care [facility]) - CM/SW Discharge Note   Patient Details  Name: [name] MRN: [id number] Date of Birth: 1946/05/29  [facility] CM/SW Contact:  Pollie Friar, RN Phone Number: 04/11/2021, 11:00 AM   Clinical Narrative:    Patient is discharging home with outpatient therapy through [facility]. Information on the AVS.  Recommendations for tub bench. Pt's insurance will not cover the DME. Pt states she will obtain a tub bench through her church.  Pt denies issues with home medications or transportation.  Spouse is with her most of the time and can provide needed supervision. Pt's spouse providing transport home.   Final next level of care: OP Rehab Barriers to Discharge: No Barriers Identified   Patient Goals and CMS Choice     Choice offered to / list presented to : Patient  Discharge Placement                       Discharge Plan and Services                                     Social Determinants of Health (SDOH) Interventions     Readmission Risk Interventions No flowsheet data found.

## 2021-04-11 NOTE — Evaluation (Signed)
Physical Therapy Evaluation Patient Details Name: DEVONNE KITCHEN MRN: [id number] DOB: 05-21-1946 Today's Date: 04/11/2021  History of Present Illness  PT is a 75 yo female admitted with benign positional vertigo after feeling unsteady on her feet and feeling as if she is pulling to the right when walking. Pt with some n/v.  MRI showed small vessel changes.  Pt also with ARI from dehydration.  PMH:  HTN, CAD, spinal stenosis with recent surgery in 11/21.  Clinical Impression  Patient presents with decreased mobility due to general weakness with some tremor/motor impersistence in LE's, decreased balance and decreased endurance.  Educated on fall prevention including using RW at home.  She will benefit from follow up outpatient PT at d/c.  Plans for d/c this am so no further acute PT needs, will sign off.      Recommendations for follow up therapy are one component of a multi-disciplinary discharge planning process, led by the attending physician.  Recommendations may be updated based on patient status, additional functional criteria and insurance authorization.  Follow Up Recommendations Outpatient PT;Supervision for mobility/OOB    Equipment Recommendations  None recommended by PT    Recommendations for Other Services       Precautions / Restrictions Precautions Precautions: Fall Restrictions Weight Bearing Restrictions: No      Mobility  Bed Mobility Overal bed mobility: Modified Independent             General bed mobility comments: Pt in chair on arrival.    Transfers Overall transfer level: Needs assistance Equipment used: None Transfers: Sit to/from Stand Sit to Stand: Supervision Stand pivot transfers: Supervision       General transfer comment: declined walker use  Ambulation/Gait Ambulation/Gait assistance: Min guard Gait Distance (Feet): 180 Feet Assistive device: None Gait Pattern/deviations: Decreased stride length;Step-to pattern;Step-through  pattern;Shuffle     General Gait Details: mild anterior bias at times and increased speed with decreased control with fatigue, reports some back pain with increased distances  Stairs            Wheelchair Mobility    Modified Rankin (Stroke Patients Only)       Balance Overall balance assessment: Needs assistance   Sitting balance-Leahy Scale: Good     Standing balance support: No upper extremity supported Standing balance-Leahy Scale: Good Standing balance comment: able to look over each shoulder with S, stand 10s eyes close with S, and reaching forward abouit 8" out of BOS               High Level Balance Comments: veers to R with R head turns with walking, no veering looking L, slower pace when looking up, increased time to turn around and stop and minimal changes in speed with cues to increase and slow speed             Pertinent Vitals/Pain Pain Assessment: No/denies pain    Home Living Family/patient expects to be discharged to:: Private residence Living Arrangements: Spouse/significant other Available Help at Discharge: Family;Available 24 hours/day Type of Home: House Home Access: Ramped entrance     Home Layout: One level Home Equipment: Walker - 2 wheels;Grab bars - tub/shower;Grab bars - toilet;Walker - 4 wheels      Prior Function Level of Independence: Independent         Comments: Spouse drives most of time and available to assist if needed.     Hand Dominance   Dominant Hand: Right    Extremity/Trunk Assessment   Upper  Extremity Assessment Upper Extremity Assessment: Overall WFL for tasks assessed    Lower Extremity Assessment Lower Extremity Assessment: RLE deficits/detail;LLE deficits/detail RLE Deficits / Details: AROM and strength WFL but some motor imperesistence noted in LE's LLE Deficits / Details: AROM and strength WFL but some motor imperesistence noted in LE's    Cervical / Trunk Assessment Cervical / Trunk  Assessment: Normal  Communication   Communication: No difficulties  Cognition Arousal/Alertness: Awake/alert Behavior During Therapy: WFL for tasks assessed/performed Overall Cognitive Status: Within Functional Limits for tasks assessed                                        General Comments General comments (skin integrity, edema, etc.): no dizziness or pulling sensations,  Reports she got rollator to use when going to great grandson's games which is downhill.  Educated to use walker full time for now and in fall prevention tips including lighting, footwear, shower seat, and no clutter on the floor.  Spouse present during education as well.    Exercises     Assessment/Plan    PT Assessment All further PT needs can be met in the next venue of care  PT Problem List Decreased balance;Decreased coordination;Decreased mobility;Decreased safety awareness;Decreased activity tolerance;Decreased knowledge of use of DME       PT Treatment Interventions      PT Goals (Current goals can be found in the Care Plan section)  Acute Rehab PT Goals Patient Stated Goal: to go home PT Goal Formulation: All assessment and education complete, DC therapy    Frequency     Barriers to discharge        Co-evaluation               AM-PAC PT "6 Clicks" Mobility  Outcome Measure Help needed turning from your back to your side while in a flat bed without using bedrails?: None Help needed moving from lying on your back to sitting on the side of a flat bed without using bedrails?: None Help needed moving to and from a bed to a chair (including a wheelchair)?: None Help needed standing up from a chair using your arms (e.g., wheelchair or bedside chair)?: A Little Help needed to walk in hospital room?: A Little Help needed climbing 3-5 steps with a railing? : A Little 6 Click Score: 21    End of Session   Activity Tolerance: Patient tolerated treatment well Patient left: in  chair;with call bell/phone within reach;with family/visitor present   PT Visit Diagnosis: Other abnormalities of gait and mobility (R26.89);Other symptoms and signs involving the nervous system (R29.898)    Time: 1103-1594 PT Time Calculation (min) (ACUTE ONLY): 24 min   Charges:   PT Evaluation $PT Eval Moderate Complexity: 1 Mod PT Treatments $Self Care/Home Management: 8-22        Magda Kiel, PT Acute Rehabilitation Services Pager:[phone] Office:[phone] 04/11/2021   Reginia Naas 04/11/2021, 10:51 AM

## 2021-04-11 NOTE — Plan of Care (Signed)
Neurology plan of care note:  MRI b negative for stroke.  MRA negative for vessel occlusion/stenosis. (Had to change to MRA due to patient's low GFR.   We feel this is BPPV.   Neurology Will be available prn for questions.   Clance Boll, MSN, APN-BC Neurology Nurse Practitioner Pager [phone] Discussed with Dr. Theda Sers.

## 2021-04-11 NOTE — Discharge Instructions (Signed)

## 2021-04-11 NOTE — Evaluation (Signed)
Occupational Therapy Evaluation and Discharge Summary Patient Details Name: [name] MRN: [id number] DOB: [birth year] Today's Date: 04/11/2021   History of Present Illness PT is a 75 yo female admitted with benign positional vertigo after feeling unsteady on her feet and feeling as if she is pulling to the right when walking. Pt with some n/v.  MRI showed small vessel changes.  Pt also with ARI from dehydration.  PMH:  HTN, CAD, spinal stenosis with recent surgery in 11/21.   Clinical Impression   PT admitted with the above diagnosis and overall is supervision with basic adls using a walker.  Pt lives with husband who is available to assist at all times and does most of the driving.  Pt did not have dizziness during this session and completed all basic adls with only cues for compensation techniques.  Pt to receive OPPT for ongoing balance issues but is not in need of post acute OT at this time.       Recommendations for follow up therapy are one component of a multi-disciplinary discharge planning process, led by the attending physician.  Recommendations may be updated based on patient status, additional functional criteria and insurance authorization.   Follow Up Recommendations  No OT follow up;Supervision - Intermittent    Equipment Recommendations  Tub/shower seat;Other (comment) (husband thinks he has seat in shed)    Recommendations for Other Services       Precautions / Restrictions Precautions Precautions: Fall Restrictions Weight Bearing Restrictions: No      Mobility Bed Mobility               General bed mobility comments: Pt in chair on arrival.    Transfers Overall transfer level: Needs assistance Equipment used: Rolling walker (2 wheeled) Transfers: Sit to/from [facility] Sit to Stand: Supervision Stand pivot transfers: Supervision       General transfer comment: No physical assist needed and good walker safety.    Balance  Overall balance assessment: Mild deficits observed, not formally tested                                         ADL either performed or assessed with clinical judgement   ADL Overall ADL's : Needs assistance/impaired Eating/Feeding: Independent;Sitting   Grooming: Wash/dry hands;Wash/dry face;Supervision/safety;Standing   Upper Body Bathing: Set up;Sitting   Lower Body Bathing: Supervison/ safety;Sit to/from stand   Upper Body Dressing : Set up;Sitting   Lower Body Dressing: Supervision/safety;Sit to/from stand;Cueing for compensatory techniques Lower Body Dressing Details (indicate cue type and reason): used step stool to donn socks due to back surgery in Nov/2021. Toilet Transfer: Supervision/safety;Ambulation;RW;Comfort height toilet;Grab bars   Toileting- Clothing Manipulation and Hygiene: Supervision/safety;Sit to/from stand       Functional mobility during ADLs: Supervision/safety;Rolling walker General ADL Comments: Pt overall at supervision level with all adls in the room using a walker. Pt did not report dizziness and did not have LOB.  Pt's husband is available at all times to assist if needed and does most of the driving.     Vision Baseline Vision/History: 0 No visual deficits Ability to See in Adequate Light: 0 Adequate Patient Visual Report: No change from baseline Vision Assessment?: No apparent visual deficits     Perception Perception Perception Tested?: No   Praxis Praxis Praxis tested?: Within functional limits    Pertinent Vitals/Pain Pain Assessment: No/denies pain  Hand Dominance Right   Extremity/Trunk Assessment Upper Extremity Assessment Upper Extremity Assessment: Overall WFL for tasks assessed   Lower Extremity Assessment Lower Extremity Assessment: Defer to PT evaluation   Cervical / Trunk Assessment Cervical / Trunk Assessment: Normal   Communication Communication Communication: No difficulties   Cognition  Arousal/Alertness: Awake/alert Behavior During Therapy: WFL for tasks assessed/performed Overall Cognitive Status: Within Functional Limits for tasks assessed                                     General Comments  Pt with no c/o dizziness this session.    Exercises     Shoulder Instructions      Home Living Family/patient expects to be discharged to:: Private residence Living Arrangements: Spouse/significant other Available Help at Discharge: Family;Available 24 hours/day Type of Home: House Home Access: Ramped entrance     Home Layout: One level     Bathroom Shower/Tub: Tub/shower unit;Curtain   Bathroom Toilet: Standard Bathroom Accessibility: Yes   Home Equipment: Walker - 2 wheels;Grab bars - tub/shower;Grab bars - toilet;Walker - 4 wheels          Prior Functioning/Environment Level of Independence: Independent        Comments: Spouse drives most of time and available to assist if needed.        OT Problem List:        OT Treatment/Interventions:      OT Goals(Current goals can be found in the care plan section) Acute Rehab OT Goals Patient Stated Goal: to go home OT Goal Formulation: All assessment and education complete, DC therapy  OT Frequency:     Barriers to D/C:            Co-evaluation              AM-PAC OT "6 Clicks" Daily Activity     Outcome Measure Help from another person eating meals?: None Help from another person taking care of personal grooming?: None Help from another person toileting, which includes using toliet, bedpan, or urinal?: None Help from another person bathing (including washing, rinsing, drying)?: None Help from another person to put on and taking off regular upper body clothing?: None Help from another person to put on and taking off regular lower body clothing?: None 6 Click Score: 24   End of Session Equipment Utilized During Treatment: Rolling walker Nurse Communication: Mobility  status  Activity Tolerance: Patient tolerated treatment well Patient left: in chair;with call bell/phone within reach;with family/visitor present  OT Visit Diagnosis: Unsteadiness on feet (R26.81)                Time: 1000-1015 OT Time Calculation (min): 15 min Charges:  OT General Charges $OT Visit: 1 Visit OT Evaluation $OT Eval Low Complexity: 1 Low  Glenford Peers 04/11/2021, 10:29 AM

## 2021-04-12 ENCOUNTER — Other Ambulatory Visit: Payer: Self-pay

## 2021-04-12 ENCOUNTER — Ambulatory Visit: Payer: Medicare Other | Attending: Internal Medicine | Admitting: Physical Therapy

## 2021-04-12 ENCOUNTER — Other Ambulatory Visit: Payer: Self-pay | Admitting: Internal Medicine

## 2021-04-12 ENCOUNTER — Encounter: Payer: Self-pay | Admitting: Physical Therapy

## 2021-04-12 DIAGNOSIS — R2689 Other abnormalities of gait and mobility: Secondary | ICD-10-CM | POA: Diagnosis present

## 2021-04-12 DIAGNOSIS — R42 Dizziness and giddiness: Secondary | ICD-10-CM

## 2021-04-12 DIAGNOSIS — R262 Difficulty in walking, not elsewhere classified: Secondary | ICD-10-CM

## 2021-04-12 DIAGNOSIS — H8110 Benign paroxysmal vertigo, unspecified ear: Secondary | ICD-10-CM | POA: Diagnosis not present

## 2021-04-12 DIAGNOSIS — M6281 Muscle weakness (generalized): Secondary | ICD-10-CM

## 2021-04-12 NOTE — Telephone Encounter (Signed)
Prescription refill request for Pradaxa received.  Indication: Afib  Last office visit: 04/18/20 (Allred)  Weight: 86.1kg Age: 75 Scr: 1.54 (04/11/21) CrCl: 41.62ml/min  Appropriate dose and refill sent to requested pharmacy.

## 2021-04-12 NOTE — Therapy (Signed)
[facility] [place], [place], [address] Phone: [phone]   Fax:  [phone]  Physical Therapy Evaluation  Patient Details  Name: [name] MRN: [id number] Date of Birth: 1946-05-27 Referring Provider (PT): Feliz-Ortiz   Encounter Date: 04/12/2021   PT End of Session - 04/12/21 1448     Visit Number 1    Date for PT Re-Evaluation 07/13/21    Authorization Type [employer]    PT Start Time 1355    PT Stop Time 1438    PT Time Calculation (min) 43 min    Activity Tolerance Patient tolerated treatment well    Behavior During Therapy WFL for tasks assessed/performed             Past Medical History:  Diagnosis Date   Anticoagulated    Pradaxa   Bilateral leg pain    Right leg has greater pain than the left.   Coronary artery disease cardiologist-- dr Rayann Heman   a.  s/p Xience DES to RCA 04/2009;   b. TEE 2/12: EF 40%, Large PFO;  c.  Lexiscan Myoview 05/2012: EF 69%, no ischemia. LHC (05/2012):  Ostial diagonal 30-40%, proximal mid ramus intermedius 40-50%, RCA stent patent with 40-50% after stent, then 40%, distal RCA 40-50%, EF 55-65%. Medical therapy continued.;  d.  Carlton Adam Myoview (06/2013):  No ischemia, EF 83%, normal study   Full dentures    GERD (gastroesophageal reflux disease)    History of basal cell carcinoma excision    scalp   History of hiatal hernia    History of kidney stones 51 years ago   x1   History of recurrent UTIs    Hypertension    IFG (impaired fasting glucose)    MVP (mitral valve prolapse)    mild per last echo 04-03-2017 in epic   OA (osteoarthritis)    knees , fingers   PAF (paroxysmal atrial fibrillation) [facility])    cardiologist-- dr Rayann Heman   S/P ablation of atrial fibrillation 08/15/2010   S/P drug eluting coronary stent placement 04/04/2009   Spinal stenosis    Tremor    Wears glasses     Past Surgical History:  Procedure Laterality Date   ABDOMINAL EXPOSURE  N/A 05/11/2020   Procedure: ABDOMINAL EXPOSURE;  Surgeon: Rosetta Posner, MD;  Location: [place];  Service: Vascular;  Laterality: N/A;   ANAL RECTAL MANOMETRY N/A 01/11/2014   Procedure: ANAL RECTAL MANOMETRY;  Surgeon: Leighton Ruff, MD;  Location: [facility];  Service: Endoscopy;  Laterality: N/A;   APPENDECTOMY  1973   BACK SURGERY     laminectomy times 2; 2000 and 2001   BUNIONECTOMY Bilateral 2013   CARDIAC CATHETERIZATION  10-24-2010   dr allred   singl-vessel CAD with patent stent mRCA/  moderate disease mRCA beyond stent segment/  normal lvsf   CARDIAC ELECTROPHYSIOLOGY MAPPING AND ABLATION  08-15-2010  dr allred   CARDIOVASCULAR STRESS TEST  06-15-2013  dr allred   normal perfusion study/  no ischemia/  ef 83%   CARPAL TUNNEL RELEASE Right 25 years ago   CATARACT EXTRACTION W/ INTRAOCULAR LENS  IMPLANT, BILATERAL     CHOLECYSTECTOMY OPEN  1973   W/  APPENDECTOMY   CORONARY ANGIOPLASTY WITH STENT PLACEMENT  04-04-2009  dr Darnell Level brodie   PCI and DES x1 to  mRCA/  mLAD 40%, pCX 30%, pRCA 30%/  normal LVF   FLEXIBLE SIGMOIDOSCOPY N/A 07/07/2013   Procedure: [place];  Surgeon: Jeneen Rinks  Angeline Slim., MD;  Location: [facility];  Service: Endoscopy;  Laterality: N/A;  unprepped   LEFT HEART CATH AND CORONARY ANGIOGRAPHY N/A 10/26/2019   Procedure: LEFT HEART CATH AND CORONARY ANGIOGRAPHY;  Surgeon: Martinique, Peter M, MD;  Location: [place] [facility];  Service: Cardiovascular;  Laterality: N/A;   LEFT HEART CATHETERIZATION WITH CORONARY ANGIOGRAM N/A 05/09/2012   Procedure: LEFT HEART CATHETERIZATION WITH CORONARY ANGIOGRAM;  Surgeon: Hillary Bow, MD;  Location: [facility];  Service: Cardiovascular;  Laterality: N/A;   MOHS SURGERY  2009   scalp   PILONIDAL CYST EXCISION  2009;   2000;   [place] N/A 01/11/2014   Procedure: RECTAL ULTRASOUND;  Surgeon: Leighton Ruff, MD;  Location: [facility];  Service: Endoscopy;  Laterality: N/A;   TOTAL KNEE  ARTHROPLASTY Right 09/07/2014   Procedure: RIGHT TOTAL KNEE ARTHROPLASTY;  Surgeon: Paralee Cancel, MD;  Location: [facility];  Service: Orthopedics;  Laterality: Right;   TOTAL KNEE ARTHROPLASTY Left 12/20/2015   Procedure: LEFT TOTAL KNEE ARTHROPLASTY;  Surgeon: Paralee Cancel, MD;  Location: [facility];  Service: Orthopedics;  Laterality: Left;   TRANSTHORACIC ECHOCARDIOGRAM  04-03-2017   dr allred   Leory Plowman 55-60%/  mild MVP anterior leaflet (valve area 2.1cm^2) with mild regurg. , no stenosis/  trivial TR and PR   VAGINAL HYSTERECTOMY  1975   W/ BILATERAL SALPINOOPHOORECTOMY   VAGUS NERVE STIMULATOR INSERTION N/A 03/10/2014   Procedure: IMPLANTATION OF SACRAL  NERVE STIMULATOR ;  Surgeon: Leighton Ruff, MD;  Location: [facility];  Service: General;  Laterality: N/A;  Sacral. [employer]    There were no vitals filed for this visit.    Subjective Assessment - 04/12/21 1359     Subjective Patient reports that she starte having dizziness on sunday morning, the dizziness progressed, went to the ED on monday and stayed until yesterday, she was tested for CVA but CT and MRI were negative, the MD feels like it was vertigo, she is unsure of a cause, denies any issues in the past.  no falls    Pertinent History bilatreal TKA, lumbar fusion 2021    Limitations Walking;House hold activities    Patient Stated Goals have less dizziness, walk normally    Currently in Pain? No/denies                [facility] PT Assessment - 04/12/21 0001       Assessment   Medical Diagnosis vertigo, dizziness    Referring Provider (PT) Feliz-Ortiz    Onset Date/Surgical Date 04/10/21    Prior Therapy no      Precautions   Precautions None      Balance Screen   Has the patient fallen in the past 6 months No    Has the patient had a decrease in activity level because of a fear of falling?  Yes    Is the patient reluctant to leave their home because of a fear of falling?  No      Home Environment   Additional  Comments no stairs, does not do housework since the back surgery      Prior Function   Level of Independence Independent with community mobility with device   SPC   Vocation Retired    Leisure no exercise      Posture/Postural Control   Posture Comments eyes closed to help with dizziness, tilts head to the right      ROM / Strength  AROM / PROM / Strength AROM;Strength      AROM   Overall AROM Comments cervical ROM is WFL's but really causes a head shake (side to side) no nystagmus seen limited shoulder ROM      Palpation   Palpation comment very tight in the upper traps and the neck area, very tense and gaurded possibly due to movements increase the dizziness      Standardized Balance Assessment   Standardized Balance Assessment Timed Up and Go Test      Timed Up and Go Test   Normal TUG (seconds) 34    TUG Comments with SPC, also used walls to steady herself                    Vestibular Assessment - 04/12/21 0001       Symptom Behavior   Type of Dizziness  Spinning    Frequency of Dizziness constant    Duration of Dizziness 72 hours    Symptom Nature Spontaneous;Constant    Aggravating Factors Activity in general   all head motions make the dizziness worse   Relieving Factors Closing eyes;Rest   lying down on the right side     Oculomotor Exam   Gaze-induced  Age appropriate nystagmus at end range    Head shaking Horizontal Absent    Smooth Pursuits Intact   at times not quite smooth would over shoot   Saccades Hypometric;Undershoots      Oculomotor Exam-Fixation Suppressed    Ocular ROM WFL's    Right Head Impulse yes                Objective measurements completed on examination: See above findings.                PT Education - 04/12/21 1448     Education Details Seated VOR smooth pursuits, gaze stabilizatona nd saccades    Person(s) Educated Patient;Spouse    Methods Explanation;Demonstration;Verbal cues;Handout     Comprehension Verbalized understanding              PT Short Term Goals - 04/12/21 1709       PT SHORT TERM GOAL #1   Title independent with initial HEP    Time 2    Period Weeks    Status New               PT Long Term Goals - 04/12/21 1709       PT LONG TERM GOAL #1   Title report overall resolution of dizziness by 50%    Time 12    Period Weeks    Status New      PT LONG TERM GOAL #2   Title able to walk with SPC and not use walls or husband for stability    Time 12    Period Weeks    Status New      PT LONG TERM GOAL #3   Title decrese tug time to 16 seconds    Time 12    Period Weeks    Status New      PT LONG TERM GOAL #4   Title be able to go to church and or shopping    Time 12    Period Weeks    Status New                    Plan - 04/12/21 1450     Clinical Impression Statement Patient was admitted  to the hospital on Monday stayed until yesterday, main symptom was dizziness, all CT and MRI ruled out CVA, they feel it is vertigo.  She had no nystagmus, she kept her eyes closed to hep with the dizziness, she did tilt her head to the right, her head did have some shaking,she was very tight in the upper traps and the neck.  She did not want to attempt the epley today, she has difficulty with some of the VOR activities.  She could have low functioning VOR due to the decreased activity with Covid and the recent back surgery.  I would like to try epley if she allows    Stability/Clinical Decision Making Evolving/Moderate complexity    Clinical Decision Making Low    Rehab Potential Good    PT Frequency 2x / week    PT Duration 12 weeks    PT Treatment/Interventions ADLs/Self Care Home Management;Electrical Stimulation;Moist Heat;Functional mobility training;Therapeutic activities;Therapeutic exercise;Balance training;Neuromuscular re-education;Patient/family education;Vestibular;Visual/perceptual remediation/compensation;Canalith Repostioning     PT Next Visit Plan try epley if she can tolerate, work and assess balance    Consulted and Agree with Plan of Care Patient             Patient will benefit from skilled therapeutic intervention in order to improve the following deficits and impairments:  Abnormal gait, Decreased range of motion, Difficulty walking, Dizziness, Decreased activity tolerance, Decreased balance, Decreased mobility, Postural dysfunction  Visit Diagnosis: Dizziness and giddiness - Plan: PT plan of care cert/re-cert  Other abnormalities of gait and mobility - Plan: PT plan of care cert/re-cert  Difficulty in walking, not elsewhere classified - Plan: PT plan of care cert/re-cert  Muscle weakness (generalized) - Plan: PT plan of care cert/re-cert     Problem List Patient Active Problem List   Diagnosis Date Noted   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   Benign positional vertigo 04/10/2021   S/P lumbar fusion 05/11/2020   Back pain 04/05/2020   Fracture of metatarsal, closed 04/05/2020   GERD (gastroesophageal reflux disease) 04/05/2020   Insomnia 04/05/2020   Lumbar post-laminectomy syndrome 02/26/2020   Degenerative lumbar spinal stenosis 12/21/2019   Degenerative spondylolisthesis 12/21/2019   RUQ abdominal pain 05/06/2019   Chronic anticoagulation 05/06/2019   Screening for viral disease 05/06/2019   Lumbar radiculopathy 05/21/2018   S/P left TKA 12/20/2015   Obese 09/08/2014   S/P right TKA 09/07/2014   History of total knee replacement, left 09/07/2014   Congestive heart failure ([place]) 07/15/2013   Diarrhea 07/15/2012   Incontinence of feces 07/15/2012   Precordial pain 10/19/2010   EDEMA 12/16/2009   Atrial fibrillation ([place]) 08/01/2009   Disease of lung 04/28/2009   Elevated fasting glucose 04/27/2009   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 [place], [place], [address] Phone: [phone]   Fax:  [phone]  Name: [name] MRN: [id number] Date of Birth: [birth year]

## 2021-04-18 ENCOUNTER — Other Ambulatory Visit: Payer: Self-pay

## 2021-04-18 ENCOUNTER — Ambulatory Visit: Payer: Medicare Other | Admitting: Physical Therapy

## 2021-04-18 DIAGNOSIS — R42 Dizziness and giddiness: Secondary | ICD-10-CM | POA: Diagnosis not present

## 2021-04-18 DIAGNOSIS — R2689 Other abnormalities of gait and mobility: Secondary | ICD-10-CM

## 2021-04-18 DIAGNOSIS — M6281 Muscle weakness (generalized): Secondary | ICD-10-CM

## 2021-04-18 DIAGNOSIS — R262 Difficulty in walking, not elsewhere classified: Secondary | ICD-10-CM

## 2021-04-18 NOTE — Therapy (Signed)
[facility] [place], [place], [address] Phone: [phone]   Fax:  [phone]  Physical Therapy Treatment and Discharge  Patient Details  Name: [name] MRN: [id number] Date of Birth: 1946-06-29 Referring Provider (PT): Feliz-Ortiz  PHYSICAL THERAPY DISCHARGE SUMMARY  Visits from Start of Care: 2  Current functional level related to goals / functional outcomes: See below   Remaining deficits: Balance deficits; however, pt does not wish to continue PT to address this   Education / Equipment: See below   Patient agrees to discharge. Patient goals were met. Patient is being discharged due to being pleased with the current functional level.   Encounter Date: 04/18/2021   PT End of Session - 04/18/21 1037     Visit Number 2    Date for PT Re-Evaluation 07/13/21    Authorization Type [employer]    PT Start Time 1020    PT Stop Time 1037    PT Time Calculation (min) 17 min    Activity Tolerance Patient tolerated treatment well    Behavior During Therapy WFL for tasks assessed/performed             Past Medical History:  Diagnosis Date   Anticoagulated    Pradaxa   Bilateral leg pain    Right leg has greater pain than the left.   Coronary artery disease cardiologist-- dr Rayann Heman   a.  s/p Xience DES to RCA 04/2009;   b. TEE 2/12: EF 40%, Large PFO;  c.  Lexiscan Myoview 05/2012: EF 69%, no ischemia. LHC (05/2012):  Ostial diagonal 30-40%, proximal mid ramus intermedius 40-50%, RCA stent patent with 40-50% after stent, then 40%, distal RCA 40-50%, EF 55-65%. Medical therapy continued.;  d.  Carlton Adam Myoview (06/2013):  No ischemia, EF 83%, normal study   Full dentures    GERD (gastroesophageal reflux disease)    History of basal cell carcinoma excision    scalp   History of hiatal hernia    History of kidney stones 51 years ago   x1   History of recurrent UTIs    Hypertension    IFG (impaired  fasting glucose)    MVP (mitral valve prolapse)    mild per last echo 04-03-2017 in epic   OA (osteoarthritis)    knees , fingers   PAF (paroxysmal atrial fibrillation) [facility])    cardiologist-- dr Rayann Heman   S/P ablation of atrial fibrillation 08/15/2010   S/P drug eluting coronary stent placement 04/04/2009   Spinal stenosis    Tremor    Wears glasses     Past Surgical History:  Procedure Laterality Date   ABDOMINAL EXPOSURE N/A 05/11/2020   Procedure: ABDOMINAL EXPOSURE;  Surgeon: Rosetta Posner, MD;  Location: [facility];  Service: Vascular;  Laterality: N/A;   ANAL RECTAL MANOMETRY N/A 01/11/2014   Procedure: ANAL RECTAL MANOMETRY;  Surgeon: Leighton Ruff, MD;  [facility];  Service: Endoscopy;  Laterality: N/A;   APPENDECTOMY  1973   BACK SURGERY     laminectomy times 2; 2000 and 2001   BUNIONECTOMY Bilateral 2013   CARDIAC CATHETERIZATION  10-24-2010   dr allred   singl-vessel CAD with patent stent mRCA/  moderate disease mRCA beyond stent segment/  normal lvsf   CARDIAC ELECTROPHYSIOLOGY MAPPING AND ABLATION  08-15-2010  dr allred   CARDIOVASCULAR STRESS TEST  06-15-2013  dr allred   normal perfusion study/  no ischemia/  ef 83%   CARPAL TUNNEL RELEASE  Right 25 years ago   CATARACT EXTRACTION W/ INTRAOCULAR LENS  IMPLANT, BILATERAL     CHOLECYSTECTOMY OPEN  1973   W/  APPENDECTOMY   CORONARY ANGIOPLASTY WITH STENT PLACEMENT  04-04-2009  dr Darnell Level brodie   PCI and DES x1 to  mRCA/  mLAD 40%, pCX 30%, pRCA 30%/  normal LVF   FLEXIBLE SIGMOIDOSCOPY N/A 07/07/2013   Procedure: FLEXIBLE SIGMOIDOSCOPY;  Surgeon: Winfield Cunas., MD;  [facility];  Service: Endoscopy;  Laterality: N/A;  unprepped   LEFT HEART CATH AND CORONARY ANGIOGRAPHY N/A 10/26/2019   Procedure: LEFT HEART CATH AND CORONARY ANGIOGRAPHY;  Surgeon: Martinique, Peter M, MD;  Location: [facility];  Service: Cardiovascular;  Laterality: N/A;   LEFT HEART CATHETERIZATION WITH CORONARY ANGIOGRAM  N/A 05/09/2012   Procedure: LEFT HEART CATHETERIZATION WITH CORONARY ANGIOGRAM;  Surgeon: Hillary Bow, MD;  Location: [facility] CATH LAB;  Service: Cardiovascular;  Laterality: N/A;   MOHS SURGERY  2009   scalp   PILONIDAL CYST EXCISION  2009;   2000;   [place] N/A 01/11/2014   Procedure: RECTAL ULTRASOUND;  Surgeon: Leighton Ruff, MD;  [facility];  Service: Endoscopy;  Laterality: N/A;   TOTAL KNEE ARTHROPLASTY Right 09/07/2014   Procedure: RIGHT TOTAL KNEE ARTHROPLASTY;  Surgeon: Paralee Cancel, MD;  [facility];  Service: Orthopedics;  Laterality: Right;   TOTAL KNEE ARTHROPLASTY Left 12/20/2015   Procedure: LEFT TOTAL KNEE ARTHROPLASTY;  Surgeon: Paralee Cancel, MD;  [facility];  Service: Orthopedics;  Laterality: Left;   TRANSTHORACIC ECHOCARDIOGRAM  04-03-2017   dr allred   Leory Plowman 55-60%/  mild MVP anterior leaflet (valve area 2.1cm^2) with mild regurg. , no stenosis/  trivial TR and PR   VAGINAL HYSTERECTOMY  1975   W/ BILATERAL SALPINOOPHOORECTOMY   VAGUS NERVE STIMULATOR INSERTION N/A 03/10/2014   Procedure: IMPLANTATION OF SACRAL  NERVE STIMULATOR ;  Surgeon: Leighton Ruff, MD;  Location: [facility];  Service: General;  Laterality: N/A;  Sacral. [employer]    There were no vitals filed for this visit.   Subjective Assessment - 04/18/21 1023     Subjective Pt reports she hasn't had any problems with dizziness.    Pertinent History bilatreal TKA, lumbar fusion 2021    Limitations Walking;House hold activities    Patient Stated Goals have less dizziness, walk normally    Currently in Pain? No/denies                [facility] PT Assessment - 04/18/21 0001       Timed Up and Go Test   Normal TUG (seconds) 13    TUG Comments no a/d                 Vestibular Assessment - 04/18/21 0001       Oculomotor Exam   Gaze-induced  Age appropriate nystagmus at end range    Saccades Hypometric;Undershoots      Oculomotor  Exam-Fixation Suppressed    Ocular ROM WFL    Left Head Impulse --   delayed but asymptomatic   Right Head Impulse --   delayed but asymptomatic     Positional Testing   Dix-Hallpike Dix-Hallpike Right;Dix-Hallpike Left    Sidelying Test Sidelying Right;Sidelying Left      Dix-Hallpike Right   Dix-Hallpike Right Duration 0      Dix-Hallpike Left   Dix-Hallpike Left Duration 0      Sidelying Right  Sidelying Right Duration 0      Sidelying Left   Sidelying Left Duration 0                                 PT Short Term Goals - 04/12/21 1709       PT SHORT TERM GOAL #1   Title independent with initial HEP    Time 2    Period Weeks    Status New               PT Long Term Goals - 04/18/21 1032       PT LONG TERM GOAL #1   Title report overall resolution of dizziness by 50%    Time 12    Period Weeks    Status Achieved      PT LONG TERM GOAL #2   Title able to walk with SPC and not use walls or husband for stability    Time 12    Period Weeks    Status Achieved      PT LONG TERM GOAL #3   Title decrese tug time to 16 seconds    Baseline 13 sec    Time 12    Period Weeks    Status Achieved      PT LONG TERM GOAL #4   Title be able to go to church and or shopping    Time 12    Period Weeks    Status Achieved                   Plan - 04/18/21 1039     Clinical Impression Statement Pt reports she feels back to her baseline. Pt has not had any issues with dizziness in the last few days. She feels she is walking as she needs to. PT rechecked pt's LTGs and pt has met all of them at this time. Performed Epley maneuver and sidelying test; no s/s of BPPV noted. Pt feels she has no more PT needs. Discussed with pt that she has some gait/balance instability but she does not feel she needs PT to address this.    Stability/Clinical Decision Making Evolving/Moderate complexity    Rehab Potential Good    PT Frequency 2x / week     PT Duration 12 weeks    PT Treatment/Interventions ADLs/Self Care Home Management;Electrical Stimulation;Moist Heat;Functional mobility training;Therapeutic activities;Therapeutic exercise;Balance training;Neuromuscular re-education;Patient/family education;Vestibular;Visual/perceptual remediation/compensation;Canalith Repostioning    PT Next Visit Plan n/a    Consulted and Agree with Plan of Care Patient             Patient will benefit from skilled therapeutic intervention in order to improve the following deficits and impairments:  Abnormal gait, Decreased range of motion, Difficulty walking, Dizziness, Decreased activity tolerance, Decreased balance, Decreased mobility, Postural dysfunction  Visit Diagnosis: Dizziness and giddiness  Other abnormalities of gait and mobility  Difficulty in walking, not elsewhere classified  Muscle weakness (generalized)     Problem List Patient Active Problem List   Diagnosis Date Noted   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   Benign positional vertigo 04/10/2021   S/P lumbar fusion 05/11/2020   Back pain 04/05/2020   Fracture of metatarsal, closed 04/05/2020   GERD (gastroesophageal reflux disease) 04/05/2020   Insomnia 04/05/2020   Lumbar post-laminectomy syndrome 02/26/2020   Degenerative lumbar spinal stenosis 12/21/2019   Degenerative spondylolisthesis 12/21/2019   RUQ abdominal pain 05/06/2019   Chronic anticoagulation  05/06/2019   Screening for viral disease 05/06/2019   Lumbar radiculopathy 05/21/2018   S/P left TKA 12/20/2015   Obese 09/08/2014   S/P right TKA 09/07/2014   History of total knee replacement, left 09/07/2014   Congestive heart failure ([place]) 07/15/2013   Diarrhea 07/15/2012   Incontinence of feces 07/15/2012   Precordial pain 10/19/2010   EDEMA 12/16/2009   Atrial fibrillation ([place]) 08/01/2009   Disease of lung 04/28/2009   Elevated fasting glucose 04/27/2009   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 [place], [place], [address] Phone: [phone]   Fax:  [phone]  Name: NITZA SCHMID MRN: [id number] Date of Birth: [birth year]

## 2021-04-26 ENCOUNTER — Ambulatory Visit: Payer: Medicare Other | Admitting: Physical Therapy

## 2021-05-03 ENCOUNTER — Ambulatory Visit: Payer: Medicare Other | Admitting: Physical Therapy

## 2021-05-10 ENCOUNTER — Ambulatory Visit: Payer: Medicare Other | Admitting: Physical Therapy

## 2021-05-17 ENCOUNTER — Ambulatory Visit: Payer: Medicare Other | Admitting: Physical Therapy

## 2021-05-18 ENCOUNTER — Other Ambulatory Visit: Payer: Self-pay | Admitting: Internal Medicine

## 2021-05-24 ENCOUNTER — Ambulatory Visit: Payer: Medicare Other | Admitting: Physical Therapy

## 2021-05-31 ENCOUNTER — Ambulatory Visit: Payer: Medicare Other | Admitting: Physical Therapy

## 2021-06-06 ENCOUNTER — Other Ambulatory Visit: Payer: Self-pay | Admitting: Internal Medicine

## 2021-06-19 ENCOUNTER — Other Ambulatory Visit: Payer: Self-pay | Admitting: Internal Medicine

## 2021-07-24 ENCOUNTER — Other Ambulatory Visit: Payer: Self-pay | Admitting: Internal Medicine

## 2021-08-15 ENCOUNTER — Other Ambulatory Visit: Payer: Self-pay | Admitting: Internal Medicine

## 2021-10-17 NOTE — Progress Notes (Signed)
? ?PCP:  Fransisca Connors, PA-C ?Primary Cardiologist: None - Previously followed intermittently with Truitt Merle ?Electrophysiologist: Dr. Rayann Heman ? ?[name] is a 76 y.o. female seen today for Dr. Rayann Heman for routine electrophysiology followup.  Since last being seen in our clinic the patient reports doing very well.  she denies chest pain, palpitations, dyspnea, PND, orthopnea, nausea, vomiting, dizziness, syncope, edema, weight gain, or early satiety. ? ?Past Medical History:  ?Diagnosis Date  ? Anticoagulated   ? Pradaxa  ? Bilateral leg pain   ? Right leg has greater pain than the left.  ? Coronary artery disease cardiologist-- dr Rayann Heman  ? a.  s/p Xience DES to RCA 04/2009;   b. TEE 2/12: EF 40%, Large PFO;  c.  Lexiscan Myoview 05/2012: EF 69%, no ischemia. LHC (05/2012):  Ostial diagonal 30-40%, proximal mid ramus intermedius 40-50%, RCA stent patent with 40-50% after stent, then 40%, distal RCA 40-50%, EF 55-65%. Medical therapy continued.;  d.  Carlton Adam Myoview (06/2013):  No ischemia, EF 83%, normal study  ? Full dentures   ? GERD (gastroesophageal reflux disease)   ? History of basal cell carcinoma excision   ? scalp  ? History of hiatal hernia   ? History of kidney stones 51 years ago  ? x1  ? History of recurrent UTIs   ? Hypertension   ? IFG (impaired fasting glucose)   ? MVP (mitral valve prolapse)   ? mild per last echo 04-03-2017 in epic  ? OA (osteoarthritis)   ? knees , fingers  ? PAF (paroxysmal atrial fibrillation) ([place])   ? cardiologist-- dr Rayann Heman  ? S/P ablation of atrial fibrillation 08/15/2010  ? S/P drug eluting coronary stent placement 04/04/2009  ? Spinal stenosis   ? Tremor   ? Wears glasses   ? ?Past Surgical History:  ?Procedure Laterality Date  ? ABDOMINAL EXPOSURE N/A 05/11/2020  ? Procedure: ABDOMINAL EXPOSURE;  Surgeon: Rosetta Posner, MD;  [facility];  Service: Vascular;  Laterality: N/A;  ? ANAL RECTAL MANOMETRY N/A 01/11/2014  ? Procedure: ANAL RECTAL MANOMETRY;   Surgeon: Leighton Ruff, MD;  [facility];  Service: Endoscopy;  Laterality: N/A;  ? APPENDECTOMY  1973  ? BACK SURGERY    ? laminectomy times 2; 2000 and 2001  ? BUNIONECTOMY Bilateral 2013  ? CARDIAC CATHETERIZATION  10-24-2010   dr allred  ? singl-vessel CAD with patent stent mRCA/  moderate disease mRCA beyond stent segment/  normal lvsf  ? CARDIAC ELECTROPHYSIOLOGY MAPPING AND ABLATION  08-15-2010  dr allred  ? CARDIOVASCULAR STRESS TEST  06-15-2013  dr allred  ? normal perfusion study/  no ischemia/  ef 83%  ? CARPAL TUNNEL RELEASE Right 25 years ago  ? CATARACT EXTRACTION W/ INTRAOCULAR LENS  IMPLANT, BILATERAL    ? CHOLECYSTECTOMY OPEN  1973  ? W/  APPENDECTOMY  ? CORONARY ANGIOPLASTY WITH STENT PLACEMENT  04-04-2009  dr Darnell Level brodie  ? PCI and DES x1 to  mRCA/  mLAD 40%, pCX 30%, pRCA 30%/  normal LVF  ? FLEXIBLE SIGMOIDOSCOPY N/A 07/07/2013  ? Procedure: FLEXIBLE SIGMOIDOSCOPY;  Surgeon: Winf[place] Cunas., MD;  Location: [facility];  Service: Endoscopy;  Laterality: N/A;  unprepped  ? LEFT HEART CATH AND CORONARY ANGIOGRAPHY N/A 10/26/2019  ? Procedure: LEFT HEART CATH AND CORONARY ANGIOGRAPHY;  Surgeon: Martinique, Peter M, MD;  Location: [facility];  Service: Cardiovascular;  Laterality: N/A;  ? LEFT HEART CATHETERIZATION WITH CORONARY ANGIOGRAM N/A  05/09/2012  ? Procedure: LEFT HEART CATHETERIZATION WITH CORONARY ANGIOGRAM;  Surgeon: Hillary Bow, MD;  Location: [facility] CATH LAB;  Service: Cardiovascular;  Laterality: N/A;  ? MOHS SURGERY  2009  ? scalp  ? PILONIDAL CYST EXCISION  2009;   2000;   1999  ? RECTAL ULTRASOUND N/A 01/11/2014  ? Procedure: RECTAL ULTRASOUND;  Surgeon: Leighton Ruff, MD;  [facility];  Service: Endoscopy;  Laterality: N/A;  ? TOTAL KNEE ARTHROPLASTY Right 09/07/2014  ? Procedure: RIGHT TOTAL KNEE ARTHROPLASTY;  Surgeon: Paralee Cancel, MD;  [facility];  Service: Orthopedics;  Laterality: Right;  ? TOTAL KNEE ARTHROPLASTY Left 12/20/2015  ? Procedure:  LEFT TOTAL KNEE ARTHROPLASTY;  Surgeon: Paralee Cancel, MD;  [facility];  Service: Orthopedics;  Laterality: Left;  ? TRANSTHORACIC ECHOCARDIOGRAM  04-03-2017   dr allred  ? ef 55-60%/  mild MVP anterior leaflet (valve area 2.1cm^2) with mild regurg. , no stenosis/  trivial TR and PR  ? VAGINAL HYSTERECTOMY  1975  ? W/ BILATERAL SALPINOOPHOORECTOMY  ? VAGUS NERVE STIMULATOR INSERTION N/A 03/10/2014  ? Procedure: IMPLANTATION OF SACRAL  NERVE STIMULATOR ;  Surgeon: Leighton Ruff, MD;  Location: [facility];  Service: General;  Laterality: N/A;  Sacral. [employer]  ? ? ?Current Outpatient Medications  ?Medication Sig Dispense Refill  ? diphenhydrAMINE (BENADRYL) 25 mg capsule Take 25 mg by mouth every 6 (six) hours as needed.    ? gabapentin (NEURONTIN) 300 MG capsule Take 300 mg by mouth in the morning and at bedtime.    ? hydrochlorothiazide (HYDRODIURIL) 12.5 MG tablet Take 1 tablet (12.5 mg total) by mouth daily. 30 tablet 11  ? isosorbide mononitrate (IMDUR) 30 MG 24 hr tablet Take 30 mg by mouth daily.     ? lisinopril (PRINIVIL,ZESTRIL) 10 MG tablet Take 1 tablet (10 mg total) by mouth daily. 90 tablet 1  ? metoprolol succinate (TOPROL-XL) 25 MG 24 hr tablet Take 1 tablet (25 mg total) by mouth 2 (two) times daily. Please make overdue appt with Dr. Rayann Heman before anymore refills. Thank you 1st attempt 60 tablet 0  ? nitroGLYCERIN (NITROSTAT) 0.4 MG SL tablet DISSOLVE 1 TABLET UNDER THE TONGUE EVERY 5 MINUTES AS NEEDED FOR CHEST PAIN (Patient taking differently: Place 0.4 mg under the tongue every 5 (five) minutes as needed for chest pain.) 75 tablet 3  ? pantoprazole (PROTONIX) 40 MG tablet Take 1 tablet (40 mg total) by mouth 2 (two) times daily. NEEDS VISIT FOR FURTHER REFILLS 180 tablet 0  ? PRADAXA 150 MG CAPS capsule TAKE 1 CAPSULE TWICE A DAY 180 capsule 3  ? primidone (MYSOLINE) 50 MG tablet Take 2 tablets (100 mg total) by mouth at bedtime. 180 tablet 3  ? rosuvastatin (CRESTOR) 40 MG  tablet Take 1 tablet (40 mg total) by mouth daily. Please call to schedule appointment for future refills. Final Attempt 15 tablet 0  ? traZODone (DESYREL) 50 MG tablet Take 150 mg by mouth at bedtime as needed.    ? ?No current facility-administered medications for this visit.  ? ? ?Allergies  ?Allergen Reactions  ? Cephalexin Nausea Only  ? ? ?Social History  ? ?Socioeconomic History  ? Marital status: Married  ?  Spouse name: Not on file  ? Number of children: 2  ? Years of education: HS  ? Highest education level: Not on file  ?Occupational History  ? Occupation: retired  ?  Employer: RETIRED  ?Tobacco Use  ? Smoking status: Former  ?  Packs/day: 0.50  ?  Years: 20.00  ?  Pack years: 10.00  ?  Types: Cigarettes  ?  Quit date: 07/02/1988  ?  Years since quitting: 33.3  ? Smokeless tobacco: Never  ?Vaping Use  ? Vaping Use: Never used  ?Substance and Sexual Activity  ? Alcohol use: No  ? Drug use: Never  ? Sexual activity: Not Currently  ?  Birth control/protection: Surgical  ?  Comment: 1st intercourse 38 yo-1 partner  ?Other Topics Concern  ? Not on file  ?Social History Narrative  ? Not on file  ? ?Social Determinants of Health  ? ?Financial Resource Strain: Not on file  ?Food Insecurity: Not on file  ?Transportation Needs: Not on file  ?Physical Activity: Not on file  ?Stress: Not on file  ?Social Connections: Not on file  ?Intimate Partner Violence: Not on file  ? ? ? ?Review of Systems: ?All other systems reviewed and are otherwise negative except as noted above. ? ?Physical Exam: ?Vitals:  ? 10/24/21 1000  ?BP: (!) 110/56  ?Pulse: 64  ?Weight: 198 lb (89.8 kg)  ?Height: '5\' 1"'$  (1.549 m)  ? ? ?GEN- The patient is well appearing, alert and oriented x 3 today.   ?HEENT: normocephalic, atraumatic; sclera clear, conjunctiva pink; hearing intact; oropharynx clear; neck supple, no JVP ?Lymph- no cervical lymphadenopathy ?Lungs- Clear to ausculation bilaterally, normal work of breathing.  No wheezes, rales,  rhonchi ?Heart- Regular rate and rhythm, no murmurs, rubs or gallops, PMI not laterally displaced ?GI- soft, non-tender, non-distended, bowel sounds present, no hepatosplenomegaly ?Extremities- no clubbing, cyanosis,

## 2021-10-24 ENCOUNTER — Encounter: Payer: Self-pay | Admitting: Student

## 2021-10-24 ENCOUNTER — Ambulatory Visit (INDEPENDENT_AMBULATORY_CARE_PROVIDER_SITE_OTHER): Payer: Medicare Other | Admitting: Student

## 2021-10-24 VITALS — BP 110/56 | HR 64 | Ht 61.0 in | Wt 198.0 lb

## 2021-10-24 DIAGNOSIS — I480 Paroxysmal atrial fibrillation: Secondary | ICD-10-CM | POA: Diagnosis not present

## 2021-10-24 DIAGNOSIS — I2510 Atherosclerotic heart disease of native coronary artery without angina pectoris: Secondary | ICD-10-CM

## 2021-10-24 DIAGNOSIS — I10 Essential (primary) hypertension: Secondary | ICD-10-CM

## 2021-10-24 LAB — CBC
Hematocrit: 30.7 % — ABNORMAL LOW (ref 34.0–46.6)
Hemoglobin: 9.9 g/dL — ABNORMAL LOW (ref 11.1–15.9)
MCH: 28.2 pg (ref 26.6–33.0)
MCHC: 32.2 g/dL (ref 31.5–35.7)
MCV: 88 fL (ref 79–97)
Platelets: 204 10*3/uL (ref 150–450)
RBC: 3.51 x10E6/uL — ABNORMAL LOW (ref 3.77–5.28)
RDW: 16.7 % — ABNORMAL HIGH (ref 11.7–15.4)
WBC: 6.5 10*3/uL (ref 3.4–10.8)

## 2021-10-24 LAB — BASIC METABOLIC PANEL
BUN/Creatinine Ratio: 16 (ref 12–28)
BUN: 39 mg/dL — ABNORMAL HIGH (ref 8–27)
CO2: 19 mmol/L — ABNORMAL LOW (ref 20–29)
Calcium: 9.2 mg/dL (ref 8.7–10.3)
Chloride: 99 mmol/L (ref 96–106)
Creatinine, Ser: 2.42 mg/dL — ABNORMAL HIGH (ref 0.57–1.00)
Glucose: 101 mg/dL — ABNORMAL HIGH (ref 70–99)
Potassium: 4.4 mmol/L (ref 3.5–5.2)
Sodium: 135 mmol/L (ref 134–144)
eGFR: 20 mL/min/{1.73_m2} — ABNORMAL LOW (ref >59)

## 2021-10-24 NOTE — Patient Instructions (Signed)
Medication Instructions:  ?Your physician recommends that you continue on your current medications as directed. Please refer to the Current Medication list given to you today. ? ?*If you need a refill on your cardiac medications before your next appointment, please call your pharmacy* ? ? ?Lab Work: ?TODAY: BMET, CBC ? ?If you have labs (blood work) drawn today and your tests are completely normal, you will receive your results only by: ?MyChart Message (if you have MyChart) OR ?A paper copy in the mail ?If you have any lab test that is abnormal or we need to change your treatment, we will call you to review the results. ? ? ?Follow-Up: ?At [facility], you and your health needs are our priority.  As part of our continuing mission to provide you with exceptional heart care, we have created designated Provider Care Teams.  These Care Teams include your primary Cardiologist (physician) and Advanced Practice Providers (APPs -  Physician Assistants and Nurse Practitioners) who all work together to provide you with the care you need, when you need it. ? ? ?Your next appointment:   ?6 months with General Cardiologist ?1 year(s) ? ?The format for your next appointment:   ?In Person ? ?Provider:   ?Allegra Lai, MD{ ? ?

## 2021-11-07 ENCOUNTER — Encounter: Payer: Self-pay | Admitting: *Deleted

## 2022-02-26 ENCOUNTER — Telehealth: Payer: Self-pay | Admitting: Internal Medicine

## 2022-02-26 NOTE — Telephone Encounter (Signed)
Pts appt moved up to 9/14'@1'$ :30pm with [facility], pt aware.

## 2022-02-26 NOTE — Telephone Encounter (Signed)
Patient called to schedule an appointment in response to a referral from her PCP to this office for anemia with a concern for an UGI bleed.  Patient was unaware she had a problem, but scheduled an appointment with Estill Bamberg 9/28 at 3 p.m. and wants to know if she should be seen sooner rather than later or what she should be doing between now and that appointment date.  Please call patient and advise.  Thank you.

## 2022-03-15 ENCOUNTER — Encounter: Payer: Self-pay | Admitting: Nurse Practitioner

## 2022-03-15 ENCOUNTER — Other Ambulatory Visit (INDEPENDENT_AMBULATORY_CARE_PROVIDER_SITE_OTHER): Payer: Medicare Other

## 2022-03-15 ENCOUNTER — Ambulatory Visit (INDEPENDENT_AMBULATORY_CARE_PROVIDER_SITE_OTHER): Payer: Medicare Other | Admitting: Nurse Practitioner

## 2022-03-15 VITALS — BP 124/76 | HR 75 | Ht 61.0 in | Wt 195.0 lb

## 2022-03-15 DIAGNOSIS — N189 Chronic kidney disease, unspecified: Secondary | ICD-10-CM

## 2022-03-15 DIAGNOSIS — D649 Anemia, unspecified: Secondary | ICD-10-CM

## 2022-03-15 DIAGNOSIS — I519 Heart disease, unspecified: Secondary | ICD-10-CM

## 2022-03-15 LAB — COMPREHENSIVE METABOLIC PANEL
ALT: 9 U/L (ref 0–35)
AST: 14 U/L (ref 0–37)
Albumin: 3.8 g/dL (ref 3.5–5.2)
Alkaline Phosphatase: 62 U/L (ref 39–117)
BUN: 21 mg/dL (ref 6–23)
CO2: 27 mEq/L (ref 19–32)
Calcium: 8.6 mg/dL (ref 8.4–10.5)
Chloride: 103 mEq/L (ref 96–112)
Creatinine, Ser: 1.62 mg/dL — ABNORMAL HIGH (ref 0.40–1.20)
GFR: 30.33 mL/min — ABNORMAL LOW (ref >60.00)
Glucose, Bld: 112 mg/dL — ABNORMAL HIGH (ref 70–99)
Potassium: 3.9 mEq/L (ref 3.5–5.1)
Sodium: 138 mEq/L (ref 135–145)
Total Bilirubin: 0.3 mg/dL (ref 0.2–1.2)
Total Protein: 6.3 g/dL (ref 6.0–8.3)

## 2022-03-15 LAB — IBC + FERRITIN
Ferritin: 9.2 ng/mL — ABNORMAL LOW (ref 10.0–291.0)
Iron: 31 ug/dL — ABNORMAL LOW (ref 42–145)
Saturation Ratios: 7.7 % — ABNORMAL LOW (ref 20.0–50.0)
TIBC: 401.8 ug/dL (ref 250.0–450.0)
Transferrin: 287 mg/dL (ref 212.0–360.0)

## 2022-03-15 LAB — CBC
HCT: 28.3 % — ABNORMAL LOW (ref 36.0–46.0)
Hemoglobin: 9.1 g/dL — ABNORMAL LOW (ref 12.0–15.0)
MCHC: 32 g/dL (ref 30.0–36.0)
MCV: 81.3 fl (ref 78.0–100.0)
Platelets: 221 10*3/uL (ref 150.0–400.0)
RBC: 3.48 Mil/uL — ABNORMAL LOW (ref 3.87–5.11)
RDW: 17.6 % — ABNORMAL HIGH (ref 11.5–15.5)
WBC: 5.1 10*3/uL (ref 4.0–10.5)

## 2022-03-15 LAB — FOLATE: Folate: 9.7 ng/mL (ref >5.9)

## 2022-03-15 LAB — VITAMIN B12: Vitamin B-12: 165 pg/mL — ABNORMAL LOW (ref 211–911)

## 2022-03-15 NOTE — Patient Instructions (Signed)
_______________________________________________________  If you are age 78 or older, your body mass index should be between 23-30. Your Body mass index is 36.84 kg/m. If this is out of the aforementioned range listed, please consider follow up with your Primary Care Provider.  If you are age 77 or younger, your body mass index should be between 19-25. Your Body mass index is 36.84 kg/m. If this is out of the aformentioned range listed, please consider follow up with your Primary Care Provider.   Your provider has requested that you go to the basement level for lab work before leaving today. Press "B" on the elevator. The lab is located at the first door on the left as you exit the elevator.  Our office will contact you regarding future GI evaluations after labs are reviewed.  Follow up with your Primary care provider regarding Kidney disease.  The [facility] providers would like to encourage you to use [facility] to communicate with providers for non-urgent requests or questions.  Due to long hold times on the telephone, sending your provider a message by [facility] may be a faster and more efficient way to get a response.  Please allow 48 business hours for a response.  Please remember that this is for non-urgent requests.   Due to recent changes in healthcare laws, you may see the results of your imaging and laboratory studies on MyChart before your provider has had a chance to review them.  We understand that in some cases there may be results that are confusing or concerning to you. Not all laboratory results come back in the same time frame and the provider may be waiting for multiple results in order to interpret others.  Please give [place] 48 hours in order for your provider to thoroughly review all the results before contacting the office for clarification of your results.   It was a pleasure to see you today!  Thank you for trusting me with your gastrointestinal care!

## 2022-03-15 NOTE — Progress Notes (Addendum)
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  Past cholecystectomy and appendectomy.  She is followed by Dr. Hilarie Fredrickson.  She presents to our office today as referred by Earley Brooke, PA-C for further evaluation regarding anemia and to consider endoscopic evaluation.  She denies having any dysphagia, heartburn or upper abdominal pain.  She takes Pantoprazole 40 mg twice daily.  She is passing a normal formed brown bowel movement every other day.  No rectal bleeding or black stools.  No fever, sweats or chills.  No weight loss.  She underwent an EGD 05/12/2019 by Dr. Hilarie Fredrickson which was normal.  She underwent a sigmoidoscopy by Dr. Oletta Lamas in 2015 which was normal and a colonoscopy in 2013 by [place] which was normal. She has a history of atrial fibrillation and is on Pradaxa. History of CAD s/p DES in 2010. No longer on ASA. She denies NSAID use.   Her most recent laboratory studies 10/24/2021 per her cardiology team identified a WBC count of 6.5, Hg 9.9  and HCT 30.7.  Creatinine 2.42.  BUN 39. I reviewed these labs with [name] at this time. She stated having "anemia all of my life".  She denied any knowledge of having kidney disease.  No known family history of hereditary anemia, kidney disease or GI malignancy.  She denies having any chest pain, palpitations or shortness of breath.     Latest Ref Rng & Units 10/24/2021   10:29 AM 04/10/2021   11:10 AM 04/10/2021   10:35 AM  CBC  WBC 3.4 - 10.8 x10E3/uL 6.5   10.2   Hemoglobin 11.1 - 15.9 g/dL 9.9  11.9  11.6   Hematocrit 34.0 - 46.6 % 30.7  35.0  35.1   Platelets 150 - 450 x10E3/uL 204   147    MCV  88.   Hg level 06/2018 - 05/2020: Hg 12.2 -> 14 -> 13.6 -> 10 -> 11.6       Latest Ref Rng & Units 10/24/2021   10:29 AM 04/11/2021    3:11 AM 04/10/2021   11:10 AM  CMP  Glucose 70 - 99 mg/dL 101  107  101   BUN 8 - 27 mg/dL 39  34  35   Creatinine 0.57 - 1.00 mg/dL 2.42  1.54  1.80   Sodium 134 - 144 mmol/L 135  130  128   Potassium 3.5 - 5.2 mmol/L 4.4  4.4  4.6   Chloride 96 - 106 mmol/L 99  98  95   CO2 20 - 29 mmol/L 19  24    Calcium 8.7 - 10.3 mg/dL 9.2  8.6      Labs 01/23/2021: Creatinine 1.06.  BUN 19.     Latest Ref Rng & Units 04/10/2021   10:35 AM 10/21/2019   10:21 AM 07/15/2013    8:36 AM  Hepatic Function  Total Protein 6.5 - 8.1 g/dL 5.5  6.8  6.6   Albumin 3.5 - 5.0 g/dL 3.2  4.7  4.1   AST 15 - 41 U/L $Remo'17  15  17   'AvLKp$ ALT 0 - 44 U/L $Remo'12  11  12   'xXCph$ Alk Phosphatase 38 -  126 U/L 49  81  56   Total Bilirubin 0.3 - 1.2 mg/dL 0.8  0.3  0.3   Bilirubin, Direct 0.0 - 0.3 mg/dL   <0.2     Cardiac catheterization 10/26/2019: Ramus lesion is 35% stenosed. Prox RCA to Mid RCA lesion is 25% stenosed. Previously placed Mid RCA-1 stent (unknown type) is widely patent. Mid RCA-2 lesion is 25% stenosed. The left ventricular systolic function is normal. LV end diastolic pressure is normal. The left ventricular ejection fraction is 55-65% by visual estimate.  1. Nonobstructive CAD.  2. Normal LV function 3. Normal LVEDP  ECHO 11/05/2019: 1. Left ventricular ejection fraction, by estimation, is 55 to 60%. The left ventricle has normal function. The left ventricle has no regional wall motion abnormalities. Left ventricular diastolic parameters are consistent with Grade I diastolic dysfunction (impaired relaxation). 2. Right ventricular systolic function is normal. The right ventricular size is normal. There is normal pulmonary artery systolic pressure. 3. The mitral valve is normal in structure. Mild mitral valve regurgitation. No evidence of mitral stenosis. 4. The aortic  valve is normal in structure. Aortic valve regurgitation is not visualized. No aortic stenosis is present.  PAST GI PROCEDURES:   EGD 05/12/2019 by Dr. Hilarie Fredrickson: - The examined esophagus was normal. - The entire examined stomach was normal. - The examined duodenum was normal.  Sigmoidoscopy by Dr. Oletta Lamas 07/07/2013: Normal  04/17/2012 by Dr. Janus Molder with [facility]: Normal colonoscopy   Past Medical History:  Diagnosis Date   Anticoagulated    Pradaxa   Bilateral leg pain    Right leg has greater pain than the left.   Coronary artery disease cardiologist-- dr Rayann Heman   a.  s/p Xience DES to RCA 04/2009;   b. TEE 2/12: EF 40%, Large PFO;  c.  Lexiscan Myoview 05/2012: EF 69%, no ischemia. LHC (05/2012):  Ostial diagonal 30-40%, proximal mid ramus intermedius 40-50%, RCA stent patent with 40-50% after stent, then 40%, distal RCA 40-50%, EF 55-65%. Medical therapy continued.;  d.  [facility] Adam Myoview (06/2013):  No ischemia, EF 83%, normal study   Full dentures    GERD (gastroesophageal reflux disease)    History of basal cell carcinoma excision    scalp   History of hiatal hernia    History of kidney stones 51 years ago   x1   History of recurrent UTIs    Hypertension    IFG (impaired fasting glucose)    MVP (mitral valve prolapse)    mild per last echo 04-03-2017 in epic   OA (osteoarthritis)    knees , fingers   PAF (paroxysmal atrial fibrillation) [facility])    cardiologist-- dr Rayann Heman   S/P ablation of atrial fibrillation 08/15/2010   S/P drug eluting coronary stent placement 04/04/2009   Spinal stenosis    Tremor    Wears glasses    Past Surgical History:  Procedure Laterality Date   ABDOMINAL EXPOSURE N/A 05/11/2020   Procedure: ABDOMINAL EXPOSURE;  Surgeon: Rosetta Posner, MD;  Location: [place];  Service: Vascular;  Laterality: N/A;   ANAL RECTAL MANOMETRY N/A 01/11/2014   Procedure: ANAL RECTAL MANOMETRY;  Surgeon: Leighton Ruff, MD;  Location: [facility];  Service:  Endoscopy;  Laterality: N/A;   APPENDECTOMY  1973   BACK SURGERY     laminectomy times 2; 2000 and 2001   BUNIONECTOMY Bilateral 2013   CARDIAC CATHETERIZATION  10-24-2010   dr allred   singl-vessel CAD with patent stent mRCA/  moderate disease mRCA  beyond stent segment/  normal lvsf   CARDIAC ELECTROPHYSIOLOGY MAPPING AND ABLATION  08-15-2010  dr allred   CARDIOVASCULAR STRESS TEST  06-15-2013  dr allred   normal perfusion study/  no ischemia/  ef 83%   CARPAL TUNNEL RELEASE Right 25 years ago   CATARACT EXTRACTION W/ INTRAOCULAR LENS  IMPLANT, BILATERAL     CHOLECYSTECTOMY OPEN  1973   W/  APPENDECTOMY   CORONARY ANGIOPLASTY WITH STENT PLACEMENT  04-04-2009  dr Darnell Level brodie   PCI and DES x1 to  mRCA/  mLAD 40%, pCX 30%, pRCA 30%/  normal LVF   FLEXIBLE SIGMOIDOSCOPY N/A 07/07/2013   Procedure: FLEXIBLE SIGMOIDOSCOPY;  Surgeon: Winfield Cunas., MD;  [facility];  Service: Endoscopy;  Laterality: N/A;  unprepped   LEFT HEART CATH AND CORONARY ANGIOGRAPHY N/A 10/26/2019   Procedure: LEFT HEART CATH AND CORONARY ANGIOGRAPHY;  Surgeon: Martinique, Peter M, MD;  Location: [facility];  Service: Cardiovascular;  Laterality: N/A;   LEFT HEART CATHETERIZATION WITH CORONARY ANGIOGRAM N/A 05/09/2012   Procedure: LEFT HEART CATHETERIZATION WITH CORONARY ANGIOGRAM;  Surgeon: Hillary Bow, MD;  Location: [facility];  Service: Cardiovascular;  Laterality: N/A;   MOHS SURGERY  2009   scalp   PILONIDAL CYST EXCISION  2009;   2000;   [place] N/A 01/11/2014   Procedure: RECTAL ULTRASOUND;  Surgeon: Leighton Ruff, MD;  [facility];  Service: Endoscopy;  Laterality: N/A;   TOTAL KNEE ARTHROPLASTY Right 09/07/2014   Procedure: RIGHT TOTAL KNEE ARTHROPLASTY;  Surgeon: Paralee Cancel, MD;  Location: [facility];  Service: Orthopedics;  Laterality: Right;   TOTAL KNEE ARTHROPLASTY Left 12/20/2015   Procedure: LEFT TOTAL KNEE ARTHROPLASTY;  Surgeon: Paralee Cancel, MD;   Location: [facility];  Service: Orthopedics;  Laterality: Left;   TRANSTHORACIC ECHOCARDIOGRAM  04-03-2017   dr allred   Leory Plowman 55-60%/  mild MVP anterior leaflet (valve area 2.1cm^2) with mild regurg. , no stenosis/  trivial TR and PR   VAGINAL HYSTERECTOMY  1975   W/ BILATERAL SALPINOOPHOORECTOMY   VAGUS NERVE STIMULATOR INSERTION N/A 03/10/2014   Procedure: IMPLANTATION OF SACRAL  NERVE STIMULATOR ;  Surgeon: Leighton Ruff, MD;  Location: [place];  Service: General;  Laterality: N/A;  Sacral. [employer]   Current Outpatient Medications on File Prior to Visit  Medication Sig Dispense Refill   diphenhydrAMINE (BENADRYL) 25 mg capsule Take 25 mg by mouth every 6 (six) hours as needed.     hydrochlorothiazide (HYDRODIURIL) 12.5 MG tablet Take 1 tablet (12.5 mg total) by mouth daily. 30 tablet 11   lisinopril (PRINIVIL,ZESTRIL) 10 MG tablet Take 1 tablet (10 mg total) by mouth daily. 90 tablet 1   metoprolol succinate (TOPROL-XL) 25 MG 24 hr tablet Take 1 tablet (25 mg total) by mouth 2 (two) times daily. Please make overdue appt with Dr. Rayann Heman before anymore refills. Thank you 1st attempt 60 tablet 0   pantoprazole (PROTONIX) 40 MG tablet Take 1 tablet (40 mg total) by mouth 2 (two) times daily. NEEDS VISIT FOR FURTHER REFILLS 180 tablet 0   PRADAXA 150 MG CAPS capsule TAKE 1 CAPSULE TWICE A DAY 180 capsule 3   primidone (MYSOLINE) 50 MG tablet Take 2 tablets (100 mg total) by mouth at bedtime. 180 tablet 3   traZODone (DESYREL) 50 MG tablet Take 150 mg by mouth at bedtime as needed.     gabapentin (NEURONTIN) 300 MG capsule Take 300 mg by mouth  in the morning and at bedtime. (Patient not taking: Reported on 03/15/2022)     isosorbide mononitrate (IMDUR) 30 MG 24 hr tablet Take 30 mg by mouth daily.  (Patient not taking: Reported on 03/15/2022)     nitroGLYCERIN (NITROSTAT) 0.4 MG SL tablet DISSOLVE 1 TABLET UNDER THE TONGUE EVERY 5 MINUTES AS NEEDED FOR CHEST PAIN (Patient not taking:  Reported on 03/15/2022) 75 tablet 3   rosuvastatin (CRESTOR) 40 MG tablet Take 1 tablet (40 mg total) by mouth daily. Please call to schedule appointment for future refills. Final Attempt (Patient not taking: Reported on 03/15/2022) 15 tablet 0   No current facility-administered medications on file prior to visit.   Allergies  Allergen Reactions   Cephalexin Nausea Only   Current Medications, Allergies, Past Medical History, Past Surgical History, Family History and Social History were reviewed in [employer] record.  Review of Systems:   Constitutional: Negative for fever, sweats, chills or weight loss.  Respiratory: Negative for shortness of breath.   Cardiovascular: Negative for chest pain, palpitations and leg swelling.  Gastrointestinal: See HPI.  Musculoskeletal: + Back pain.  Neurological: Negative for dizziness, headaches or paresthesias.   Physical Exam: BP 124/76   Pulse 75   Ht $R'5\' 1"'aG$  (1.549 m)   Wt 195 lb (88.5 kg)   BMI 36.84 kg/m   Wt Readings from Last 3 Encounters:  03/15/22 195 lb (88.5 kg)  10/24/21 198 lb (89.8 kg)  05/04/20 189 lb 12.8 oz (86.1 kg)    General: 76 year old female in no acute distress. Head: Normocephalic and atraumatic. Eyes: No scleral icterus. Conjunctiva pink . Ears: Normal auditory acuity. Mouth: Upper and lower dentures.  No ulcers or lesions.  Lungs: Clear throughout to auscultation. Heart: Regular rate and rhythm, no murmur. Abdomen: Soft, nontender and nondistended. No masses or hepatomegaly. Normal bowel sounds x 4 quadrants.  Rectal: Brown solid stool guaiac negative. No mass.  Musculoskeletal: Symmetrical with no gross deformities. Extremities: No edema. Neurological: Alert oriented x 4. No focal deficits.  Psychological: Alert and cooperative. Normal mood and affect  Assessment and Recommendations:  67) 76 year old female with acute on chronic normocytic anemia, suspect secondary to worsening CKD. Rule  out GI etiology. Rectal exam today shoed brown stool guaiac negative.  -CBC, IBC + ferritin, B12 and folate level  -She will require EGD and colonoscopy if she is iron deficient. EGD and colonoscopy benefits and risks discussed including risk with sedation, risk of bleeding, perforation and infection. Await the above lab results.   2) Progressive chronic kidney disease -Patient to follow up with PCP for further management, recommend urgent nephrology referral.  -CMP  3) GERD, on PPI bid. Normal EGD. -Await the above lab results, recommend decreasing PPI to once daily or discontinue if renal function significantly abnormal  4) Coronary artery disease S/P DES 2010. No CP.   5) Atrial fibrillation chads2vasc score 6.  On Pradaxa.   Today's consult was 30 minutes which included precharting, chart/result review, history/exam, face-to-face time used for counseling, forming treatment plan with follow-up and documentation.   ADDENDUM: Refer to lab result 03/15/2022 notes. Iron 31. Ferritin 9.2. Saturation ratios. Patient as scheduled for EGD and colonoscopy with Dr. Hilarie Fredrickson 04/16/2022. Cardiac clearance including Pradaxa hold instructions per cariology 03/19/2022 received  as follows:  Primary Cardiologist: Thompson Grayer, MD   Chart reviewed as part of pre-operative protocol coverage. Last cardiology note and GI note reviewed. At recent visit 09/2021 with cardiology doing well. At  03/2022 GI OV, no CP, SOB, palpitations or SOB, VSS. Having issues with anemia requiring further GI workup. Patient may proceed as requested and per pharmD, "Per office protocol, patient can hold dabigatran for 2 days prior to procedure. Patient will not need bridging with Lovenox (enoxaparin) around procedure."   Will route this bundled recommendation to requesting provider via Epic fax function. Please call with questions.   Charlie Pitter, PA-C 03/21/2022, 3:36 PM  ADDENDUM 04/12/2022: EGD and colonoscopy 04/16/2022  canceled. Patient went to the ED 04/05/2022 with chest pain rated 6/10 (apparently treated for a UTI by urgent care earlier that same day). Troponin was 7. EKG showed poor R wave progression in the precordial leads without acute ischemia. Patient left the ED prior to completing her evaluation due to a long wait time.   Patient will require require follow up with her cardiologist, request a repeat cardiac clearance prior to rescheduling an EGD/Colonoscopy. See phone note today.

## 2022-03-16 ENCOUNTER — Telehealth: Payer: Self-pay

## 2022-03-16 NOTE — Telephone Encounter (Signed)
Notes faxed to Dr. Earley Brooke Office: Faxed to   [phone]

## 2022-03-16 NOTE — Telephone Encounter (Signed)
-----   Message from Noralyn Pick, NP sent at 03/15/2022  4:00 PM EDT ----- Richardson Landry, pls make sure PCP gets today's consult notes. THX.

## 2022-03-19 ENCOUNTER — Telehealth: Payer: Self-pay

## 2022-03-19 ENCOUNTER — Other Ambulatory Visit: Payer: Self-pay

## 2022-03-19 DIAGNOSIS — D649 Anemia, unspecified: Secondary | ICD-10-CM

## 2022-03-19 MED ORDER — PLENVU 140 G PO SOLR: ORAL | 0 refills | Status: DC

## 2022-03-19 MED ORDER — FERROUS SULFATE 325 (65 FE) MG PO TABS: 325.0000 mg | ORAL_TABLET | Freq: Every day | ORAL | 2 refills | Status: DC

## 2022-03-19 NOTE — Telephone Encounter (Unsigned)
[facility] Pre-operative Risk Assessment     [name] [birth year] [id number]  Procedure: Upper Endoscopy/Colonoscopy Anesthesia type:  MAC Procedure Date: 04/16/2022 Provider: Dr. Hilarie Fredrickson  Type of Clearance needed: Pharmacy/Cardiac/  Medication(s) needing held: Pradaxa   Length of time for medication to be held: Please advise  Please review request and advise by either responding to this message or by sending your response to the fax # provided below.  Thank you,  [facility]  Phone: [phone] Fax: [phone] ATTENTION: Gillermina Hu RN

## 2022-03-21 ENCOUNTER — Telehealth: Payer: Self-pay | Admitting: Internal Medicine

## 2022-03-21 NOTE — Telephone Encounter (Signed)
   Patient Name: [name]  DOB: 11/28/1945 MRN: [id number]  Primary Cardiologist: Thompson Grayer, MD  Chart reviewed as part of pre-operative protocol coverage. Last cardiology note and GI note reviewed. At recent visit 09/2021 with cardiology doing well. At 03/2022 GI OV, no CP, SOB, palpitations or SOB, VSS. Having issues with anemia requiring further GI workup. Patient may proceed as requested and per pharmD, "Per office protocol, patient can hold dabigatran for 2 days prior to procedure. Patient will not need bridging with Lovenox (enoxaparin) around procedure."  Will route this bundled recommendation to requesting provider via Epic fax function. Please call with questions.  Charlie Pitter, PA-C 03/21/2022, 3:36 PM

## 2022-03-21 NOTE — Telephone Encounter (Signed)
PT has a colonoscopy scheduled for 04/16/2022 and was prescribed Plenvu. She says its too costly and is looking for an alternative. Please advise. Thank you.

## 2022-03-21 NOTE — Telephone Encounter (Signed)
Patient with diagnosis of atrial fibrillation on dabigatran for anticoagulation.    Procedure: upper endoscopy/colonoscopy Date of procedure: 04/16/22   CHA2DS2-VASc Score = 6   This indicates a 9.7% annual risk of stroke. The patient's score is based upon: CHF History: 1 HTN History: 1 Diabetes History: 0 Stroke History: 0 Vascular Disease History: 1 Age Score: 2 Gender Score: 1   CrCl 40 Platelet count 221  Per office protocol, patient can hold dabigatran for 2 days prior to procedure.   Patient will not need bridging with Lovenox (enoxaparin) around procedure.  **This guidance is not considered finalized until pre-operative APP has relayed final recommendations.**

## 2022-03-21 NOTE — Telephone Encounter (Signed)
Covering preop today. Will route to pharm for input on anticoag. Last OV 09/2021.

## 2022-03-22 ENCOUNTER — Other Ambulatory Visit: Payer: Self-pay

## 2022-03-22 ENCOUNTER — Telehealth: Payer: Self-pay | Admitting: Nurse Practitioner

## 2022-03-22 MED ORDER — CLENPIQ 10-3.5-12 MG-GM -GM/160ML PO SOLN: 320.0000 mL | Freq: Once | ORAL | 0 refills | Status: AC

## 2022-03-22 NOTE — Telephone Encounter (Signed)
Spoke with pharmacy they stated patient picked up plenvu for $38.

## 2022-03-22 NOTE — Telephone Encounter (Signed)
Pt had questions about her Prep for her Colonoscopy: Pt was made aware that we have sent her Prep Instructions in the mail and that she should receive them next week: Pt verbalized understanding with all questions answered.

## 2022-03-22 NOTE — Telephone Encounter (Signed)
Inbound call from patient inquiring about prep medication. Please give a call to further advise.  Thank you

## 2022-03-22 NOTE — Telephone Encounter (Signed)
Returned patients call and offered her a prep kit to pick up in office. Patient respectfully declined because she did not want to drive to the office. I sent in Clenpiq it seems to be the only prep covered by insurance. I will call pharmacy to check on the cost to patient.

## 2022-03-23 ENCOUNTER — Telehealth: Payer: Self-pay

## 2022-03-23 NOTE — Telephone Encounter (Signed)
Pt was made aware that we did received clearance from her Cardiologist stating Per office protocol, patient can hold dabigatran for 2 days prior to procedure Pt verbalized understanding with all questions answered.

## 2022-03-26 ENCOUNTER — Telehealth: Payer: Self-pay | Admitting: Internal Medicine

## 2022-03-26 NOTE — Telephone Encounter (Signed)
Returned call to patient. I spent 15 minutes on the phone with the patient. I informed her that the [facility] is to further assess her anemia. Pt does not understand why ECL is being performed if she is having blood in her urine. I explained to patient that they want to rule out a GI source for her anemia. Pt states that she has had several procedures and nothing is ever found. Pt states that its about the insurance. I informed patient that the physician is only trying to find a source of her anemia. Pt wanted to know what would be done if nothing was doing, I told her the physician may order a VCE or may refer her to Hematology. Pt states that her levels have been this way for years and they never find any cause. I told pt that it is her choice if she wishes to proceed or not. Pt states that she will proceed this time, but she will not do anymore. I informed her that at her age no further colonoscopies would be recommended unless its diagnostic. Pt also reports that she saw [facility] a couple of weeks ago and they prescribed her an antibiotic for the hematuria. She had a CT scan will [facility] and she states that it was normal and UA did not show any infection. Pt continues to have blood in urine, I told her that she needs to follow up with urology regarding that. Pt verbalized understanding of all information.

## 2022-03-26 NOTE — Telephone Encounter (Signed)
Patient called states she wants to know why she will be having EGD and colon procedure if she is having urination problem. Requesting a call back.

## 2022-03-27 NOTE — Progress Notes (Signed)
Addendum: Reviewed and agree with assessment and management plan. Josephine Rudnick M, MD  

## 2022-03-29 ENCOUNTER — Ambulatory Visit: Payer: Medicare Other | Admitting: Physician Assistant

## 2022-04-02 ENCOUNTER — Telehealth: Payer: Self-pay | Admitting: Nurse Practitioner

## 2022-04-02 NOTE — Telephone Encounter (Signed)
Inbound call from patient requesting a call back to discuss why she is scheduled for a endoscopy and colonoscopy on 10/16 if she is not having pain in her rectum/ colon but is having pain in her stomach. Please advise.

## 2022-04-02 NOTE — Telephone Encounter (Signed)
Patient returned your call.

## 2022-04-02 NOTE — Telephone Encounter (Signed)
Left message for patient to call back  

## 2022-04-03 ENCOUNTER — Telehealth: Payer: Self-pay

## 2022-04-03 ENCOUNTER — Telehealth: Payer: Self-pay | Admitting: Internal Medicine

## 2022-04-03 NOTE — Telephone Encounter (Signed)
Pt called in questioning why does everybody else know what's going on beside her; why is her procedure being rescheduled: Chart reviewed: Noted that there is no documentation of procedure being rescheduled: Pt stated that her son has told her that her procedure was being rescheduled: Pt was encouraged to call her son and ask for clarification:   Pt seems confused:

## 2022-04-03 NOTE — Telephone Encounter (Signed)
Patient called states she would like to speak to the nurse, also states that we should only release informations to her, not her son.

## 2022-04-03 NOTE — Telephone Encounter (Signed)
Pts son called and wanted to know why the appt for his mother was cancelled for 9/28. Discussed with pts son that his mother called on 02/26/22 requesting a sooner appt. She was scheduled for an appt on 03/15/22 and the appt on 9/28 was cancelled at that time because she did not need 2 appts. Pt was notified of cancellation of the 9/28 appt at that time. Pt is arguing with her son regarding her appts and her colon prep. He and the PA his mom sees are concerned that she may have dementia. He wanted to let us know to call him with appts to try and keep this from happening again. He is listed on the DPR and states he is also health care POA.

## 2022-04-03 NOTE — Telephone Encounter (Signed)
I called patient to speak with her about her concerns.  Patient was very confused and argumentative about why she was scheduled for a procedure.  She states she has been anemic for many years and doesn't understand why anyone cares now.  She consents to proceed with the procedure "I am just tired of fighting everyone and being yelled at". I inquired if she had her instructions and she does have them.  She could not tell me when she needed to hold her pradaxa, these instructions were provided to her by S. Jenne Campus RN earlier today.  Her granddaughter Cletis Athens, RN  was present and obtained permission from her grandmother to speak with me.  We reviewed the reasons for the procedures, dates and times, she confirmed understanding to hold Pradaxa starting 04/14/22 until procedure on 04/16/22.  She had a copy of procedure instructions and confirmed she will make sure patient has them and understands prep.  I asked the patient about the DPR  change request and if she did want to have her son removed.  Patient and her granddaughter wanted to discuss this further, Lanelle Bal does not feel it would be a good idea to have the son removed as he has been the person that has been assisting her with appts .  They understand that if patient feels strongly about removal or adding anyone, she may do that at her procedure on 04/16/22 in writing.  They both understand to call back if they have any additional questions or concerns.

## 2022-04-05 ENCOUNTER — Encounter (HOSPITAL_COMMUNITY): Payer: Self-pay | Admitting: Emergency Medicine

## 2022-04-05 ENCOUNTER — Other Ambulatory Visit: Payer: Self-pay

## 2022-04-05 ENCOUNTER — Emergency Department (HOSPITAL_COMMUNITY): Payer: Medicare Other

## 2022-04-05 ENCOUNTER — Emergency Department (HOSPITAL_COMMUNITY)
Admission: EM | Admit: 2022-04-05 | Discharge: 2022-04-05 | Discharge status: Against Medical Advice | Payer: Medicare Other | Attending: Emergency Medicine | Admitting: Emergency Medicine

## 2022-04-05 DIAGNOSIS — Z5321 Procedure and treatment not carried out due to patient leaving prior to being seen by health care provider: Secondary | ICD-10-CM | POA: Diagnosis not present

## 2022-04-05 DIAGNOSIS — R079 Chest pain, unspecified: Secondary | ICD-10-CM | POA: Diagnosis present

## 2022-04-05 DIAGNOSIS — R0602 Shortness of breath: Secondary | ICD-10-CM | POA: Diagnosis not present

## 2022-04-05 LAB — BASIC METABOLIC PANEL WITH GFR
Anion gap: 12 (ref 5–15)
BUN: 14 mg/dL (ref 8–23)
CO2: 24 mmol/L (ref 22–32)
Calcium: 9.1 mg/dL (ref 8.9–10.3)
Chloride: 100 mmol/L (ref 98–111)
Creatinine, Ser: 1.48 mg/dL — ABNORMAL HIGH (ref 0.44–1.00)
GFR, Estimated: 36 mL/min — ABNORMAL LOW
Glucose, Bld: 146 mg/dL — ABNORMAL HIGH (ref 70–99)
Potassium: 3.6 mmol/L (ref 3.5–5.1)
Sodium: 136 mmol/L (ref 135–145)

## 2022-04-05 LAB — CBC
HCT: 32.5 % — ABNORMAL LOW (ref 36.0–46.0)
Hemoglobin: 9.6 g/dL — ABNORMAL LOW (ref 12.0–15.0)
MCH: 26.3 pg (ref 26.0–34.0)
MCHC: 29.5 g/dL — ABNORMAL LOW (ref 30.0–36.0)
MCV: 89 fL (ref 80.0–100.0)
Platelets: 230 10*3/uL (ref 150–400)
RBC: 3.65 MIL/uL — ABNORMAL LOW (ref 3.87–5.11)
RDW: 17.8 % — ABNORMAL HIGH (ref 11.5–15.5)
WBC: 6.8 10*3/uL (ref 4.0–10.5)
nRBC: 0 % (ref 0.0–0.2)

## 2022-04-05 LAB — TROPONIN I (HIGH SENSITIVITY): Troponin I (High Sensitivity): 7 ng/L (ref <18)

## 2022-04-05 NOTE — ED Notes (Signed)
Pt stated to writer that they were leaving due to long wait times

## 2022-04-05 NOTE — ED Provider Triage Note (Signed)
Emergency Medicine Provider Triage Evaluation Note  TESSY PAWELSKI , a 76 y.o. female  was evaluated in triage.  Pt complains of chest pain.  Patient states chest pain began last night when she was sitting down.  The patient states the chest pain has been steady since this time.  The patient denies any association of worsening of chest pain with exertion.  The patient reports that she is short of breath however she is always short of breath, the shortness of breath is not any worse than her baseline.  The patient states chest pain is located on the middle left side of her chest, does not radiate.  The patient denies any nausea, vomiting, abdominal pain, lightheadedness, dizziness, weakness, loss of consciousness.  The patient was seen at urgent care, diagnosed with UTI and advised to report to ED for further management.  Patient was placed on antibiotics at urgent care.  Patient denies any fevers.  Review of Systems  Positive:  Negative:   Physical Exam  Ht '5\' 1"'$  (1.549 m)   Wt 88.5 kg   BMI 36.87 kg/m  Gen:   Awake, no distress   Resp:  Normal effort  MSK:   Moves extremities without difficulty  Other:    Medical Decision Making  Medically screening exam initiated at 7:31 PM.  Appropriate orders placed.  [name] was informed that the remainder of the evaluation will be completed by another provider, this initial triage assessment does not replace that evaluation, and the importance of remaining in the ED until their evaluation is complete.     Azucena Cecil, PA-C 04/05/22 1932

## 2022-04-05 NOTE — ED Triage Notes (Signed)
Pt reports chest pain 6/10 starting yesterday.  Pt has a cardiac Hx. Pt takes blood thinners.  She was diagnosed w/ a UTI today at [facility].  No other symptoms.

## 2022-04-09 ENCOUNTER — Telehealth: Payer: Self-pay | Admitting: Nurse Practitioner

## 2022-04-09 ENCOUNTER — Other Ambulatory Visit: Payer: Self-pay

## 2022-04-09 ENCOUNTER — Other Ambulatory Visit: Payer: Self-pay | Admitting: Internal Medicine

## 2022-04-09 DIAGNOSIS — I480 Paroxysmal atrial fibrillation: Secondary | ICD-10-CM

## 2022-04-09 MED ORDER — DABIGATRAN ETEXILATE MESYLATE 150 MG PO CAPS: 150.0000 mg | ORAL_CAPSULE | Freq: Two times a day (BID) | ORAL | 3 refills | Status: DC

## 2022-04-09 NOTE — Telephone Encounter (Signed)
Prescription refill request for Pradaxa received.  Indication: Afib  Last office visit: 10/24/21 Chalmers Cater) Weight: 88.5kg Age: 76 Scr: 1.48 (04/05/22)  CrCl: 43.37m/min  Appropriate dose and refill sent to requested pharmacy.

## 2022-04-09 NOTE — Telephone Encounter (Signed)
Inbound call from patient stating that she has a kidney infection and is on medications and is seeking advice if that will interfere with her procedure that is scheduled 10/16 at 3:00

## 2022-04-09 NOTE — Telephone Encounter (Signed)
Pt stated that she was taking medication for a kidney infection and questioned if she could still have her procedure:  Pt was notified to proceed with the procedure as scheduled: Pt verbalized understanding with all questions answered.

## 2022-04-11 ENCOUNTER — Telehealth: Payer: Medicare Other | Admitting: Nurse Practitioner

## 2022-04-11 ENCOUNTER — Telehealth: Payer: Self-pay | Admitting: Nurse Practitioner

## 2022-04-11 NOTE — Telephone Encounter (Signed)
Remo Lipps, disregard this message as I have just reviewed a lengthy phone message from [facility] 04/03/2022 and she explained Pradaxa hold instructions to family and patient.

## 2022-04-11 NOTE — Telephone Encounter (Signed)
Dr. Hilarie Fredrickson, refer to office visit 03/15/2022. Cardiac clearance with Pradaxa hold instructions were received 03/21/2022.   See phone note from [facility] 04/03/2022  I just saw Epic notes which showed she was in the ED 04/05/2022 with chest pain rated 6/10 (apparently treated for a UTI by urgent care earlier that same day). Troponin was 7. EKG showed poor R wave progression in the precordial leads without acute ischemia. Patient left the ED prior to completing her evaluation due to a long wait time.  Shall we cancel her EGD/colonoscopy scheduled with you 04/16/2022 and send her back to cardiology?

## 2022-04-11 NOTE — Telephone Encounter (Signed)
[name], see cardiology note 03/21/2022, pls contact patient and let her know she needs to hold  her Pradaxa (Dabigatran) for 2 days prior to her EGD as verified by cardiology. Not sure is she was already given these instructions. Her EGD and colonoscopy are scheduled 04/16/2022 with Dr. Hilarie Fredrickson. THX.

## 2022-04-12 ENCOUNTER — Telehealth: Payer: Self-pay | Admitting: Nurse Practitioner

## 2022-04-12 NOTE — Telephone Encounter (Signed)
Pt son [name] contacted and made aware that  we need to cancel her EGD and colonoscopy with Dr. Hilarie Fredrickson due to her having recent chest pain as documented in her ED visit on 04/05/2022. [name] was notified that [name]  needs to schedule a follow up appt with her cardiologist and we will need a repeat cardiac clearance prior to scheduling any future endoscopic procedures. [name] request a call by Carl Best NP to discuss events with his mom recently and to discuss the Chest pain that his mom claimed that she had:

## 2022-04-12 NOTE — Telephone Encounter (Signed)
[name], pls contact patient and family and let them know we need to cancel her EGD and colonoscopy with Dr. Hilarie Fredrickson due to her having recent chest pain as documented in her ED visit on 04/05/2022. Patient needs to schedule a follow up appt with her cardiologist and we will need a repeat cardiac clearance prior to scheduling any future endoscopic procedures. Thank you.

## 2022-04-12 NOTE — Telephone Encounter (Signed)
See prior phone note today. I attempted to call the patient's son Briseis Aguilera     at the provided # [phone], I left a msg stating I would try to call him back later today or tomorrow.

## 2022-04-12 NOTE — Telephone Encounter (Signed)
I recommend that we delay her procedures until she can be seen by cardiology given that she was back in the hospital with chest pain

## 2022-04-13 ENCOUNTER — Telehealth: Payer: Self-pay | Admitting: Cardiology

## 2022-04-13 ENCOUNTER — Telehealth: Payer: Self-pay | Admitting: Nurse Practitioner

## 2022-04-13 NOTE — Telephone Encounter (Signed)
Inbound call from patient stating that she would like a call back to discuss why her procedure is going to be canceled. She stated she did not understand what is going on. Please advise.

## 2022-04-13 NOTE — Telephone Encounter (Signed)
Returned patient's call & had a long discussion on why the procedure was being cancelled. She's been advised that per Jaclyn Shaggy, NP and Dr. Hilarie Fredrickson that procedure would need to be cancelled until cardiac clearance has been received given her recent ED visit. She was very frustrated & feels that she is being left to suffer and does not want to proceed with a future procedure date. Advised her that the procedure is still recommended, however it will just need to be postponed until clearance is received for both safety and liability reasons. Son has also been made aware of changes & that Jaclyn Shaggy will be reaching out to him to discuss. He mentioned that going forward he would prefer that patient be contacted first regarding any updates/changes in care.

## 2022-04-13 NOTE — Telephone Encounter (Signed)
Patient's daughter called states she would like to speak to the nurse. However she's not listed on her DPR.

## 2022-04-13 NOTE — Telephone Encounter (Signed)
   [facility] Pre-operative Risk Assessment    Request for surgical clearance:  What type of surgery is being performed?  EGD  When is this surgery scheduled?  TBD    What type of clearance is required (medical clearance vs. Pharmacy clearance to hold med vs. Both)?  Medical clearance   Are there any medications that need to be held prior to surgery and how long? No   Practice name and name of physician performing surgery?  [facility]  Dr. Hilarie Fredrickson   What is your office phone number? [phone]    7.   What is your office fax number? [phone]  8.   Anesthesia type (None, local, MAC, general) ?  Propofol   [name] 04/13/2022, 4:00 PM

## 2022-04-13 NOTE — Telephone Encounter (Signed)
Spoke with [employer] attorney), and at this time we are unable to speak with anyone that is not listed on the [facility] list until patient can come into the office to update form per risk.

## 2022-04-13 NOTE — Telephone Encounter (Addendum)
I spoke with the patient and her husband for more than 30 minutes, trying to get her to understand that we are needing to cancel her procedure for 04/16/22 until she can be evaluated by Cardiology and obtain medical clearance.  Patient was very confused, argumentative that the ED record is incorrect, and she does not need evaluation. She stated that she called cardiology today and they told her that she did not need to be seen, but went on to say she reported to them she was following up with them for a "bladder infection".  I offered to call cardiology to schedule an office visit so we can reschedule her EGD/colon.  She began to cry and yell that she did not want to have anymore doctors appts and did not want to proceed with either the EGD or the colon.  She was very upset that she was not contacted directly and that we are speaking with her son about her.  I advised her again that if she wants her son to be removed from her DPR she will need to come here and revoke it in person.  The patient is very confused and repeats the same things over and over.  She is not comprehending that we can't proceed with the procedure without medical clearance and this is based on the symptoms she reported to the ED.  She states she told them it was indigestion.  I stressed that she needs to remain on her Pradaxa.  She refuses to allow me to contact cardiology on her behalf to arrange the cardiology clearance. I instructed her I am cancelling the procedure for Monday and that she will need to have clearance before we can proceed. She hung up the phone without verbalizing understanding to remain on her Pradaxa.  Her husband was not able to understand the situation as well, kept yelling in the background that we were just trying to "Kill her".  I spoke with the patient's son Lennette Bihari, listed on DPR, he verbalized understanding she should remain on her Pradaxa and get an appointment with cardiology for medical clearance, then they will  call back to reschedule EGD/colon for evaluation of her IDA.

## 2022-04-13 NOTE — Telephone Encounter (Signed)
I called to request an office visit, not clearance. I wanted to call the patient back with a date and time for an office visit. Will someone from the office contact her please?   She had an ED visit on 04-05-22 and stated she was having chest pains. We would like her seen in person at Cardiology before we schedule EGD please.

## 2022-04-13 NOTE — Telephone Encounter (Signed)
[name] and [name], thank you all for all of your efforts to communicate with the patient and her family as I have been at [facility] and [facility] the past 2 days. If any future endoscopic procedure to be pursed I think it would be best for patient to be seen directly with Dr. Hilarie Fredrickson as he would be the physician to do these procedures. However, we now have 2 documented phone calls with [facility] and the patient which document the patient does not want to have any procedures. Also, there is some concern with her cognitive status. Await further recommendations from Dr. Hilarie Fredrickson.

## 2022-04-16 ENCOUNTER — Encounter: Payer: Medicare Other | Admitting: Internal Medicine

## 2022-04-16 NOTE — Telephone Encounter (Signed)
Pt is an ep patient. First available is 05/08/22 with EP. Pt asking if she can f/u with gen card APP to be seen sooner.

## 2022-04-16 NOTE — Telephone Encounter (Signed)
Patent is calling back to ask what kind of cardiologist is she needing to get clearance from.

## 2022-04-16 NOTE — Telephone Encounter (Signed)
Upper endoscopy and colonoscopy remain recommended but given her chest pain, cardiology evaluation and opinion is required before endoscopic procedures My staff has been in touch with the patient and her family I appreciate their time and helping arrange the appropriate care for this patient

## 2022-04-16 NOTE — Telephone Encounter (Signed)
Left message for the pt that we did have a sooner appt 04/30/22 @ 1:55 with Tommye Standard, PAC. Left message to call back and hopefully the appt will still be there.

## 2022-04-16 NOTE — Telephone Encounter (Signed)
Inbound call from patient son nikea settle in regards to cardiac clearance for patient to have a procedure. [name] give patient son a call back at [phone] to further advise.  Thank you

## 2022-04-16 NOTE — Telephone Encounter (Signed)
Spoke with pt son Lennette Bihari and notified him that we still need cardiac clearance before we schedule a procedure here. Pt stated that his mom is telling him different. Lennette Bihari was notified that nothing has changed since I had talk to him on Thursday. Lennette Bihari was encouraged to call me with any further questions or concerns with his mom.

## 2022-04-17 NOTE — Telephone Encounter (Signed)
Pt has appt 05/01/22 with Dillon Bjork, PAC for pre op clearance.

## 2022-04-17 NOTE — Telephone Encounter (Signed)
We need to followup after this cards visit and if cleared get her back on for EGD/colon to evaluate IDA

## 2022-04-23 NOTE — Telephone Encounter (Signed)
Patient is calling requesting a callback from a nurse to discuss why she has to have an appt with cardiology to be able to go through with what Dr. Hilarie Fredrickson is requesting. Explained to patient the appointment is for preop clearance and rescheduled her appointment for sooner on 10/25 with Dr. Quentin Ore after speaking with [facility] to confirm this was okay. Patient was upset her husband is able to be seen sooner when she is in more need. Explained to the patient her husband sees general cardiology and receives a different form of care. Patient was still upset and feels we are just trying to take her money. Please advise.

## 2022-04-23 NOTE — Telephone Encounter (Signed)
I called pt back and left in regard to her being upset, see notes from pt call today.   I left message that the reason for her pre op appt is that there is a protocol the cardiologist do have to follow. Pt appt was able to be moved up sooner to 04/25/22.

## 2022-04-25 ENCOUNTER — Ambulatory Visit: Payer: Medicare Other | Attending: Physician Assistant | Admitting: Cardiology

## 2022-04-25 ENCOUNTER — Encounter: Payer: Self-pay | Admitting: Cardiology

## 2022-04-25 VITALS — BP 124/66 | HR 100 | Ht 62.0 in | Wt 185.0 lb

## 2022-04-25 DIAGNOSIS — I480 Paroxysmal atrial fibrillation: Secondary | ICD-10-CM | POA: Diagnosis present

## 2022-04-25 DIAGNOSIS — I10 Essential (primary) hypertension: Secondary | ICD-10-CM | POA: Diagnosis present

## 2022-04-25 DIAGNOSIS — I2510 Atherosclerotic heart disease of native coronary artery without angina pectoris: Secondary | ICD-10-CM | POA: Insufficient documentation

## 2022-04-25 NOTE — Progress Notes (Signed)
Electrophysiology Office Note:    Date:  04/25/2022   ID:  [name], DOB 07-23-1945, MRN [id number]  PCP:  Clint Guy  [facility] Cardiologist:  None  [facility] Electrophysiologist:  Vickie Epley, MD   Referring MD: Fransisca Connors, PA-C   Chief Complaint: AF  History of Present Illness:    [name] is a 76 y.o. female who presents for an evaluation of AF at the request of Oda Kilts. Their medical history includes CAD s/p prior PCI, GERD, HTN, MVP, pAF previously followed by Dr Rayann Heman. Patient had an ablation in 2012. At the last appointment 10/24/2021 with Jonni Sanger she was in sinus rhythm.  She has a planned EGD/colonoscopy with Dr Hilarie Fredrickson to further investigate anemia. She presented to the ER on 04/05/2022 for chest pain but left without being seen. A pre-op clearance appointment was requested by Dr Vena Rua office.   Today,  She is here to get approval for an endoscopy and coloscopy.   She consistently vomits after eating. No specific foods trigger it.  She is not physically active, but she is able to walk around the grocery store herself without shortness of breath or palpatations.  She denies any palpitations, chest pain, shortness of breath, or peripheral edema. No lightheadedness, headaches, syncope, orthopnea, or PND.      Past Medical History:  Diagnosis Date   Anticoagulated    Pradaxa   Bilateral leg pain    Right leg has greater pain than the left.   Coronary artery disease cardiologist-- dr Rayann Heman   a.  s/p Xience DES to RCA 04/2009;   b. TEE 2/12: EF 40%, Large PFO;  c.  Lexiscan Myoview 05/2012: EF 69%, no ischemia. LHC (05/2012):  Ostial diagonal 30-40%, proximal mid ramus intermedius 40-50%, RCA stent patent with 40-50% after stent, then 40%, distal RCA 40-50%, EF 55-65%. Medical therapy continued.;  d.  Carlton Adam Myoview (06/2013):  No ischemia, EF 83%, normal study   Full dentures    GERD (gastroesophageal reflux disease)     History of basal cell carcinoma excision    scalp   History of hiatal hernia    History of kidney stones 51 years ago   x1   History of recurrent UTIs    Hypertension    IFG (impaired fasting glucose)    MVP (mitral valve prolapse)    mild per last echo 04-03-2017 in epic   OA (osteoarthritis)    knees , fingers   PAF (paroxysmal atrial fibrillation) [facility])    cardiologist-- dr Rayann Heman   S/P ablation of atrial fibrillation 08/15/2010   S/P drug eluting coronary stent placement 04/04/2009   Spinal stenosis    Tremor    Wears glasses     Past Surgical History:  Procedure Laterality Date   ABDOMINAL EXPOSURE N/A 05/11/2020   Procedure: ABDOMINAL EXPOSURE;  Surgeon: Rosetta Posner, MD;  Location: [place];  Service: Vascular;  Laterality: N/A;   ANAL RECTAL MANOMETRY N/A 01/11/2014   Procedure: ANAL RECTAL MANOMETRY;  Surgeon: Leighton Ruff, MD;  Location: [facility];  Service: Endoscopy;  Laterality: N/A;   APPENDECTOMY  1973   BACK SURGERY     laminectomy times 2; 2000 and 2001   BUNIONECTOMY Bilateral 2013   CARDIAC CATHETERIZATION  10-24-2010   dr allred   singl-vessel CAD with patent stent mRCA/  moderate disease mRCA beyond stent segment/  normal lvsf   CARDIAC ELECTROPHYSIOLOGY MAPPING AND ABLATION  08-15-2010  dr Rayann Heman  CARDIOVASCULAR STRESS TEST  06-15-2013  dr allred   normal perfusion study/  no ischemia/  ef 83%   CARPAL TUNNEL RELEASE Right 25 years ago   CATARACT EXTRACTION W/ INTRAOCULAR LENS  IMPLANT, BILATERAL     CHOLECYSTECTOMY OPEN  1973   W/  APPENDECTOMY   CORONARY ANGIOPLASTY WITH STENT PLACEMENT  04-04-2009  dr Darnell Level brodie   PCI and DES x1 to  mRCA/  mLAD 40%, pCX 30%, pRCA 30%/  normal LVF   FLEXIBLE SIGMOIDOSCOPY N/A 07/07/2013   Procedure: FLEXIBLE SIGMOIDOSCOPY;  Surgeon: Winfield Cunas., MD;  Location: [facility];  Service: Endoscopy;  Laterality: N/A;  unprepped   LEFT HEART CATH AND CORONARY ANGIOGRAPHY N/A 10/26/2019   Procedure: LEFT HEART  CATH AND CORONARY ANGIOGRAPHY;  Surgeon: Martinique, Peter M, MD;  Location: [place] [facility];  Service: Cardiovascular;  Laterality: N/A;   LEFT HEART CATHETERIZATION WITH CORONARY ANGIOGRAM N/A 05/09/2012   Procedure: LEFT HEART CATHETERIZATION WITH CORONARY ANGIOGRAM;  Surgeon: Hillary Bow, MD;  Location: [facility] C[facility] LAB;  Service: Cardiovascular;  Laterality: N/A;   MOHS SURGERY  2009   scalp   PILONIDAL CYST EXCISION  2009;   2000;   [place] N/A 01/11/2014   Procedure: RECTAL ULTRASOUND;  Surgeon: Leighton Ruff, MD;  Location[facility];  Service: Endoscopy;  Laterality: N/A;   TOTAL KNEE ARTHROPLASTY Right 09/07/2014   Procedure: RIGHT TOTAL KNEE ARTHROPLASTY;  Surgeon: Paralee Cancel, MD;  Location: [facility];  Service: Orthopedics;  Laterality: Right;   TOTAL KNEE ARTHROPLASTY Left 12/20/2015   Procedure: LEFT TOTAL KNEE ARTHROPLASTY;  Surgeon: Paralee Cancel, MD;  Location: [facility];  Service: Orthopedics;  Laterality: Left;   TRANSTHORACIC ECHOCARDIOGRAM  04-03-2017   dr allred   Leory Plowman 55-60%/  mild MVP anterior leaflet (valve area 2.1cm^2) with mild regurg. , no stenosis/  trivial TR and PR   VAGINAL HYSTERECTOMY  1975   W/ BILATERAL SALPINOOPHOORECTOMY   VAGUS NERVE STIMULATOR INSERTION N/A 03/10/2014   Procedure: IMPLANTATION OF SACRAL  NERVE STIMULATOR ;  Surgeon: Leighton Ruff, MD;  Location: [place];  Service: General;  Laterality: N/A;  Sacral. [employer]    Current Medications: Current Meds  Medication Sig   dabigatran (PRADAXA) 150 MG CAPS capsule Take 1 capsule (150 mg total) by mouth 2 (two) times daily.   diphenhydrAMINE (BENADRYL) 25 mg capsule Take 25 mg by mouth every 6 (six) hours as needed.   hydrochlorothiazide (HYDRODIURIL) 12.5 MG tablet Take 1 tablet (12.5 mg total) by mouth daily.   isosorbide mononitrate (IMDUR) 30 MG 24 hr tablet Take 30 mg by mouth daily.   lisinopril (PRINIVIL,ZESTRIL) 10 MG tablet Take 1 tablet (10 mg total) by mouth  daily.   metoprolol succinate (TOPROL-XL) 25 MG 24 hr tablet Take 1 tablet (25 mg total) by mouth 2 (two) times daily. Please make overdue appt with Dr. Rayann Heman before anymore refills. Thank you 1st attempt   nitroGLYCERIN (NITROSTAT) 0.4 MG SL tablet DISSOLVE 1 TABLET UNDER THE TONGUE EVERY 5 MINUTES AS NEEDED FOR CHEST PAIN   pantoprazole (PROTONIX) 40 MG tablet Take 1 tablet (40 mg total) by mouth 2 (two) times daily. NEEDS VISIT FOR FURTHER REFILLS   PEG-KCl-NaCl-NaSulf-Na Asc-C (PLENVU) 140 g SOLR Take as directed from the Prep Instructions that were sent to you   PRADAXA 150 MG CAPS capsule TAKE 1 CAPSULE TWICE A DAY   primidone (MYSOLINE) 50 MG tablet Take 2 tablets (100 mg total)  by mouth at bedtime.   rosuvastatin (CRESTOR) 40 MG tablet Take 1 tablet (40 mg total) by mouth daily. Please call to schedule appointment for future refills. Final Attempt   traZODone (DESYREL) 50 MG tablet Take 150 mg by mouth at bedtime as needed.   [DISCONTINUED] ferrous sulfate 325 (65 FE) MG tablet Take 1 tablet (325 mg total) by mouth daily with breakfast.   [DISCONTINUED] gabapentin (NEURONTIN) 300 MG capsule Take 300 mg by mouth in the morning and at bedtime.     Allergies:   Cephalexin   Social History   Socioeconomic History   Marital status: Married    Spouse name: Not on file   Number of children: 2   Years of education: HS   Highest education level: Not on file  Occupational History   Occupation: retired    Fish farm manager: RETIRED  Tobacco Use   Smoking status: Former    Packs/day: 0.50    Years: 20.00    Total pack years: 10.00    Types: Cigarettes    Quit date: 07/02/1988    Years since quitting: 33.8   Smokeless tobacco: Never  Vaping Use   Vaping Use: Never used  Substance and Sexual Activity   Alcohol use: No   Drug use: Never   Sexual activity: Not Currently    Birth control/protection: Surgical    Comment: 1st intercourse 52 yo-1 partner  Other Topics Concern   Not on file   Social History Narrative   Not on file   Social Determinants of Health   Financial Resource Strain: Not on file  Food Insecurity: Not on file  Transportation Needs: Not on file  Physical Activity: Not on file  Stress: Not on file  Social Connections: Not on file     Family History: The patient's family history includes Coronary artery disease in her father; Heart disease in her brother, mother, and sister.  ROS:   Please see the history of present illness.     (+) Vomiting  All other systems reviewed and are negative.  EKGs/Labs/Other Studies Reviewed:    The following studies were reviewed today:  11/05/2019 Echo EF 55 RV normal Mild MR   EKG:   The EKG 04/06/2022 shows sinus rhythm   Recent Labs: 03/15/2022: ALT 9 04/05/2022: BUN 14; Creatinine, Ser 1.48; Hemoglobin 9.6; Platelets 230; Potassium 3.6; Sodium 136  Recent Lipid Panel    Component Value Date/Time   CHOL (H) 04/01/2009 1601    218        ATP III CLASSIFICATION:  <200     mg/dL   Desirable  200-239  mg/dL   Borderline High  >=240    mg/dL   High          TRIG 295 (H) 04/01/2009 1601   HDL 33 (L) 04/01/2009 1601   CHOLHDL 6.6 04/01/2009 1601   VLDL 59 (H) 04/01/2009 1601   LDLCALC (H) 04/01/2009 1601    126        Total Cholesterol/HDL:CHD Risk Coronary Heart Disease Risk Table                     Men   Women  1/2 Average Risk   3.4   3.3  Average Risk       5.0   4.4  2 X Average Risk   9.6   7.1  3 X Average Risk  23.4   11.0        Use the calculated  Patient Ratio above and the CHD Risk Table to determine the patient's CHD Risk.        ATP III CLASSIFICATION (LDL):  <100     mg/dL   Optimal  100-129  mg/dL   Near or Above                    Optimal  130-159  mg/dL   Borderline  160-189  mg/dL   High  >190     mg/dL   Very High    Physical Exam:    VS:  BP 124/66   Pulse 100   Ht '5\' 2"'$  (1.575 m)   Wt 185 lb (83.9 kg)   BMI 33.84 kg/m     Wt Readings from Last 3  Encounters:  04/25/22 185 lb (83.9 kg)  04/05/22 195 lb 1.7 oz (88.5 kg)  03/15/22 195 lb (88.5 kg)     GEN:  Well nourished, well developed in no acute distress HEENT: Normal NECK: No JVD; No carotid bruits LYMPHATICS: No lymphadenopathy CARDIAC: RRR, no murmurs, rubs, gallops RESPIRATORY:  Clear to auscultation without rales, wheezing or rhonchi  ABDOMEN: Soft, non-tender, non-distended MUSCULOSKELETAL:  No edema; No deformity  SKIN: Warm and dry NEUROLOGIC:  Alert and oriented x 3 PSYCHIATRIC:  Normal affect       ASSESSMENT:    1. Paroxysmal atrial fibrillation (HCC)   2. Coronary artery disease involving native coronary artery of native heart without angina pectoris   3. Essential hypertension    PLAN:    In order of problems listed above:  #Paroxysmal atrial fibrillation No recurrent episodes after her prior ablation.  On Pradaxa for stroke prophylaxis.   #Coronary artery disease No ischemic symptoms today.  Left heart catheterization in April 2021 showed nonobstructive coronary artery disease and a normal LVEDP.   #Hypertension At goal today.  Recommend checking blood pressures 1-2 times per week at home and recording the values.  Recommend bringing these recordings to the primary care physician.   #Preop clearance Ms. Birmingham's perioperative risk of a major cardiac event is 0.9% according to the Revised Cardiac Risk Index (RCRI).  Therefore, she is at low risk for perioperative complications.   Her functional capacity is good. Recommendations: According to ACC/AHA guidelines, no further cardiovascular testing needed.  The patient may proceed to surgery at acceptable risk.   Antiplatelet and/or Anticoagulation Recommendations: Pradaxa (Dabigatran) can be held for 2 days prior to surgery.  Please resume post op when felt to be safe.        Medication Adjustments/Labs and Tests Ordered: Current medicines are reviewed at length with the patient today.  Concerns  regarding medicines are outlined above.  No orders of the defined types were placed in this encounter.  No orders of the defined types were placed in this encounter.   I,Mary Mosetta Pigeon Buren,acting as a scribe for Vickie Epley, MD.,have documented all relevant documentation on the behalf of Vickie Epley, MD,as directed by  Vickie Epley, MD while in the presence of Vickie Epley, MD.   I, Vickie Epley, MD, have reviewed all documentation for this visit. The documentation on 04/25/22 for the exam, diagnosis, procedures, and orders are all accurate and complete.   Signed, Hilton Cork. Quentin Ore, MD, [facility], [facility] 04/25/2022 9:16 PM    Electrophysiology [facility]

## 2022-04-25 NOTE — Patient Instructions (Signed)
Medication Instructions:  None  *If you need a refill on your cardiac medications before your next appointment, please call your pharmacy*   Lab Work: None  If you have labs (blood work) drawn today and your tests are completely normal, you will receive your results only by: MyChart Message (if you have MyChart) OR A paper copy in the mail If you have any lab test that is abnormal or we need to change your treatment, we will call you to review the results.   Testing/Procedures: None    Follow-Up: At [facility], you and your health needs are our priority.  As part of our continuing mission to provide you with exceptional heart care, we have created designated Provider Care Teams.  These Care Teams include your primary Cardiologist (physician) and Advanced Practice Providers (APPs -  Physician Assistants and Nurse Practitioners) who all work together to provide you with the care you need, when you need it.  We recommend signing up for the patient portal called "MyChart".  Sign up information is provided on this After Visit Summary.  MyChart is used to connect with patients for Virtual Visits (Telemedicine).  Patients are able to view lab/test results, encounter notes, upcoming appointments, etc.  Non-urgent messages can be sent to your provider as well.   To learn more about what you can do with MyChart, go to [email or web address].    Your next appointment:   As needed with Dr. Lambert  Important Information About Sugar       

## 2022-04-25 NOTE — Progress Notes (Deleted)
Electrophysiology Office Note:    Date:  04/25/2022   ID:  [name], DOB 1946/06/25, MRN [id number]  PCP:  Clint Guy  [facility] Cardiologist:  None  [facility] Electrophysiologist:  Vickie Epley, MD   Referring MD: Fransisca Connors, PA-C   Chief Complaint: AF  History of Present Illness:    [name] is a 76 y.o. female who presents for an evaluation of AF at the request of Oda Kilts. Their medical history includes CAD s/p prior PCI, GERD, HTN, MVP, pAF previously followed by Dr Rayann Heman. Patient had an ablation in 2012. At the last appointment 10/24/2021 with Jonni Sanger she was in sinus rhythm.  She has a planned EGD/colonoscopy with Dr Hilarie Fredrickson to further investigate anemia. A pre-op clearance appointment was requested by Dr Vena Rua office.   Today the patient tells me she has not been having any chest pain, shortness of breath, dizziness, lightheadedness.  No heart failure symptoms.     Past Medical History:  Diagnosis Date   Anticoagulated    Pradaxa   Bilateral leg pain    Right leg has greater pain than the left.   Coronary artery disease cardiologist-- dr Rayann Heman   a.  s/p Xience DES to RCA 04/2009;   b. TEE 2/12: EF 40%, Large PFO;  c.  Lexiscan Myoview 05/2012: EF 69%, no ischemia. LHC (05/2012):  Ostial diagonal 30-40%, proximal mid ramus intermedius 40-50%, RCA stent patent with 40-50% after stent, then 40%, distal RCA 40-50%, EF 55-65%. Medical therapy continued.;  d.  Carlton Adam Myoview (06/2013):  No ischemia, EF 83%, normal study   Full dentures    GERD (gastroesophageal reflux disease)    History of basal cell carcinoma excision    scalp   History of hiatal hernia    History of kidney stones 51 years ago   x1   History of recurrent UTIs    Hypertension    IFG (impaired fasting glucose)    MVP (mitral valve prolapse)    mild per last echo 04-03-2017 in epic   OA (osteoarthritis)    knees , fingers   PAF (paroxysmal atrial  fibrillation) [facility])    cardiologist-- dr Rayann Heman   S/P ablation of atrial fibrillation 08/15/2010   S/P drug eluting coronary stent placement 04/04/2009   Spinal stenosis    Tremor    Wears glasses     Past Surgical History:  Procedure Laterality Date   ABDOMINAL EXPOSURE N/A 05/11/2020   Procedure: ABDOMINAL EXPOSURE;  Surgeon: Rosetta Posner, MD;  Location: [place];  Service: Vascular;  Laterality: N/A;   ANAL RECTAL MANOMETRY N/A 01/11/2014   Procedure: ANAL RECTAL MANOMETRY;  Surgeon: Leighton Ruff, MD;  Location: [facility];  Service: Endoscopy;  Laterality: N/A;   APPENDECTOMY  1973   BACK SURGERY     laminectomy times 2; 2000 and 2001   BUNIONECTOMY Bilateral 2013   CARDIAC CATHETERIZATION  10-24-2010   dr allred   singl-vessel CAD with patent stent mRCA/  moderate disease mRCA beyond stent segment/  normal lvsf   CARDIAC ELECTROPHYSIOLOGY MAPPING AND ABLATION  08-15-2010  dr allred   CARDIOVASCULAR STRESS TEST  06-15-2013  dr allred   normal perfusion study/  no ischemia/  ef 83%   CARPAL TUNNEL RELEASE Right 25 years ago   CATARACT EXTRACTION W/ INTRAOCULAR LENS  IMPLANT, BILATERAL     CHOLECYSTECTOMY OPEN  [place]  04-04-2009  dr Darnell Level brodie   PCI and DES x1 to  mRCA/  mLAD 40%, pCX 30%, pRCA 30%/  normal LVF   FLEXIBLE SIGMOIDOSCOPY N/A 07/07/2013   Procedure: FLEXIBLE SIGMOIDOSCOPY;  Surgeon: Winfield Cunas., MD;  Location: [facility];  Service: Endoscopy;  Laterality: N/A;  unprepped   LEFT HEART CATH AND CORONARY ANGIOGRAPHY N/A 10/26/2019   Procedure: LEFT HEART CATH AND CORONARY ANGIOGRAPHY;  Surgeon: Martinique, Peter M, MD;  Location: [place] [facility];  Service: Cardiovascular;  Laterality: N/A;   LEFT HEART CATHETERIZATION WITH CORONARY ANGIOGRAM N/A 05/09/2012   Procedure: LEFT HEART CATHETERIZATION WITH CORONARY ANGIOGRAM;  Surgeon: Renee Bow, MD;  Location: [facility];  Service: Cardiovascular;   Laterality: N/A;   MOHS SURGERY  2009   scalp   PILONIDAL CYST EXCISION  2009;   2000;   [place] N/A 01/11/2014   Procedure: RECTAL ULTRASOUND;  Surgeon: Leighton Ruff, MD;  Location: [facility];  Service: Endoscopy;  Laterality: N/A;   TOTAL KNEE ARTHROPLASTY Right 09/07/2014   Procedure: RIGHT TOTAL KNEE ARTHROPLASTY;  Surgeon: Paralee Cancel, MD;  Location: [facility];  Service: Orthopedics;  Laterality: Right;   TOTAL KNEE ARTHROPLASTY Left 12/20/2015   Procedure: LEFT TOTAL KNEE ARTHROPLASTY;  Surgeon: Paralee Cancel, MD;  Location: [facility];  Service: Orthopedics;  Laterality: Left;   TRANSTHORACIC ECHOCARDIOGRAM  04-03-2017   dr allred   Leory Plowman 55-60%/  mild MVP anterior leaflet (valve area 2.1cm^2) with mild regurg. , no stenosis/  trivial TR and PR   VAGINAL HYSTERECTOMY  1975   W/ BILATERAL SALPINOOPHOORECTOMY   VAGUS NERVE STIMULATOR INSERTION N/A 03/10/2014   Procedure: IMPLANTATION OF SACRAL  NERVE STIMULATOR ;  Surgeon: Leighton Ruff, MD;  Location: [place];  Service: General;  Laterality: N/A;  Sacral. [employer]    Current Medications: Current Meds  Medication Sig   dabigatran (PRADAXA) 150 MG CAPS capsule Take 1 capsule (150 mg total) by mouth 2 (two) times daily.   diphenhydrAMINE (BENADRYL) 25 mg capsule Take 25 mg by mouth every 6 (six) hours as needed.   hydrochlorothiazide (HYDRODIURIL) 12.5 MG tablet Take 1 tablet (12.5 mg total) by mouth daily.   isosorbide mononitrate (IMDUR) 30 MG 24 hr tablet Take 30 mg by mouth daily.   lisinopril (PRINIVIL,ZESTRIL) 10 MG tablet Take 1 tablet (10 mg total) by mouth daily.   metoprolol succinate (TOPROL-XL) 25 MG 24 hr tablet Take 1 tablet (25 mg total) by mouth 2 (two) times daily. Please make overdue appt with Dr. Rayann Heman before anymore refills. Thank you 1st attempt   nitroGLYCERIN (NITROSTAT) 0.4 MG SL tablet DISSOLVE 1 TABLET UNDER THE TONGUE EVERY 5 MINUTES AS NEEDED FOR CHEST PAIN   pantoprazole (PROTONIX)  40 MG tablet Take 1 tablet (40 mg total) by mouth 2 (two) times daily. NEEDS VISIT FOR FURTHER REFILLS   PEG-KCl-NaCl-NaSulf-Na Asc-C (PLENVU) 140 g SOLR Take as directed from the Prep Instructions that were sent to you   PRADAXA 150 MG CAPS capsule TAKE 1 CAPSULE TWICE A DAY   primidone (MYSOLINE) 50 MG tablet Take 2 tablets (100 mg total) by mouth at bedtime.   rosuvastatin (CRESTOR) 40 MG tablet Take 1 tablet (40 mg total) by mouth daily. Please call to schedule appointment for future refills. Final Attempt   traZODone (DESYREL) 50 MG tablet Take 150 mg by mouth at bedtime as needed.   [DISCONTINUED] ferrous sulfate 325 (65 FE) MG tablet Take 1 tablet (  325 mg total) by mouth daily with breakfast.   [DISCONTINUED] gabapentin (NEURONTIN) 300 MG capsule Take 300 mg by mouth in the morning and at bedtime.     Allergies:   Cephalexin   Social History   Socioeconomic History   Marital status: Married    Spouse name: Not on file   Number of children: 2   Years of education: HS   Highest education level: Not on file  Occupational History   Occupation: retired    Fish farm manager: RETIRED  Tobacco Use   Smoking status: Former    Packs/day: 0.50    Years: 20.00    Total pack years: 10.00    Types: Cigarettes    Quit date: 07/02/1988    Years since quitting: 33.8   Smokeless tobacco: Never  Vaping Use   Vaping Use: Never used  Substance and Sexual Activity   Alcohol use: No   Drug use: Never   Sexual activity: Not Currently    Birth control/protection: Surgical    Comment: 1st intercourse 30 yo-1 partner  Other Topics Concern   Not on file  Social History Narrative   Not on file   Social Determinants of Health   Financial Resource Strain: Not on file  Food Insecurity: Not on file  Transportation Needs: Not on file  Physical Activity: Not on file  Stress: Not on file  Social Connections: Not on file     Family History: The patient's family history includes Coronary artery disease  in her father; Heart disease in her brother, mother, and sister.  ROS:   Please see the history of present illness.    All other systems reviewed and are negative.  EKGs/Labs/Other Studies Reviewed:    The following studies were reviewed today:  11/05/2019 Echo EF 55 RV normal Mild MR   EKG:  The EKG 04/06/2022 shows sinus rhythm   Recent Labs: 03/15/2022: ALT 9 04/05/2022: BUN 14; Creatinine, Ser 1.48; Hemoglobin 9.6; Platelets 230; Potassium 3.6; Sodium 136  Recent Lipid Panel    Component Value Date/Time   CHOL (H) 04/01/2009 1601    218        ATP III CLASSIFICATION:  <200     mg/dL   Desirable  200-239  mg/dL   Borderline High  >=240    mg/dL   High          TRIG 295 (H) 04/01/2009 1601   HDL 33 (L) 04/01/2009 1601   CHOLHDL 6.6 04/01/2009 1601   VLDL 59 (H) 04/01/2009 1601   LDLCALC (H) 04/01/2009 1601    126        Total Cholesterol/HDL:CHD Risk Coronary Heart Disease Risk Table                     Men   Women  1/2 Average Risk   3.4   3.3  Average Risk       5.0   4.4  2 X Average Risk   9.6   7.1  3 X Average Risk  23.4   11.0        Use the calculated Patient Ratio above and the CHD Risk Table to determine the patient's CHD Risk.        ATP III CLASSIFICATION (LDL):  <100     mg/dL   Optimal  100-129  mg/dL   Near or Above                    Optimal  130-159  mg/dL   Borderline  160-189  mg/dL   High  >190     mg/dL   Very High    Physical Exam:    VS:  BP 124/66   Pulse 100   Ht '5\' 2"'$  (1.575 m)   Wt 185 lb (83.9 kg)   BMI 33.84 kg/m     Wt Readings from Last 3 Encounters:  04/25/22 185 lb (83.9 kg)  04/05/22 195 lb 1.7 oz (88.5 kg)  03/15/22 195 lb (88.5 kg)     GEN:  Well nourished, well developed in no acute distress HEENT: Normal NECK: No JVD; No carotid bruits LYMPHATICS: No lymphadenopathy CARDIAC: RRR, no murmurs, rubs, gallops.  Heart rate 100 bpm RESPIRATORY:  Clear to auscultation without rales, wheezing or rhonchi   ABDOMEN: Soft, non-tender, non-distended MUSCULOSKELETAL:  No edema; No deformity  SKIN: Warm and dry NEUROLOGIC:  Alert and oriented x 3 PSYCHIATRIC:  Normal affect       ASSESSMENT:    1. Paroxysmal atrial fibrillation (HCC)   2. Coronary artery disease involving native coronary artery of native heart without angina pectoris   3. Essential hypertension    PLAN:    In order of problems listed above:  #Paroxysmal atrial fibrillation No recurrent episodes after her prior ablation.  On Pradaxa for stroke prophylaxis.  #Coronary artery disease No ischemic symptoms today.  Left heart catheterization in April 2021 showed nonobstructive coronary artery disease and a normal LVEDP.  #Hypertension At goal today.  Recommend checking blood pressures 1-2 times per week at home and recording the values.  Recommend bringing these recordings to the primary care physician.  #Preop clearance Ms. Blount's perioperative risk of a major cardiac event is 0.9% according to the Revised Cardiac Risk Index (RCRI).  Therefore, she is at low risk for perioperative complications.   Her functional capacity is good. Recommendations: According to ACC/AHA guidelines, no further cardiovascular testing needed.  The patient may proceed to surgery at acceptable risk.   Antiplatelet and/or Anticoagulation Recommendations: Pradaxa (Dabigatran) can be held for 2 days prior to surgery.  Please resume post op when felt to be safe.      Medication Adjustments/Labs and Tests Ordered: Current medicines are reviewed at length with the patient today.  Concerns regarding medicines are outlined above.  No orders of the defined types were placed in this encounter.  No orders of the defined types were placed in this encounter.    Signed, Hilton Cork. Quentin Ore, MD, [facility], [facility] 04/25/2022 9:11 PM    Electrophysiology [facility]

## 2022-04-26 ENCOUNTER — Telehealth: Payer: Self-pay | Admitting: Internal Medicine

## 2022-04-26 NOTE — Telephone Encounter (Signed)
How about November 6?  I have a 10 AM opening.  I could start at 730 and you can block the 10 AM and place her on for EGD/colon at 730.

## 2022-04-26 NOTE — Telephone Encounter (Signed)
Patient upset that her endo/colon is so far out-12/7. Looked for a sooner date and did not find one. Pt states her stomach and her right side hurt. She is requesting something to help with the discomfort. She reports the pain on her side is constant and at night sometimes she can't even lay on that side due to the pain. She is taking her protonix but states it is doing nothing. Please advise.

## 2022-04-26 NOTE — Telephone Encounter (Signed)
Patient has been cleared by cardiology. Scheduled her for procedure 12/7. She states she is in sever pain and is requesting medication. I was only able to schedule her for the procedure as she kept getting upset that I couldn't assist her for her pain. If she is needed a pre-visit I was unable to do so for the argumentative behavior. Please advise.

## 2022-04-27 ENCOUNTER — Other Ambulatory Visit: Payer: Self-pay

## 2022-04-27 DIAGNOSIS — D649 Anemia, unspecified: Secondary | ICD-10-CM

## 2022-04-27 DIAGNOSIS — Z1211 Encounter for screening for malignant neoplasm of colon: Secondary | ICD-10-CM

## 2022-04-27 NOTE — Telephone Encounter (Signed)
Spoke with pt and she took the appt on 05/07/22 at 7:30am, she knows to arrive at 6:30am. 10am slot blocked. New instructions sent to pt via mychart per her request.

## 2022-05-01 ENCOUNTER — Ambulatory Visit: Payer: Medicare Other | Admitting: Physician Assistant

## 2022-05-03 NOTE — Telephone Encounter (Signed)
ECL has been scheduled for 05-07-22.

## 2022-05-07 ENCOUNTER — Encounter: Payer: Self-pay | Admitting: Internal Medicine

## 2022-05-07 ENCOUNTER — Ambulatory Visit (AMBULATORY_SURGERY_CENTER): Payer: Medicare Other | Admitting: Internal Medicine

## 2022-05-07 VITALS — BP 126/52 | HR 88 | Temp 95.3°F | Resp 15 | Ht 61.0 in | Wt 195.0 lb

## 2022-05-07 DIAGNOSIS — K449 Diaphragmatic hernia without obstruction or gangrene: Secondary | ICD-10-CM | POA: Diagnosis not present

## 2022-05-07 DIAGNOSIS — K5520 Angiodysplasia of colon without hemorrhage: Secondary | ICD-10-CM | POA: Diagnosis not present

## 2022-05-07 DIAGNOSIS — D509 Iron deficiency anemia, unspecified: Secondary | ICD-10-CM | POA: Diagnosis not present

## 2022-05-07 DIAGNOSIS — D128 Benign neoplasm of rectum: Secondary | ICD-10-CM | POA: Diagnosis not present

## 2022-05-07 DIAGNOSIS — K5730 Diverticulosis of large intestine without perforation or abscess without bleeding: Secondary | ICD-10-CM

## 2022-05-07 DIAGNOSIS — K317 Polyp of stomach and duodenum: Secondary | ICD-10-CM | POA: Diagnosis not present

## 2022-05-07 MED ORDER — SODIUM CHLORIDE 0.9 % IV SOLN: 500.0000 mL | Freq: Once | INTRAVENOUS | Status: DC

## 2022-05-07 NOTE — Patient Instructions (Signed)
Discharge instructions given. Handouts on polyps,diverticulosis and Hiatal Hernia. Ok to resume Pradaxa tomorrow with management per prescribing provider. Resume previous medications. YOU HAD AN ENDOSCOPIC PROCEDURE TODAY AT [facility]:   Refer to the procedure report that was given to you for any specific questions about what was found during the examination.  If the procedure report does not answer your questions, please call your gastroenterologist to clarify.  If you requested that your care partner not be given the details of your procedure findings, then the procedure report has been included in a sealed envelope for you to review at your convenience later.  YOU SHOULD EXPECT: Some feelings of bloating in the abdomen. Passage of more gas than usual.  Walking can help get rid of the air that was put into your GI tract during the procedure and reduce the bloating. If you had a lower endoscopy (such as a colonoscopy or flexible sigmoidoscopy) you may notice spotting of blood in your stool or on the toilet paper. If you underwent a bowel prep for your procedure, you may not have a normal bowel movement for a few days.  Please Note:  You might notice some irritation and congestion in your nose or some drainage.  This is from the oxygen used during your procedure.  There is no need for concern and it should clear up in a day or so.  SYMPTOMS TO REPORT IMMEDIATELY:  Following lower endoscopy (colonoscopy or flexible sigmoidoscopy):  Excessive amounts of blood in the stool  Significant tenderness or worsening of abdominal pains  Swelling of the abdomen that is new, acute  Fever of 100F or higher  Following upper endoscopy (EGD)  Vomiting of blood or coffee ground material  New chest pain or pain under the shoulder blades  Painful or persistently difficult swallowing  New shortness of breath  Fever of 100F or higher  Black, tarry-looking stools  For urgent or emergent  issues, a gastroenterologist can be reached at any hour by calling [phone]. Do not use MyChart messaging for urgent concerns.    DIET:  We do recommend a small meal at first, but then you may proceed to your regular diet.  Drink plenty of fluids but you should avoid alcoholic beverages for 24 hours.  ACTIVITY:  You should plan to take it easy for the rest of today and you should NOT DRIVE or use heavy machinery until tomorrow (because of the sedation medicines used during the test).    FOLLOW UP: Our staff will call the number listed on your records the next business day following your procedure.  We will call around 7:15- 8:00 am to check on you and address any questions or concerns that you may have regarding the information given to you following your procedure. If we do not reach you, we will leave a message.     If any biopsies were taken you will be contacted by phone or by letter within the next 1-3 weeks.  Please call us at [phone] if you have not heard about the biopsies in 3 weeks.    SIGNATURES/CONFIDENTIALITY: You and/or your care partner have signed paperwork which will be entered into your electronic medical record.  These signatures attest to the fact that that the information above on your After Visit Summary has been reviewed and is understood.  Full responsibility of the confidentiality of this discharge information lies with you and/or your care-partner.

## 2022-05-07 NOTE — Progress Notes (Signed)
To pacu, VSS. Report to [email or web address] 

## 2022-05-07 NOTE — Op Note (Signed)
[facility] [facility] Patient Name: [name] Procedure Date: 05/07/2022 7:53 AM MRN: [id number] Endoscopist: Jerene Bears , MD, [id number] Age: 76 Referring MD:  Date of Birth: 1946/02/07 Gender: Female Account #: [id number] Procedure:                Upper GI endoscopy Indications:              Iron deficiency anemia Medicines:                Monitored Anesthesia Care Procedure:                Pre-Anesthesia Assessment:                           - Prior to the procedure, a History and Physical                            was performed, and patient medications and                            allergies were reviewed. The patient's tolerance of                            previous anesthesia was also reviewed. The risks                            and benefits of the procedure and the sedation                            options and risks were discussed with the patient.                            All questions were answered, and informed consent                            was obtained. Prior Anticoagulants: The patient has                            taken Pradaxa (dabigatran), last dose was 3 days                            prior to procedure. ASA Grade Assessment: III - A                            patient with severe systemic disease. After                            reviewing the risks and benefits, the patient was                            deemed in satisfactory condition to undergo the                            procedure.  After obtaining informed consent, the endoscope was                            passed under direct vision. Throughout the                            procedure, the patient's blood pressure, pulse, and                            oxygen saturations were monitored continuously. The                            Endoscope was introduced through the mouth, and                            advanced to the second part of duodenum. The upper                             GI endoscopy was accomplished without difficulty.                            The patient tolerated the procedure well. Scope In: Scope Out: Findings:                 The examined esophagus was normal.                           A 1 cm hiatal hernia was present.                           The entire examined stomach was normal. Biopsies                            were taken with a cold forceps for histology and                            Helicobacter pylori testing.                           The examined duodenum was normal. Biopsies for                            histology were taken with a cold forceps for                            evaluation of celiac disease. Complications:            No immediate complications. Estimated Blood Loss:     Estimated blood loss was minimal. Impression:               - Normal esophagus.                           - 1 cm hiatal hernia.                           -  Normal stomach. Biopsied.                           - Normal examined duodenum. Biopsied. Recommendation:           - Patient has a contact number available for                            emergencies. The signs and symptoms of potential                            delayed complications were discussed with the                            patient. Return to normal activities tomorrow.                            Written discharge instructions were provided to the                            patient.                           - Resume previous diet.                           - Continue present medications.                           - Await pathology results.                           - See the other procedure note for documentation of                            additional recommendations. Jerene Bears, MD 05/07/2022 8:30:23 AM This report has been signed electronically.

## 2022-05-07 NOTE — Progress Notes (Signed)
Called to room to assist during endoscopic procedure.  Patient ID and intended procedure confirmed with present staff. Received instructions for my participation in the procedure from the performing physician.  

## 2022-05-07 NOTE — Op Note (Signed)
[facility] Patient Name: [name] Procedure Date: 05/07/2022 7:51 AM MRN: [id number] Endoscopist: Jerene Bears , MD, [id number] Age: 76 Referring MD:  Date of Birth: 05-Nov-1945 Gender: Female Account #: [id number] Procedure:                Colonoscopy Indications:              Iron deficiency anemia Medicines:                Monitored Anesthesia Care Procedure:                Pre-Anesthesia Assessment:                           - Prior to the procedure, a History and Physical                            was performed, and patient medications and                            allergies were reviewed. The patient's tolerance of                            previous anesthesia was also reviewed. The risks                            and benefits of the procedure and the sedation                            options and risks were discussed with the patient.                            All questions were answered, and informed consent                            was obtained. Prior Anticoagulants: The patient has                            taken Pradaxa (dabigatran), last dose was 3 days                            prior to procedure. ASA Grade Assessment: III - A                            patient with severe systemic disease. After                            reviewing the risks and benefits, the patient was                            deemed in satisfactory condition to undergo the                            procedure.  After obtaining informed consent, the colonoscope                            was passed under direct vision. Throughout the                            procedure, the patient's blood pressure, pulse, and                            oxygen saturations were monitored continuously. The                            Olympus PCF-H190DL ([id number]) Colonoscope was                            introduced through the anus and advanced to the                             terminal ileum. The colonoscopy was performed                            without difficulty. The patient tolerated the                            procedure well. The quality of the bowel                            preparation was good. The terminal ileum, ileocecal                            valve, appendiceal orifice, and rectum were                            photographed. Scope In: 8:09:14 AM Scope Out: 8:24:00 AM Scope Withdrawal Time: 0 hours 11 minutes 49 seconds  Total Procedure Duration: 0 hours 14 minutes 46 seconds  Findings:                 The digital rectal exam was normal.                           The terminal ileum appeared normal.                           The terminal ileum contained a single small                            diverticulum.                           A few small angioectasias with typical arborization                            were found in the ascending colon.  A 9 mm polyp was found in the rectum. The polyp was                            sessile. The polyp was removed with a cold snare.                            Resection and retrieval were complete.                           A few small-mouthed diverticula were found in the                            sigmoid colon.                           The retroflexed view of the distal rectum and anal                            verge was normal and showed no anal or rectal                            abnormalities. Complications:            No immediate complications. Estimated Blood Loss:     Estimated blood loss was minimal. Impression:               - The examined portion of the ileum was normal.                           - Ileal diverticulum.                           - A few colonic (ascending) angioectasias. This is                            the most likely source of IDA.                           - One 9 mm polyp in the rectum, removed with a cold                             snare. Resected and retrieved.                           - Mild diverticulosis in the sigmoid colon.                           - The distal rectum and anal verge are normal on                            retroflexion view. Recommendation:           - Patient has a contact number available for  emergencies. The signs and symptoms of potential                            delayed complications were discussed with the                            patient. Return to normal activities tomorrow.                            Written discharge instructions were provided to the                            patient.                           - Resume previous diet.                           - Continue present medications. Continue oral iron                            and plans to follow CBC and iron studies. If still                            low on next check with oral iron then IV iron is                            recommended. If refractory anemia then consider                            hospital outpatient colonoscopy for APC ablation of                            right colonic angioectasias.                           - Ok to resume Pradaxa tomorrow with management per                            prescribing provider.                           - Await pathology results.                           - No recommendation at this time regarding repeat                            colonoscopy due to age. Jerene Bears, MD 05/07/2022 8:34:46 AM This report has been signed electronically.

## 2022-05-07 NOTE — Progress Notes (Signed)
GASTROENTEROLOGY PROCEDURE H&P NOTE   Primary Care Physician: Fransisca Connors, PA-C    Reason for Procedure:   IDA   Plan:    EGD and colonoscopy  Patient is appropriate for endoscopic procedure(s) in the ambulatory ([facility]) setting.  The nature of the procedure, as well as the risks, benefits, and alternatives were carefully and thoroughly reviewed with the patient. Ample time for discussion and questions allowed. The patient understood, was satisfied, and agreed to proceed.     HPI: [name] is a 76 y.o. female who presents for EGD and colonoscopy.  Medical history as below.  Tolerated the prep.  No recent chest pain or shortness of breath.  No abdominal pain today.  Past Medical History:  Diagnosis Date   Anticoagulated    Pradaxa   Bilateral leg pain    Right leg has greater pain than the left.   Coronary artery disease cardiologist-- dr Rayann Heman   a.  s/p Xience DES to RCA 04/2009;   b. TEE 2/12: EF 40%, Large PFO;  c.  Lexiscan Myoview 05/2012: EF 69%, no ischemia. LHC (05/2012):  Ostial diagonal 30-40%, proximal mid ramus intermedius 40-50%, RCA stent patent with 40-50% after stent, then 40%, distal RCA 40-50%, EF 55-65%. Medical therapy continued.;  d.  Carlton Adam Myoview (06/2013):  No ischemia, EF 83%, normal study   Full dentures    GERD (gastroesophageal reflux disease)    History of basal cell carcinoma excision    scalp   History of hiatal hernia    History of kidney stones 51 years ago   x1   History of recurrent UTIs    Hypertension    IFG (impaired fasting glucose)    MVP (mitral valve prolapse)    mild per last echo 04-03-2017 in epic   OA (osteoarthritis)    knees , fingers   PAF (paroxysmal atrial fibrillation) [facility])    cardiologist-- dr Rayann Heman   S/P ablation of atrial fibrillation 08/15/2010   S/P drug eluting coronary stent placement 04/04/2009   Spinal stenosis    Tremor    Wears glasses     Past Surgical History:  Procedure Laterality  Date   ABDOMINAL EXPOSURE N/A 05/11/2020   Procedure: ABDOMINAL EXPOSURE;  Surgeon: Rosetta Posner, MD;  Location: [facility];  Service: Vascular;  Laterality: N/A;   ANAL RECTAL MANOMETRY N/A 01/11/2014   Procedure: ANAL RECTAL MANOMETRY;  Surgeon: Leighton Ruff, MD;  Location: [facility];  Service: Endoscopy;  Laterality: N/A;   APPENDECTOMY  1973   BACK SURGERY     laminectomy times 2; 2000 and 2001   BUNIONECTOMY Bilateral 2013   CARDIAC CATHETERIZATION  10-24-2010   dr allred   singl-vessel CAD with patent stent mRCA/  moderate disease mRCA beyond stent segment/  normal lvsf   CARDIAC ELECTROPHYSIOLOGY MAPPING AND ABLATION  08-15-2010  dr allred   CARDIOVASCULAR STRESS TEST  06-15-2013  dr allred   normal perfusion study/  no ischemia/  ef 83%   CARPAL TUNNEL RELEASE Right 25 years ago   CATARACT EXTRACTION W/ INTRAOCULAR LENS  IMPLANT, BILATERAL     CHOLECYSTECTOMY OPEN  1973   W/  APPENDECTOMY   CORONARY ANGIOPLASTY WITH STENT PLACEMENT  04-04-2009  dr Darnell Level brodie   PCI and DES x1 to  mRCA/  mLAD 40%, pCX 30%, pRCA 30%/  normal LVF   FLEXIBLE SIGMOIDOSCOPY N/A 07/07/2013   Procedure: FLEXIBLE SIGMOIDOSCOPY;  Surgeon: Winfield Cunas., MD;  Location: [facility];  Service: Endoscopy;  Laterality: N/A;  unprepped   LEFT HEART CATH AND CORONARY ANGIOGRAPHY N/A 10/26/2019   Procedure: LEFT HEART CATH AND CORONARY ANGIOGRAPHY;  Surgeon: Martinique, Peter M, MD;  Location: [facility];  Service: Cardiovascular;  Laterality: N/A;   LEFT HEART CATHETERIZATION WITH CORONARY ANGIOGRAM N/A 05/09/2012   Procedure: LEFT HEART CATHETERIZATION WITH CORONARY ANGIOGRAM;  Surgeon: Hillary Bow, MD;  Location: [facility] C[facility] LAB;  Service: Cardiovascular;  Laterality: N/A;   MOHS SURGERY  2009   scalp   PILONIDAL CYST EXCISION  2009;   2000;   [place] N/A 01/11/2014   Procedure: RECTAL ULTRASOUND;  Surgeon: Leighton Ruff, MD;  Location: [facility];  Service: Endoscopy;  Laterality:  N/A;   TOTAL KNEE ARTHROPLASTY Right 09/07/2014   Procedure: RIGHT TOTAL KNEE ARTHROPLASTY;  Surgeon: Paralee Cancel, MD;  Location: [facility];  Service: Orthopedics;  Laterality: Right;   TOTAL KNEE ARTHROPLASTY Left 12/20/2015   Procedure: LEFT TOTAL KNEE ARTHROPLASTY;  Surgeon: Paralee Cancel, MD;  Location: [facility];  Service: Orthopedics;  Laterality: Left;   TRANSTHORACIC ECHOCARDIOGRAM  04-03-2017   dr allred   Leory Plowman 55-60%/  mild MVP anterior leaflet (valve area 2.1cm^2) with mild regurg. , no stenosis/  trivial TR and PR   VAGINAL HYSTERECTOMY  1975   W/ BILATERAL SALPINOOPHOORECTOMY   VAGUS NERVE STIMULATOR INSERTION N/A 03/10/2014   Procedure: IMPLANTATION OF SACRAL  NERVE STIMULATOR ;  Surgeon: Leighton Ruff, MD;  Location: [facility];  Service: General;  Laterality: N/A;  Sacral. [employer]    Prior to Admission medications   Medication Sig Start Date End Date Taking? Authorizing Provider  diphenhydrAMINE (BENADRYL) 25 mg capsule Take 25 mg by mouth every 6 (six) hours as needed.   Yes [provider]  hydrochlorothiazide (HYDRODIURIL) 12.5 MG tablet Take 1 tablet (12.5 mg total) by mouth daily. 03/28/17  Yes Allred, Jeneen Rinks, MD  isosorbide mononitrate (IMDUR) 30 MG 24 hr tablet Take 30 mg by mouth daily.   Yes [provider]  lisinopril (PRINIVIL,ZESTRIL) 10 MG tablet Take 1 tablet (10 mg total) by mouth daily. 08/27/17  Yes Allred, Jeneen Rinks, MD  metoprolol succinate (TOPROL-XL) 25 MG 24 hr tablet Take 1 tablet (25 mg total) by mouth 2 (two) times daily. Please make overdue appt with Dr. Rayann Heman before anymore refills. Thank you 1st attempt 05/18/21  Yes Allred, Jeneen Rinks, MD  pantoprazole (PROTONIX) 40 MG tablet Take 1 tablet (40 mg total) by mouth 2 (two) times daily. NEEDS VISIT FOR FURTHER REFILLS 01/19/20  Yes Becker Christopher, Lajuan Lines, MD  primidone (MYSOLINE) 50 MG tablet Take 2 tablets (100 mg total) by mouth at bedtime. 09/07/19  Yes Ward Givens, NP  rosuvastatin (CRESTOR) 40  MG tablet Take 1 tablet (40 mg total) by mouth daily. Please call to schedule appointment for future refills. Final Attempt 07/25/21  Yes Allred, Jeneen Rinks, MD  traZODone (DESYREL) 50 MG tablet Take 150 mg by mouth at bedtime as needed. 03/14/21  Yes [provider]  dabigatran (PRADAXA) 150 MG CAPS capsule Take 1 capsule (150 mg total) by mouth 2 (two) times daily. 04/09/22   Allred, Jeneen Rinks, MD  nitroGLYCERIN (NITROSTAT) 0.4 MG SL tablet DISSOLVE 1 TABLET UNDER THE TONGUE EVERY 5 MINUTES AS NEEDED FOR CHEST PAIN 07/06/19   Josue Hector, MD    Current Outpatient Medications  Medication Sig Dispense Refill   diphenhydrAMINE (BENADRYL) 25 mg capsule Take 25 mg by mouth every 6 (six) hours as  needed.     hydrochlorothiazide (HYDRODIURIL) 12.5 MG tablet Take 1 tablet (12.5 mg total) by mouth daily. 30 tablet 11   isosorbide mononitrate (IMDUR) 30 MG 24 hr tablet Take 30 mg by mouth daily.     lisinopril (PRINIVIL,ZESTRIL) 10 MG tablet Take 1 tablet (10 mg total) by mouth daily. 90 tablet 1   metoprolol succinate (TOPROL-XL) 25 MG 24 hr tablet Take 1 tablet (25 mg total) by mouth 2 (two) times daily. Please make overdue appt with Dr. Rayann Heman before anymore refills. Thank you 1st attempt 60 tablet 0   pantoprazole (PROTONIX) 40 MG tablet Take 1 tablet (40 mg total) by mouth 2 (two) times daily. NEEDS VISIT FOR FURTHER REFILLS 180 tablet 0   primidone (MYSOLINE) 50 MG tablet Take 2 tablets (100 mg total) by mouth at bedtime. 180 tablet 3   rosuvastatin (CRESTOR) 40 MG tablet Take 1 tablet (40 mg total) by mouth daily. Please call to schedule appointment for future refills. Final Attempt 15 tablet 0   traZODone (DESYREL) 50 MG tablet Take 150 mg by mouth at bedtime as needed.     dabigatran (PRADAXA) 150 MG CAPS capsule Take 1 capsule (150 mg total) by mouth 2 (two) times daily. 180 capsule 3   nitroGLYCERIN (NITROSTAT) 0.4 MG SL tablet DISSOLVE 1 TABLET UNDER THE TONGUE EVERY 5 MINUTES AS NEEDED FOR  CHEST PAIN 75 tablet 3   Current Facility-Administered Medications  Medication Dose Route Frequency Provider Last Rate Last Admin   0.9 %  sodium chloride infusion  500 mL Intravenous Once Tequlia Gonsalves, Lajuan Lines, MD        Allergies as of 05/07/2022 - Review Complete 05/07/2022  Allergen Reaction Noted   Cephalexin Nausea Only 08/13/2014    Family History  Problem Relation Age of Onset   Heart disease Mother    Coronary artery disease Father    Heart disease Sister    Heart disease Brother    Colon cancer Neg Hx    Esophageal cancer Neg Hx    Rectal cancer Neg Hx    Stomach cancer Neg Hx     Social History   Socioeconomic History   Marital status: Married    Spouse name: Not on file   Number of children: 2   Years of education: HS   Highest education level: Not on file  Occupational History   Occupation: retired    Fish farm manager: RETIRED  Tobacco Use   Smoking status: Former    Packs/day: 0.50    Years: 20.00    Total pack years: 10.00    Types: Cigarettes    Quit date: 07/02/1988    Years since quitting: 33.8   Smokeless tobacco: Never  Vaping Use   Vaping Use: Never used  Substance and Sexual Activity   Alcohol use: No   Drug use: Never   Sexual activity: Not Currently    Birth control/protection: Surgical    Comment: 1st intercourse 52 yo-1 partner  Other Topics Concern   Not on file  Social History Narrative   Not on file   Social Determinants of Health   Financial Resource Strain: Not on file  Food Insecurity: Not on file  Transportation Needs: Not on file  Physical Activity: Not on file  Stress: Not on file  Social Connections: Not on file  Intimate Partner Violence: Not on file    Physical Exam: Vital signs in last 24 hours: '@BP'$  132/77   Pulse (!) 102   Temp (!) 95.3  F (35.2 C) (Other (Comment)) Comment (Src): forehead  Ht '5\' 1"'$  (1.549 m)   Wt 195 lb (88.5 kg)   SpO2 100%   BMI 36.84 kg/m  GEN: NAD EYE: Sclerae anicteric ENT: MMM CV:  Non-tachycardic Pulm: CTA b/l GI: Soft, NT/ND NEURO:  Alert & Oriented x 3   Zenovia Jarred, MD [facility]  05/07/2022 7:43 AM

## 2022-05-08 ENCOUNTER — Telehealth: Payer: Self-pay

## 2022-05-08 NOTE — Telephone Encounter (Signed)
Left message on answering machine. 

## 2022-05-11 ENCOUNTER — Other Ambulatory Visit: Payer: Self-pay

## 2022-05-11 DIAGNOSIS — R109 Unspecified abdominal pain: Secondary | ICD-10-CM

## 2022-05-11 DIAGNOSIS — D509 Iron deficiency anemia, unspecified: Secondary | ICD-10-CM

## 2022-06-07 ENCOUNTER — Encounter: Payer: Medicare Other | Admitting: Internal Medicine

## 2022-07-11 ENCOUNTER — Other Ambulatory Visit (INDEPENDENT_AMBULATORY_CARE_PROVIDER_SITE_OTHER): Payer: Medicare Other

## 2022-07-11 DIAGNOSIS — R109 Unspecified abdominal pain: Secondary | ICD-10-CM | POA: Diagnosis not present

## 2022-07-11 DIAGNOSIS — D509 Iron deficiency anemia, unspecified: Secondary | ICD-10-CM | POA: Diagnosis not present

## 2022-07-11 LAB — IBC + FERRITIN
Ferritin: 17.9 ng/mL (ref 10.0–291.0)
Iron: 56 ug/dL (ref 42–145)
Saturation Ratios: 13.5 % — ABNORMAL LOW (ref 20.0–50.0)
TIBC: 414.4 ug/dL (ref 250.0–450.0)
Transferrin: 296 mg/dL (ref 212.0–360.0)

## 2022-07-11 LAB — CBC WITH DIFFERENTIAL/PLATELET
Basophils Absolute: 0 10*3/uL (ref 0.0–0.1)
Basophils Relative: 0.6 % (ref 0.0–3.0)
Eosinophils Absolute: 0.1 10*3/uL (ref 0.0–0.7)
Eosinophils Relative: 1.5 % (ref 0.0–5.0)
HCT: 31.8 % — ABNORMAL LOW (ref 36.0–46.0)
Hemoglobin: 10.3 g/dL — ABNORMAL LOW (ref 12.0–15.0)
Lymphocytes Relative: 22.5 % (ref 12.0–46.0)
Lymphs Abs: 1.6 10*3/uL (ref 0.7–4.0)
MCHC: 32.4 g/dL (ref 30.0–36.0)
MCV: 86.7 fl (ref 78.0–100.0)
Monocytes Absolute: 0.7 10*3/uL (ref 0.1–1.0)
Monocytes Relative: 10.1 % (ref 3.0–12.0)
Neutro Abs: 4.5 10*3/uL (ref 1.4–7.7)
Neutrophils Relative %: 65.3 % (ref 43.0–77.0)
Platelets: 192 10*3/uL (ref 150.0–400.0)
RBC: 3.67 Mil/uL — ABNORMAL LOW (ref 3.87–5.11)
RDW: 17.2 % — ABNORMAL HIGH (ref 11.5–15.5)
WBC: 6.9 10*3/uL (ref 4.0–10.5)

## 2022-08-17 LAB — BASIC METABOLIC PANEL
BUN: 27 — AB (ref 4–21)
CO2: 21 (ref 13–22)
Chloride: 101 (ref 99–108)
Creatinine: 1.6 — AB (ref 0.5–1.1)
Glucose: 100
Potassium: 3.9 mEq/L (ref 3.5–5.1)
Sodium: 141 (ref 137–147)

## 2022-08-17 LAB — CBC: RBC: 3.35 — AB (ref 3.87–5.11)

## 2022-08-17 LAB — HEPATIC FUNCTION PANEL
ALT: 24 U/L (ref 7–35)
AST: 27 (ref 13–35)
Alkaline Phosphatase: 91 (ref 25–125)
Bilirubin, Total: 0

## 2022-08-17 LAB — LIPID PANEL
Cholesterol: 172 (ref 0–200)
HDL: 36 (ref 35–70)
LDL Cholesterol: 100
Triglycerides: 210 — AB (ref 40–160)

## 2022-08-17 LAB — TSH: TSH: 1.65 (ref 0.41–5.90)

## 2022-08-17 LAB — COMPREHENSIVE METABOLIC PANEL
Albumin: 4.1 (ref 3.5–5.0)
Calcium: 8.9 (ref 8.7–10.7)
Globulin: 2
eGFR: 34

## 2022-08-17 LAB — CBC AND DIFFERENTIAL
HCT: 30 — AB (ref 36–46)
Hemoglobin: 9.9 — AB (ref 12.0–16.0)
Neutrophils Absolute: 3
Platelets: 201 10*3/uL (ref 150–400)
WBC: 4.7

## 2022-08-17 LAB — HEMOGLOBIN A1C: Hemoglobin A1C: 6.1

## 2022-10-11 ENCOUNTER — Other Ambulatory Visit: Payer: Self-pay

## 2022-10-11 DIAGNOSIS — D509 Iron deficiency anemia, unspecified: Secondary | ICD-10-CM

## 2022-10-15 ENCOUNTER — Telehealth: Payer: Self-pay | Admitting: Internal Medicine

## 2022-10-15 NOTE — Telephone Encounter (Signed)
PT is calling because she received a message saying she needed to come in for labs and she is confused as to why and wants to know what are the labs for. Please advise.

## 2022-10-15 NOTE — Telephone Encounter (Signed)
Spoke with [name] and let her know Dr. Rhea Belton wanted her to have repeat iron studies and blood count follow-up from the labs she had in January. She states she will come tomorrow.

## 2022-10-19 ENCOUNTER — Other Ambulatory Visit (INDEPENDENT_AMBULATORY_CARE_PROVIDER_SITE_OTHER): Payer: Medicare Other

## 2022-10-19 DIAGNOSIS — D509 Iron deficiency anemia, unspecified: Secondary | ICD-10-CM

## 2022-10-19 LAB — CBC WITH DIFFERENTIAL/PLATELET
Basophils Absolute: 0.1 10*3/uL (ref 0.0–0.1)
Basophils Relative: 1.2 % (ref 0.0–3.0)
Eosinophils Absolute: 0.1 10*3/uL (ref 0.0–0.7)
Eosinophils Relative: 2.4 % (ref 0.0–5.0)
HCT: 32.4 % — ABNORMAL LOW (ref 36.0–46.0)
Hemoglobin: 10.5 g/dL — ABNORMAL LOW (ref 12.0–15.0)
Lymphocytes Relative: 27.8 % (ref 12.0–46.0)
Lymphs Abs: 1.6 10*3/uL (ref 0.7–4.0)
MCHC: 32.3 g/dL (ref 30.0–36.0)
MCV: 84.8 fl (ref 78.0–100.0)
Monocytes Absolute: 0.7 10*3/uL (ref 0.1–1.0)
Monocytes Relative: 12 % (ref 3.0–12.0)
Neutro Abs: 3.3 10*3/uL (ref 1.4–7.7)
Neutrophils Relative %: 56.6 % (ref 43.0–77.0)
Platelets: 218 10*3/uL (ref 150.0–400.0)
RBC: 3.82 Mil/uL — ABNORMAL LOW (ref 3.87–5.11)
RDW: 16 % — ABNORMAL HIGH (ref 11.5–15.5)
WBC: 5.8 10*3/uL (ref 4.0–10.5)

## 2022-10-19 LAB — IBC + FERRITIN
Ferritin: 10.7 ng/mL (ref 10.0–291.0)
Iron: 26 ug/dL — ABNORMAL LOW (ref 42–145)
Saturation Ratios: 5.8 % — ABNORMAL LOW (ref 20.0–50.0)
TIBC: 446.6 ug/dL (ref 250.0–450.0)
Transferrin: 319 mg/dL (ref 212.0–360.0)

## 2022-10-22 ENCOUNTER — Telehealth: Payer: Self-pay | Admitting: Pharmacy Technician

## 2022-10-22 ENCOUNTER — Other Ambulatory Visit: Payer: Self-pay | Admitting: Internal Medicine

## 2022-10-22 DIAGNOSIS — D500 Iron deficiency anemia secondary to blood loss (chronic): Secondary | ICD-10-CM | POA: Insufficient documentation

## 2022-10-22 DIAGNOSIS — K5520 Angiodysplasia of colon without hemorrhage: Secondary | ICD-10-CM

## 2022-10-22 NOTE — Telephone Encounter (Signed)
Dr. Rhea Belton, [facility] note:  Patient will be scheduled as soon as possible  Auth Submission: NO AUTH NEEDED Site of care: Site of care: [facility] Payer: MEDICARE A/B & TRICARE Medication & CPT/J Code(s) submitted: Feraheme (ferumoxytol) [id number] Route of submission (phone, fax, portal):  Phone # Fax # Auth type: Buy/Bill Units/visits requested: 2 Reference number:  Approval from: 10/22/22 to 07/02/23

## 2022-10-23 ENCOUNTER — Telehealth: Payer: Self-pay

## 2022-10-23 NOTE — Telephone Encounter (Signed)
Received personal reminder.  Chart reviewed. Labs already completed.

## 2022-10-23 NOTE — Telephone Encounter (Signed)
-----   Message from Loretha Stapler, RN sent at 10/15/2022  2:34 PM EDT -----  ----- Message ----- From: Emeline Darling, RN Sent: 10/11/2022  12:00 AM EDT To: Emeline Darling, RN  Personal reminder sent 07/12/2022  Repeat CBC, IBC plus ferritin in 3 months with our lab: Per Dr. Rhea Belton

## 2022-10-24 ENCOUNTER — Other Ambulatory Visit: Payer: Self-pay

## 2022-10-24 DIAGNOSIS — D500 Iron deficiency anemia secondary to blood loss (chronic): Secondary | ICD-10-CM

## 2022-10-24 DIAGNOSIS — K5520 Angiodysplasia of colon without hemorrhage: Secondary | ICD-10-CM

## 2022-10-26 ENCOUNTER — Ambulatory Visit (INDEPENDENT_AMBULATORY_CARE_PROVIDER_SITE_OTHER): Payer: Medicare Other | Admitting: *Deleted

## 2022-10-26 VITALS — BP 130/81 | HR 74 | Temp 97.7°F | Resp 18 | Ht 62.0 in | Wt 175.8 lb

## 2022-10-26 DIAGNOSIS — D500 Iron deficiency anemia secondary to blood loss (chronic): Secondary | ICD-10-CM | POA: Diagnosis not present

## 2022-10-26 DIAGNOSIS — K5520 Angiodysplasia of colon without hemorrhage: Secondary | ICD-10-CM

## 2022-10-26 MED ORDER — SODIUM CHLORIDE 0.9 % IV SOLN
510.0000 mg | Freq: Once | INTRAVENOUS | Status: AC
  Administered 2022-10-26: 510 mg via INTRAVENOUS
  Filled 2022-10-26: qty 17

## 2022-10-26 MED ORDER — DIPHENHYDRAMINE HCL 25 MG PO CAPS
25.0000 mg | ORAL_CAPSULE | Freq: Once | ORAL | Status: AC
  Administered 2022-10-26: 25 mg via ORAL
  Filled 2022-10-26: qty 1

## 2022-10-26 MED ORDER — ACETAMINOPHEN 325 MG PO TABS
650.0000 mg | ORAL_TABLET | Freq: Once | ORAL | Status: AC
  Administered 2022-10-26: 650 mg via ORAL
  Filled 2022-10-26: qty 2

## 2022-10-26 NOTE — Progress Notes (Signed)
Diagnosis: Iron Deficiency Anemia  Provider:  Chilton Greathouse MD  Procedure: IV Infusion  IV Type: Peripheral, IV Location: R Antecubital  Feraheme (Ferumoxytol), Dose: 510 mg  Infusion Start Time: 1428 pm  Infusion Stop Time: 1449 pm  Post Infusion IV Care: Observation period completed and Peripheral IV Discontinued  Discharge: Condition: Good, Destination: Home . AVS Provided  Performed by:  Forrest Moron, RN

## 2022-10-30 ENCOUNTER — Telehealth: Payer: Self-pay | Admitting: Internal Medicine

## 2022-10-30 NOTE — Telephone Encounter (Signed)
PT is returning call after being out of town for sometime. Would like to discuss lab  results.

## 2022-10-31 NOTE — Telephone Encounter (Signed)
Left message for pt to call back.  Pt did not return call, will await further communication from pt.

## 2022-11-05 ENCOUNTER — Ambulatory Visit (INDEPENDENT_AMBULATORY_CARE_PROVIDER_SITE_OTHER): Payer: Medicare Other

## 2022-11-05 VITALS — BP 91/55 | HR 87 | Temp 99.0°F | Resp 16 | Ht 62.0 in | Wt 176.8 lb

## 2022-11-05 DIAGNOSIS — K5520 Angiodysplasia of colon without hemorrhage: Secondary | ICD-10-CM | POA: Diagnosis not present

## 2022-11-05 DIAGNOSIS — D500 Iron deficiency anemia secondary to blood loss (chronic): Secondary | ICD-10-CM

## 2022-11-05 MED ORDER — ACETAMINOPHEN 325 MG PO TABS: 650.0000 mg | ORAL_TABLET | Freq: Once | ORAL | Status: DC

## 2022-11-05 MED ORDER — SODIUM CHLORIDE 0.9 % IV SOLN
510.0000 mg | Freq: Once | INTRAVENOUS | Status: AC
  Administered 2022-11-05: 510 mg via INTRAVENOUS
  Filled 2022-11-05: qty 17

## 2022-11-05 MED ORDER — DIPHENHYDRAMINE HCL 25 MG PO CAPS: 25.0000 mg | ORAL_CAPSULE | Freq: Once | ORAL | Status: DC

## 2022-11-05 NOTE — Progress Notes (Signed)
Diagnosis: Iron Deficiency Anemia  Provider:  Chilton Greathouse MD  Procedure: IV Infusion  IV Type: Peripheral, IV Location: R Antecubital  Feraheme (Ferumoxytol), Dose: 510 mg  Infusion Start Time: 1339  Infusion Stop Time: 1359  Post Infusion IV Care: Patient declined observation and Peripheral IV Discontinued  Discharge: Condition: Good, Destination: Home . AVS Declined  Performed by:  Wyvonne Lenz, RN

## 2022-11-28 ENCOUNTER — Ambulatory Visit
Admission: RE | Admit: 2022-11-28 | Discharge: 2022-11-28 | Disposition: A | Discharge status: Home / Self Care | Payer: Medicare Other | Source: Ambulatory Visit | Attending: Geriatric Medicine | Admitting: Geriatric Medicine

## 2022-11-28 ENCOUNTER — Other Ambulatory Visit: Payer: Self-pay | Admitting: Geriatric Medicine

## 2022-11-28 DIAGNOSIS — S6991XA Unspecified injury of right wrist, hand and finger(s), initial encounter: Secondary | ICD-10-CM

## 2022-11-30 ENCOUNTER — Emergency Department (HOSPITAL_COMMUNITY)
Admission: EM | Admit: 2022-11-30 | Discharge: 2022-12-01 | Disposition: A | Discharge status: Home / Self Care | Payer: Medicare Other | Attending: Emergency Medicine | Admitting: Emergency Medicine

## 2022-11-30 ENCOUNTER — Emergency Department (HOSPITAL_COMMUNITY): Payer: Medicare Other

## 2022-11-30 ENCOUNTER — Encounter (HOSPITAL_COMMUNITY): Payer: Self-pay

## 2022-11-30 DIAGNOSIS — R0789 Other chest pain: Secondary | ICD-10-CM | POA: Insufficient documentation

## 2022-11-30 DIAGNOSIS — N390 Urinary tract infection, site not specified: Secondary | ICD-10-CM

## 2022-11-30 DIAGNOSIS — E876 Hypokalemia: Secondary | ICD-10-CM | POA: Diagnosis not present

## 2022-11-30 DIAGNOSIS — I2510 Atherosclerotic heart disease of native coronary artery without angina pectoris: Secondary | ICD-10-CM | POA: Insufficient documentation

## 2022-11-30 DIAGNOSIS — W1830XA Fall on same level, unspecified, initial encounter: Secondary | ICD-10-CM | POA: Insufficient documentation

## 2022-11-30 DIAGNOSIS — Z79899 Other long term (current) drug therapy: Secondary | ICD-10-CM | POA: Insufficient documentation

## 2022-11-30 DIAGNOSIS — I6782 Cerebral ischemia: Secondary | ICD-10-CM | POA: Diagnosis not present

## 2022-11-30 DIAGNOSIS — M79641 Pain in right hand: Secondary | ICD-10-CM | POA: Diagnosis not present

## 2022-11-30 DIAGNOSIS — I10 Essential (primary) hypertension: Secondary | ICD-10-CM | POA: Diagnosis not present

## 2022-11-30 DIAGNOSIS — W19XXXA Unspecified fall, initial encounter: Secondary | ICD-10-CM

## 2022-11-30 LAB — I-STAT CHEM 8, ED
BUN: 27 mg/dL — ABNORMAL HIGH (ref 8–23)
Calcium, Ion: 1.18 mmol/L (ref 1.15–1.40)
Chloride: 102 mmol/L (ref 98–111)
Creatinine, Ser: 1.7 mg/dL — ABNORMAL HIGH (ref 0.44–1.00)
Glucose, Bld: 92 mg/dL (ref 70–99)
HCT: 34 % — ABNORMAL LOW (ref 36.0–46.0)
Hemoglobin: 11.6 g/dL — ABNORMAL LOW (ref 12.0–15.0)
Potassium: 3.1 mmol/L — ABNORMAL LOW (ref 3.5–5.1)
Sodium: 138 mmol/L (ref 135–145)
TCO2: 21 mmol/L — ABNORMAL LOW (ref 22–32)

## 2022-11-30 LAB — CBC WITH DIFFERENTIAL/PLATELET
Abs Immature Granulocytes: 0.01 10*3/uL (ref 0.00–0.07)
Basophils Absolute: 0 10*3/uL (ref 0.0–0.1)
Basophils Relative: 1 %
Eosinophils Absolute: 0.1 10*3/uL (ref 0.0–0.5)
Eosinophils Relative: 2 %
HCT: 36 % (ref 36.0–46.0)
Hemoglobin: 11.5 g/dL — ABNORMAL LOW (ref 12.0–15.0)
Immature Granulocytes: 0 %
Lymphocytes Relative: 29 %
Lymphs Abs: 1.2 10*3/uL (ref 0.7–4.0)
MCH: 30 pg (ref 26.0–34.0)
MCHC: 31.9 g/dL (ref 30.0–36.0)
MCV: 94 fL (ref 80.0–100.0)
Monocytes Absolute: 0.4 10*3/uL (ref 0.1–1.0)
Monocytes Relative: 10 %
Neutro Abs: 2.4 10*3/uL (ref 1.7–7.7)
Neutrophils Relative %: 58 %
Platelets: 147 10*3/uL — ABNORMAL LOW (ref 150–400)
RBC: 3.83 MIL/uL — ABNORMAL LOW (ref 3.87–5.11)
RDW: 19.4 % — ABNORMAL HIGH (ref 11.5–15.5)
WBC: 4.2 10*3/uL (ref 4.0–10.5)
nRBC: 0 % (ref 0.0–0.2)

## 2022-11-30 MED ORDER — SODIUM CHLORIDE 0.9 % IV BOLUS
1000.0000 mL | Freq: Once | INTRAVENOUS | Status: AC
  Administered 2022-11-30: 1000 mL via INTRAVENOUS

## 2022-11-30 NOTE — ED Triage Notes (Signed)
Pt from home BIB [facility] for mental evaluation family is concern as they feel pt is "on a verge of having a psychotic break". Reports pt is more "snappy" then normal, having thoughts of feeling "worthless" family concern for advance dementia.    Pt also c/o abd pain, CP, and pt fell Sunday hit her head, did not received medical care at this, pt on pradaxa as well. VSS per EMS

## 2022-11-30 NOTE — ED Provider Notes (Signed)
[place] EMERGENCY DEPARTMENT AT [facility] Provider Note  CSN: [id number] Arrival date & time: 11/30/22 2302  Chief Complaint(s) Family Concern   HPI [name] is a 77 y.o. female {Add pertinent medical, surgical, social history, OB history to HPI:1}   HPI  Past Medical History Past Medical History:  Diagnosis Date   Anticoagulated    Pradaxa   Bilateral leg pain    Right leg has greater pain than the left.   Coronary artery disease cardiologist-- dr Johney Frame   a.  s/p Xience DES to RCA 04/2009;   b. TEE 2/12: EF 40%, Large PFO;  c.  Lexiscan Myoview 05/2012: EF 69%, no ischemia. LHC (05/2012):  Ostial diagonal 30-40%, proximal mid ramus intermedius 40-50%, RCA stent patent with 40-50% after stent, then 40%, distal RCA 40-50%, EF 55-65%. Medical therapy continued.;  d.  Eugenie Birks Myoview (06/2013):  No ischemia, EF 83%, normal study   Full dentures    GERD (gastroesophageal reflux disease)    History of basal cell carcinoma excision    scalp   History of hiatal hernia    History of kidney stones 51 years ago   x1   History of recurrent UTIs    Hypertension    IFG (impaired fasting glucose)    MVP (mitral valve prolapse)    mild per last echo 04-03-2017 in epic   OA (osteoarthritis)    knees , fingers   PAF (paroxysmal atrial fibrillation) [facility])    cardiologist-- dr Johney Frame   S/P ablation of atrial fibrillation 08/15/2010   S/P drug eluting coronary stent placement 04/04/2009   Spinal stenosis    Tremor    Wears glasses    Patient Active Problem List   Diagnosis Date Noted   Angiodysplasia of colon 10/22/2022   Iron deficiency anemia due to chronic blood loss 10/22/2022   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   Benign positional vertigo 04/10/2021   S/P lumbar fusion 05/11/2020   Back pain 04/05/2020   Fracture of metatarsal, closed 04/05/2020   GERD (gastroesophageal reflux disease) 04/05/2020   Insomnia 04/05/2020   Lumbar post-laminectomy syndrome  02/26/2020   Degenerative lumbar spinal stenosis 12/21/2019   Degenerative spondylolisthesis 12/21/2019   RUQ abdominal pain 05/06/2019   Chronic anticoagulation 05/06/2019   Screening for viral disease 05/06/2019   Lumbar radiculopathy 05/21/2018   S/P left TKA 12/20/2015   Obese 09/08/2014   S/P right TKA 09/07/2014   History of total knee replacement, left 09/07/2014   Congestive heart failure (HCC) 07/15/2013   Diarrhea 07/15/2012   Incontinence of feces 07/15/2012   Precordial pain 10/19/2010   EDEMA 12/16/2009   Atrial fibrillation (HCC) 08/01/2009   Disease of lung 04/28/2009   Elevated fasting glucose 04/27/2009   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 Authorizing Provider  dabigatran (PRADAXA) 150 MG CAPS capsule Take 1 capsule (150 mg total) by mouth 2 (two) times daily. 04/09/22   Allred, Fayrene Fearing, MD  diphenhydrAMINE (BENADRYL) 25 mg capsule Take 25 mg by mouth every 6 (six) hours as needed.    [provider]  hydrochlorothiazide (HYDRODIURIL) 12.5 MG tablet Take 1 tablet (12.5 mg total) by mouth daily. 03/28/17  Hillis Range, MD  isosorbide mononitrate (IMDUR) 30 MG 24 hr tablet Take 30 mg by mouth daily.    [provider]  lisinopril (PRINIVIL,ZESTRIL) 10 MG tablet Take 1 tablet (10 mg total) by mouth daily. 08/27/17   Allred, Fayrene Fearing, MD  metoprolol succinate (TOPROL-XL) 25 MG 24 hr tablet Take 1 tablet (25 mg total) by mouth 2 (two) times daily. Please make overdue appt with Dr. Johney Frame before anymore refills. Thank you 1st attempt 05/18/21   Hillis Range, MD  nitroGLYCERIN (NITROSTAT) 0.4 MG SL tablet  DISSOLVE 1 TABLET UNDER THE TONGUE EVERY 5 MINUTES AS NEEDED FOR CHEST PAIN 07/06/19   Wendall Stade, MD  pantoprazole (PROTONIX) 40 MG tablet Take 1 tablet (40 mg total) by mouth 2 (two) times daily. NEEDS VISIT FOR FURTHER REFILLS 01/19/20   Pyrtle, Carie Caddy, MD  primidone (MYSOLINE) 50 MG tablet Take 2 tablets (100 mg total) by mouth at bedtime. 09/07/19   Butch Penny, NP  rosuvastatin (CRESTOR) 40 MG tablet Take 1 tablet (40 mg total) by mouth daily. Please call to schedule appointment for future refills. Final Attempt 07/25/21   Hillis Range, MD  traZODone (DESYREL) 50 MG tablet Take 150 mg by mouth at bedtime as needed. 03/14/21   [provider]                                                                                                                                    Allergies Cephalexin  Review of Systems Review of Systems As noted in HPI  Physical Exam Vital Signs  I have reviewed the triage vital signs BP 136/63 (BP Location: Left Arm)   Pulse 69   Temp 97.7 F (36.5 C) (Oral)   Resp 14   Ht 5\' 2"  (1.575 m)   Wt 77.1 kg   SpO2 97%   BMI 31.09 kg/m  *** Physical Exam  ED Results and Treatments Labs (all labs ordered are listed, but only abnormal results are displayed) Labs Reviewed - No data to display                                                                                                                       EKG  EKG Interpretation  Date/Time:    Ventricular Rate:    PR Interval:    QRS Duration:   QT Interval:    QTC Calculation:   R Axis:  Text Interpretation:         Radiology No results found.  Medications Ordered in ED Medications - No data to display Procedures Procedures  (including critical care time) Medical Decision Making / ED Course   Medical Decision Making   ***    Final Clinical Impression(s) / ED Diagnoses Final diagnoses:  None    This chart was dictated using voice recognition software.  Despite  best efforts to proofread,  errors can occur which can change the documentation meaning.

## 2022-12-01 ENCOUNTER — Emergency Department (HOSPITAL_COMMUNITY): Payer: Medicare Other

## 2022-12-01 DIAGNOSIS — R0789 Other chest pain: Secondary | ICD-10-CM | POA: Diagnosis not present

## 2022-12-01 LAB — TROPONIN I (HIGH SENSITIVITY)
Troponin I (High Sensitivity): 5 ng/L (ref <18)
Troponin I (High Sensitivity): 6 ng/L (ref <18)

## 2022-12-01 LAB — URINALYSIS, W/ REFLEX TO CULTURE (INFECTION SUSPECTED)
Bilirubin Urine: NEGATIVE
Glucose, UA: NEGATIVE mg/dL
Hgb urine dipstick: NEGATIVE
Ketones, ur: NEGATIVE mg/dL
Nitrite: NEGATIVE
Protein, ur: NEGATIVE mg/dL
Specific Gravity, Urine: 1.009 (ref 1.005–1.030)
pH: 5 (ref 5.0–8.0)

## 2022-12-01 LAB — COMPREHENSIVE METABOLIC PANEL
ALT: 23 U/L (ref 0–44)
AST: 22 U/L (ref 15–41)
Albumin: 3.9 g/dL (ref 3.5–5.0)
Alkaline Phosphatase: 40 U/L (ref 38–126)
Anion gap: 12 (ref 5–15)
BUN: 28 mg/dL — ABNORMAL HIGH (ref 8–23)
CO2: 20 mmol/L — ABNORMAL LOW (ref 22–32)
Calcium: 8.7 mg/dL — ABNORMAL LOW (ref 8.9–10.3)
Chloride: 104 mmol/L (ref 98–111)
Creatinine, Ser: 1.43 mg/dL — ABNORMAL HIGH (ref 0.44–1.00)
GFR, Estimated: 38 mL/min — ABNORMAL LOW (ref >60)
Glucose, Bld: 95 mg/dL (ref 70–99)
Potassium: 3 mmol/L — ABNORMAL LOW (ref 3.5–5.1)
Sodium: 136 mmol/L (ref 135–145)
Total Bilirubin: 0.2 mg/dL — ABNORMAL LOW (ref 0.3–1.2)
Total Protein: 5.9 g/dL — ABNORMAL LOW (ref 6.5–8.1)

## 2022-12-01 LAB — LIPASE, BLOOD: Lipase: 44 U/L (ref 11–51)

## 2022-12-01 MED ORDER — NITROFURANTOIN MONOHYD MACRO 100 MG PO CAPS
100.0000 mg | ORAL_CAPSULE | Freq: Once | ORAL | Status: AC
  Administered 2022-12-01: 100 mg via ORAL
  Filled 2022-12-01: qty 1

## 2022-12-01 MED ORDER — POTASSIUM CHLORIDE CRYS ER 20 MEQ PO TBCR
40.0000 meq | EXTENDED_RELEASE_TABLET | Freq: Once | ORAL | Status: AC
  Administered 2022-12-01: 40 meq via ORAL
  Filled 2022-12-01: qty 2

## 2022-12-01 MED ORDER — NITROFURANTOIN MONOHYD MACRO 100 MG PO CAPS: 100.0000 mg | ORAL_CAPSULE | Freq: Two times a day (BID) | ORAL | 0 refills | Status: DC

## 2022-12-01 MED ORDER — POTASSIUM CHLORIDE CRYS ER 20 MEQ PO TBCR: 20.0000 meq | EXTENDED_RELEASE_TABLET | Freq: Every day | ORAL | 0 refills | Status: DC

## 2022-12-01 NOTE — ED Notes (Signed)
Pt appears to be comfortable and resting, can observe even RR that are unlabored, pt remains on cardiac monitoring devices, no changes noted, side rails up x2 for safety, NAD noted, plan of care ongoing, call light within reach, no further concerns as of present.   

## 2022-12-06 ENCOUNTER — Telehealth: Payer: Self-pay

## 2022-12-06 NOTE — Telephone Encounter (Signed)
Transition Care Management Unsuccessful Follow-up Telephone Call  Date of discharge and from where:  [facility]  Attempts:  1st Attempt  Reason for unsuccessful TCM follow-up call:  Left voice message   Lenard Forth [facility] Guide, [facility] [phone] [address] [place], [place], [place] [address] Phone: [phone] Email: [email or web address]

## 2022-12-06 NOTE — Telephone Encounter (Signed)
Transition Care Management Unsuccessful Follow-up Telephone Call  Date of discharge and from where:  [name]  Attempts:  2nd Attempt  Reason for unsuccessful TCM follow-up call:  Left voice message   [name] [facility] Guide, [facility] [phone] [address] [place], [place], [place] [address] Phone: [phone] Email: [email or web address]

## 2023-01-16 ENCOUNTER — Encounter: Payer: Self-pay | Admitting: Adult Health

## 2023-01-16 ENCOUNTER — Telehealth: Payer: Self-pay

## 2023-01-16 ENCOUNTER — Ambulatory Visit (INDEPENDENT_AMBULATORY_CARE_PROVIDER_SITE_OTHER): Payer: Medicare Other | Admitting: Adult Health

## 2023-01-16 VITALS — BP 110/70 | HR 74 | Temp 97.5°F | Resp 16 | Ht 62.0 in | Wt 173.6 lb

## 2023-01-16 DIAGNOSIS — F01B0 Vascular dementia, moderate, without behavioral disturbance, psychotic disturbance, mood disturbance, and anxiety: Secondary | ICD-10-CM

## 2023-01-16 DIAGNOSIS — I2510 Atherosclerotic heart disease of native coronary artery without angina pectoris: Secondary | ICD-10-CM

## 2023-01-16 DIAGNOSIS — I10 Essential (primary) hypertension: Secondary | ICD-10-CM

## 2023-01-16 DIAGNOSIS — F5101 Primary insomnia: Secondary | ICD-10-CM

## 2023-01-16 DIAGNOSIS — Z7689 Persons encountering health services in other specified circumstances: Secondary | ICD-10-CM | POA: Diagnosis not present

## 2023-01-16 DIAGNOSIS — Z1231 Encounter for screening mammogram for malignant neoplasm of breast: Secondary | ICD-10-CM

## 2023-01-16 DIAGNOSIS — Z7901 Long term (current) use of anticoagulants: Secondary | ICD-10-CM

## 2023-01-16 DIAGNOSIS — I480 Paroxysmal atrial fibrillation: Secondary | ICD-10-CM | POA: Diagnosis not present

## 2023-01-16 DIAGNOSIS — K219 Gastro-esophageal reflux disease without esophagitis: Secondary | ICD-10-CM | POA: Diagnosis not present

## 2023-01-16 MED ORDER — NITROGLYCERIN 0.4 MG SL SUBL: 0.4000 mg | SUBLINGUAL_TABLET | SUBLINGUAL | 3 refills | Status: DC | PRN

## 2023-01-16 NOTE — Telephone Encounter (Signed)
Patient called to express that she does not understand some of the diagnosis on her ASV. Patient states there is an incorrect diagnosis of breast cancer and I explained the diagnosis "Encounter for screening mammogram for malignant neoplasm of breast" is the diagnosis associated with her mammogram order and does not state that she has breast cancer. Patient then stated she explained to Medina-Vargas, Monina C, NP that she had a mammogram in the fall of 2023 and does not need a mammogram at this time. Patient would like that order canceled.  Patient states she would also like to know where Medina-Vargas, Monina C, NP got the diagnosis of vascular dementia as we did not perform any test or screening to validate that diagnosis.  Please advise

## 2023-01-16 NOTE — Telephone Encounter (Signed)
Left message on voicemail for patients son, [name] to return call when available

## 2023-01-16 NOTE — Patient Instructions (Signed)
Preventive Care 65 Years and Older, Female Preventive care refers to lifestyle choices and visits with your health care provider that can promote health and wellness. Preventive care visits are also called wellness exams. What can I expect for my preventive care visit? Counseling Your health care provider may ask you questions about your: Medical history, including: Past medical problems. Family medical history. Pregnancy and menstrual history. History of falls. Current health, including: Memory and ability to understand (cognition). Emotional well-being. Home life and relationship well-being. Sexual activity and sexual health. Lifestyle, including: Alcohol, nicotine or tobacco, and drug use. Access to firearms. Diet, exercise, and sleep habits. Work and work environment. Sunscreen use. Safety issues such as seatbelt and bike helmet use. Physical exam Your health care provider will check your: Height and weight. These may be used to calculate your BMI (body mass index). BMI is a measurement that tells if you are at a healthy weight. Waist circumference. This measures the distance around your waistline. This measurement also tells if you are at a healthy weight and may help predict your risk of certain diseases, such as type 2 diabetes and high blood pressure. Heart rate and blood pressure. Body temperature. Skin for abnormal spots. What immunizations do I need?  Vaccines are usually given at various ages, according to a schedule. Your health care provider will recommend vaccines for you based on your age, medical history, and lifestyle or other factors, such as travel or where you work. What tests do I need? Screening Your health care provider may recommend screening tests for certain conditions. This may include: Lipid and cholesterol levels. Hepatitis C test. Hepatitis B test. HIV (human immunodeficiency virus) test. STI (sexually transmitted infection) testing, if you are at  risk. Lung cancer screening. Colorectal cancer screening. Diabetes screening. This is done by checking your blood sugar (glucose) after you have not eaten for a while (fasting). Mammogram. Talk with your health care provider about how often you should have regular mammograms. BRCA-related cancer screening. This may be done if you have a family history of breast, ovarian, tubal, or peritoneal cancers. Bone density scan. This is done to screen for osteoporosis. Talk with your health care provider about your test results, treatment options, and if necessary, the need for more tests. Follow these instructions at home: Eating and drinking  Eat a diet that includes fresh fruits and vegetables, whole grains, lean protein, and low-fat dairy products. Limit your intake of foods with high amounts of sugar, saturated fats, and salt. Take vitamin and mineral supplements as recommended by your health care provider. Do not drink alcohol if your health care provider tells you not to drink. If you drink alcohol: Limit how much you have to 0-1 drink a day. Know how much alcohol is in your drink. In the [place] one drink equals one 12 oz bottle of beer (355 mL), one 5 oz glass of wine (148 mL), or one 1 oz glass of hard liquor (44 mL). Lifestyle Brush your teeth every morning and night with fluoride toothpaste. Floss one time each day. Exercise for at least 30 minutes 5 or more days each week. Do not use any products that contain nicotine or tobacco. These products include cigarettes, chewing tobacco, and vaping devices, such as e-cigarettes. If you need help quitting, ask your health care provider. Do not use drugs. If you are sexually active, practice safe sex. Use a condom or other form of protection in order to prevent STIs. Take aspirin only as told by   your health care provider. Make sure that you understand how much to take and what form to take. Work with your health care provider to find out whether it  is safe and beneficial for you to take aspirin daily. Ask your health care provider if you need to take a cholesterol-lowering medicine (statin). Find healthy ways to manage stress, such as: Meditation, yoga, or listening to music. Journaling. Talking to a trusted person. Spending time with friends and family. Minimize exposure to UV radiation to reduce your risk of skin cancer. Safety Always wear your seat belt while driving or riding in a vehicle. Do not drive: If you have been drinking alcohol. Do not ride with someone who has been drinking. When you are tired or distracted. While texting. If you have been using any mind-altering substances or drugs. Wear a helmet and other protective equipment during sports activities. If you have firearms in your house, make sure you follow all gun safety procedures. What's next? Visit your health care provider once a year for an annual wellness visit. Ask your health care provider how often you should have your eyes and teeth checked. Stay up to date on all vaccines. This information is not intended to replace advice given to you by your health care provider. Make sure you discuss any questions you have with your health care provider. Document Revised: 12/14/2020 Document Reviewed: 12/14/2020 Elsevier Patient Education  2024 [employer]  

## 2023-01-16 NOTE — Progress Notes (Signed)
[facility]  Provider:  Kenard Gower DNP  Code Status:  Full Code  Goals of Care:     01/16/2023   12:59 PM  Advanced Directives  Does Patient Have a Medical Advance Directive? Yes  Type of Estate agent of [place];Living will;Out of facility DNR (pink MOST or yellow form)  Does patient want to make changes to medical advance directive? No - Patient declined  Copy of Healthcare Power of Attorney in Chart? Yes - validated most recent copy scanned in chart (See row information)     Chief Complaint  Patient presents with   Establish Care    New patient    HPI: Patient is a 77 y.o. female seen today to establish care with [facility]. She was accompanied by her son and husband. She graduated high school. She has a son and a daughter. She lives with her husband and son lives nearby. She has not smoked cigarette in 20 years.. She denies shortness of breath. She drinks a glass of wine once a week. Per son, herdiet is high in carbohydrate and low protein. She eats out with husband daily for supper. She does not like vegetables since it makes her have frequent bowel movement. She stated that her sister and mother are same,frequent diarrhea. Per son, she was diagnosed with dementia with MMSE score 18/30. She was reported to breakdown in anger. Per son, she does not want to talk about dementia. She declined mammogram.   Past Medical History:  Diagnosis Date   Anticoagulated    Pradaxa   Bilateral leg pain    Right leg has greater pain than the left.   Coronary artery disease cardiologist-- dr Johney Frame   a.  s/p Xience DES to RCA 04/2009;   b. TEE 2/12: EF 40%, Large PFO;  c.  Lexiscan Myoview 05/2012: EF 69%, no ischemia. LHC (05/2012):  Ostial diagonal 30-40%, proximal mid ramus intermedius 40-50%, RCA stent patent with 40-50% after stent, then 40%, distal RCA 40-50%, EF 55-65%. Medical therapy continued.;  d.  Eugenie Birks Myoview (06/2013):  No ischemia, EF 83%, normal  study   Full dentures    GERD (gastroesophageal reflux disease)    History of basal cell carcinoma excision    scalp   History of hiatal hernia    History of kidney stones 51 years ago   x1   History of recurrent UTIs    Hypertension    IFG (impaired fasting glucose)    MVP (mitral valve prolapse)    mild per last echo 04-03-2017 in epic   OA (osteoarthritis)    knees , fingers   PAF (paroxysmal atrial fibrillation) [facility])    cardiologist-- dr Johney Frame   S/P ablation of atrial fibrillation 08/15/2010   S/P drug eluting coronary stent placement 04/04/2009   Spinal stenosis    Tremor    Wears glasses     Past Surgical History:  Procedure Laterality Date   ABDOMINAL EXPOSURE N/A 05/11/2020   Procedure: ABDOMINAL EXPOSURE;  Surgeon: Larina Earthly, MD;  Location: [facility] OR;  Service: Vascular;  Laterality: N/A;   ANAL RECTAL MANOMETRY N/A 01/11/2014   Procedure: ANAL RECTAL MANOMETRY;  Surgeon: Romie Levee, MD;  Location: [facility]OSCOPY;  Service: Endoscopy;  Laterality: N/A;   APPENDECTOMY  1973   BACK SURGERY     laminectomy times 2; 2000 and 2001   BUNIONECTOMY Bilateral 2013   CARDIAC CATHETERIZATION  10-24-2010   dr allred   singl-vessel CAD with patent stent mRCA/  moderate disease mRCA beyond stent segment/  normal lvsf   CARDIAC ELECTROPHYSIOLOGY MAPPING AND ABLATION  08-15-2010  dr allred   CARDIOVASCULAR STRESS TEST  06-15-2013  dr allred   normal perfusion study/  no ischemia/  ef 83%   CARPAL TUNNEL RELEASE Right 25 years ago   CATARACT EXTRACTION W/ INTRAOCULAR LENS  IMPLANT, BILATERAL     CHOLECYSTECTOMY OPEN  1973   W/  APPENDECTOMY   CORONARY ANGIOPLASTY WITH STENT PLACEMENT  04-04-2009  dr Smitty Cords brodie   PCI and DES x1 to  mRCA/  mLAD 40%, pCX 30%, pRCA 30%/  normal LVF   FLEXIBLE SIGMOIDOSCOPY N/A 07/07/2013   Procedure: FLEXIBLE SIGMOIDOSCOPY;  Surgeon: Vertell Novak., MD;  Location: [facility];  Service: Endoscopy;  Laterality: N/A;  unprepped   LEFT HEART  CATH AND CORONARY ANGIOGRAPHY N/A 10/26/2019   Procedure: LEFT HEART CATH AND CORONARY ANGIOGRAPHY;  Surgeon: Swaziland, Peter M, MD;  Location: [facility] INVASIVE CV LAB;  Service: Cardiovascular;  Laterality: N/A;   LEFT HEART CATHETERIZATION WITH CORONARY ANGIOGRAM N/A 05/09/2012   Procedure: LEFT HEART CATHETERIZATION WITH CORONARY ANGIOGRAM;  Surgeon: Herby Abraham, MD;  Location: [facility] CATH LAB;  Service: Cardiovascular;  Laterality: N/A;   MOHS SURGERY  2009   scalp   PILONIDAL CYST EXCISION  2009;   2000;   1999   RECTAL ULTRASOUND N/A 01/11/2014   Procedure: RECTAL ULTRASOUND;  Surgeon: Romie Levee, MD;  Location: [facility];  Service: Endoscopy;  Laterality: N/A;   TOTAL KNEE ARTHROPLASTY Right 09/07/2014   Procedure: RIGHT TOTAL KNEE ARTHROPLASTY;  Surgeon: Durene Romans, MD;  Location: [facility] ORS;  Service: Orthopedics;  Laterality: Right;   TOTAL KNEE ARTHROPLASTY Left 12/20/2015   Procedure: LEFT TOTAL KNEE ARTHROPLASTY;  Surgeon: Durene Romans, MD;  Location: [facility] ORS;  Service: Orthopedics;  Laterality: Left;   TRANSTHORACIC ECHOCARDIOGRAM  04-03-2017   dr allred   Massie Maroon 55-60%/  mild MVP anterior leaflet (valve area 2.1cm^2) with mild regurg. , no stenosis/  trivial TR and PR   VAGINAL HYSTERECTOMY  1975   W/ BILATERAL SALPINOOPHOORECTOMY   VAGUS NERVE STIMULATOR INSERTION N/A 03/10/2014   Procedure: IMPLANTATION OF SACRAL  NERVE STIMULATOR ;  Surgeon: Romie Levee, MD;  Location: [facility];  Service: General;  Laterality: N/A;  Sacral. [employer]    Allergies  Allergen Reactions   Cephalexin Nausea Only    Outpatient Encounter Medications as of 01/16/2023  Medication Sig   dabigatran (PRADAXA) 150 MG CAPS capsule Take 1 capsule (150 mg total) by mouth 2 (two) times daily.   hydrochlorothiazide (HYDRODIURIL) 12.5 MG tablet Take 1 tablet (12.5 mg total) by mouth daily.   lisinopril (PRINIVIL,ZESTRIL) 10 MG tablet Take 1 tablet (10 mg total) by mouth daily.   metoprolol succinate  (TOPROL-XL) 25 MG 24 hr tablet Take 1 tablet (25 mg total) by mouth 2 (two) times daily. Please make overdue appt with Dr. Johney Frame before anymore refills. Thank you 1st attempt   nitroGLYCERIN (NITROSTAT) 0.4 MG SL tablet DISSOLVE 1 TABLET UNDER THE TONGUE EVERY 5 MINUTES AS NEEDED FOR CHEST PAIN   pantoprazole (PROTONIX) 40 MG tablet Take 1 tablet (40 mg total) by mouth 2 (two) times daily. NEEDS VISIT FOR FURTHER REFILLS   primidone (MYSOLINE) 50 MG tablet Take 50 mg by mouth in the morning and at bedtime.   rosuvastatin (CRESTOR) 40 MG tablet Take 1 tablet (40 mg total) by mouth daily. Please call to schedule appointment for future refills. Final  Attempt   traZODone (DESYREL) 50 MG tablet Take 150 mg by mouth at bedtime.   isosorbide mononitrate (IMDUR) 30 MG 24 hr tablet Take 30 mg by mouth daily.   [DISCONTINUED] diphenhydrAMINE (BENADRYL) 25 mg capsule Take 25 mg by mouth every 6 (six) hours as needed.   [DISCONTINUED] nitrofurantoin, macrocrystal-monohydrate, (MACROBID) 100 MG capsule Take 1 capsule (100 mg total) by mouth 2 (two) times daily.   [DISCONTINUED] potassium chloride SA (KLOR-CON M) 20 MEQ tablet Take 1 tablet (20 mEq total) by mouth daily.   [DISCONTINUED] primidone (MYSOLINE) 50 MG tablet Take 2 tablets (100 mg total) by mouth at bedtime.   [DISCONTINUED] traZODone (DESYREL) 50 MG tablet Take 150 mg by mouth at bedtime as needed.   No facility-administered encounter medications on file as of 01/16/2023.    Review of Systems:  Review of Systems  Constitutional:  Negative for appetite change, chills, fatigue and fever.  HENT:  Negative for congestion, hearing loss, rhinorrhea and sore throat.   Eyes: Negative.   Respiratory:  Negative for cough, shortness of breath and wheezing.   Cardiovascular:  Negative for chest pain, palpitations and leg swelling.  Gastrointestinal:  Negative for abdominal pain, constipation, diarrhea, nausea and vomiting.  Genitourinary:  Negative for  dysuria.  Musculoskeletal:  Negative for arthralgias, back pain and myalgias.  Skin:  Negative for color change, rash and wound.  Neurological:  Negative for dizziness, weakness and headaches.  Psychiatric/Behavioral:  Negative for behavioral problems. The patient is not nervous/anxious.     Health Maintenance  Topic Date Due   FOOT EXAM  Never done   OPHTHALMOLOGY EXAM  Never done   Diabetic kidney evaluation - Urine ACR  Never done   Hepatitis C Screening  Never done   DTaP/Tdap/Td (1 - Tdap) Never done   DEXA SCAN  Never done   HEMOGLOBIN A1C  06/14/2016   Zoster Vaccines- Shingrix (2 of 2) 06/07/2017   Medicare Annual Wellness (AWV)  04/01/2018   COVID-19 Vaccine (4 - 2023-24 season) 07/03/2023 (Originally 05/17/2022)   INFLUENZA VACCINE  01/31/2023   Diabetic kidney evaluation - eGFR measurement  11/30/2023   Pneumonia Vaccine 47+ Years old  Completed   HPV VACCINES  Aged Out   Colonoscopy  Discontinued    Physical Exam: Vitals:   01/16/23 1250  BP: 110/70  Pulse: 74  Resp: 16  Temp: (!) 97.5 F (36.4 C)  SpO2: 93%  Weight: 173 lb 9.6 oz (78.7 kg)  Height: 5\' 2"  (1.575 m)   Body mass index is 31.75 kg/m. Physical Exam Constitutional:      Appearance: Normal appearance.  HENT:     Head: Normocephalic and atraumatic.     Nose: Nose normal.     Mouth/Throat:     Mouth: Mucous membranes are moist.  Eyes:     Conjunctiva/sclera: Conjunctivae normal.  Cardiovascular:     Rate and Rhythm: Normal rate and regular rhythm.  Pulmonary:     Effort: Pulmonary effort is normal.     Breath sounds: Normal breath sounds.  Abdominal:     General: Bowel sounds are normal.     Palpations: Abdomen is soft.  Musculoskeletal:        General: Normal range of motion.     Cervical back: Normal range of motion.  Skin:    General: Skin is warm and dry.  Neurological:     Mental Status: She is alert. Mental status is at baseline.  Psychiatric:  Mood and Affect: Mood  normal.        Behavior: Behavior normal.     Labs reviewed: Basic Metabolic Panel: Recent Labs    03/15/22 1421 04/05/22 1946 11/30/22 2319 11/30/22 2327  NA 138 136 136 138  K 3.9 3.6 3.0* 3.1*  CL 103 100 104 102  CO2 27 24 20*  --   GLUCOSE 112* 146* 95 92  BUN 21 14 28* 27*  CREATININE 1.62* 1.48* 1.43* 1.70*  CALCIUM 8.6 9.1 8.7*  --    Liver Function Tests: Recent Labs    03/15/22 1421 11/30/22 2319  AST 14 22  ALT 9 23  ALKPHOS 62 40  BILITOT 0.3 0.2*  PROT 6.3 5.9*  ALBUMIN 3.8 3.9   Recent Labs    11/30/22 2319  LIPASE 44   No results for input(s): "AMMONIA" in the last 8760 hours. CBC: Recent Labs    07/11/22 1533 10/19/22 1502 11/30/22 2319 11/30/22 2327  WBC 6.9 5.8 4.2  --   NEUTROABS 4.5 3.3 2.4  --   HGB 10.3* 10.5* 11.5* 11.6*  HCT 31.8* 32.4* 36.0 34.0*  MCV 86.7 84.8 94.0  --   PLT 192.0 218.0 147*  --    Lipid Panel: No results for input(s): "CHOL", "HDL", "LDLCALC", "TRIG", "CHOLHDL", "LDLDIRECT" in the last 8760 hours. Lab Results  Component Value Date   HGBA1C 5.5 12/14/2015    Procedures since last visit: No results found.  Assessment/Plan  1. Encounter to establish care -  established care with [facility]  2. Moderate vascular dementia without behavioral disturbance, psychotic disturbance, mood disturbance, or anxiety (HCC) -  MMSE 18/30, ranging as moderate cognitive impairment -  continue supportive care  3. Paroxysmal atrial fibrillation (HCC) -  rate-controlled -  continue Pradaxa  4. Gastroesophageal reflux disease without esophagitis -  denies heartburns -  continue Protonix  5. Primary insomnia -  takes Trazodone  6. Atherosclerosis of native coronary artery of native heart without angina pectoris -  denies chest pains -  continue Imdur, Rosuvastatin and NTG PRN  7. Essential hypertension -  BP 122/88, stable    Labs/tests ordered:  None  Next appt:  Visit date not found

## 2023-01-16 NOTE — Telephone Encounter (Signed)
Spoke with [name] and he stated that it is ok to delete the mammogram as requested by his mother. [name] states that his mother is the aggressive type and she will question everything on her chart. [name] suggested that Monina slows down when interacting with his parents and not bring up dementia in front of the patient. [name] states he is aware that the dementia is a valid diagnosis for his mother. [name] suggested that we call the patient and vaguely tell her that we will review her records and leave the conversation open ended. [name] states we do not need to mention talking to him as his mother will think he is trying to undermine her.

## 2023-01-17 NOTE — Telephone Encounter (Signed)
Below is a response from Monina that was sent in a way that does not make it part of this encounter:    Medina-Vargas, Monina C, NP  You10 hours ago (10:40 PM)    Mammogram order cancelled per request.  Spoke with patient and she is aware that order was canceled as requested and we will keep all other components of her chart as is.

## 2023-01-21 ENCOUNTER — Encounter: Payer: Self-pay | Admitting: Adult Health

## 2023-01-21 ENCOUNTER — Telehealth: Payer: Self-pay | Admitting: *Deleted

## 2023-01-21 NOTE — Telephone Encounter (Signed)
Patient called upset because she does not know why we sent in Nitroglycerin tablets for her and she has Never been on them.   Stated that she thinks it is a Pharmacist, community of money. She wants to know why this was ordered if she has Never been on it.   Please Advise.

## 2023-01-24 ENCOUNTER — Other Ambulatory Visit: Payer: Self-pay

## 2023-01-24 ENCOUNTER — Telehealth: Payer: Medicare Other | Admitting: *Deleted

## 2023-01-24 MED ORDER — PANTOPRAZOLE SODIUM 40 MG PO TBEC: 40.0000 mg | DELAYED_RELEASE_TABLET | Freq: Two times a day (BID) | ORAL | 1 refills | Status: DC

## 2023-01-24 MED ORDER — TRAZODONE HCL 50 MG PO TABS: 150.0000 mg | ORAL_TABLET | Freq: Every day | ORAL | 1 refills | Status: DC

## 2023-01-24 NOTE — Addendum Note (Signed)
Addended by: Elveria Royals on: 01/24/2023 03:42 PM   Modules accepted: Orders

## 2023-01-24 NOTE — Telephone Encounter (Signed)
Pamelia Hoit called patient regarding concerns with Rx's. Patient wanted to know why Rx's were not called into [facility]. Confirmation was received that they were sent and patient was informed of the confirmation.  Patient also had concerns with her AVS documentation.   Pamelia Hoit was on Phone with patient for 37 minutes and recommended patient to schedule an appointment to discuss concerns.   Patient is bringing AVS Paperwork to the office tomorrow to show the documentation that she has. Patient stated that we are not being truthful with her.

## 2023-01-25 ENCOUNTER — Telehealth: Payer: Self-pay

## 2023-01-25 NOTE — Telephone Encounter (Signed)
Patient called stating that she called office a few days ago regarding her AVS and had not gotten a call back. I informed patient that she had a conversation with our Office Administrator Aurelio Jew and CMA Synetta Fail May on yesterday. She stated that nothing was discussed. I informed patient that if she had any questions about her AVS she could schedule an appointment with Kenard Gower, NP to discuss. She scheduled appointment for Monday July 29,2024 .   Patient then went on to ask what she is suppose to do in the meantime about her medication that was sent to the pharmacy on 01/24/23. Patient was offered to have medications sent to local pharmacy,but refused due to cost.

## 2023-01-28 ENCOUNTER — Telehealth: Payer: Medicare Other | Admitting: Adult Health

## 2023-01-31 ENCOUNTER — Ambulatory Visit: Payer: Medicare Other | Admitting: Adult Health

## 2023-02-01 ENCOUNTER — Ambulatory Visit (INDEPENDENT_AMBULATORY_CARE_PROVIDER_SITE_OTHER): Payer: Medicare Other | Admitting: Adult Health

## 2023-02-01 ENCOUNTER — Ambulatory Visit: Payer: Medicare Other | Admitting: Adult Health

## 2023-02-01 ENCOUNTER — Encounter: Payer: Self-pay | Admitting: Adult Health

## 2023-02-01 VITALS — BP 128/68 | HR 79 | Temp 98.1°F | Resp 17 | Ht 62.0 in | Wt 171.6 lb

## 2023-02-01 DIAGNOSIS — F5101 Primary insomnia: Secondary | ICD-10-CM

## 2023-02-01 DIAGNOSIS — K219 Gastro-esophageal reflux disease without esophagitis: Secondary | ICD-10-CM

## 2023-02-01 DIAGNOSIS — Z113 Encounter for screening for infections with a predominantly sexual mode of transmission: Secondary | ICD-10-CM

## 2023-02-01 DIAGNOSIS — F01B0 Vascular dementia, moderate, without behavioral disturbance, psychotic disturbance, mood disturbance, and anxiety: Secondary | ICD-10-CM | POA: Diagnosis not present

## 2023-02-01 DIAGNOSIS — I10 Essential (primary) hypertension: Secondary | ICD-10-CM

## 2023-02-01 DIAGNOSIS — I480 Paroxysmal atrial fibrillation: Secondary | ICD-10-CM

## 2023-02-01 DIAGNOSIS — Z0000 Encounter for general adult medical examination without abnormal findings: Secondary | ICD-10-CM

## 2023-02-01 LAB — CBC WITH DIFFERENTIAL/PLATELET
Absolute Monocytes: 469 cells/uL (ref 200–950)
Basophils Absolute: 53 cells/uL (ref 0–200)
Basophils Relative: 0.8 %
Eosinophils Absolute: 139 cells/uL (ref 15–500)
Eosinophils Relative: 2.1 %
HCT: 40.9 % (ref 35.0–45.0)
Hemoglobin: 14.3 g/dL (ref 11.7–15.5)
Lymphs Abs: 1716 cells/uL (ref 850–3900)
MCH: 33.9 pg — ABNORMAL HIGH (ref 27.0–33.0)
MCHC: 35 g/dL (ref 32.0–36.0)
MCV: 96.9 fL (ref 80.0–100.0)
MPV: 10.7 fL (ref 7.5–12.5)
Monocytes Relative: 7.1 %
Neutro Abs: 4224 cells/uL (ref 1500–7800)
Neutrophils Relative %: 64 %
Platelets: 214 10*3/uL (ref 140–400)
RBC: 4.22 10*6/uL (ref 3.80–5.10)
RDW: 14 % (ref 11.0–15.0)
Total Lymphocyte: 26 %
WBC: 6.6 10*3/uL (ref 3.8–10.8)

## 2023-02-01 NOTE — Patient Instructions (Signed)
Preventive Care 65 Years and Older, Female Preventive care refers to lifestyle choices and visits with your health care provider that can promote health and wellness. Preventive care visits are also called wellness exams. What can I expect for my preventive care visit? Counseling Your health care provider may ask you questions about your: Medical history, including: Past medical problems. Family medical history. Pregnancy and menstrual history. History of falls. Current health, including: Memory and ability to understand (cognition). Emotional well-being. Home life and relationship well-being. Sexual activity and sexual health. Lifestyle, including: Alcohol, nicotine or tobacco, and drug use. Access to firearms. Diet, exercise, and sleep habits. Work and work environment. Sunscreen use. Safety issues such as seatbelt and bike helmet use. Physical exam Your health care provider will check your: Height and weight. These may be used to calculate your BMI (body mass index). BMI is a measurement that tells if you are at a healthy weight. Waist circumference. This measures the distance around your waistline. This measurement also tells if you are at a healthy weight and may help predict your risk of certain diseases, such as type 2 diabetes and high blood pressure. Heart rate and blood pressure. Body temperature. Skin for abnormal spots. What immunizations do I need?  Vaccines are usually given at various ages, according to a schedule. Your health care provider will recommend vaccines for you based on your age, medical history, and lifestyle or other factors, such as travel or where you work. What tests do I need? Screening Your health care provider may recommend screening tests for certain conditions. This may include: Lipid and cholesterol levels. Hepatitis C test. Hepatitis B test. HIV (human immunodeficiency virus) test. STI (sexually transmitted infection) testing, if you are at  risk. Lung cancer screening. Colorectal cancer screening. Diabetes screening. This is done by checking your blood sugar (glucose) after you have not eaten for a while (fasting). Mammogram. Talk with your health care provider about how often you should have regular mammograms. BRCA-related cancer screening. This may be done if you have a family history of breast, ovarian, tubal, or peritoneal cancers. Bone density scan. This is done to screen for osteoporosis. Talk with your health care provider about your test results, treatment options, and if necessary, the need for more tests. Follow these instructions at home: Eating and drinking  Eat a diet that includes fresh fruits and vegetables, whole grains, lean protein, and low-fat dairy products. Limit your intake of foods with high amounts of sugar, saturated fats, and salt. Take vitamin and mineral supplements as recommended by your health care provider. Do not drink alcohol if your health care provider tells you not to drink. If you drink alcohol: Limit how much you have to 0-1 drink a day. Know how much alcohol is in your drink. In the [place] one drink equals one 12 oz bottle of beer (355 mL), one 5 oz glass of wine (148 mL), or one 1 oz glass of hard liquor (44 mL). Lifestyle Brush your teeth every morning and night with fluoride toothpaste. Floss one time each day. Exercise for at least 30 minutes 5 or more days each week. Do not use any products that contain nicotine or tobacco. These products include cigarettes, chewing tobacco, and vaping devices, such as e-cigarettes. If you need help quitting, ask your health care provider. Do not use drugs. If you are sexually active, practice safe sex. Use a condom or other form of protection in order to prevent STIs. Take aspirin only as told by   your health care provider. Make sure that you understand how much to take and what form to take. Work with your health care provider to find out whether it  is safe and beneficial for you to take aspirin daily. Ask your health care provider if you need to take a cholesterol-lowering medicine (statin). Find healthy ways to manage stress, such as: Meditation, yoga, or listening to music. Journaling. Talking to a trusted person. Spending time with friends and family. Minimize exposure to UV radiation to reduce your risk of skin cancer. Safety Always wear your seat belt while driving or riding in a vehicle. Do not drive: If you have been drinking alcohol. Do not ride with someone who has been drinking. When you are tired or distracted. While texting. If you have been using any mind-altering substances or drugs. Wear a helmet and other protective equipment during sports activities. If you have firearms in your house, make sure you follow all gun safety procedures. What's next? Visit your health care provider once a year for an annual wellness visit. Ask your health care provider how often you should have your eyes and teeth checked. Stay up to date on all vaccines. This information is not intended to replace advice given to you by your health care provider. Make sure you discuss any questions you have with your health care provider. Document Revised: 12/14/2020 Document Reviewed: 12/14/2020 Elsevier Patient Education  2024 [employer]  

## 2023-02-01 NOTE — Progress Notes (Signed)
[facility] clinic  Provider:  Kenard Gower DNP  Code Status:  Full Code  Goals of Care:     02/01/2023    1:08 PM  Advanced Directives  Does Patient Have a Medical Advance Directive? Yes  Type of Estate agent of [place];Living will;Out of facility DNR (pink MOST or yellow form)  Does patient want to make changes to medical advance directive? No - Patient declined  Copy of Healthcare Power of Attorney in Chart? Yes - validated most recent copy scanned in chart (See row information)     Chief Complaint  Patient presents with   Acute Visit    Patient is being seen discuss medication anf refills    HPI: Patient is a 77 y.o. female seen today for an acute visit for medication review. She was accompanied today by her husband.   Moderate vascular dementia without behavioral disturbance, psychotic disturbance, mood disturbance, or anxiety (HCC)  -  not on medication, husband demanded to remove diagnosis of dementia since  [employer] denying to renew her driver's license  Paroxysmal atrial fibrillation (HCC) - takes Pradaxa 150 mg daily and not BID and Metoprolol succinate 25 mg daily  Essential hypertension - BP 128/68, does not take hydrochlorothiazide 12.5 mg daily, takes Lisinopril 10 mg daily  Primary insomnia -  takes Trazodone 150 mg at bedtime  Gastroesophageal reflux disease without esophagitis -   denies heartburn, takes Protonix 40 mg at bedtime    Past Medical History:  Diagnosis Date   Anticoagulated    Pradaxa   Bilateral leg pain    Right leg has greater pain than the left.   Coronary artery disease cardiologist-- dr Johney Frame   a.  s/p Xience DES to RCA 04/2009;   b. TEE 2/12: EF 40%, Large PFO;  c.  Lexiscan Myoview 05/2012: EF 69%, no ischemia. LHC (05/2012):  Ostial diagonal 30-40%, proximal mid ramus intermedius 40-50%, RCA stent patent with 40-50% after stent, then 40%, distal RCA 40-50%, EF 55-65%. Medical therapy continued.;  d.  Eugenie Birks  Myoview (06/2013):  No ischemia, EF 83%, normal study   Full dentures    GERD (gastroesophageal reflux disease)    History of basal cell carcinoma excision    scalp   History of hiatal hernia    History of kidney stones 51 years ago   x1   History of recurrent UTIs    Hypertension    IFG (impaired fasting glucose)    MVP (mitral valve prolapse)    mild per last echo 04-03-2017 in epic   OA (osteoarthritis)    knees , fingers   PAF (paroxysmal atrial fibrillation) [facility])    cardiologist-- dr Johney Frame   S/P ablation of atrial fibrillation 08/15/2010   S/P drug eluting coronary stent placement 04/04/2009   Spinal stenosis    Tremor    Wears glasses     Past Surgical History:  Procedure Laterality Date   ABDOMINAL EXPOSURE N/A 05/11/2020   Procedure: ABDOMINAL EXPOSURE;  Surgeon: Larina Earthly, MD;  Location: [facility] OR;  Service: Vascular;  Laterality: N/A;   ANAL RECTAL MANOMETRY N/A 01/11/2014   Procedure: ANAL RECTAL MANOMETRY;  Surgeon: Romie Levee, MD;  Location: [facility] ENDOSCOPY;  Service: Endoscopy;  Laterality: N/A;   APPENDECTOMY  1973   BACK SURGERY     laminectomy times 2; 2000 and 2001   BUNIONECTOMY Bilateral 2013   CARDIAC CATHETERIZATION  10-24-2010   dr allred   singl-vessel CAD with patent stent mRCA/  moderate  disease mRCA beyond stent segment/  normal lvsf   CARDIAC ELECTROPHYSIOLOGY MAPPING AND ABLATION  08-15-2010  dr allred   CARDIOVASCULAR STRESS TEST  06-15-2013  dr allred   normal perfusion study/  no ischemia/  ef 83%   CARPAL TUNNEL RELEASE Right 25 years ago   CATARACT EXTRACTION W/ INTRAOCULAR LENS  IMPLANT, BILATERAL     CHOLECYSTECTOMY OPEN  1973   W/  APPENDECTOMY   CORONARY ANGIOPLASTY WITH STENT PLACEMENT  04-04-2009  dr Smitty Cords brodie   PCI and DES x1 to  mRCA/  mLAD 40%, pCX 30%, pRCA 30%/  normal LVF   FLEXIBLE SIGMOIDOSCOPY N/A 07/07/2013   Procedure: FLEXIBLE SIGMOIDOSCOPY;  Surgeon: Vertell Novak., MD;  Location: [facility];  Service:  Endoscopy;  Laterality: N/A;  unprepped   LEFT HEART CATH AND CORONARY ANGIOGRAPHY N/A 10/26/2019   Procedure: LEFT HEART CATH AND CORONARY ANGIOGRAPHY;  Surgeon: Swazil[place], Peter M, MD;  Location: [facility] INVASIVE CV LAB;  Service: Cardiovascular;  Laterality: N/A;   LEFT HEART CATHETERIZATION WITH CORONARY ANGIOGRAM N/A 05/09/2012   Procedure: LEFT HEART CATHETERIZATION WITH CORONARY ANGIOGRAM;  Surgeon: Herby Abraham, MD;  Location: [facility] CATH LAB;  Service: Cardiovascular;  Laterality: N/A;   MOHS SURGERY  2009   scalp   PILONIDAL CYST EXCISION  2009;   2000;   1999   RECTAL ULTRASOUND N/A 01/11/2014   Procedure: RECTAL ULTRASOUND;  Surgeon: Romie Levee, MD;  Location: [facility];  Service: Endoscopy;  Laterality: N/A;   TOTAL KNEE ARTHROPLASTY Right 09/07/2014   Procedure: RIGHT TOTAL KNEE ARTHROPLASTY;  Surgeon: Durene Romans, MD;  Location: [facility] ORS;  Service: Orthopedics;  Laterality: Right;   TOTAL KNEE ARTHROPLASTY Left 12/20/2015   Procedure: LEFT TOTAL KNEE ARTHROPLASTY;  Surgeon: Durene Romans, MD;  Location: [facility] ORS;  Service: Orthopedics;  Laterality: Left;   TRANSTHORACIC ECHOCARDIOGRAM  04-03-2017   dr allred   Massie Maroon 55-60%/  mild MVP anterior leaflet (valve area 2.1cm^2) with mild regurg. , no stenosis/  trivial TR and PR   VAGINAL HYSTERECTOMY  1975   W/ BILATERAL SALPINOOPHOORECTOMY   VAGUS NERVE STIMULATOR INSERTION N/A 03/10/2014   Procedure: IMPLANTATION OF SACRAL  NERVE STIMULATOR ;  Surgeon: Romie Levee, MD;  Location: [facility];  Service: General;  Laterality: N/A;  Sacral. [employer]    Allergies  Allergen Reactions   Cephalexin Nausea Only    Outpatient Encounter Medications as of 02/01/2023  Medication Sig   dabigatran (PRADAXA) 150 MG CAPS capsule Take 1 capsule (150 mg total) by mouth 2 (two) times daily.   lisinopril (PRINIVIL,ZESTRIL) 10 MG tablet Take 1 tablet (10 mg total) by mouth daily.   metoprolol succinate (TOPROL-XL) 25 MG 24 hr tablet Take 1 tablet  (25 mg total) by mouth 2 (two) times daily. Please make overdue appt with Dr. Johney Frame before anymore refills. Thank you 1st attempt   pantoprazole (PROTONIX) 40 MG tablet Take 1 tablet (40 mg total) by mouth 2 (two) times daily. NEEDS VISIT FOR FURTHER REFILLS   primidone (MYSOLINE) 50 MG tablet Take 50 mg by mouth in the morning and at bedtime.   traZODone (DESYREL) 50 MG tablet Take 3 tablets (150 mg total) by mouth at bedtime.   hydrochlorothiazide (HYDRODIURIL) 12.5 MG tablet Take 1 tablet (12.5 mg total) by mouth daily. (Patient not taking: Reported on 02/01/2023)   isosorbide mononitrate (IMDUR) 30 MG 24 hr tablet Take 30 mg by mouth daily.   nitroGLYCERIN (NITROSTAT) 0.4 MG SL tablet Place  1 tablet (0.4 mg total) under the tongue every 5 (five) minutes as needed for chest pain. (Patient not taking: Reported on 02/01/2023)   [DISCONTINUED] rosuvastatin (CRESTOR) 40 MG tablet Take 1 tablet (40 mg total) by mouth daily. Please call to schedule appointment for future refills. Final Attempt   No facility-administered encounter medications on file as of 02/01/2023.    Review of Systems:  Review of Systems  Constitutional:  Negative for appetite change, chills, fatigue and fever.  HENT:  Negative for congestion, hearing loss, rhinorrhea and sore throat.   Eyes: Negative.   Respiratory:  Negative for cough, shortness of breath and wheezing.   Cardiovascular:  Negative for chest pain, palpitations and leg swelling.  Gastrointestinal:  Negative for abdominal pain, constipation, diarrhea, nausea and vomiting.  Genitourinary:  Negative for dysuria.  Musculoskeletal:  Negative for arthralgias, back pain and myalgias.  Skin:  Negative for color change, rash and wound.  Neurological:  Negative for dizziness, weakness and headaches.  Psychiatric/Behavioral:  Negative for behavioral problems. The patient is not nervous/anxious.     Health Maintenance  Topic Date Due   FOOT EXAM  Never done    OPHTHALMOLOGY EXAM  Never done   Diabetic kidney evaluation - Urine ACR  Never done   Hepatitis C Screening  Never done   DTaP/Tdap/Td (1 - Tdap) Never done   DEXA SCAN  Never done   Zoster Vaccines- Shingrix (2 of 2) 06/07/2017   Medicare Annual Wellness (AWV)  04/01/2018   INFLUENZA VACCINE  01/31/2023   COVID-19 Vaccine (4 - 2023-24 season) 07/03/2023 (Originally 05/17/2022)   HEMOGLOBIN A1C  02/15/2023   Diabetic kidney evaluation - eGFR measurement  11/30/2023   Pneumonia Vaccine 62+ Years old  Completed   HPV VACCINES  Aged Out   Colonoscopy  Discontinued    Physical Exam: Vitals:   02/01/23 1304  BP: 128/68  Pulse: 79  Resp: 17  Temp: 98.1 F (36.7 C)  TempSrc: Temporal  SpO2: 94%  Weight: 171 lb 9.6 oz (77.8 kg)  Height: 5\' 2"  (1.575 m)   Body mass index is 31.39 kg/m. Physical Exam Constitutional:      General: She is not in acute distress.    Appearance: She is obese.  HENT:     Head: Normocephalic and atraumatic.     Nose: Nose normal.     Mouth/Throat:     Mouth: Mucous membranes are moist.  Eyes:     Conjunctiva/sclera: Conjunctivae normal.  Cardiovascular:     Rate and Rhythm: Normal rate and regular rhythm.  Pulmonary:     Effort: Pulmonary effort is normal.     Breath sounds: Normal breath sounds.  Abdominal:     General: Bowel sounds are normal.     Palpations: Abdomen is soft.  Musculoskeletal:        General: Normal range of motion.     Cervical back: Normal range of motion.  Skin:    General: Skin is warm and dry.  Neurological:     Mental Status: She is alert.  Psychiatric:        Mood and Affect: Mood normal.        Behavior: Behavior normal.     Labs reviewed: Basic Metabolic Panel: Recent Labs    04/05/22 1946 08/17/22 0000 11/30/22 2319 11/30/22 2327  NA 136 141 136 138  K 3.6 3.9 3.0* 3.1*  CL 100 101 104 102  CO2 24 21 20*  --   GLUCOSE 146*  --  95 92  BUN 14 27* 28* 27*  CREATININE 1.48* 1.6* 1.43* 1.70*   CALCIUM 9.1 8.9 8.7*  --   TSH  --  1.65  --   --    Liver Function Tests: Recent Labs    03/15/22 1421 08/17/22 0000 11/30/22 2319  AST 14 27 22   ALT 9 24 23   ALKPHOS 62 91 40  BILITOT 0.3  --  0.2*  PROT 6.3  --  5.9*  ALBUMIN 3.8 4.1 3.9   Recent Labs    11/30/22 2319  LIPASE 44   No results for input(s): "AMMONIA" in the last 8760 hours. CBC: Recent Labs    07/11/22 1533 08/17/22 0000 10/19/22 1502 11/30/22 2319 11/30/22 2327  WBC 6.9 4.7 5.8 4.2  --   NEUTROABS 4.5 3.00 3.3 2.4  --   HGB 10.3* 9.9* 10.5* 11.5* 11.6*  HCT 31.8* 30* 32.4* 36.0 34.0*  MCV 86.7  --  84.8 94.0  --   PLT 192.0 201 218.0 147*  --    Lipid Panel: Recent Labs    08/17/22 0000  CHOL 172  HDL 36  LDLCALC 100  TRIG 161*   Lab Results  Component Value Date   HGBA1C 6.1 08/17/2022    Procedures since last visit: No results found.  Assessment/Plan  1. Moderate vascular dementia without behavioral disturbance, psychotic disturbance, mood disturbance, or anxiety (HCC) -  not on medication  2. Paroxysmal atrial fibrillation (HCC) -  rate-controlled - dabigatran (PRADAXA) 150 MG CAPS capsule; Take 150 mg by mouth daily. - metoprolol succinate (TOPROL-XL) 25 MG 24 hr tablet; Take 25 mg by mouth daily. - CBC with Differential/Platelets - Complete Metabolic Panel with eGFR - Lipid panel  3. Essential hypertension -  BP 128/68, stable -  continue Lisinopril -  monitor BP, log and bring to next appointment - Complete Metabolic Panel with eGFR - Lipid panel  4. Primary insomnia -  continue Trazodone 150 mg at bedtime  5. Gastroesophageal reflux disease without esophagitis -  stable - pantoprazole (PROTONIX) 40 MG tablet; Take 40 mg by mouth at bedtime.  6. Screen for STD (sexually transmitted disease) - Hep C Antibody     Labs/tests ordered:  hep c antibody, CMP, CBC, lipid panel   Next appt:  04/18/2023

## 2023-02-05 ENCOUNTER — Telehealth: Payer: Self-pay

## 2023-02-05 NOTE — Telephone Encounter (Signed)
Patient called requesting an interpretation of her lab results

## 2023-02-05 NOTE — Progress Notes (Signed)
-    hep C antibody negative -  K 3.4, slightly lo (normal 3.5 - 5.3), pls eat bananas -  hgb 14.3, not anemic

## 2023-02-05 NOTE — Telephone Encounter (Signed)
Refer to labs drawn on 02/01/23

## 2023-02-07 ENCOUNTER — Ambulatory Visit: Payer: Medicare Other | Admitting: Adult Health

## 2023-02-12 ENCOUNTER — Telehealth: Payer: Medicare Other | Admitting: Nurse Practitioner

## 2023-02-12 NOTE — Telephone Encounter (Signed)
Pt called while I was on call last night wondering why the office was calling her at 5:30/6:30 pm.  I informed patient the office was closed but someone would return her call during business hours.

## 2023-02-12 NOTE — Telephone Encounter (Signed)
Refer to recent lab results in patient and her spouses chart

## 2023-02-14 ENCOUNTER — Ambulatory Visit: Payer: Medicare Other | Admitting: Adult Health

## 2023-02-14 ENCOUNTER — Telehealth: Payer: Self-pay

## 2023-02-14 ENCOUNTER — Other Ambulatory Visit: Payer: Self-pay | Admitting: Orthopedic Surgery

## 2023-02-14 NOTE — Telephone Encounter (Signed)
Please call pharmacy to clarify last refill of these medications AND dosage, strength AND who prescriber was. [name] saw patient 08/02 and patient stated they were NOT taking hydrochlorothiazide or primidone. She is also patient to other specialists and just need to clarify.

## 2023-02-14 NOTE — Telephone Encounter (Signed)
I called and spoke with pharmacy.   Hydrochlorothiazide is 12.5mg  tablet take one tablet daily. Prescribed by Dr. Marylene Land and it was received by patient  12/27/22  Primidone is 50mg  one tablet TID  received by patient 01/11/23 and prescriber was Dr. Marylene Land.(Previous PCP)  Prescriptions are through [facility]. 90 day supplies.

## 2023-02-14 NOTE — Telephone Encounter (Signed)
Patient called stating she needs a refill on her hydrochlorothiazide and Primidone. I informed patient those medication are not on her medication list and were removed at her last visit on 02/01/23. Patient states that was done in error and needs to be corrected.  Please advise

## 2023-02-15 ENCOUNTER — Other Ambulatory Visit: Payer: Self-pay | Admitting: Orthopedic Surgery

## 2023-02-15 DIAGNOSIS — R251 Tremor, unspecified: Secondary | ICD-10-CM

## 2023-02-15 DIAGNOSIS — I10 Essential (primary) hypertension: Secondary | ICD-10-CM

## 2023-02-15 DIAGNOSIS — R569 Unspecified convulsions: Secondary | ICD-10-CM

## 2023-02-15 MED ORDER — HYDROCHLOROTHIAZIDE 12.5 MG PO CAPS
12.5000 mg | ORAL_CAPSULE | Freq: Every day | ORAL | 0 refills | Status: DC
Start: 2023-02-15 — End: 2023-04-29

## 2023-02-15 MED ORDER — PRIMIDONE 50 MG PO TABS
50.0000 mg | ORAL_TABLET | Freq: Two times a day (BID) | ORAL | 0 refills | Status: DC
Start: 2023-02-15 — End: 2023-03-14

## 2023-02-15 NOTE — Telephone Encounter (Signed)
I called patient to inform her that medications had been sent to the pharmacy. No answer, so I left message stating that.

## 2023-02-15 NOTE — Telephone Encounter (Signed)
Per chart review, primidone 50 mg po BID was prescribed/charted 08/02 last encounter. Hydrochlorothiazide and primidone refilled. If she has any other concerns she will have to discuss with her PCP.

## 2023-02-28 ENCOUNTER — Telehealth: Payer: Self-pay | Admitting: Internal Medicine

## 2023-02-28 NOTE — Telephone Encounter (Signed)
Inbound call from patient stating she received a letter regarding coming in for lab work. Patient is unsure why she is needing lab work. Requesting a call back to discuss. Please advise, thank you.

## 2023-03-01 ENCOUNTER — Telehealth: Payer: Self-pay

## 2023-03-01 ENCOUNTER — Other Ambulatory Visit: Payer: Medicare Other

## 2023-03-01 ENCOUNTER — Ambulatory Visit (INDEPENDENT_AMBULATORY_CARE_PROVIDER_SITE_OTHER): Payer: Medicare Other

## 2023-03-01 ENCOUNTER — Other Ambulatory Visit: Payer: Self-pay

## 2023-03-01 DIAGNOSIS — Z23 Encounter for immunization: Secondary | ICD-10-CM | POA: Diagnosis not present

## 2023-03-01 DIAGNOSIS — D500 Iron deficiency anemia secondary to blood loss (chronic): Secondary | ICD-10-CM

## 2023-03-01 NOTE — Telephone Encounter (Signed)
Pt came in the office to the 3rd floor and told reception she was here for her lab work and going to the lab. Orders were entered. Pt got down to the lab and questioned why she was having her Iron drawn because she did not get IV Iron, states her husband got it. Discussed with her over the phone that it was up to her to have or not have the blood drawn. Pt was adamant that she had not had IV Iron , it was her husband. Per her chart she received Feraheme  at the [facility] on 10/26/22 and 11/05/22. Pt continued to state she had not received Iron and we needed to stop sending her mychart messages and letters to come in for labs, she has her labs drawn by senior care. Explained to patient that it was her choice and I was ending the call.

## 2023-03-01 NOTE — Telephone Encounter (Signed)
Patient was seen today for Influenza vaccine. I have talked to her during the visit with husband. I answered all her questions.

## 2023-03-01 NOTE — Telephone Encounter (Signed)
Patient called wanting to know why the diagnosis of dementia has been put in her chart. She states that she went to [facility] to have blood work done and was told there that they could not speak with her because "her doctor put in her chart that she has advanced dementia".

## 2023-03-01 NOTE — Telephone Encounter (Signed)
Spoke with pt and let her know she is due for CBC and Iron studies. Discussed with her that this was a 3 mth follow up from her iron infusion due to her Iron def anemia. Pt states she did not know that she had IDA. Pt wanted to know If insurance would pay the the lab, discussed with her it should. Pt states [facility] has drawn labs, let her know it does not look like they drew any iron studies. Pt states she is going to call the insurance company to see if its covered and she does not really think she needs the labs. Discussed with her it was fine if she wanted to call the insurance company and it is up to her if she comes for the labs or not. Dr. Rhea Belton notified.

## 2023-03-05 NOTE — Telephone Encounter (Signed)
Noted  

## 2023-03-13 ENCOUNTER — Other Ambulatory Visit: Payer: Self-pay | Admitting: Orthopedic Surgery

## 2023-03-13 DIAGNOSIS — R251 Tremor, unspecified: Secondary | ICD-10-CM

## 2023-03-18 ENCOUNTER — Telehealth: Payer: Medicare Other | Admitting: *Deleted

## 2023-03-18 NOTE — Telephone Encounter (Signed)
Lynden Ang, daughter called from [place] and left message on Clinical intake wanting to know if patient is still a patient of ours and if a medication she was needing was called in.   Daughter, Lynden Ang is not listed for [place] to give information to, so I called the Son, [name] and spoke with him and addressed the questions with him and he stated that he will address them with his sister. He stated that he is not aware of any medications that they Kayonna [place] need and stated that they are currently in [place]. Stated he will call if something is needed. Agreed.

## 2023-03-26 NOTE — Addendum Note (Signed)
Addended by: Michaell Cowing on: 03/26/2023 09:25 AM   Modules accepted: Orders

## 2023-03-29 ENCOUNTER — Other Ambulatory Visit: Payer: Self-pay | Admitting: *Deleted

## 2023-03-29 DIAGNOSIS — I480 Paroxysmal atrial fibrillation: Secondary | ICD-10-CM

## 2023-03-29 MED ORDER — DABIGATRAN ETEXILATE MESYLATE 150 MG PO CAPS
150.0000 mg | ORAL_CAPSULE | Freq: Every day | ORAL | 1 refills | Status: DC
Start: 2023-03-29 — End: 2023-04-01

## 2023-03-29 NOTE — Telephone Encounter (Signed)
Pradaxa 150mg  refill request received. Pt is 77 years old, weight-77.8kg, Crea-1.38 on 02/01/23, last seen by Dr. Lalla Brothers on 04/25/22, Diagnosis-Afib, CrCl-40.20ml/min; Dose is appropriate based on dosing criteria. Will send in refill to requested pharmacy.

## 2023-04-01 ENCOUNTER — Telehealth: Payer: Self-pay | Admitting: *Deleted

## 2023-04-01 ENCOUNTER — Other Ambulatory Visit: Payer: Self-pay | Admitting: *Deleted

## 2023-04-01 DIAGNOSIS — I480 Paroxysmal atrial fibrillation: Secondary | ICD-10-CM

## 2023-04-01 MED ORDER — DABIGATRAN ETEXILATE MESYLATE 150 MG PO CAPS: 150.0000 mg | ORAL_CAPSULE | Freq: Two times a day (BID) | ORAL | 1 refills | Status: DC

## 2023-04-01 NOTE — Telephone Encounter (Signed)
Pradaxa 150mg  refill request received. Pt is 77 years old, weight-77.8kg, Crea-1.38 on 02/01/23, last seen by Dr. Lalla Brothers on 04/25/22, Diagnosis-Afib, CrCl-40.20ml/min; Dose is appropriate based on dosing criteria. Will send in refill to requested pharmacy.

## 2023-04-01 NOTE — Telephone Encounter (Signed)
[name], son, called and stated that he will be bringing patient into the office tomorrow for her appointment. Stated that the patient thinks she is coming in for Medication.  Stated that he just wanted to let the provider know that patient is being seen to evaluate Mental Wellbeing. Stated that the subject has to be handled very tactfully.   Stated that the patient is the Forgetful Aggressive Type with Anger Issues.  Son stated that patient is VERY obsessed with Social Media.   Son is just calling to give a heads up.   FYI

## 2023-04-02 ENCOUNTER — Telehealth: Payer: Self-pay

## 2023-04-02 ENCOUNTER — Ambulatory Visit (INDEPENDENT_AMBULATORY_CARE_PROVIDER_SITE_OTHER): Payer: Medicare Other | Admitting: Sports Medicine

## 2023-04-02 ENCOUNTER — Encounter: Payer: Self-pay | Admitting: Sports Medicine

## 2023-04-02 VITALS — BP 122/64 | HR 112 | Temp 96.2°F | Resp 16 | Ht 62.0 in | Wt 169.0 lb

## 2023-04-02 DIAGNOSIS — R413 Other amnesia: Secondary | ICD-10-CM

## 2023-04-02 DIAGNOSIS — F411 Generalized anxiety disorder: Secondary | ICD-10-CM

## 2023-04-02 DIAGNOSIS — I10 Essential (primary) hypertension: Secondary | ICD-10-CM

## 2023-04-02 MED ORDER — VENLAFAXINE HCL ER 75 MG PO CP24: 75.0000 mg | ORAL_CAPSULE | Freq: Every day | ORAL | 3 refills | Status: DC

## 2023-04-02 NOTE — Patient Instructions (Signed)
Take  effexor 75 mg once daily  No change with rest of the medications

## 2023-04-02 NOTE — Progress Notes (Signed)
Careteam: Patient Care Team: Renee Santa, NP as PCP - General (Internal Medicine) Renee Prude, MD as PCP - Electrophysiology (Cardiology) Renee Lick, MD as Consulting Physician (Orthopedic Surgery)  PLACE OF SERVICE:  [facility]  Advanced Directive information Does Patient Have a Medical Advance Directive?: Yes, Type of Advance Directive: Healthcare Power of [place];Living will;Out of facility DNR (pink MOST or yellow form), Does patient want to make changes to medical advance directive?: No - Patient declined  Allergies  Allergen Reactions   Cephalexin Nausea Only    Chief Complaint  Patient presents with   Acute Visit    Patient son wants to discuss mental status and patient aggression.      HPI: Patient is a 77 y.o. female is here for follow up  Accompanied by her husband and son  Son informs that pt is very aggressive and forgetful. He is POA.  He informs that pt was discharged from previous practices as she was very agressive and rude to their staff. Pt has memory problems since last 1.5 yr, she is able to do most of her ADLS.  MMSE 18/30 12/2022  Son states that she calls him multiple times  She was prescribed Depakote in the past but he thinks she wasn't taking the pills. He is primarily involved in their care but has his own health concerns and thought of placing them in assisted living but do cannot afford assisted living. Pt states she is upset with lot of things , her son causes her lot of stress Denies being depressed or having suicidal ideation Sleeping fine She is on trazodone 150mg    Review of Systems:  Review of Systems  Constitutional:  Negative for chills and fever.  HENT:  Negative for congestion and sore throat.   Eyes:  Negative for double vision.  Respiratory:  Negative for cough, sputum production and shortness of breath.   Cardiovascular:  Negative for chest pain, palpitations and leg swelling.  Gastrointestinal:   Negative for abdominal pain, heartburn and nausea.  Genitourinary:  Negative for dysuria, frequency and hematuria.  Musculoskeletal:  Negative for falls and myalgias.  Neurological:  Negative for dizziness, sensory change and focal weakness.  Psychiatric/Behavioral:  Positive for memory loss. The patient is nervous/anxious and has insomnia.     Past Medical History:  Diagnosis Date   Anticoagulated    Pradaxa   Bilateral leg pain    Right leg has greater pain than the left.   Coronary artery disease cardiologist-- dr Johney Frame   a.  s/p Xience DES to RCA 04/2009;   b. TEE 2/12: EF 40%, Large PFO;  c.  Lexiscan Myoview 05/2012: EF 69%, no ischemia. LHC (05/2012):  Ostial diagonal 30-40%, proximal mid ramus intermedius 40-50%, RCA stent patent with 40-50% after stent, then 40%, distal RCA 40-50%, EF 55-65%. Medical therapy continued.;  d.  Eugenie Birks Myoview (06/2013):  No ischemia, EF 83%, normal study   Full dentures    GERD (gastroesophageal reflux disease)    History of basal cell carcinoma excision    scalp   History of hiatal hernia    History of kidney stones 51 years ago   x1   History of recurrent UTIs    Hypertension    IFG (impaired fasting glucose)    MVP (mitral valve prolapse)    mild per last echo 04-03-2017 in epic   OA (osteoarthritis)    knees , fingers   PAF (paroxysmal atrial fibrillation) [facility])    cardiologist--  dr Johney Frame   S/P ablation of atrial fibrillation 08/15/2010   S/P drug eluting coronary stent placement 04/04/2009   Spinal stenosis    Tremor    Wears glasses    Past Surgical History:  Procedure Laterality Date   ABDOMINAL EXPOSURE N/A 05/11/2020   Procedure: ABDOMINAL EXPOSURE;  Surgeon: Larina Earthly, MD;  Location: [facility] OR;  Service: Vascular;  Laterality: N/A;   ANAL RECTAL MANOMETRY N/A 01/11/2014   Procedure: ANAL RECTAL MANOMETRY;  Surgeon: Romie Levee, MD;  Location: [facility] ENDOSCOPY;  Service: Endoscopy;  Laterality: N/A;   APPENDECTOMY  1973    BACK SURGERY     laminectomy times 2; 2000 and 2001   BUNIONECTOMY Bilateral 2013   CARDIAC CATHETERIZATION  10-24-2010   dr allred   singl-vessel CAD with patent stent mRCA/  moderate disease mRCA beyond stent segment/  normal lvsf   CARDIAC ELECTROPHYSIOLOGY MAPPING AND ABLATION  08-15-2010  dr allred   CARDIOVASCULAR STRESS TEST  06-15-2013  dr allred   normal perfusion study/  no ischemia/  ef 83%   CARPAL TUNNEL RELEASE Right 25 years ago   CATARACT EXTRACTION W/ INTRAOCULAR LENS  IMPLANT, BILATERAL     CHOLECYSTECTOMY OPEN  1973   W/  APPENDECTOMY   CORONARY ANGIOPLASTY WITH STENT PLACEMENT  04-04-2009  dr Smitty Cords brodie   PCI and DES x1 to  mRCA/  mLAD 40%, pCX 30%, pRCA 30%/  normal LVF   FLEXIBLE SIGMOIDOSCOPY N/A 07/07/2013   Procedure: FLEXIBLE SIGMOIDOSCOPY;  Surgeon: Vertell Novak., MD;  Location: [facility];  Service: Endoscopy;  Laterality: N/A;  unprepped   LEFT HEART CATH AND CORONARY ANGIOGRAPHY N/A 10/26/2019   Procedure: LEFT HEART CATH AND CORONARY ANGIOGRAPHY;  Surgeon: Swaziland, Peter M, MD;  Location: [facility] INVASIVE CV LAB;  Service: Cardiovascular;  Laterality: N/A;   LEFT HEART CATHETERIZATION WITH CORONARY ANGIOGRAM N/A 05/09/2012   Procedure: LEFT HEART CATHETERIZATION WITH CORONARY ANGIOGRAM;  Surgeon: Herby Abraham, MD;  Location: [facility] CATH LAB;  Service: Cardiovascular;  Laterality: N/A;   MOHS SURGERY  2009   scalp   PILONIDAL CYST EXCISION  2009;   2000;   1999   RECTAL ULTRASOUND N/A 01/11/2014   Procedure: RECTAL ULTRASOUND;  Surgeon: Romie Levee, MD;  Location: [facility];  Service: Endoscopy;  Laterality: N/A;   TOTAL KNEE ARTHROPLASTY Right 09/07/2014   Procedure: RIGHT TOTAL KNEE ARTHROPLASTY;  Surgeon: Durene Romans, MD;  Location: [facility] ORS;  Service: Orthopedics;  Laterality: Right;   TOTAL KNEE ARTHROPLASTY Left 12/20/2015   Procedure: LEFT TOTAL KNEE ARTHROPLASTY;  Surgeon: Durene Romans, MD;  Location: [facility] ORS;  Service: Orthopedics;  Laterality: Left;    TRANSTHORACIC ECHOCARDIOGRAM  04-03-2017   dr allred   Massie Maroon 55-60%/  mild MVP anterior leaflet (valve area 2.1cm^2) with mild regurg. , no stenosis/  trivial TR and PR   VAGINAL HYSTERECTOMY  1975   W/ BILATERAL SALPINOOPHOORECTOMY   VAGUS NERVE STIMULATOR INSERTION N/A 03/10/2014   Procedure: IMPLANTATION OF SACRAL  NERVE STIMULATOR ;  Surgeon: Romie Levee, MD;  Location: [facility];  Service: General;  Laterality: N/A;  Sacral. [employer]   Social History:   reports that she quit smoking about 34 years ago. Her smoking use included cigarettes. She started smoking about 54 years ago. She has a 10 pack-year smoking history. She has never used smokeless tobacco. She reports that she does not drink alcohol and does not use drugs.  Family History  Problem Relation Age of  Onset   Heart disease Mother    Coronary artery disease Father    Heart disease Sister    Heart disease Brother    Colon cancer Neg Hx    Esophageal cancer Neg Hx    Rectal cancer Neg Hx    Stomach cancer Neg Hx     Medications: Patient's Medications  New Prescriptions   No medications on file  Previous Medications   DABIGATRAN (PRADAXA) 150 MG CAPS CAPSULE    Take 1 capsule (150 mg total) by mouth 2 (two) times daily.   HYDROCHLOROTHIAZIDE (MICROZIDE) 12.5 MG CAPSULE    Take 1 capsule (12.5 mg total) by mouth daily.   LISINOPRIL (PRINIVIL,ZESTRIL) 10 MG TABLET    Take 1 tablet (10 mg total) by mouth daily.   METOPROLOL SUCCINATE (TOPROL-XL) 25 MG 24 HR TABLET    Take 25 mg by mouth daily.   PANTOPRAZOLE (PROTONIX) 40 MG TABLET    Take 40 mg by mouth at bedtime.   PRIMIDONE (MYSOLINE) 50 MG TABLET    TAKE 1 TABLET TWICE A DAY   TRAZODONE (DESYREL) 50 MG TABLET    Take 3 tablets (150 mg total) by mouth at bedtime.  Modified Medications   No medications on file  Discontinued Medications   No medications on file    Physical Exam:  Vitals:   04/02/23 0844  BP: 122/64  Pulse: (!) 112  Resp: 16   Temp: (!) 96.2 F (35.7 C)  SpO2: 95%  Weight: 169 lb (76.7 kg)  Height: 5\' 2"  (1.575 m)   Body mass index is 30.91 kg/m. Wt Readings from Last 3 Encounters:  04/02/23 169 lb (76.7 kg)  02/01/23 171 lb 9.6 oz (77.8 kg)  01/16/23 173 lb 9.6 oz (78.7 kg)    Physical Exam Constitutional:      Appearance: Normal appearance.  HENT:     Head: Normocephalic and atraumatic.  Cardiovascular:     Rate and Rhythm: Normal rate and regular rhythm.     Heart sounds: No murmur heard. Pulmonary:     Effort: Pulmonary effort is normal. No respiratory distress.     Breath sounds: Normal breath sounds. No wheezing.  Abdominal:     General: Bowel sounds are normal. There is no distension.     Tenderness: There is no abdominal tenderness. There is no guarding or rebound.  Musculoskeletal:        General: No swelling or tenderness.  Skin:    General: Skin is dry.  Neurological:     Mental Status: She is alert. Mental status is at baseline.     Sensory: No sensory deficit.     Motor: No weakness.     Labs reviewed: Basic Metabolic Panel: Recent Labs    08/17/22 0000 11/30/22 2319 11/30/22 2327 02/01/23 1546  NA 141 136 138 139  K 3.9 3.0* 3.1* 3.4*  CL 101 104 102 100  CO2 21 20*  --  29  GLUCOSE  --  95 92 120  BUN 27* 28* 27* 18  CREATININE 1.6* 1.43* 1.70* 1.38*  CALCIUM 8.9 8.7*  --  9.8  TSH 1.65  --   --   --    Liver Function Tests: Recent Labs    08/17/22 0000 11/30/22 2319 02/01/23 1546  AST 27 22 15   ALT 24 23 10   ALKPHOS 91 40  --   BILITOT  --  0.2* 0.4  PROT  --  5.9* 6.8  ALBUMIN 4.1 3.9  --  Recent Labs    11/30/22 2319  LIPASE 44   No results for input(s): "AMMONIA" in the last 8760 hours. CBC: Recent Labs    10/19/22 1502 11/30/22 2319 11/30/22 2327 02/01/23 1546  WBC 5.8 4.2  --  6.6  NEUTROABS 3.3 2.4  --  4,224  HGB 10.5* 11.5* 11.6* 14.3  HCT 32.4* 36.0 34.0* 40.9  MCV 84.8 94.0  --  96.9  PLT 218.0 147*  --  214   Lipid  Panel: Recent Labs    08/17/22 0000  CHOL 172  HDL 36  LDLCALC 100  TRIG 782*   TSH: Recent Labs    08/17/22 0000  TSH 1.65   A1C: Lab Results  Component Value Date   HGBA1C 6.1 08/17/2022     Assessment/Plan   1. Memory deficits Son is the POA , informs that pt is forgetful and being very aggressive at home    2. GAD (generalized anxiety disorder) Will start effexor  Keep appt scheduled with your PCP in 2 weeks  3. Primary hypertension At goal  Cont with the same  Other orders - venlafaxine XR (EFFEXOR XR) 75 MG 24 hr capsule; Take 1 capsule (75 mg total) by mouth daily with breakfast.  Dispense: 90 capsule; Refill: 3  No follow-ups on file.:  2 weeks  I spent greater than 40 minutes for the care of this patient in face to face time, chart review, clinical documentation, patient education.

## 2023-04-02 NOTE — Telephone Encounter (Signed)
Patient called to say that she can not take venlafaxine 75 mg because it makes her sick, patient states she told the provider this during the visit. Patient states that she does not have generalized anxiety disorder and this is inaccurate documentation in her chart and she wants it corrected ASAP.  Patient then expressed that her check out papers do not explain anything from her visit.

## 2023-04-02 NOTE — Telephone Encounter (Signed)
Patients son [name] called also and stated that he is now having to deal with a bunch of drama following his mothers visit today. [name] states he picked up patients rx, however she is refusing to take because that is the same medication that his grandmother took and it made her sick and his mother, Saniyah is convinced that it will make her sick.  [name] states he called our office prior to today's visit and explained how this visit was suppose to go and he hoped that Dr.Veludandi would speak with him prior or immediately following the visit.

## 2023-04-05 ENCOUNTER — Telehealth: Payer: Self-pay | Admitting: Sports Medicine

## 2023-04-05 NOTE — Telephone Encounter (Signed)
Patient's son, [name], called and stated that the medicine you prescribed for his mom [name]) was making her dizzy so she was probably going to discontinue taking it. He just wanted you to know this.

## 2023-04-05 NOTE — Telephone Encounter (Signed)
Patient called and states that new medication makes her sick and she's not going to take it, she states that she does NOT have any diagnoses in her chart that requires her to have Effexor. Patient request for another prescription to be sent into pharmacy. I told patient that if she doesn't agree with diagnosis that sending in another medication for diagnosis she states SHE DOES NOT HAVE isn't necessary. I offered patient appointment to speak to Dr.Veludandi and she refused. Patient doesn't remember she spoke to someone else earlier this week nor did she remember what was discussed.

## 2023-04-18 ENCOUNTER — Encounter: Payer: Self-pay | Admitting: Adult Health

## 2023-04-18 ENCOUNTER — Ambulatory Visit (INDEPENDENT_AMBULATORY_CARE_PROVIDER_SITE_OTHER): Payer: Medicare Other | Admitting: Adult Health

## 2023-04-18 VITALS — BP 120/70 | HR 68 | Temp 97.1°F | Resp 18 | Ht 62.0 in | Wt 171.8 lb

## 2023-04-18 DIAGNOSIS — I480 Paroxysmal atrial fibrillation: Secondary | ICD-10-CM

## 2023-04-18 DIAGNOSIS — F411 Generalized anxiety disorder: Secondary | ICD-10-CM

## 2023-04-18 DIAGNOSIS — I10 Essential (primary) hypertension: Secondary | ICD-10-CM

## 2023-04-18 DIAGNOSIS — Z13220 Encounter for screening for lipoid disorders: Secondary | ICD-10-CM

## 2023-04-18 DIAGNOSIS — R7303 Prediabetes: Secondary | ICD-10-CM

## 2023-04-18 DIAGNOSIS — F5101 Primary insomnia: Secondary | ICD-10-CM

## 2023-04-18 DIAGNOSIS — K219 Gastro-esophageal reflux disease without esophagitis: Secondary | ICD-10-CM

## 2023-04-18 DIAGNOSIS — R251 Tremor, unspecified: Secondary | ICD-10-CM | POA: Diagnosis not present

## 2023-04-18 DIAGNOSIS — Z0000 Encounter for general adult medical examination without abnormal findings: Secondary | ICD-10-CM

## 2023-04-18 NOTE — Progress Notes (Signed)
[facility] clinic  Provider:  Kenard Gower DNP  Code Status:  Full Code  Goals of Care:     04/18/2023    8:14 AM  Advanced Directives  Does Patient Have a Medical Advance Directive? Yes  Type of Estate agent of [place];Living will;Out of facility DNR (pink MOST or yellow form)  Does patient want to make changes to medical advance directive? No - Patient declined  Copy of Healthcare Power of Attorney in Chart? Yes - validated most recent copy scanned in chart (See row information)     Chief Complaint  Patient presents with   Follow-up    3 mth fu    Immunizations    DTAP, Shingrix and Covid   Quality Metric Gaps    Eye and Foot Exam, Diabetic kidney evaluation-Urine ACR, Dexa Scan, Medicare Annual Wellness and Hemoglobin A1C    HPI: Patient is a 77 y.o. female seen today for a 72-month follow up of chronic issues. She was accompanied today by her husband.  Tremor  -  takes Primidone  Paroxysmal atrial fibrillation (HCC)  -  denies palpitations, takes Toprol XL and Pradaxa  Primary hypertension - BP 120/70, takes HCTZ and lisinopril  Gastroesophageal reflux disease without esophagitis -denies acid reflux, takes Protonix  Primary insomnia -   sleeps 7 to 8 hours/night, takes trazodone    Past Medical History:  Diagnosis Date   Anticoagulated    Pradaxa   Bilateral leg pain    Right leg has greater pain than the left.   Coronary artery disease cardiologist-- dr Johney Frame   a.  s/p Xience DES to RCA 04/2009;   b. TEE 2/12: EF 40%, Large PFO;  c.  Lexiscan Myoview 05/2012: EF 69%, no ischemia. LHC (05/2012):  Ostial diagonal 30-40%, proximal mid ramus intermedius 40-50%, RCA stent patent with 40-50% after stent, then 40%, distal RCA 40-50%, EF 55-65%. Medical therapy continued.;  d.  Eugenie Birks Myoview (06/2013):  No ischemia, EF 83%, normal study   Full dentures    GERD (gastroesophageal reflux disease)    History of basal cell carcinoma excision     scalp   History of hiatal hernia    History of kidney stones 51 years ago   x1   History of recurrent UTIs    Hypertension    IFG (impaired fasting glucose)    MVP (mitral valve prolapse)    mild per last echo 04-03-2017 in epic   OA (osteoarthritis)    knees , fingers   PAF (paroxysmal atrial fibrillation) [facility])    cardiologist-- dr Johney Frame   S/P ablation of atrial fibrillation 08/15/2010   S/P drug eluting coronary stent placement 04/04/2009   Spinal stenosis    Tremor    Wears glasses     Past Surgical History:  Procedure Laterality Date   ABDOMINAL EXPOSURE N/A 05/11/2020   Procedure: ABDOMINAL EXPOSURE;  Surgeon: Larina Earthly, MD;  Location: [facility] OR;  Service: Vascular;  Laterality: N/A;   ANAL RECTAL MANOMETRY N/A 01/11/2014   Procedure: ANAL RECTAL MANOMETRY;  Surgeon: Romie Levee, MD;  [facility];  Service: Endoscopy;  Laterality: N/A;   APPENDECTOMY  1973   BACK SURGERY     laminectomy times 2; 2000 and 2001   BUNIONECTOMY Bilateral 2013   CARDIAC CATHETERIZATION  10-24-2010   dr allred   singl-vessel CAD with patent stent mRCA/  moderate disease mRCA beyond stent segment/  normal lvsf   CARDIAC ELECTROPHYSIOLOGY MAPPING AND ABLATION  08-15-2010  dr allred   CARDIOVASCULAR STRESS TEST  06-15-2013  dr allred   normal perfusion study/  no ischemia/  ef 83%   CARPAL TUNNEL RELEASE Right 25 years ago   CATARACT EXTRACTION W/ INTRAOCULAR LENS  IMPLANT, BILATERAL     CHOLECYSTECTOMY OPEN  1973   W/  APPENDECTOMY   CORONARY ANGIOPLASTY WITH STENT PLACEMENT  04-04-2009  dr Smitty Cords brodie   PCI and DES x1 to  mRCA/  mLAD 40%, pCX 30%, pRCA 30%/  normal LVF   FLEXIBLE SIGMOIDOSCOPY N/A 07/07/2013   Procedure: FLEXIBLE SIGMOIDOSCOPY;  Surgeon: Vertell Novak., MD;  [facility];  Service: Endoscopy;  Laterality: N/A;  unprepped   LEFT HEART CATH AND CORONARY ANGIOGRAPHY N/A 10/26/2019   Procedure: LEFT HEART CATH AND CORONARY ANGIOGRAPHY;  Surgeon:  Swazil[place], Peter M, MD;  Location: [facility] INVASIVE CV LAB;  Service: Cardiovascular;  Laterality: N/A;   LEFT HEART CATHETERIZATION WITH CORONARY ANGIOGRAM N/A 05/09/2012   Procedure: LEFT HEART CATHETERIZATION WITH CORONARY ANGIOGRAM;  Surgeon: Herby Abraham, MD;  Location: [facility] CATH LAB;  Service: Cardiovascular;  Laterality: N/A;   MOHS SURGERY  2009   scalp   PILONIDAL CYST EXCISION  2009;   2000;   1999   RECTAL ULTRASOUND N/A 01/11/2014   Procedure: RECTAL ULTRASOUND;  Surgeon: Romie Levee, MD;  [facility];  Service: Endoscopy;  Laterality: N/A;   TOTAL KNEE ARTHROPLASTY Right 09/07/2014   Procedure: RIGHT TOTAL KNEE ARTHROPLASTY;  Surgeon: Durene Romans, MD;  [facility] ORS;  Service: Orthopedics;  Laterality: Right;   TOTAL KNEE ARTHROPLASTY Left 12/20/2015   Procedure: LEFT TOTAL KNEE ARTHROPLASTY;  Surgeon: Durene Romans, MD;  [facility]: [facility]S;  Service: Orthopedics;  Laterality: Left;   TRANSTHORACIC ECHOCARDIOGRAM  04-03-2017   dr allred   Massie Maroon 55-60%/  mild MVP anterior leaflet (valve area 2.1cm^2) with mild regurg. , no stenosis/  trivial TR and PR   VAGINAL HYSTERECTOMY  1975   W/ BILATERAL SALPINOOPHOORECTOMY   VAGUS NERVE STIMULATOR INSERTION N/A 03/10/2014   Procedure: IMPLANTATION OF SACRAL  NERVE STIMULATOR ;  Surgeon: Romie Levee, MD;  Location: [facility];  Service: General;  Laterality: N/A;  Sacral. [employer]    Allergies  Allergen Reactions   Cephalexin Nausea Only    Outpatient Encounter Medications as of 04/18/2023  Medication Sig   dabigatran (PRADAXA) 150 MG CAPS capsule Take 1 capsule (150 mg total) by mouth 2 (two) times daily.   hydrochlorothiazide (MICROZIDE) 12.5 MG capsule Take 1 capsule (12.5 mg total) by mouth daily.   lisinopril (PRINIVIL,ZESTRIL) 10 MG tablet Take 1 tablet (10 mg total) by mouth daily.   metoprolol succinate (TOPROL-XL) 25 MG 24 hr tablet Take 25 mg by mouth daily.   pantoprazole (PROTONIX) 40 MG tablet Take 40 mg  by mouth at bedtime.   primidone (MYSOLINE) 50 MG tablet TAKE 1 TABLET TWICE A DAY   traZODone (DESYREL) 50 MG tablet Take 3 tablets (150 mg total) by mouth at bedtime.   venlafaxine XR (EFFEXOR XR) 75 MG 24 hr capsule Take 1 capsule (75 mg total) by mouth daily with breakfast.   [DISCONTINUED] dabigatran (PRADAXA) 150 MG CAPS capsule Take 1 capsule (150 mg total) by mouth 2 (two) times daily.   [DISCONTINUED] hydrochlorothiazide (HYDRODIURIL) 12.5 MG tablet Take 1 tablet (12.5 mg total) by mouth daily.   [DISCONTINUED] isosorbide mononitrate (IMDUR) 30 MG 24 hr tablet Take 30 mg by mouth daily.   [DISCONTINUED] metoprolol succinate (TOPROL-XL) 25  MG 24 hr tablet Take 1 tablet (25 mg total) by mouth 2 (two) times daily. Please make overdue appt with Dr. Johney Frame before anymore refills. Thank you 1st attempt   [DISCONTINUED] nitroGLYCERIN (NITROSTAT) 0.4 MG SL tablet Place 1 tablet (0.4 mg total) under the tongue every 5 (five) minutes as needed for chest pain.   [DISCONTINUED] pantoprazole (PROTONIX) 40 MG tablet Take 1 tablet (40 mg total) by mouth 2 (two) times daily. NEEDS VISIT FOR FURTHER REFILLS   [DISCONTINUED] primidone (MYSOLINE) 50 MG tablet Take 50 mg by mouth in the morning and at bedtime.   [DISCONTINUED] rosuvastatin (CRESTOR) 40 MG tablet Take 1 tablet (40 mg total) by mouth daily. Please call to schedule appointment for future refills. Final Attempt   No facility-administered encounter medications on file as of 04/18/2023.    Review of Systems:  Review of Systems  Constitutional:  Negative for appetite change, chills, fatigue and fever.  HENT:  Negative for congestion, hearing loss, rhinorrhea and sore throat.   Eyes: Negative.   Respiratory:  Negative for cough, shortness of breath and wheezing.   Cardiovascular:  Negative for chest pain, palpitations and leg swelling.  Gastrointestinal:  Negative for abdominal pain, constipation, diarrhea, nausea and vomiting.  Genitourinary:   Negative for dysuria.  Musculoskeletal:  Negative for arthralgias, back pain and myalgias.  Skin:  Negative for color change, rash and wound.  Neurological:  Negative for dizziness, weakness and headaches.  Psychiatric/Behavioral:  Negative for behavioral problems. The patient is not nervous/anxious.     Health Maintenance  Topic Date Due   FOOT EXAM  Never done   OPHTHALMOLOGY EXAM  Never done   Diabetic kidney evaluation - Urine ACR  Never done   DTaP/Tdap/Td (1 - Tdap) Never done   DEXA SCAN  Never done   Zoster Vaccines- Shingrix (2 of 2) 06/07/2017   Medicare Annual Wellness (AWV)  04/01/2018   HEMOGLOBIN A1C  02/15/2023   COVID-19 Vaccine (4 - 2023-24 season) 03/03/2023   Diabetic kidney evaluation - eGFR measurement  02/01/2024   Pneumonia Vaccine 63+ Years old  Completed   INFLUENZA VACCINE  Completed   Hepatitis C Screening  Completed   HPV VACCINES  Aged Out   Colonoscopy  Discontinued    Physical Exam: Vitals:   04/18/23 0813  BP: 120/70  Pulse: 68  Resp: 18  Temp: (!) 97.1 F (36.2 C)  SpO2: 96%  Weight: 171 lb 12.8 oz (77.9 kg)  Height: 5\' 2"  (1.575 m)   Body mass index is 31.42 kg/m. Physical Exam Constitutional:      General: She is not in acute distress.    Appearance: She is obese.  HENT:     Head: Normocephalic and atraumatic.     Nose: Nose normal.     Mouth/Throat:     Mouth: Mucous membranes are moist.  Eyes:     Conjunctiva/sclera: Conjunctivae normal.  Cardiovascular:     Rate and Rhythm: Normal rate and regular rhythm.  Pulmonary:     Effort: Pulmonary effort is normal.     Breath sounds: Normal breath sounds.  Abdominal:     General: Bowel sounds are normal.     Palpations: Abdomen is soft.  Musculoskeletal:        General: Normal range of motion.     Cervical back: Normal range of motion.  Skin:    General: Skin is warm and dry.  Neurological:     General: No focal deficit present.  Mental Status: She is alert and oriented  to person, place, and time.  Psychiatric:        Mood and Affect: Mood normal.        Behavior: Behavior normal.    Labs reviewed: Basic Metabolic Panel: Recent Labs    08/17/22 0000 11/30/22 2319 11/30/22 2327 02/01/23 1546  NA 141 136 138 139  K 3.9 3.0* 3.1* 3.4*  CL 101 104 102 100  CO2 21 20*  --  29  GLUCOSE  --  95 92 120  BUN 27* 28* 27* 18  CREATININE 1.6* 1.43* 1.70* 1.38*  CALCIUM 8.9 8.7*  --  9.8  TSH 1.65  --   --   --    Liver Function Tests: Recent Labs    08/17/22 0000 11/30/22 2319 02/01/23 1546  AST 27 22 15   ALT 24 23 10   ALKPHOS 91 40  --   BILITOT  --  0.2* 0.4  PROT  --  5.9* 6.8  ALBUMIN 4.1 3.9  --    Recent Labs    11/30/22 2319  LIPASE 44   No results for input(s): "AMMONIA" in the last 8760 hours. CBC: Recent Labs    10/19/22 1502 11/30/22 2319 11/30/22 2327 02/01/23 1546  WBC 5.8 4.2  --  6.6  NEUTROABS 3.3 2.4  --  4,224  HGB 10.5* 11.5* 11.6* 14.3  HCT 32.4* 36.0 34.0* 40.9  MCV 84.8 94.0  --  96.9  PLT 218.0 147*  --  214   Lipid Panel: Recent Labs    08/17/22 0000  CHOL 172  HDL 36  LDLCALC 100  TRIG 295*   Lab Results  Component Value Date   HGBA1C 6.1 08/17/2022    Procedures since last visit: No results found.  Assessment/Plan  1. Tremor -  no noted tremors -  Continue primidone  2. Paroxysmal atrial fibrillation (HCC) -   Rate controlled  -   continue Toprol-XL for rate control and Pradaxa for anticoagulation  3. Primary hypertension -   BP stable -    Continue current medications - Basic Metabolic Panel with eGFR  4. Gastroesophageal reflux disease without esophagitis -   Stable -   Continue Protonix  5. Primary insomnia -   Sleeps adequately at night, 7 to 8 hours/night -   Continue trazodone  6. Prediabetes -   Diet controlled - Hemoglobin A1C  7. Screening for hyperlipidemia - Lipid panel  8. GAD -  denies anxiety -  does not want to take Venlafaxine -  will discontinue  Venlafaxine per patient's request    Labs/tests ordered:   Lipid panel, A1c, BMP  Next appt:  Visit date not found

## 2023-04-19 LAB — BASIC METABOLIC PANEL WITH GFR
BUN/Creatinine Ratio: 22 (calc) (ref 6–22)
BUN: 27 mg/dL — ABNORMAL HIGH (ref 7–25)
CO2: 28 mmol/L (ref 20–32)
Calcium: 9.3 mg/dL (ref 8.6–10.4)
Chloride: 99 mmol/L (ref 98–110)
Creat: 1.21 mg/dL — ABNORMAL HIGH (ref 0.60–1.00)
Glucose, Bld: 81 mg/dL (ref 65–139)
Potassium: 3.6 mmol/L (ref 3.5–5.3)
Sodium: 138 mmol/L (ref 135–146)
eGFR: 46 mL/min/{1.73_m2} — ABNORMAL LOW (ref >=60)

## 2023-04-19 LAB — HEMOGLOBIN A1C
Hgb A1c MFr Bld: 5.7 %{Hb} — ABNORMAL HIGH (ref <5.7)
Mean Plasma Glucose: 117 mg/dL
eAG (mmol/L): 6.5 mmol/L

## 2023-04-19 LAB — LIPID PANEL
Cholesterol: 182 mg/dL
HDL: 47 mg/dL — ABNORMAL LOW
LDL Cholesterol (Calc): 101 mg/dL — ABNORMAL HIGH
Non-HDL Cholesterol (Calc): 135 mg/dL — ABNORMAL HIGH
Total CHOL/HDL Ratio: 3.9 (calc)
Triglycerides: 217 mg/dL — ABNORMAL HIGH

## 2023-04-22 NOTE — Progress Notes (Signed)
-    A1C 5.7, down from 6.1, very good! -   kidney function better now 46, up from 39 -  triglycerides 217, up from 210 (normal <150), will need to start on statin, can discuss on next visit, would you like to follow up sooner than July 19, 2023?

## 2023-04-23 ENCOUNTER — Other Ambulatory Visit: Payer: Self-pay | Admitting: Adult Health

## 2023-04-23 DIAGNOSIS — E782 Mixed hyperlipidemia: Secondary | ICD-10-CM

## 2023-04-23 MED ORDER — ATORVASTATIN CALCIUM 20 MG PO TABS
20.0000 mg | ORAL_TABLET | Freq: Every day | ORAL | 3 refills | Status: DC
Start: 2023-04-23 — End: 2023-05-23

## 2023-04-29 ENCOUNTER — Other Ambulatory Visit: Payer: Self-pay | Admitting: Orthopedic Surgery

## 2023-04-29 DIAGNOSIS — I10 Essential (primary) hypertension: Secondary | ICD-10-CM

## 2023-05-14 ENCOUNTER — Telehealth: Payer: Medicare Other

## 2023-05-14 NOTE — Telephone Encounter (Signed)
Message left on clinical intake voicemail:   Patient needs a refill on one of her medications, will run out prior to next appointment.  Call returned to patient, left message on voicemail for patient to return call when available . I need to know the name of the medication and confirm the designated pharmacy

## 2023-05-23 ENCOUNTER — Encounter: Payer: Self-pay | Admitting: Adult Health

## 2023-05-23 ENCOUNTER — Ambulatory Visit (INDEPENDENT_AMBULATORY_CARE_PROVIDER_SITE_OTHER): Payer: Medicare Other | Admitting: Adult Health

## 2023-05-23 VITALS — BP 122/78 | HR 79 | Temp 96.6°F | Resp 18 | Ht 62.0 in | Wt 174.0 lb

## 2023-05-23 DIAGNOSIS — R7303 Prediabetes: Secondary | ICD-10-CM

## 2023-05-23 DIAGNOSIS — I10 Essential (primary) hypertension: Secondary | ICD-10-CM | POA: Diagnosis not present

## 2023-05-23 DIAGNOSIS — K219 Gastro-esophageal reflux disease without esophagitis: Secondary | ICD-10-CM | POA: Diagnosis not present

## 2023-05-23 DIAGNOSIS — R251 Tremor, unspecified: Secondary | ICD-10-CM

## 2023-05-23 DIAGNOSIS — I480 Paroxysmal atrial fibrillation: Secondary | ICD-10-CM

## 2023-05-23 DIAGNOSIS — E782 Mixed hyperlipidemia: Secondary | ICD-10-CM | POA: Diagnosis not present

## 2023-05-23 DIAGNOSIS — E66811 Obesity, class 1: Secondary | ICD-10-CM

## 2023-05-23 DIAGNOSIS — Z6831 Body mass index (BMI) 31.0-31.9, adult: Secondary | ICD-10-CM

## 2023-05-23 DIAGNOSIS — F5101 Primary insomnia: Secondary | ICD-10-CM

## 2023-05-23 MED ORDER — ATORVASTATIN CALCIUM 20 MG PO TABS: 20.0000 mg | ORAL_TABLET | Freq: Every day | ORAL | 3 refills | Status: DC

## 2023-05-23 NOTE — Progress Notes (Signed)
[facility] clinic  Provider:  Kenard Gower DNP  Code Status:  Full Code  Goals of Care:     04/18/2023    8:14 AM  Advanced Directives  Does Patient Have a Medical Advance Directive? Yes  Type of Estate agent of [place];Living will;Out of facility DNR (pink MOST or yellow form)  Does patient want to make changes to medical advance directive? No - Patient declined  Copy of Healthcare Power of Attorney in Chart? Yes - validated most recent copy scanned in chart (See row information)     Chief Complaint  Patient presents with   Medication Management    medication refills     HPI: Patient is a 77 y.o. female seen today for an acute visit for  Mixed hyperlipidemia has not started Atorvastatin, chol 182, triglycerides 217, LDL 101  Paroxysmal atrial fibrillation (HCC) - does not have palpitations, takes Pradaxa and Metoprolol succinate  Primary hypertension -  BP 122/78, takes Metoprolol and Lisinopril  Gastroesophageal reflux disease without esophagitis -  stopped taking Pantoprazole, no heartburns  Tremor -  no tremors, does not want to take Primidone  Primary insomnia - sleeps good at night, takes Trazodone and Tylenol PM  Past Medical History:  Diagnosis Date   Anticoagulated    Pradaxa   Bilateral leg pain    Right leg has greater pain than the left.   Coronary artery disease cardiologist-- dr Johney Frame   a.  s/p Xience DES to RCA 04/2009;   b. TEE 2/12: EF 40%, Large PFO;  c.  Lexiscan Myoview 05/2012: EF 69%, no ischemia. LHC (05/2012):  Ostial diagonal 30-40%, proximal mid ramus intermedius 40-50%, RCA stent patent with 40-50% after stent, then 40%, distal RCA 40-50%, EF 55-65%. Medical therapy continued.;  d.  Eugenie Birks Myoview (06/2013):  No ischemia, EF 83%, normal study   Full dentures    GERD (gastroesophageal reflux disease)    History of basal cell carcinoma excision    scalp   History of hiatal hernia    History of kidney stones 51  years ago   x1   History of recurrent UTIs    Hypertension    IFG (impaired fasting glucose)    MVP (mitral valve prolapse)    mild per last echo 04-03-2017 in epic   OA (osteoarthritis)    knees , fingers   PAF (paroxysmal atrial fibrillation) [facility])    cardiologist-- dr Johney Frame   S/P ablation of atrial fibrillation 08/15/2010   S/P drug eluting coronary stent placement 04/04/2009   Spinal stenosis    Tremor    Wears glasses     Past Surgical History:  Procedure Laterality Date   ABDOMINAL EXPOSURE N/A 05/11/2020   Procedure: ABDOMINAL EXPOSURE;  Surgeon: Larina Earthly, MD;  Location: [facility] OR;  Service: Vascular;  Laterality: N/A;   ANAL RECTAL MANOMETRY N/A 01/11/2014   Procedure: ANAL RECTAL MANOMETRY;  Surgeon: Romie Levee, MD;  [facility];  Service: Endoscopy;  Laterality: N/A;   APPENDECTOMY  1973   BACK SURGERY     laminectomy times 2; 2000 and 2001   BUNIONECTOMY Bilateral 2013   CARDIAC CATHETERIZATION  10-24-2010   dr allred   singl-vessel CAD with patent stent mRCA/  moderate disease mRCA beyond stent segment/  normal lvsf   CARDIAC ELECTROPHYSIOLOGY MAPPING AND ABLATION  08-15-2010  dr allred   CARDIOVASCULAR STRESS TEST  06-15-2013  dr allred   normal perfusion study/  no ischemia/  ef 83%  CARPAL TUNNEL RELEASE Right 25 years ago   CATARACT EXTRACTION W/ INTRAOCULAR LENS  IMPLANT, BILATERAL     CHOLECYSTECTOMY OPEN  1973   W/  APPENDECTOMY   CORONARY ANGIOPLASTY WITH STENT PLACEMENT  04-04-2009  dr Smitty Cords brodie   PCI and DES x1 to  mRCA/  mLAD 40%, pCX 30%, pRCA 30%/  normal LVF   FLEXIBLE SIGMOIDOSCOPY N/A 07/07/2013   Procedure: FLEXIBLE SIGMOIDOSCOPY;  Surgeon: Vertell Novak., MD;  [facility];  Service: Endoscopy;  Laterality: N/A;  unprepped   LEFT HEART CATH AND CORONARY ANGIOGRAPHY N/A 10/26/2019   Procedure: LEFT HEART CATH AND CORONARY ANGIOGRAPHY;  Surgeon: Swazil[place], Peter M, MD;  Location: [facility] INVASIVE CV LAB;  Service:  Cardiovascular;  Laterality: N/A;   LEFT HEART CATHETERIZATION WITH CORONARY ANGIOGRAM N/A 05/09/2012   Procedure: LEFT HEART CATHETERIZATION WITH CORONARY ANGIOGRAM;  Surgeon: Herby Abraham, MD;  Location: [facility] CATH LAB;  Service: Cardiovascular;  Laterality: N/A;   MOHS SURGERY  2009   scalp   PILONIDAL CYST EXCISION  2009;   2000;   1999   RECTAL ULTRASOUND N/A 01/11/2014   Procedure: RECTAL ULTRASOUND;  Surgeon: Romie Levee, MD;  [facility];  Service: Endoscopy;  Laterality: N/A;   TOTAL KNEE ARTHROPLASTY Right 09/07/2014   Procedure: RIGHT TOTAL KNEE ARTHROPLASTY;  Surgeon: Durene Romans, MD;  [facility];  Service: Orthopedics;  Laterality: Right;   TOTAL KNEE ARTHROPLASTY Left 12/20/2015   Procedure: LEFT TOTAL KNEE ARTHROPLASTY;  Surgeon: Durene Romans, MD;  [facility];  Service: Orthopedics;  Laterality: Left;   TRANSTHORACIC ECHOCARDIOGRAM  04-03-2017   dr allred   Massie Maroon 55-60%/  mild MVP anterior leaflet (valve area 2.1cm^2) with mild regurg. , no stenosis/  trivial TR and PR   VAGINAL HYSTERECTOMY  1975   W/ BILATERAL SALPINOOPHOORECTOMY   VAGUS NERVE STIMULATOR INSERTION N/A 03/10/2014   Procedure: IMPLANTATION OF SACRAL  NERVE STIMULATOR ;  Surgeon: Romie Levee, MD;  Location: [facility] [place];  Service: General;  Laterality: N/A;  Sacral. [employer]    Allergies  Allergen Reactions   Cephalexin Nausea Only    Outpatient Encounter Medications as of 05/23/2023  Medication Sig   dabigatran (PRADAXA) 150 MG CAPS capsule Take 1 capsule (150 mg total) by mouth 2 (two) times daily.   hydrochlorothiazide (MICROZIDE) 12.5 MG capsule TAKE 1 CAPSULE DAILY   lisinopril (PRINIVIL,ZESTRIL) 10 MG tablet Take 1 tablet (10 mg total) by mouth daily.   metoprolol succinate (TOPROL-XL) 25 MG 24 hr tablet Take 25 mg by mouth daily.   pantoprazole (PROTONIX) 40 MG tablet Take 40 mg by mouth at bedtime.   primidone (MYSOLINE) 50 MG tablet TAKE 1 TABLET TWICE A DAY    traZODone (DESYREL) 50 MG tablet Take 3 tablets (150 mg total) by mouth at bedtime.   atorvastatin (LIPITOR) 20 MG tablet Take 1 tablet (20 mg total) by mouth daily. (Patient not taking: Reported on 05/23/2023)   No facility-administered encounter medications on file as of 05/23/2023.    Review of Systems:  Review of Systems  Constitutional:  Negative for appetite change, chills, fatigue and fever.  HENT:  Negative for congestion, hearing loss, rhinorrhea and sore throat.   Eyes: Negative.   Respiratory:  Negative for cough, shortness of breath and wheezing.   Cardiovascular:  Negative for chest pain, palpitations and leg swelling.  Gastrointestinal:  Positive for diarrhea. Negative for abdominal pain, constipation, nausea and vomiting.  Genitourinary:  Negative for dysuria.  Musculoskeletal:  Negative for arthralgias, back pain and myalgias.  Skin:  Negative for color change, rash and wound.  Neurological:  Negative for dizziness, weakness and headaches.  Psychiatric/Behavioral:  Negative for behavioral problems. The patient is not nervous/anxious.     Health Maintenance  Topic Date Due   Diabetic kidney evaluation - Urine ACR  Never done   DTaP/Tdap/Td (1 - Tdap) Never done   DEXA SCAN  Never done   Zoster Vaccines- Shingrix (2 of 2) 06/07/2017   Medicare Annual Wellness (AWV)  04/01/2018   COVID-19 Vaccine (4 - 2023-24 season) 03/03/2023   OPHTHALMOLOGY EXAM  04/03/2023   HEMOGLOBIN A1C  10/17/2023   Diabetic kidney evaluation - eGFR measurement  04/17/2024   FOOT EXAM  04/17/2024   Pneumonia Vaccine 66+ Years old  Completed   INFLUENZA VACCINE  Completed   Hepatitis C Screening  Completed   HPV VACCINES  Aged Out   Colonoscopy  Discontinued    Physical Exam: Vitals:   05/23/23 1320  BP: 122/78  Pulse: 79  Resp: 18  Temp: (!) 96.6 F (35.9 C)  SpO2: 97%  Weight: 174 lb (78.9 kg)  Height: 5\' 2"  (1.575 m)   Body mass index is 31.83 kg/m. Physical  Exam Constitutional:      Appearance: She is obese.  HENT:     Head: Normocephalic and atraumatic.     Nose: Nose normal.     Mouth/Throat:     Mouth: Mucous membranes are moist.  Eyes:     Conjunctiva/sclera: Conjunctivae normal.  Cardiovascular:     Rate and Rhythm: Normal rate and regular rhythm.  Pulmonary:     Effort: Pulmonary effort is normal.     Breath sounds: Normal breath sounds.  Abdominal:     General: Bowel sounds are normal.     Palpations: Abdomen is soft.  Musculoskeletal:        General: Normal range of motion.     Cervical back: Normal range of motion.  Skin:    General: Skin is warm and dry.  Neurological:     General: No focal deficit present.     Mental Status: She is alert and oriented to person, place, and time.  Psychiatric:        Mood and Affect: Mood normal.        Behavior: Behavior normal.        Thought Content: Thought content normal.        Judgment: Judgment normal.     Labs reviewed: Basic Metabolic Panel: Recent Labs    08/17/22 0000 08/17/22 0000 11/30/22 2319 11/30/22 2327 02/01/23 1546 04/18/23 1058  NA 141   < > 136 138 139 138  K 3.9  --  3.0* 3.1* 3.4* 3.6  CL 101  --  104 102 100 99  CO2 21  --  20*  --  29 28  GLUCOSE  --    < > 95 92 120 81  BUN 27*   < > 28* 27* 18 27*  CREATININE 1.6*   < > 1.43* 1.70* 1.38* 1.21*  CALCIUM 8.9  --  8.7*  --  9.8 9.3  TSH 1.65  --   --   --   --   --    < > = values in this interval not displayed.   Liver Function Tests: Recent Labs    08/17/22 0000 11/30/22 2319 02/01/23 1546  AST 27 22 15   ALT 24 23 10   ALKPHOS  91 40  --   BILITOT  --  0.2* 0.4  PROT  --  5.9* 6.8  ALBUMIN 4.1 3.9  --    Recent Labs    11/30/22 2319  LIPASE 44   No results for input(s): "AMMONIA" in the last 8760 hours. CBC: Recent Labs    10/19/22 1502 11/30/22 2319 11/30/22 2327 02/01/23 1546  WBC 5.8 4.2  --  6.6  NEUTROABS 3.3 2.4  --  4,224  HGB 10.5* 11.5* 11.6* 14.3  HCT 32.4*  36.0 34.0* 40.9  MCV 84.8 94.0  --  96.9  PLT 218.0 147*  --  214   Lipid Panel: Recent Labs    08/17/22 0000 04/18/23 1058  CHOL 172 182  HDL 36 47*  LDLCALC 100 101*  TRIG 210* 217*  CHOLHDL  --  3.9   Lab Results  Component Value Date   HGBA1C 5.7 (H) 04/18/2023    Procedures since last visit: No results found.  Assessment/Plan  1. Mixed hyperlipidemia Lab Results  Component Value Date   CHOL 182 04/18/2023   HDL 47 (L) 04/18/2023   LDLCALC 101 (H) 04/18/2023   TRIG 217 (H) 04/18/2023   CHOLHDL 3.9 04/18/2023    -  stated that she has not started taking Atorvastatin since she has not received it yet - atorvastatin (LIPITOR) 20 MG tablet; Take 1 tablet (20 mg total) by mouth daily.  Dispense: 90 tablet; Refill: 3  2. Paroxysmal atrial fibrillation (HCC) -  rate-controlled -  continue Pradaxa for anticoagulation and Metoprolol succinate for rate-control  3. Primary hypertension -  BP stable -  continue Lisinopril and Metoprolol succinate  4. Gastroesophageal reflux disease without esophagitis -  does not want to take Pantoprazole  5. Tremor -  does not want to take Primidone  6. Primary insomnia - sleeps good - continue Trazodone  7. Class 1 obesity with body mass index (BMI) of 31.0 to 31.9 in adult, unspecified obesity type, unspecified whether serious comorbidity present -  counseled on diet and exercise  8. Prediabetes Lab Results  Component Value Date   HGBA1C 5.7 (H) 04/18/2023   -  diet-controlled     Labs/tests ordered:  None  Next appt:  07/19/2023

## 2023-05-27 ENCOUNTER — Telehealth: Payer: Self-pay

## 2023-05-27 ENCOUNTER — Other Ambulatory Visit: Payer: Self-pay | Admitting: Adult Health

## 2023-05-27 DIAGNOSIS — K219 Gastro-esophageal reflux disease without esophagitis: Secondary | ICD-10-CM

## 2023-05-27 MED ORDER — PANTOPRAZOLE SODIUM 40 MG PO TBEC: 40.0000 mg | DELAYED_RELEASE_TABLET | Freq: Every evening | ORAL | 3 refills | Status: DC

## 2023-05-27 NOTE — Telephone Encounter (Signed)
[facility]

## 2023-05-27 NOTE — Telephone Encounter (Signed)
Providers reply:   Gillis Santa, NP  You4 minutes ago (12:41 PM)    English is the language used here. How often do you take your Protonix?  Monina    Below is the rx that was removed on 05/23/23:

## 2023-05-27 NOTE — Telephone Encounter (Signed)
Patient called stating she needs a refill on protonix. Patient is aware that medication was removed with the indication patient does not want to take. Patient emphasized that she thinks there is a language barrier and that is how the medication got removed from her list.   Patient would like medication added back and refills sent to [facility]

## 2023-05-27 NOTE — Telephone Encounter (Signed)
Patient is aware 

## 2023-05-29 ENCOUNTER — Telehealth: Payer: Self-pay

## 2023-05-29 NOTE — Telephone Encounter (Signed)
Patient called and reported that [facility] stated Protonix wasn't send to pharmacy. She was advise that medication was send on 05/27/23. Also stated that [facility] keep sending out trazodone but she has a lot and was advised that the last refill from our office was back in [phone] for one month w/ one refill.

## 2023-06-11 ENCOUNTER — Encounter: Payer: Self-pay | Admitting: Sports Medicine

## 2023-06-11 ENCOUNTER — Ambulatory Visit (INDEPENDENT_AMBULATORY_CARE_PROVIDER_SITE_OTHER): Payer: Medicare Other | Admitting: Sports Medicine

## 2023-06-11 VITALS — BP 138/76 | HR 83 | Temp 97.0°F | Resp 16 | Ht 62.0 in | Wt 172.8 lb

## 2023-06-11 DIAGNOSIS — K5900 Constipation, unspecified: Secondary | ICD-10-CM | POA: Diagnosis not present

## 2023-06-11 DIAGNOSIS — K649 Unspecified hemorrhoids: Secondary | ICD-10-CM

## 2023-06-11 LAB — CBC
HCT: 36.2 % (ref 35.0–45.0)
Hemoglobin: 12.4 g/dL (ref 11.7–15.5)
MCH: 34.3 pg — ABNORMAL HIGH (ref 27.0–33.0)
MCHC: 34.3 g/dL (ref 32.0–36.0)
MCV: 100.3 fL — ABNORMAL HIGH (ref 80.0–100.0)
MPV: 10.7 fL (ref 7.5–12.5)
Platelets: 176 10*3/uL (ref 140–400)
RBC: 3.61 10*6/uL — ABNORMAL LOW (ref 3.80–5.10)
RDW: 11.8 % (ref 11.0–15.0)
WBC: 4.5 10*3/uL (ref 3.8–10.8)

## 2023-06-11 MED ORDER — DOCUSATE SODIUM 100 MG PO CAPS
100.0000 mg | ORAL_CAPSULE | Freq: Two times a day (BID) | ORAL | 0 refills | Status: DC
Start: 2023-06-11 — End: 2023-08-01

## 2023-06-11 NOTE — Progress Notes (Signed)
Careteam: Patient Care Team: Gillis Santa, NP as PCP - General (Internal Medicine) Lanier Prude, MD as PCP - Electrophysiology (Cardiology) Venita Lick, MD as Consulting Physician (Orthopedic Surgery)  PLACE OF SERVICE:  [facility]  Advanced Directive information    Allergies  Allergen Reactions   Cephalexin Nausea Only    No chief complaint on file.    HPI: Patient is a 77 y.o. female  presented to clinic  for acute visit  Son made appt for rectal bleeding , patient not sure why she is here  States she has hemorroidal bleeding on and off since she child birth  Says its no big deal  Reports fresh blood Vitals stable, denies being dizzy or lightheaded Moves bowels once every 3 days  Denies abdominal pain, nausea, vomiting  Reports good appetite  Ate coffee and sweet roll Does not take any stool softeners  Drinks about 3-4 bottles States she does not eat lot of vegetables  Pt denies feeling dizzy  or lightheaded  Pt states she is ok  when asked about her mood Denies being worried  No problem relaxing  Husband does not comment anything  Her som who made this appt called front desk lady saying that she had nervous breakdown  Pt was started on effexor in the past and she did not tolerate She is not interested in taking any medicines States she does not need any and she is fine  .   Review of Systems:  Review of Systems  Constitutional:  Negative for chills and fever.  HENT:  Negative for congestion and sore throat.   Eyes:  Negative for double vision.  Respiratory:  Negative for cough, sputum production and shortness of breath.   Cardiovascular:  Negative for chest pain, palpitations and leg swelling.  Gastrointestinal:  Positive for blood in stool and constipation. Negative for abdominal pain, heartburn and nausea.  Genitourinary:  Negative for dysuria, frequency and hematuria.  Musculoskeletal:  Negative for falls and myalgias.   Neurological:  Negative for dizziness, sensory change and focal weakness.   Negative unless indicated in HPI.   Past Medical History:  Diagnosis Date   Anticoagulated    Pradaxa   Bilateral leg pain    Right leg has greater pain than the left.   Coronary artery disease cardiologist-- dr Johney Frame   a.  s/p Xience DES to RCA 04/2009;   b. TEE 2/12: EF 40%, Large PFO;  c.  Lexiscan Myoview 05/2012: EF 69%, no ischemia. LHC (05/2012):  Ostial diagonal 30-40%, proximal mid ramus intermedius 40-50%, RCA stent patent with 40-50% after stent, then 40%, distal RCA 40-50%, EF 55-65%. Medical therapy continued.;  d.  Eugenie Birks Myoview (06/2013):  No ischemia, EF 83%, normal study   Full dentures    GERD (gastroesophageal reflux disease)    History of basal cell carcinoma excision    scalp   History of hiatal hernia    History of kidney stones 51 years ago   x1   History of recurrent UTIs    Hypertension    IFG (impaired fasting glucose)    MVP (mitral valve prolapse)    mild per last echo 04-03-2017 in epic   OA (osteoarthritis)    knees , fingers   PAF (paroxysmal atrial fibrillation) [facility])    cardiologist-- dr Johney Frame   S/P ablation of atrial fibrillation 08/15/2010   S/P drug eluting coronary stent placement 04/04/2009   Spinal stenosis    Tremor    Wears  glasses    Past Surgical History:  Procedure Laterality Date   ABDOMINAL EXPOSURE N/A 05/11/2020   Procedure: ABDOMINAL EXPOSURE;  Surgeon: Larina Earthly, MD;  Location: [facility] OR;  Service: Vascular;  Laterality: N/A;   ANAL RECTAL MANOMETRY N/A 01/11/2014   Procedure: ANAL RECTAL MANOMETRY;  Surgeon: Romie Levee, MD;  Location: [facility] ENDOSCOPY;  Service: Endoscopy;  Laterality: N/A;   APPENDECTOMY  1973   BACK SURGERY     laminectomy times 2; 2000 and 2001   BUNIONECTOMY Bilateral 2013   CARDIAC CATHETERIZATION  10-24-2010   dr allred   singl-vessel CAD with patent stent mRCA/  moderate disease mRCA beyond stent segment/  normal lvsf    CARDIAC ELECTROPHYSIOLOGY MAPPING AND ABLATION  08-15-2010  dr allred   CARDIOVASCULAR STRESS TEST  06-15-2013  dr allred   normal perfusion study/  no ischemia/  ef 83%   CARPAL TUNNEL RELEASE Right 25 years ago   CATARACT EXTRACTION W/ INTRAOCULAR LENS  IMPLANT, BILATERAL     CHOLECYSTECTOMY OPEN  1973   W/  APPENDECTOMY   CORONARY ANGIOPLASTY WITH STENT PLACEMENT  04-04-2009  dr Smitty Cords brodie   PCI and DES x1 to  mRCA/  mLAD 40%, pCX 30%, pRCA 30%/  normal LVF   FLEXIBLE SIGMOIDOSCOPY N/A 07/07/2013   Procedure: FLEXIBLE SIGMOIDOSCOPY;  Surgeon: Vertell Novak., MD;  Location: [facility];  Service: Endoscopy;  Laterality: N/A;  unprepped   LEFT HEART CATH AND CORONARY ANGIOGRAPHY N/A 10/26/2019   Procedure: LEFT HEART CATH AND CORONARY ANGIOGRAPHY;  Surgeon: Swazil[place], Peter M, MD;  Location: [facility] INVASIVE CV LAB;  Service: Cardiovascular;  Laterality: N/A;   LEFT HEART CATHETERIZATION WITH CORONARY ANGIOGRAM N/A 05/09/2012   Procedure: LEFT HEART CATHETERIZATION WITH CORONARY ANGIOGRAM;  Surgeon: Herby Abraham, MD;  Location: [facility] CATH LAB;  Service: Cardiovascular;  Laterality: N/A;   MOHS SURGERY  2009   scalp   PILONIDAL CYST EXCISION  2009;   2000;   1999   RECTAL ULTRASOUND N/A 01/11/2014   Procedure: RECTAL ULTRASOUND;  Surgeon: Romie Levee, MD;  Location: [facility];  Service: Endoscopy;  Laterality: N/A;   TOTAL KNEE ARTHROPLASTY Right 09/07/2014   Procedure: RIGHT TOTAL KNEE ARTHROPLASTY;  Surgeon: Durene Romans, MD;  Location: [facility];  Service: Orthopedics;  Laterality: Right;   TOTAL KNEE ARTHROPLASTY Left 12/20/2015   Procedure: LEFT TOTAL KNEE ARTHROPLASTY;  Surgeon: Durene Romans, MD;  Location: [facility];  Service: Orthopedics;  Laterality: Left;   TRANSTHORACIC ECHOCARDIOGRAM  04-03-2017   dr allred   Massie Maroon 55-60%/  mild MVP anterior leaflet (valve area 2.1cm^2) with mild regurg. , no stenosis/  trivial TR and PR   VAGINAL HYSTERECTOMY  1975   W/ BILATERAL SALPINOOPHOORECTOMY    VAGUS NERVE STIMULATOR INSERTION N/A 03/10/2014   Procedure: IMPLANTATION OF SACRAL  NERVE STIMULATOR ;  Surgeon: Romie Levee, MD;  Location: [facility];  Service: General;  Laterality: N/A;  Sacral. [employer]   Social History:   reports that she quit smoking about 34 years ago. Her smoking use included cigarettes. She started smoking about 54 years ago. She has a 10 pack-year smoking history. She has never used smokeless tobacco. She reports that she does not drink alcohol and does not use drugs.  Family History  Problem Relation Age of Onset   Heart disease Mother    Coronary artery disease Father    Heart disease Sister    Heart disease Brother    Colon cancer Neg  Hx    Esophageal cancer Neg Hx    Rectal cancer Neg Hx    Stomach cancer Neg Hx     Medications: Patient's Medications  New Prescriptions   No medications on file  Previous Medications   ATORVASTATIN (LIPITOR) 20 MG TABLET    Take 1 tablet (20 mg total) by mouth daily.   DABIGATRAN (PRADAXA) 150 MG CAPS CAPSULE    Take 1 capsule (150 mg total) by mouth 2 (two) times daily.   HYDROCHLOROTHIAZIDE (MICROZIDE) 12.5 MG CAPSULE    TAKE 1 CAPSULE DAILY   LISINOPRIL (PRINIVIL,ZESTRIL) 10 MG TABLET    Take 1 tablet (10 mg total) by mouth daily.   METOPROLOL SUCCINATE (TOPROL-XL) 25 MG 24 HR TABLET    Take 25 mg by mouth daily.   PANTOPRAZOLE (PROTONIX) 40 MG TABLET    Take 1 tablet (40 mg total) by mouth at bedtime.   TRAZODONE (DESYREL) 50 MG TABLET    Take 3 tablets (150 mg total) by mouth at bedtime.  Modified Medications   No medications on file  Discontinued Medications   No medications on file    Physical Exam: There were no vitals filed for this visit. There is no height or weight on file to calculate BMI. BP Readings from Last 3 Encounters:  05/23/23 122/78  04/18/23 120/70  04/02/23 122/64   Wt Readings from Last 3 Encounters:  05/23/23 174 lb (78.9 kg)  04/18/23 171 lb 12.8 oz (77.9 kg)   04/02/23 169 lb (76.7 kg)    Physical Exam Constitutional:      Appearance: Normal appearance.  Cardiovascular:     Rate and Rhythm: Normal rate and regular rhythm.  Pulmonary:     Effort: Pulmonary effort is normal. No respiratory distress.     Breath sounds: Normal breath sounds. No wheezing.  Abdominal:     General: Bowel sounds are normal. There is no distension.     Tenderness: There is no abdominal tenderness. There is no guarding or rebound.     Comments:    Genitourinary:    Comments: Declined rectal exam Musculoskeletal:        General: No swelling or tenderness.  Skin:    General: Skin is dry.  Neurological:     Mental Status: She is alert. Mental status is at baseline.     Sensory: No sensory deficit.     Motor: No weakness.     Labs reviewed: Basic Metabolic Panel: Recent Labs    08/17/22 0000 08/17/22 0000 11/30/22 2319 11/30/22 2327 02/01/23 1546 04/18/23 1058  NA 141   < > 136 138 139 138  K 3.9  --  3.0* 3.1* 3.4* 3.6  CL 101  --  104 102 100 99  CO2 21  --  20*  --  29 28  GLUCOSE  --    < > 95 92 120 81  BUN 27*   < > 28* 27* 18 27*  CREATININE 1.6*   < > 1.43* 1.70* 1.38* 1.21*  CALCIUM 8.9  --  8.7*  --  9.8 9.3  TSH 1.65  --   --   --   --   --    < > = values in this interval not displayed.   Liver Function Tests: Recent Labs    08/17/22 0000 11/30/22 2319 02/01/23 1546  AST 27 22 15   ALT 24 23 10   ALKPHOS 91 40  --   BILITOT  --  0.2* 0.4  PROT  --  5.9* 6.8  ALBUMIN 4.1 3.9  --    Recent Labs    11/30/22 2319  LIPASE 44   No results for input(s): "AMMONIA" in the last 8760 hours. CBC: Recent Labs    10/19/22 1502 11/30/22 2319 11/30/22 2327 02/01/23 1546  WBC 5.8 4.2  --  6.6  NEUTROABS 3.3 2.4  --  4,224  HGB 10.5* 11.5* 11.6* 14.3  HCT 32.4* 36.0 34.0* 40.9  MCV 84.8 94.0  --  96.9  PLT 218.0 147*  --  214   Lipid Panel: Recent Labs    08/17/22 0000 04/18/23 1058  CHOL 172 182  HDL 36 47*  LDLCALC 100  101*  TRIG 210* 217*  CHOLHDL  --  3.9   TSH: Recent Labs    08/17/22 0000  TSH 1.65   A1C: Lab Results  Component Value Date   HGBA1C 5.7 (H) 04/18/2023     Assessment/Plan  1. Constipation, unspecified constipation type  Instructed patient to increase oral intake  Will send colace to her pharmacy - docusate sodium (COLACE) 100 MG capsule; Take 1 capsule (100 mg total) by mouth 2 (two) times daily.  Dispense: 10 capsule; Refill: 0  2. Hemorrhoids, unspecified hemorrhoid type Declined per rectal exam  Instructed patient to use hemorrhoidal rectal cream otc  Will check cbc - CBC (no diff)   No follow-ups on file.:

## 2023-06-11 NOTE — Patient Instructions (Signed)
Anusol , hydrocortisone 2.5 % rectal cream for hemorrhoids

## 2023-07-19 ENCOUNTER — Ambulatory Visit: Payer: Medicare Other | Admitting: Adult Health

## 2023-07-31 ENCOUNTER — Telehealth: Payer: Self-pay

## 2023-07-31 NOTE — Telephone Encounter (Signed)
Patient states she needs a referral to a GI doctor for this constant Diarrhea, I Informed patient that it appears she is established with [facility] and patient responded by saying " They will not see me without a referral."

## 2023-07-31 NOTE — Telephone Encounter (Signed)
Patient also stated she is not taking any blood pressure medications due to them causing diarrhea. Patient is scheduled to see Dinah tomorrow to further discuss

## 2023-08-01 ENCOUNTER — Encounter: Payer: Self-pay | Admitting: Family

## 2023-08-01 ENCOUNTER — Ambulatory Visit (INDEPENDENT_AMBULATORY_CARE_PROVIDER_SITE_OTHER): Payer: Medicare Other | Admitting: Family

## 2023-08-01 VITALS — BP 124/72 | HR 91 | Temp 97.8°F | Resp 20 | Ht 62.0 in | Wt 164.6 lb

## 2023-08-01 DIAGNOSIS — E782 Mixed hyperlipidemia: Secondary | ICD-10-CM

## 2023-08-01 DIAGNOSIS — I480 Paroxysmal atrial fibrillation: Secondary | ICD-10-CM

## 2023-08-01 DIAGNOSIS — F5101 Primary insomnia: Secondary | ICD-10-CM

## 2023-08-01 DIAGNOSIS — I10 Essential (primary) hypertension: Secondary | ICD-10-CM | POA: Diagnosis not present

## 2023-08-01 DIAGNOSIS — K219 Gastro-esophageal reflux disease without esophagitis: Secondary | ICD-10-CM | POA: Diagnosis not present

## 2023-08-01 NOTE — Telephone Encounter (Signed)
Medina-Vargas, Monina C, NP  You37 minutes ago (9:19 AM)    Noted. It is good she will be seen today for evaluation. Thanks.

## 2023-08-01 NOTE — Progress Notes (Signed)
Provider: Carilyn Goodpasture Laureano Hetzer FNP-C  Medina-Vargas, Margit Banda, NP  Patient Care Team: Gillis Santa, NP as PCP - General (Internal Medicine) Lanier Prude, MD as PCP - Electrophysiology (Cardiology) Venita Lick, MD as Consulting Physician (Orthopedic Surgery)  Extended Emergency Contact Information Primary Emergency Contact: [facility] Address: [address]          [place], [place] [address] Darden Amber of [place] Home Phone: [phone] Mobile Phone: [phone] Relation: Son Secondary Emergency Contact: Henkes,Floyd Address: [address]          [place], [place] [address] Darden Amber of [place] Home Phone: [phone] Mobile Phone: [phone] Relation: Spouse  Code Status:Full Code  Goals of care: Advanced Directive information    08/01/2023    9:45 AM  Advanced Directives  Does Patient Have a Medical Advance Directive? Yes  Type of Estate agent of [place];Out of facility DNR (pink MOST or yellow form);Living will  Copy of Healthcare Power of Attorney in Chart? Yes - validated most recent copy scanned in chart (See row information)     Chief Complaint  Patient presents with   Medical Management of Chronic Issues    Discuss blood pressure medications.    HPI:  Pt is a 78 y.o. female seen today for an acute visit to discuss blood pressure medication.However,states does not know why she is here today.States did not book appointment was just called yesterday and told to come for a visit.tells me there's nothing wrong with her blood pressure.takes Lisinopril 10 mg ,Hydrochlorothiazide 12.5 mg but does not take Metoprolol.states Husband's medication was mixed with hers wants metoprolol discontinued from her medication list. Also states [facility] been [facility] medication that she does not take.she did not bring her medication bottle but had list of medication from pharmacy.she continues to insist that medication was  prescribed from this office.recommended for patient to speak with the Office manage at the end of the visit to discuss concern since just saw her for the first time this visit.Diclofenac 75 mg daily listed though was not prescribed by PCP states took diclofenac several years ago for her hip pain prescribed by Orthopedic.she will notify pharmacy not to send diclofenac. Advised to bring medication bottles to visit.   All medication reviewed with patient.states does not take colace.States has had IBS for several years does not need stool softener.patient was seen here on 06/10/2024 by Dr.Veludandi for constipation and Hemorrhoids  was prescribed Colace and anusol Hydrocortisone 2.5% Per rectal cream but states does not recall being seen for constipation and has no Hemorrhoids though on chart review patient signed consent for treatment on 06/10/2024.  Patient agrees to talking other medication as list Protonix 40 mg tablet for acid reflux.states symptoms controlled. Takes Pradaxa for Afib.Again states not taking Metoprolol denies any palpitation,fatigue,chest pain or shortness of breath. Patient takes Trazodone for sleep.    Past Medical History:  Diagnosis Date   Anticoagulated    Pradaxa   Bilateral leg pain    Right leg has greater pain than the left.   Coronary artery disease cardiologist-- dr Johney Frame   a.  s/p Xience DES to RCA 04/2009;   b. TEE 2/12: EF 40%, Large PFO;  c.  Lexiscan Myoview 05/2012: EF 69%, no ischemia. LHC (05/2012):  Ostial diagonal 30-40%, proximal mid ramus intermedius 40-50%, RCA stent patent with 40-50% after stent, then 40%, distal RCA 40-50%, EF 55-65%. Medical therapy continued.;  d.  Eugenie Birks Myoview (06/2013):  No ischemia, EF 83%, normal study  Full dentures    GERD (gastroesophageal reflux disease)    History of basal cell carcinoma excision    scalp   History of hiatal hernia    History of kidney stones 51 years ago   x1   History of recurrent UTIs     Hypertension    IFG (impaired fasting glucose)    MVP (mitral valve prolapse)    mild per last echo 04-03-2017 in epic   OA (osteoarthritis)    knees , fingers   PAF (paroxysmal atrial fibrillation) [facility])    cardiologist-- dr Johney Frame   S/P ablation of atrial fibrillation 08/15/2010   S/P drug eluting coronary stent placement 04/04/2009   Spinal stenosis    Tremor    Wears glasses    Past Surgical History:  Procedure Laterality Date   ABDOMINAL EXPOSURE N/A 05/11/2020   Procedure: ABDOMINAL EXPOSURE;  Surgeon: Larina Earthly, MD;  Location: [facility] OR;  Service: Vascular;  Laterality: N/A;   ANAL RECTAL MANOMETRY N/A 01/11/2014   Procedure: ANAL RECTAL MANOMETRY;  Surgeon: Romie Levee, MD;  Location: [facility] ENDOSCOPY;  Service: Endoscopy;  Laterality: N/A;   APPENDECTOMY  1973   BACK SURGERY     laminectomy times 2; 2000 and 2001   BUNIONECTOMY Bilateral 2013   CARDIAC CATHETERIZATION  10-24-2010   dr allred   singl-vessel CAD with patent stent mRCA/  moderate disease mRCA beyond stent segment/  normal lvsf   CARDIAC ELECTROPHYSIOLOGY MAPPING AND ABLATION  08-15-2010  dr allred   CARDIOVASCULAR STRESS TEST  06-15-2013  dr allred   normal perfusion study/  no ischemia/  ef 83%   CARPAL TUNNEL RELEASE Right 25 years ago   CATARACT EXTRACTION W/ INTRAOCULAR LENS  IMPLANT, BILATERAL     CHOLECYSTECTOMY OPEN  1973   W/  APPENDECTOMY   CORONARY ANGIOPLASTY WITH STENT PLACEMENT  04-04-2009  dr Smitty Cords brodie   PCI and DES x1 to  mRCA/  mLAD 40%, pCX 30%, pRCA 30%/  normal LVF   FLEXIBLE SIGMOIDOSCOPY N/A 07/07/2013   Procedure: FLEXIBLE SIGMOIDOSCOPY;  Surgeon: Vertell Novak., MD;  Location: [facility];  Service: Endoscopy;  Laterality: N/A;  unprepped   LEFT HEART CATH AND CORONARY ANGIOGRAPHY N/A 10/26/2019   Procedure: LEFT HEART CATH AND CORONARY ANGIOGRAPHY;  Surgeon: Swaziland, Peter M, MD;  Location: [facility] INVASIVE CV LAB;  Service: Cardiovascular;  Laterality: N/A;   LEFT HEART  CATHETERIZATION WITH CORONARY ANGIOGRAM N/A 05/09/2012   Procedure: LEFT HEART CATHETERIZATION WITH CORONARY ANGIOGRAM;  Surgeon: Herby Abraham, MD;  Location: [facility] CATH LAB;  Service: Cardiovascular;  Laterality: N/A;   MOHS SURGERY  2009   scalp   PILONIDAL CYST EXCISION  2009;   2000;   1999   RECTAL ULTRASOUND N/A 01/11/2014   Procedure: RECTAL ULTRASOUND;  Surgeon: Romie Levee, MD;  Location: [facility];  Service: Endoscopy;  Laterality: N/A;   TOTAL KNEE ARTHROPLASTY Right 09/07/2014   Procedure: RIGHT TOTAL KNEE ARTHROPLASTY;  Surgeon: Durene Romans, MD;  Location: [facility] ORS;  Service: Orthopedics;  Laterality: Right;   TOTAL KNEE ARTHROPLASTY Left 12/20/2015   Procedure: LEFT TOTAL KNEE ARTHROPLASTY;  Surgeon: Durene Romans, MD;  Location: [facility] ORS;  Service: Orthopedics;  Laterality: Left;   TRANSTHORACIC ECHOCARDIOGRAM  04-03-2017   dr allred   Massie Maroon 55-60%/  mild MVP anterior leaflet (valve area 2.1cm^2) with mild regurg. , no stenosis/  trivial TR and PR   VAGINAL HYSTERECTOMY  1975   W/ BILATERAL SALPINOOPHOORECTOMY   VAGUS  NERVE STIMULATOR INSERTION N/A 03/10/2014   Procedure: IMPLANTATION OF SACRAL  NERVE STIMULATOR ;  Surgeon: Romie Levee, MD;  Location: [facility];  Service: General;  Laterality: N/A;  Sacral. [employer]    Allergies  Allergen Reactions   Cephalexin Nausea Only    Outpatient Encounter Medications as of 08/01/2023  Medication Sig   dabigatran (PRADAXA) 150 MG CAPS capsule Take 1 capsule (150 mg total) by mouth 2 (two) times daily.   hydrochlorothiazide (MICROZIDE) 12.5 MG capsule TAKE 1 CAPSULE DAILY   lisinopril (PRINIVIL,ZESTRIL) 10 MG tablet Take 1 tablet (10 mg total) by mouth daily.   traZODone (DESYREL) 50 MG tablet Take 3 tablets (150 mg total) by mouth at bedtime.   [DISCONTINUED] atorvastatin (LIPITOR) 20 MG tablet Take 1 tablet (20 mg total) by mouth daily. (Patient not taking: Reported on 08/01/2023)   [DISCONTINUED] docusate sodium  (COLACE) 100 MG capsule Take 1 capsule (100 mg total) by mouth 2 (two) times daily. (Patient not taking: Reported on 08/01/2023)   [DISCONTINUED] metoprolol succinate (TOPROL-XL) 25 MG 24 hr tablet Take 25 mg by mouth daily. (Patient not taking: Reported on 08/01/2023)   [DISCONTINUED] pantoprazole (PROTONIX) 40 MG tablet Take 1 tablet (40 mg total) by mouth at bedtime. (Patient not taking: Reported on 08/01/2023)   No facility-administered encounter medications on file as of 08/01/2023.    Review of Systems  Constitutional:  Negative for appetite change, chills, fatigue, fever and unexpected weight change.  HENT:  Negative for congestion, ear discharge, ear pain, facial swelling, hearing loss, nosebleeds, postnasal drip, rhinorrhea, sinus pressure, sinus pain, sneezing, sore throat, tinnitus and trouble swallowing.   Eyes:  Negative for pain, discharge, redness, itching and visual disturbance.  Respiratory:  Negative for cough, chest tightness, shortness of breath and wheezing.   Cardiovascular:  Negative for chest pain, palpitations and leg swelling.  Gastrointestinal:  Negative for abdominal distention, abdominal pain, blood in stool, constipation, diarrhea, nausea and vomiting.  Genitourinary:  Negative for difficulty urinating, dysuria, flank pain, frequency and urgency.  Musculoskeletal:  Negative for arthralgias, back pain, gait problem, joint swelling, myalgias, neck pain and neck stiffness.  Skin:  Negative for color change, pallor, rash and wound.  Neurological:  Negative for dizziness, syncope, speech difficulty, weakness, light-headedness, numbness and headaches.  Hematological:  Does not bruise/bleed easily.  Psychiatric/Behavioral:  Negative for agitation, behavioral problems, confusion, hallucinations and sleep disturbance. The patient is not nervous/anxious.     Immunization History  Administered Date(s) Administered   Fluad Trivalent(High Dose 65+) 03/01/2023   Influenza Split  03/27/2012, 04/08/2013, 05/04/2014   Influenza, High Dose Seasonal PF 04/01/2017, 03/28/2018   Influenza,inj,Quad PF,6-35 Mos 04/17/2015   Influenza-Unspecified 03/27/2012, 04/08/2013, 05/04/2014, 04/17/2015, 04/01/2017, 03/22/2022   PFIZER(Purple Top)SARS-COV-2 Vaccination 07/23/2019, 08/13/2019   Pneumococcal Conjugate-13 09/30/2017   Pneumococcal Polysaccharide-23 04/27/2010   Unspecified SARS-COV-2 Vaccination 03/22/2022   Zoster Recombinant(Shingrix) 04/12/2017   Zoster, Live 05/11/2010   Pertinent  Health Maintenance Due  Topic Date Due   DEXA SCAN  Never done   OPHTHALMOLOGY EXAM  04/03/2023   HEMOGLOBIN A1C  10/17/2023   FOOT EXAM  04/17/2024   INFLUENZA VACCINE  Completed   Colonoscopy  Discontinued      02/01/2023    1:08 PM 04/02/2023    8:49 AM 05/23/2023    1:19 PM 06/11/2023   11:04 AM 08/01/2023    9:45 AM  Fall Risk  Falls in the past year? 0 0 0 0 0  Was there an injury with  Fall? 1 0 0 0 0  Fall Risk Category Calculator 1 0 0 0 0  Patient at Risk for Falls Due to No Fall Risks No Fall Risks No Fall Risks No Fall Risks   Fall risk Follow up Falls evaluation completed Falls evaluation completed;Education provided;Falls prevention discussed Falls evaluation completed;Education provided;Falls prevention discussed Falls evaluation completed;Education provided;Falls prevention discussed    Functional Status Survey:    Vitals:   08/01/23 0948  BP: 124/72  Pulse: 91  Resp: 20  Temp: 97.8 F (36.6 C)  SpO2: 97%  Weight: 164 lb 9.6 oz (74.7 kg)  Height: 5\' 2"  (1.575 m)   Body mass index is 30.11 kg/m. Physical Exam Vitals reviewed.  Constitutional:      General: She is not in acute distress.    Appearance: Normal appearance. She is obese. She is not ill-appearing or diaphoretic.  HENT:     Head: Normocephalic.     Right Ear: Tympanic membrane, ear canal and external ear normal. There is no impacted cerumen.     Left Ear: Tympanic membrane, ear canal and  external ear normal. There is no impacted cerumen.     Nose: Nose normal. No congestion or rhinorrhea.     Mouth/Throat:     Mouth: Mucous membranes are moist.     Pharynx: Oropharynx is clear. No oropharyngeal exudate or posterior oropharyngeal erythema.  Eyes:     General: No scleral icterus.       Right eye: No discharge.        Left eye: No discharge.     Extraocular Movements: Extraocular movements intact.     Conjunctiva/sclera: Conjunctivae normal.     Pupils: Pupils are equal, round, and reactive to light.  Neck:     Vascular: No carotid bruit.  Cardiovascular:     Rate and Rhythm: Normal rate and regular rhythm.     Pulses: Normal pulses.     Heart sounds: Normal heart sounds. No murmur heard.    No friction rub. No gallop.  Pulmonary:     Effort: Pulmonary effort is normal. No respiratory distress.     Breath sounds: Normal breath sounds. No wheezing, rhonchi or rales.  Chest:     Chest wall: No tenderness.  Abdominal:     General: Bowel sounds are normal. There is no distension.     Palpations: Abdomen is soft. There is no mass.     Tenderness: There is no abdominal tenderness. There is no right CVA tenderness, left CVA tenderness, guarding or rebound.  Musculoskeletal:        General: No swelling or tenderness. Normal range of motion.     Cervical back: Normal range of motion. No rigidity or tenderness.     Right lower leg: No edema.     Left lower leg: No edema.  Lymphadenopathy:     Cervical: No cervical adenopathy.  Skin:    General: Skin is warm and dry.     Coloration: Skin is not pale.     Findings: No bruising, erythema, lesion or rash.  Neurological:     Mental Status: She is alert and oriented to person, place, and time.     Cranial Nerves: No cranial nerve deficit.     Sensory: No sensory deficit.     Motor: No weakness.     Coordination: Coordination normal.     Gait: Gait normal.  Psychiatric:        Mood and Affect: Mood normal. Affect is angry.  Speech: Speech normal.        Behavior: Behavior is agitated.        Thought Content: Thought content normal.     Labs reviewed: Recent Labs    11/30/22 2319 11/30/22 2327 02/01/23 1546 04/18/23 1058  NA 136 138 139 138  K 3.0* 3.1* 3.4* 3.6  CL 104 102 100 99  CO2 20*  --  29 28  GLUCOSE 95 92 120 81  BUN 28* 27* 18 27*  CREATININE 1.43* 1.70* 1.38* 1.21*  CALCIUM 8.7*  --  9.8 9.3   Recent Labs    08/17/22 0000 11/30/22 2319 02/01/23 1546  AST 27 22 15   ALT 24 23 10   ALKPHOS 91 40  --   BILITOT  --  0.2* 0.4  PROT  --  5.9* 6.8  ALBUMIN 4.1 3.9  --    Recent Labs    10/19/22 1502 11/30/22 2319 11/30/22 2327 02/01/23 1546 06/11/23 1123  WBC 5.8 4.2  --  6.6 4.5  NEUTROABS 3.3 2.4  --  4,224  --   HGB 10.5* 11.5* 11.6* 14.3 12.4  HCT 32.4* 36.0 34.0* 40.9 36.2  MCV 84.8 94.0  --  96.9 100.3*  PLT 218.0 147*  --  214 176   Lab Results  Component Value Date   TSH 1.65 08/17/2022   Lab Results  Component Value Date   HGBA1C 5.7 (H) 04/18/2023   Lab Results  Component Value Date   CHOL 182 04/18/2023   HDL 47 (L) 04/18/2023   LDLCALC 101 (H) 04/18/2023   TRIG 217 (H) 04/18/2023   CHOLHDL 3.9 04/18/2023    Significant Diagnostic Results in last 30 days:  No results found.  Assessment/Plan 1. Primary hypertension (Primary) B/p well controlled  - continue on Hydrochlorothiazide and Lisinopril  - not taking Metoprolol   2. Paroxysmal atrial fibrillation (HCC) HR well controlled  - continue on Pradaxa  - continue to follow up with Cardiologist  - Not taking Metoprolol   3. Mixed hyperlipidemia - continue on atorvastatin  - dietary modification and exercise advised   4. Gastroesophageal reflux disease without esophagitis - continue on Protonix   5. Primary insomnia Trazodone effective    Family/ staff Communication: Reviewed plan of care with patient verbalized understanding.Office manage Gerton notified of patient's agitation  and anger and medication concerns.Manager went in to address patient's concerned.   Labs/tests ordered: Has upcoming lab appointment 08/22/2023   Next Appointment: Has appointment with PCP   Caesar Bookman, NP

## 2023-08-07 ENCOUNTER — Other Ambulatory Visit: Payer: Self-pay | Admitting: Emergency Medicine

## 2023-08-07 DIAGNOSIS — I480 Paroxysmal atrial fibrillation: Secondary | ICD-10-CM

## 2023-08-07 MED ORDER — DABIGATRAN ETEXILATE MESYLATE 150 MG PO CAPS: 150.0000 mg | ORAL_CAPSULE | Freq: Two times a day (BID) | ORAL | 1 refills | Status: DC

## 2023-08-22 ENCOUNTER — Other Ambulatory Visit: Payer: Medicare Other

## 2023-08-22 ENCOUNTER — Encounter: Payer: Medicare Other | Admitting: Adult Health

## 2023-09-04 ENCOUNTER — Other Ambulatory Visit: Payer: Self-pay | Admitting: Adult Health

## 2023-09-04 DIAGNOSIS — R251 Tremor, unspecified: Secondary | ICD-10-CM

## 2023-09-16 ENCOUNTER — Ambulatory Visit (INDEPENDENT_AMBULATORY_CARE_PROVIDER_SITE_OTHER): Admitting: Adult Health

## 2023-09-16 ENCOUNTER — Encounter: Payer: Self-pay | Admitting: Adult Health

## 2023-09-16 ENCOUNTER — Telehealth: Payer: Self-pay

## 2023-09-16 VITALS — BP 124/75 | HR 99 | Temp 97.5°F | Resp 18 | Ht 62.0 in | Wt 162.2 lb

## 2023-09-16 DIAGNOSIS — J101 Influenza due to other identified influenza virus with other respiratory manifestations: Secondary | ICD-10-CM | POA: Diagnosis not present

## 2023-09-16 DIAGNOSIS — R051 Acute cough: Secondary | ICD-10-CM

## 2023-09-16 DIAGNOSIS — I10 Essential (primary) hypertension: Secondary | ICD-10-CM | POA: Diagnosis not present

## 2023-09-16 DIAGNOSIS — I480 Paroxysmal atrial fibrillation: Secondary | ICD-10-CM

## 2023-09-16 LAB — POCT INFLUENZA A/B
Influenza A, POC: POSITIVE — AB
Influenza B, POC: NEGATIVE

## 2023-09-16 LAB — POC COVID19 BINAXNOW: SARS Coronavirus 2 Ag: NEGATIVE

## 2023-09-16 MED ORDER — ALBUTEROL SULFATE HFA 108 (90 BASE) MCG/ACT IN AERS: 2.0000 | INHALATION_SPRAY | Freq: Four times a day (QID) | RESPIRATORY_TRACT | 0 refills | Status: AC | PRN

## 2023-09-16 MED ORDER — OSELTAMIVIR PHOSPHATE 75 MG PO CAPS: 75.0000 mg | ORAL_CAPSULE | Freq: Every day | ORAL | 0 refills | Status: DC

## 2023-09-16 MED ORDER — OSELTAMIVIR PHOSPHATE 30 MG PO CAPS: 30.0000 mg | ORAL_CAPSULE | Freq: Two times a day (BID) | ORAL | 0 refills | Status: DC

## 2023-09-16 MED ORDER — OSELTAMIVIR PHOSPHATE 45 MG PO CAPS: 45.0000 mg | ORAL_CAPSULE | Freq: Every day | ORAL | 0 refills | Status: DC

## 2023-09-16 MED ORDER — BENZONATATE 100 MG PO CAPS: 100.0000 mg | ORAL_CAPSULE | Freq: Three times a day (TID) | ORAL | 0 refills | Status: DC | PRN

## 2023-09-16 MED ORDER — OSELTAMIVIR PHOSPHATE 45 MG PO CAPS
45.0000 mg | ORAL_CAPSULE | Freq: Every day | ORAL | 0 refills | Status: DC
Start: 2023-09-17 — End: 2023-09-22

## 2023-09-16 NOTE — Telephone Encounter (Signed)
 Pharmacy left voicemail stating that they only have 45mg  and patient states that they're NOT going to another pharmacy. Message routed back to PCP Medina-Vargas, Margit Banda, NP

## 2023-09-16 NOTE — Telephone Encounter (Signed)
 Pharmacy called about patient stating that they dont have 30mg  of Tamiflu. They do have 45mg . Message routed to PCP Medina-Vargas, Margit Banda, NP

## 2023-09-16 NOTE — Telephone Encounter (Signed)
 Copied from CRM [id number]. Topic: Clinical - Medication Question >> Sep 16, 2023  2:26 PM Suzette B wrote: Reason for CRM: Qwin from [facility] pharmacy call in regards to Tamiflu which was prescribed this morning for the patient, she stated that they have the 75mg  of Tamiflu, but not the 40mg . Called the CAL to speak with someone and was transferred over to the Clinical line where I left a message to have someone call Qwin. She also stated the spouse was there to pick up the medication and needed to know what to do. Attempted to call the Triage line to get some assistance however, my phones went down.

## 2023-09-16 NOTE — Telephone Encounter (Signed)
 Message routed to PCP Medina-Vargas, Monina C, NP as FYI.

## 2023-09-16 NOTE — Addendum Note (Signed)
 Addended by: Kenard Gower C on: 09/16/2023 03:27 PM   Modules accepted: Orders

## 2023-09-16 NOTE — Telephone Encounter (Signed)
 Since Oseltamivir 75 mg and 30 mg is not available  at their pharmacy,  patient stated that they will not got to another pharmacy, will give eRx of Oseltamivir 45 mg daily X 5 days.

## 2023-09-16 NOTE — Telephone Encounter (Signed)
 MyChart message sent to patient. I also called son [name] as well to limit any confusion.

## 2023-09-16 NOTE — Telephone Encounter (Signed)
Message routed to PCP Medina-Vargas, Monina C, NP  

## 2023-09-16 NOTE — Patient Instructions (Signed)

## 2023-09-16 NOTE — Telephone Encounter (Signed)
 Pls take the Oseltamivir 75 mg daily today only, then on 09/17/23 (Tuesday) start Oseltamivir 45 mg daily X 4 days.

## 2023-09-16 NOTE — Telephone Encounter (Signed)
 Verbal orders from PCP Medina-Vargas, Monina C, NP patient is to take the 45mg  for 5 days. I called patient son back and notified him.

## 2023-09-16 NOTE — Progress Notes (Addendum)
 [facility] clinic  Provider:  Kenard Gower DNP  Code Status:  Full Code  Goals of Care:     09/16/2023    1:29 PM  Advanced Directives  Does Patient Have a Medical Advance Directive? Yes  Type of Estate agent of [place];Out of facility DNR (pink MOST or yellow form);Living will  Does patient want to make changes to medical advance directive? No - Patient declined  Copy of Healthcare Power of Attorney in Chart? Yes - validated most recent copy scanned in chart (See row information)     Chief Complaint  Patient presents with   Acute Visit    Patient Cold/Headache /congestion    Discussed the use of AI scribe software for clinical note transcription with the patient, who gave verbal consent to proceed.  HPI: Patient is a 78 y.o. female seen today for an acute visit for cold/headache/congestion. She was accompanied by her husband.   [name] is a 78 year old female with hypertension and atrial fibrillation who presents with cough and fever. She developed a dry cough and fever on Saturday night.  She experienced fever last night, though she is unsure of the exact temperature, and mentions feeling hot, especially in her legs, along with chills at night. No medication was taken for the cough prior to this visit. She reports body pains 'all over', feeling 'terrible', and no known exposure to sick individuals, stating 'just me'.  She mentions having diarrhea and a reduced appetite, although she is trying to eat and drink as much as possible. She regularly drinks a lot of water and tries to eat healthily. She notes that her stomach and ribs are sore from coughing.  She has a history of hypertension, for which she takes hydrochlorothiazide 12.5 mg daily and lisinopril 10 mg daily. She also has a history of atrial fibrillation and takes Pradaxa for this condition. She reports occasional shortness of breath and wheezing but has not been using any medication for  these symptoms recently. She has a history of using inhalation therapy a long time ago and is familiar with its use.  She frequently eats outside and her granddaughter works in a hospital as a Engineer, civil (consulting). Today was the first day she saw her granddaughter.    Today, she was tested  for COVID-19 and Influenza A/B. She tested positive for Influenza A.  Past Medical History:  Diagnosis Date   Anticoagulated    Pradaxa   Bilateral leg pain    Right leg has greater pain than the left.   Coronary artery disease cardiologist-- dr Johney Frame   a.  s/p Xience DES to RCA 04/2009;   b. TEE 2/12: EF 40%, Large PFO;  c.  Lexiscan Myoview 05/2012: EF 69%, no ischemia. LHC (05/2012):  Ostial diagonal 30-40%, proximal mid ramus intermedius 40-50%, RCA stent patent with 40-50% after stent, then 40%, distal RCA 40-50%, EF 55-65%. Medical therapy continued.;  d.  Eugenie Birks Myoview (06/2013):  No ischemia, EF 83%, normal study   Full dentures    GERD (gastroesophageal reflux disease)    History of basal cell carcinoma excision    scalp   History of hiatal hernia    History of kidney stones 51 years ago   x1   History of recurrent UTIs    Hypertension    IFG (impaired fasting glucose)    MVP (mitral valve prolapse)    mild per last echo 04-03-2017 in epic   OA (osteoarthritis)    knees ,  fingers   PAF (paroxysmal atrial fibrillation) [facility])    cardiologist-- dr Johney Frame   S/P ablation of atrial fibrillation 08/15/2010   S/P drug eluting coronary stent placement 04/04/2009   Spinal stenosis    Tremor    Wears glasses     Past Surgical History:  Procedure Laterality Date   ABDOMINAL EXPOSURE N/A 05/11/2020   Procedure: ABDOMINAL EXPOSURE;  Surgeon: Larina Earthly, MD;  Location: [facility] OR;  Service: Vascular;  Laterality: N/A;   ANAL RECTAL MANOMETRY N/A 01/11/2014   Procedure: ANAL RECTAL MANOMETRY;  Surgeon: Romie Levee, MD;  Location: [facility] ENDOSCOPY;  Service: Endoscopy;  Laterality: N/A;   APPENDECTOMY  1973    BACK SURGERY     laminectomy times 2; 2000 and 2001   BUNIONECTOMY Bilateral 2013   CARDIAC CATHETERIZATION  10-24-2010   dr allred   singl-vessel CAD with patent stent mRCA/  moderate disease mRCA beyond stent segment/  normal lvsf   CARDIAC ELECTROPHYSIOLOGY MAPPING AND ABLATION  08-15-2010  dr allred   CARDIOVASCULAR STRESS TEST  06-15-2013  dr allred   normal perfusion study/  no ischemia/  ef 83%   CARPAL TUNNEL RELEASE Right 25 years ago   CATARACT EXTRACTION W/ INTRAOCULAR LENS  IMPLANT, BILATERAL     CHOLECYSTECTOMY OPEN  1973   W/  APPENDECTOMY   CORONARY ANGIOPLASTY WITH STENT PLACEMENT  04-04-2009  dr Smitty Cords brodie   PCI and DES x1 to  mRCA/  mLAD 40%, pCX 30%, pRCA 30%/  normal LVF   FLEXIBLE SIGMOIDOSCOPY N/A 07/07/2013   Procedure: FLEXIBLE SIGMOIDOSCOPY;  Surgeon: Vertell Novak., MD;  Location: [facility] ENDOSCOPY;  Service: Endoscopy;  Laterality: N/A;  unprepped   LEFT HEART CATH AND CORONARY ANGIOGRAPHY N/A 10/26/2019   Procedure: LEFT HEART CATH AND CORONARY ANGIOGRAPHY;  Surgeon: Swaziland, Peter M, MD;  Location: [facility] INVASIVE CV LAB;  Service: Cardiovascular;  Laterality: N/A;   LEFT HEART CATHETERIZATION WITH CORONARY ANGIOGRAM N/A 05/09/2012   Procedure: LEFT HEART CATHETERIZATION WITH CORONARY ANGIOGRAM;  Surgeon: Herby Abraham, MD;  Location: [facility] CATH LAB;  Service: Cardiovascular;  Laterality: N/A;   MOHS SURGERY  2009   scalp   PILONIDAL CYST EXCISION  2009;   2000;   1999   RECTAL ULTRASOUND N/A 01/11/2014   Procedure: RECTAL ULTRASOUND;  Surgeon: Romie Levee, MD;  Location: [facility] ENDOS[facility];  Service: Endoscopy;  Laterality: N/A;   TOTAL KNEE ARTHROPLASTY Right 09/07/2014   Procedure: RIGHT TOTAL KNEE ARTHROPLASTY;  Surgeon: Durene Romans, MD;  Location: [facility] ORS;  Service: Orthopedics;  Laterality: Right;   TOTAL KNEE ARTHROPLASTY Left 12/20/2015   Procedure: LEFT TOTAL KNEE ARTHROPLASTY;  Surgeon: Durene Romans, MD;  Location: [facility] ORS;  Service: Orthopedics;  Laterality: Left;    TRANSTHORACIC ECHOCARDIOGRAM  04-03-2017   dr allred   Massie Maroon 55-60%/  mild MVP anterior leaflet (valve area 2.1cm^2) with mild regurg. , no stenosis/  trivial TR and PR   VAGINAL HYSTERECTOMY  1975   W/ BILATERAL SALPINOOPHOORECTOMY   VAGUS NERVE STIMULATOR INSERTION N/A 03/10/2014   Procedure: IMPLANTATION OF SACRAL  NERVE STIMULATOR ;  Surgeon: Romie Levee, MD;  Location: [facility];  Service: General;  Laterality: N/A;  Sacral. [employer]    Allergies  Allergen Reactions   Cephalexin Nausea Only    Outpatient Encounter Medications as of 09/16/2023  Medication Sig   albuterol (VENTOLIN HFA) 108 (90 Base) MCG/ACT inhaler Inhale 2 puffs into the lungs every 6 (six) hours as needed for wheezing  or shortness of breath.   benzonatate (TESSALON PERLES) 100 MG capsule Take 1 capsule (100 mg total) by mouth 3 (three) times daily as needed.   dabigatran (PRADAXA) 150 MG CAPS capsule Take 1 capsule (150 mg total) by mouth 2 (two) times daily.   hydrochlorothiazide (MICROZIDE) 12.5 MG capsule TAKE 1 CAPSULE DAILY   lisinopril (PRINIVIL,ZESTRIL) 10 MG tablet Take 1 tablet (10 mg total) by mouth daily.   [START ON 09/17/2023] oseltamivir (TAMIFLU) 45 MG capsule Take 1 capsule (45 mg total) by mouth daily for 4 days.   oseltamivir (TAMIFLU) 75 MG capsule Take 1 capsule (75 mg total) by mouth daily for 1 day.   primidone (MYSOLINE) 50 MG tablet TAKE 1 TABLET TWICE A DAY   traZODone (DESYREL) 50 MG tablet Take 3 tablets (150 mg total) by mouth at bedtime.   [DISCONTINUED] oseltamivir (TAMIFLU) 30 MG capsule Take 1 capsule (30 mg total) by mouth 2 (two) times daily for 4 days.   No facility-administered encounter medications on file as of 09/16/2023.    Review of Systems:  Review of Systems  Constitutional:  Positive for fever. Negative for appetite change, chills and fatigue.  HENT:  Positive for sore throat. Negative for congestion, hearing loss and rhinorrhea.   Eyes: Negative.    Respiratory:  Positive for cough. Negative for shortness of breath and wheezing.   Cardiovascular:  Negative for chest pain, palpitations and leg swelling.  Gastrointestinal:  Negative for abdominal pain, constipation, diarrhea, nausea and vomiting.  Genitourinary:  Negative for dysuria.  Musculoskeletal:  Negative for arthralgias, back pain and myalgias.  Skin:  Negative for color change, rash and wound.  Neurological:  Negative for dizziness, weakness and headaches.  Psychiatric/Behavioral:  Negative for behavioral problems. The patient is not nervous/anxious.     Health Maintenance  Topic Date Due   Diabetic kidney evaluation - Urine ACR  Never done   DTaP/Tdap/Td (1 - Tdap) Never done   DEXA SCAN  Never done   Zoster Vaccines- Shingrix (2 of 2) 06/07/2017   Medicare Annual Wellness (AWV)  04/01/2018   COVID-19 Vaccine (4 - 2024-25 season) 03/03/2023   OPHTHALMOLOGY EXAM  04/03/2023   HEMOGLOBIN A1C  10/17/2023   Diabetic kidney evaluation - eGFR measurement  04/17/2024   FOOT EXAM  04/17/2024   Pneumonia Vaccine 100+ Years old  Completed   INFLUENZA VACCINE  Completed   Hepatitis C Screening  Completed   HPV VACCINES  Aged Out   Colonoscopy  Discontinued    Physical Exam: Vitals:   09/16/23 1329  BP: 124/75  Pulse: 99  Resp: 18  Temp: (!) 97.5 F (36.4 C)  SpO2: 99%  Weight: 162 lb 3.2 oz (73.6 kg)  Height: 5\' 2"  (1.575 m)   Body mass index is 29.67 kg/m. Physical Exam Constitutional:      Appearance: Normal appearance.  HENT:     Head: Normocephalic and atraumatic.     Nose: Nose normal.     Mouth/Throat:     Mouth: Mucous membranes are moist.  Eyes:     Conjunctiva/sclera: Conjunctivae normal.  Cardiovascular:     Rate and Rhythm: Normal rate and regular rhythm.  Pulmonary:     Effort: Pulmonary effort is normal.     Breath sounds: Normal breath sounds.  Abdominal:     General: Bowel sounds are normal.     Palpations: Abdomen is soft.   Musculoskeletal:        General: Normal range of motion.  Cervical back: Normal range of motion.  Skin:    General: Skin is warm and dry.  Neurological:     General: No focal deficit present.     Mental Status: She is alert and oriented to person, place, and time.  Psychiatric:        Mood and Affect: Mood normal.        Behavior: Behavior normal.        Thought Content: Thought content normal.        Judgment: Judgment normal.     Labs reviewed: Basic Metabolic Panel: Recent Labs    11/30/22 2319 11/30/22 2327 02/01/23 1546 04/18/23 1058  NA 136 138 139 138  K 3.0* 3.1* 3.4* 3.6  CL 104 102 100 99  CO2 20*  --  29 28  GLUCOSE 95 92 120 81  BUN 28* 27* 18 27*  CREATININE 1.43* 1.70* 1.38* 1.21*  CALCIUM 8.7*  --  9.8 9.3   Liver Function Tests: Recent Labs    11/30/22 2319 02/01/23 1546  AST 22 15  ALT 23 10  ALKPHOS 40  --   BILITOT 0.2* 0.4  PROT 5.9* 6.8  ALBUMIN 3.9  --    Recent Labs    11/30/22 2319  LIPASE 44   No results for input(s): "AMMONIA" in the last 8760 hours. CBC: Recent Labs    10/19/22 1502 11/30/22 2319 11/30/22 2327 02/01/23 1546 06/11/23 1123  WBC 5.8 4.2  --  6.6 4.5  NEUTROABS 3.3 2.4  --  4,224  --   HGB 10.5* 11.5* 11.6* 14.3 12.4  HCT 32.4* 36.0 34.0* 40.9 36.2  MCV 84.8 94.0  --  96.9 100.3*  PLT 218.0 147*  --  214 176   Lipid Panel: Recent Labs    04/18/23 1058  CHOL 182  HDL 47*  LDLCALC 101*  TRIG 217*  CHOLHDL 3.9   Lab Results  Component Value Date   HGBA1C 5.7 (H) 04/18/2023    Procedures since last visit: No results found.  Assessment/Plan  1. Influenza A (Primary) -  Positive test for Influenza A. Risk of pneumonia if not managed. Oseltamivir prescribed with adjusted dosing for reduced kidney function. - Prescribe oseltamivir 75 mg once today, then 30 mg twice daily for four days. -  Pharmacy sent message that they don't have Oseltamivir 75 mg and 30 mg but have 45 mg so will give  Oseltamivir  45 mg daily X 5 days. -  will start on Albuterol HFI PRN for SOB/wheezing - Advise gargling with warm salt water three times a day. - Encourage increased fluid intake and healthy diet. - Advise sleeping in a separate bed to prevent transmission. - oseltamivir (TAMIFLU) 75 MG capsule; Take 1 capsule (75 mg total) by mouth daily for 1 day.  Dispense: 1 capsule; Refill: 0 - albuterol (VENTOLIN HFA) 108 (90 Base) MCG/ACT inhaler; Inhale 2 puffs into the lungs every 6 (six) hours as needed for wheezing or shortness of breath.  Dispense: 8 g; Refill: 0 - oseltamivir (TAMIFLU) 45 MG capsule; Take 1 capsule (45 mg total) by mouth daily for 5 days.  Dispense: 5 capsule; Refill: 0  2. Acute cough - POC COVID-19 - POC Influenza A/B -  Persistent dry cough causing discomfort. Tessalon Perles prescribed for relief. - benzonatate (TESSALON PERLES) 100 MG capsule; Take 1 capsule (100 mg total) by mouth 3 (three) times daily as needed.  Dispense: 20 capsule; Refill: 0  3. Essential hypertension -  Blood  pressure well-controlled with current medication regimen. -  continue Lisinopril and HCTZ  4. PAF (paroxysmal atrial fibrillation) (HCC) -  rate-controlled -  On Pradaxa for anticoagulation    General Health Maintenance Advised on infection prevention due to exposure risks. - Advise wearing a mask and practicing good hygiene.      Labs/tests ordered:   COVID-19 and Influenza A/B test   Return if symptoms worsen or fail to improve.  Kenard Gower, NP

## 2023-09-17 ENCOUNTER — Ambulatory Visit: Payer: Self-pay | Admitting: Adult Health

## 2023-09-17 NOTE — Telephone Encounter (Signed)
 Per Medina-Vargas, Monina C, NP response:  Pls follow up in office if diarrhea persists for further evaluation.   Left message on voicemail for patient to return call when available

## 2023-09-17 NOTE — Telephone Encounter (Signed)
 Patient was diagnosed with Influenza A yesterday and was prescribed Oseltamivir, Tessalon and albuterol PRN. It was explained during the visit what the medication is for. Patient may take Loperamide 2 mg 4X/day PRN for the diarrhea. Pls continue to drink plenty of fluids for hydration. Pls follow up if diarrhea does not resolve.

## 2023-09-17 NOTE — Telephone Encounter (Signed)
 1st attempt. Auto recording received: This caller is not available  Message from [place] H sent at 09/17/2023 11:10 AM EDT  Summary: diarrhea   Copied From CRM [id number]. Reason for Triage: Patient says she has been experiencing diarrhea since taking the albuterol medication she was prescribed yesterday.      Reason for Disposition  [1] Influenza diagnosed by doctor (or NP/PA) AND [2] no complications  Protocols used: Influenza (Flu) Follow-up Call-A-AH

## 2023-09-17 NOTE — Telephone Encounter (Signed)
 Pls follow up in office if diarrhea persists for further evaluation.

## 2023-09-17 NOTE — Telephone Encounter (Signed)
 Copied from CRM [id number]. Topic: Clinical - Pink Word Triage >> Sep 17, 2023  3:25 PM Corin V wrote: Patient calling back again about diahrea. She took 11 imodium yesterday.  Patient called back requesting recommendations regarding her diarrhea. She mentioned that she had 11 episodes of diarrhea in the past 24 hours. She has tried Imodium but it does not seem to be helping. This RN suggested other remedies such as Pepto Bismol, Kaopectate. Patient stated that her GI doctor told her she should not take those due to her IBS. When asked if she contacted her GI doctor regarding this, patient stated she did not contact her GI doctor because she thought she could trust her PCP. Informed patient that a message has been sent to PCP and we are awaiting the provider's response.

## 2023-09-17 NOTE — Telephone Encounter (Signed)
 Message from [place] H sent at 09/17/2023 11:10 AM EDT  Summary: diarrhea   Copied From CRM [id number]. Reason for Triage: Patient says she has been experiencing diarrhea since taking the albuterol medication she was prescribed yesterday.   Chief Complaint: Diarrhea  Symptoms: Diarrhea  Frequency: Frequent  Pertinent Negatives: Patient denies fever, abdominal pain, vomiting  Disposition: [] ED /[] Urgent Care (no appt availability in office) / [x] Appointment(In office/virtual)/ []  [place] [facility]/ [] Home Care/ [] Refused Recommended Disposition /[] [facility] Mobile [facility]/ []  Follow-up with PCP Additional Notes: Patient reports a history of IBS and states she was seen yesterday for diarrhea and was prescribed Tessalon and Albuterol for influenza. Patient states that it was not explained to her that she had influenza and that the medications she was given are causing diarrhea. Patient denies any cough or difficulty breathing. I advised the patient that if she is not having symptoms she does not need to take the medications as they are as needed. Patient states that she did not feel as if the provider listened to her and did not take into account her IBS. Patient states she will call her GI specialist to make an appointment or for advice for her symptoms.     1. DIARRHEA SEVERITY: "How bad is the diarrhea?" "How many more stools have you had in the past 24 hours than normal?"    - NO DIARRHEA (SCALE 0)   - MILD (SCALE 1-3): Few loose or mushy BMs; increase of 1-3 stools over normal daily number of stools; mild increase in ostomy output.   -  MODERATE (SCALE 4-7): Increase of 4-6 stools daily over normal; moderate increase in ostomy output.   -  SEVERE (SCALE 8-10; OR "WORST POSSIBLE"): Increase of 7 or more stools daily over normal; moderate increase in ostomy output; incontinence.     15 2. ONSET: "When did the diarrhea begin?"      Chronic, worsening  3. BM CONSISTENCY: "How loose or watery is  the diarrhea?"      Sometimes loose, sometimes watery  4. VOMITING: "Are you also vomiting?" If Yes, ask: "How many times in the past 24 hours?"      No 5. ABDOMEN PAIN: "Are you having any abdomen pain?" If Yes, ask: "What does it feel like?" (e.g., crampy, dull, intermittent, constant)      No 6. ABDOMEN PAIN SEVERITY: If present, ask: "How bad is the pain?"  (e.g., Scale 1-10; mild, moderate, or severe)   - MILD (1-3): doesn't interfere with normal activities, abdomen soft and not tender to touch    - MODERATE (4-7): interferes with normal activities or awakens from sleep, abdomen tender to touch    - SEVERE (8-10): excruciating pain, doubled over, unable to do any normal activities       0/10 7. ORAL INTAKE: If vomiting, "Have you been able to drink liquids?" "How much liquids have you had in the past 24 hours?"     Yes 8. HYDRATION: "Any signs of dehydration?" (e.g., dry mouth [not just dry lips], too weak to stand, dizziness, new weight loss) "When did you last urinate?"     No 9. EXPOSURE: "Have you traveled to a foreign country recently?" "Have you been exposed to anyone with diarrhea?" "Could you have eaten any food that was spoiled?"     No 10. ANTIBIOTIC USE: "Are you taking antibiotics now or have you taken antibiotics in the past 2 months?"       No 11. OTHER SYMPTOMS: "  Do you have any other symptoms?" (e.g., fever, blood in stool)       No

## 2023-09-17 NOTE — Telephone Encounter (Signed)
 Spoke with patient and that she stated not the Loperamide is not working because she has IBS and chronic diarrhea but does agree that she will continue to drink plenty of fluids for hydration. Please Advise  Message sent to Medina-Vargas, Margit Banda, NP

## 2023-09-17 NOTE — Telephone Encounter (Signed)
 Message from [place] H sent at 09/17/2023 11:10 AM EDT   Summary: diarrhea              Copied From CRM [id number]. Reason for Triage: Patient says she has been experiencing diarrhea since taking the albuterol medication she was prescribed yesterday.     Chief Complaint: Diarrhea  Symptoms: Diarrhea  Frequency: Frequent  Pertinent Negatives: Patient denies fever, abdominal pain, vomiting  Disposition: [] ED /[] Urgent Care (no appt availability in office) / [x] Appointment(In office/virtual)/ []  [facility]/ [] Home Care/ [] Refused Recommended Disposition /[]  Mobile Bus/ []  Follow-up with PCP Additional Notes: Patient reports a history of IBS and states she was seen yesterday for diarrhea and was prescribed Tessalon and Albuterol for influenza. Patient states that it was not explained to her that she had influenza and that the medications she was given are causing diarrhea. Patient denies any cough or difficulty breathing. I advised the patient that if she is not having symptoms she does not need to take the medications as they are as needed. Patient states that she did not feel as if the provider listened to her and did not take into account her IBS. Patient states she will call her GI specialist to make an appointment or for advice for her symptoms.      1. DIARRHEA SEVERITY: "How bad is the diarrhea?" "How many more stools have you had in the past 24 hours than normal?"    - NO DIARRHEA (SCALE 0)   - MILD (SCALE 1-3): Few loose or mushy BMs; increase of 1-3 stools over normal daily number of stools; mild increase in ostomy output.   -  MODERATE (SCALE 4-7): Increase of 4-6 stools daily over normal; moderate increase in ostomy output.   -  SEVERE (SCALE 8-10; OR "WORST POSSIBLE"): Increase of 7 or more stools daily over normal; moderate increase in ostomy output; incontinence.     15 2. ONSET: "When did the diarrhea begin?"      Chronic, worsening  3. BM CONSISTENCY: "How  loose or watery is the diarrhea?"      Sometimes loose, sometimes watery  4. VOMITING: "Are you also vomiting?" If Yes, ask: "How many times in the past 24 hours?"      No 5. ABDOMEN PAIN: "Are you having any abdomen pain?" If Yes, ask: "What does it feel like?" (e.g., crampy, dull, intermittent, constant)      No 6. ABDOMEN PAIN SEVERITY: If present, ask: "How bad is the pain?"  (e.g., Scale 1-10; mild, moderate, or severe)   - MILD (1-3): doesn't interfere with normal activities, abdomen soft and not tender to touch    - MODERATE (4-7): interferes with normal activities or awakens from sleep, abdomen tender to touch    - SEVERE (8-10): excruciating pain, doubled over, unable to do any normal activities       0/10 7. ORAL INTAKE: If vomiting, "Have you been able to drink liquids?" "How much liquids have you had in the past 24 hours?"     Yes 8. HYDRATION: "Any signs of dehydration?" (e.g., dry mouth [not just dry lips], too weak to stand, dizziness, new weight loss) "When did you last urinate?"     No 9. EXPOSURE: "Have you traveled to a foreign country recently?" "Have you been exposed to anyone with diarrhea?" "Could you have eaten any food that was spoiled?"     No 10. ANTIBIOTIC USE: "Are you taking antibiotics now or have you taken antibiotics  in the past 2 months?"       No 11. OTHER SYMPTOMS: "Do you have any other symptoms?" (e.g., fever, blood in stool)       No             Renee Maryland, RN  Registered Nurse   Telephone Encounter Signed   Creation Time: 09/17/2023 11:21 AM   Signed     1st attempt. Auto recording received: This caller is not available   Message from [place] H sent at 09/17/2023 11:10 AM EDT   Summary: diarrhea              Copied From CRM [id number]. Reason for Triage: Patient says she has been experiencing diarrhea since taking the albuterol medication she was prescribed yesterday.           Reason for Disposition  [1] Influenza diagnosed by  doctor (or NP/PA) AND [2] no complications  Protocols used: Influenza (Flu) Follow-up Call-A-AH         Please Advise. Message sent to Medina-Vargas, Margit Banda, NP

## 2023-09-19 ENCOUNTER — Ambulatory Visit: Payer: Self-pay | Admitting: Adult Health

## 2023-09-19 ENCOUNTER — Ambulatory Visit: Payer: Self-pay

## 2023-09-19 NOTE — Telephone Encounter (Signed)
 Son, Moreen Piggott, called to follow up request Mom/pt made early r/t her s/s and the need for medications due to her current medications not providing relief.  Son stated Mom is currently experiencing some dementia and he was not able to be present during the phone call therefore he wanted to follow up to see if she was able to perform the call successfully.  Son stated currently his father is in the hospital and hoping to be released soon: son and he sister are currently attempting/seeking to get some home health services for the parents as they fear they are not able to stay and care for themself at home alone.  I did check to see if any prescriptions had been written for pt and informed none at present time: informed son I would send a message to attempt to request a call back on status of medication or if an appt is needed at present time.

## 2023-09-19 NOTE — Telephone Encounter (Signed)
 Willa Frater, RN  Registered Nurse   Telephone Encounter Signed   Creation Time: 09/19/2023  3:36 PM   Signed     Son, Klaudia Beirne, called to follow up request Mom/pt made early r/t her s/s and the need for medications due to her current medications not providing relief.  Son stated Mom is currently experiencing some dementia and he was not able to be present during the phone call therefore he wanted to follow up to see if she was able to perform the call successfully.  Son stated currently his father is in the hospital and hoping to be released soon: son and he sister are currently attempting/seeking to get some home health services for the parents as they fear they are not able to stay and care for themself at home alone.  I did check to see if any prescriptions had been written for pt and informed none at present time: informed son I would send a message to attempt to request a call back on status of medication or if an appt is needed at present time.        Message routed to Medina-Vargas, Margit Banda, NP

## 2023-09-19 NOTE — Telephone Encounter (Signed)
 Ovid Curd, RN  [facility]29 minutes ago (10:38 AM)    Lorain Childes, pt declined appt, requests recs/meds   [place], [place] [phone]  Ovid Curd, RN41 minutes ago (10:26 AM)   Ovid Curd, RN41 minutes ago (10:25 AM)    Chief Complaint: SOB Symptoms: diarrhea, cough, congestion Frequency: Ongoing since Sunday Pertinent Negatives: Patient denies fever, CP Disposition: [] ED /[] Urgent Care (no appt availability in office) / [] Appointment(In office/virtual)/ []  [place] [facility]/ [x] Home Care/ [] Refused Recommended Disposition /[] [place] [facility]/ [x]  Follow-up with PCP Additional Notes: Pt reports she was recently diagnosed with Flu, she reports she was prescribed Tessalon Pearles, notes no improvement in any symptoms. Reports she is experiencing diarrhea and SOB, denies wheezing, CP. Pt declines appt, requests message sent to provider for recommendations/scripts. Pharmacy verified. This RN educated pt on home care, new-worsening symptoms, when to call back/seek emergent care. Pt verbalized understanding and agrees to plan.       Copied from CRM [id number]. Topic: Clinical - Red Word Triage >> Sep 19, 2023 10:24 AM Thomes Dinning wrote: Red Word that prompted transfer to Nurse Triage: Patient states she was diagnosed with the flu and the medication is not working. She is now having trouble breathing and diarrhea Reason for Disposition  [1] HIGH RISK (e.g., age > 64 years, pregnant, HIV+, or chronic medical condition) AND [2]  > 72 hours (3 days) since evaluated by doctor (or NP/PA) AND [3] symptoms not improved  Answer Assessment - Initial Assessment Questions 1. DIAGNOSIS CONFIRMATION: "When was the influenza diagnosed?" "By whom?" "Did you get a test for it?"     OV this past week 2. MEDICINES: "Were you prescribed any medications for the influenza?"  (e.g., zanamivir [Relenza], oseltamavir [Tamiflu]).      Tessalon Pearles 3. ONSET of SYMPTOMS: "When did your symptoms  start?"     Sunday 4. SYMPTOMS: "What symptoms are you most concerned about?" (e.g., runny nose, stuffy nose, sore throat, cough, breathing difficulty, fever)     SOB constant, cough, ST, congestion, runny nose 5. COUGH: "How bad is the cough?"     Constant 6. FEVER: "Do you have a fever?" If Yes, ask: "What is your temperature, how was it measured, and when did it start?"     Unknown  Protocols used: Influenza (Flu) Follow-up Call-A-AH    Spoke with patient this morning. See Telephone notes from 09/16/2023. Message routed to Medina-Vargas, Margit Banda, NP

## 2023-09-19 NOTE — Telephone Encounter (Signed)
 Chief Complaint: Diarrhea   Pertinent Negatives: Patient denies nausea, vomiting, abdomen pain, dizziness, blood in stool  Disposition: [x] Urgent Care (no appt availability in office)   Additional Notes: Pt states she saw Medina-Vargas, NP, on 3/17 and was started on benzonatate. Pt states this medication is causing her diarrhea to worsen. Pt states she has IBS and has diarrhea on a regular basis but this medication made it worse. Pt states she has been calling and has not received a call back. This RN advised pt to go to urgent care this evening based off severity of diarrhea. This RN educated pt on home care, new-worsening symptoms, when to call back/seek emergent care.    Copied from CRM [id number]. Topic: General - Other >> Sep 19, 2023 11:13 AM Shamecia H wrote: Reason for CRM: Patient called to see who called her and I called nurse triage because she was a red word already, while waiting for the clinic to see who called patient hung up and called the clinic. Reason for Disposition  [1] SEVERE diarrhea (e.g., 7 or more times / day more than normal) AND [2] age > 60 years  Answer Assessment - Initial Assessment Questions Severe diarrhea (15x today) Onset: started last night Denies nausea, vomiting, abdomen pain, dizziness, blood in stool  Protocols used: Diarrhea-A-AH

## 2023-09-19 NOTE — Telephone Encounter (Signed)
 Chief Complaint: SOB Symptoms: diarrhea, cough, congestion Frequency: Ongoing since Sunday Pertinent Negatives: Patient denies fever, CP Disposition: [] ED /[] Urgent Care (no appt availability in office) / [] Appointment(In office/virtual)/ []  [place] [facility]/ [x] Home Care/ [] Refused Recommended Disposition /[] [place] [facility]/ [x]  Follow-up with PCP Additional Notes: Pt reports she was recently diagnosed with Flu, she reports she was prescribed Tessalon Pearles, notes no improvement in any symptoms. Reports she is experiencing diarrhea and SOB, denies wheezing, CP. Pt declines appt, requests message sent to provider for recommendations/scripts. Pharmacy verified. This RN educated pt on home care, new-worsening symptoms, when to call back/seek emergent care. Pt verbalized understanding and agrees to plan.    Copied from CRM [id number]. Topic: Clinical - Red Word Triage >> Sep 19, 2023 10:24 AM Thomes Dinning wrote: Red Word that prompted transfer to Nurse Triage: Patient states she was diagnosed with the flu and the medication is not working. She is now having trouble breathing and diarrhea Reason for Disposition  [1] HIGH RISK (e.g., age > 64 years, pregnant, HIV+, or chronic medical condition) AND [2]  > 72 hours (3 days) since evaluated by doctor (or NP/PA) AND [3] symptoms not improved  Answer Assessment - Initial Assessment Questions 1. DIAGNOSIS CONFIRMATION: "When was the influenza diagnosed?" "By whom?" "Did you get a test for it?"     OV this past week 2. MEDICINES: "Were you prescribed any medications for the influenza?"  (e.g., zanamivir [Relenza], oseltamavir [Tamiflu]).      Tessalon Pearles 3. ONSET of SYMPTOMS: "When did your symptoms start?"     Sunday 4. SYMPTOMS: "What symptoms are you most concerned about?" (e.g., runny nose, stuffy nose, sore throat, cough, breathing difficulty, fever)     SOB constant, cough, ST, congestion, runny nose 5. COUGH: "How bad is the  cough?"     Constant 6. FEVER: "Do you have a fever?" If Yes, ask: "What is your temperature, how was it measured, and when did it start?"     Unknown  Protocols used: Influenza (Flu) Follow-up Call-A-AH

## 2023-09-20 ENCOUNTER — Telehealth: Payer: Self-pay

## 2023-09-20 ENCOUNTER — Other Ambulatory Visit: Payer: Self-pay | Admitting: Adult Health

## 2023-09-20 ENCOUNTER — Encounter: Payer: Self-pay | Admitting: Adult Health

## 2023-09-20 DIAGNOSIS — R051 Acute cough: Secondary | ICD-10-CM

## 2023-09-20 DIAGNOSIS — J101 Influenza due to other identified influenza virus with other respiratory manifestations: Secondary | ICD-10-CM

## 2023-09-20 MED ORDER — ZANAMIVIR 5 MG/ACT IN AEPB: 2.0000 | INHALATION_SPRAY | Freq: Two times a day (BID) | RESPIRATORY_TRACT | 0 refills | Status: AC

## 2023-09-20 MED ORDER — GUAIFENESIN 100 MG/5ML PO LIQD: 10.0000 mL | Freq: Three times a day (TID) | ORAL | 0 refills | Status: AC

## 2023-09-20 NOTE — Telephone Encounter (Signed)
Message routed to oncall provider :

## 2023-09-20 NOTE — Telephone Encounter (Signed)
 Copied from CRM [id number]. Topic: General - Other >> Sep 20, 2023  9:44 AM Everette Rank wrote: Reason for CRM: Patient son Adela Lank calling back very updset on sevice he is getting. Patient has the flu spoke to nurse on 03/20 instructed due to medication is not working for her. He needs a update ASAP. Calling office and transferred

## 2023-09-20 NOTE — Telephone Encounter (Signed)
 Copied from CRM [id number]. Topic: General - Other >> Sep 20, 2023 11:02 AM Irine Seal wrote: Reason for CRM: Patient's son, Danniel Grenz, called requesting to speak with Bjorn Loser. Advised him that she was unavailable, but he refused to speak with anyone else. Contacted [facility] and spoke with Clydie Braun to inform her of Kevin's request and to relay that the patient had called earlier today to file a complaint. The patient stated that although her son is listed on her DPR, she no longer wants him to have access to her medical care. Attempted to transfer [name] to CAL so Clydie Braun could take a message, but he again refused to speak with anyone but Bjorn Loser. Clydie Braun stated Bjorn Loser would return his call on Monday. [name] then ended the call

## 2023-09-20 NOTE — Telephone Encounter (Signed)
 Copied from CRM [id number]. Topic: Clinical - Prescription Issue >> Sep 20, 2023 11:19 AM Hector Shade B wrote: Reason for CRM: Patient is calling to question why would medication be sent in for her and she's never saw a provider there, she stated she had not visited the office then she proceeded to state she went to the appt, and wasn't told anything, I advised the patient it does show that she was given medication for flu, she wanted to know who does the provider know she had the flu, I advised it showed where she had some medications picked up and called in stating to the triage nurse that it was giving her diarrhea, advised the patient that the pharmacy had called to advise the dosage of the flu medication was incorrect, provider corrected the dosage and sent it to the [facility] on [address], but the patient stated it was sent to the [facility] in [facility] upon checking it was sent to [employer]. The pt then stated she had not seen the provider once again. And become upset she'd curse at me several times placed the patient on hold several times hoping she would not curse at me I advised that I wouldn't be able to assist her if she continue to curse at me, I reassured her that I would send the message over to have her concerns addressed, patient stated she's been calling since last week and no one has helped her. I reassured her that I would send over the information to have her concerns addressed once again.  Patient cursed and also asked why is it her son has not been removed for the designated person to release information to.  I advised the patient that I do see the information and it was being taken care of.

## 2023-09-20 NOTE — Telephone Encounter (Signed)
 Patient son called and yelled at me again. He states that he spoke to [employer] and he was told he was going to have conversation with Avanell Shackleton, NP. I explained to him that she's seeing patients right now and I can give a message. He said that he doesn't have a message. He just wants to know what needs to be done. I told him that Im trying to help and explain to him. He hung the phone up on me again. MyChart messages sent to patient with all provider recommendations as LAST form of communication. I cannot speak when the phone constantly getting hung up on me. Hopefully he will read the messages.

## 2023-09-20 NOTE — Telephone Encounter (Signed)
 Noted.

## 2023-09-20 NOTE — Telephone Encounter (Signed)
 Pls follow up in the office for evaluation.

## 2023-09-20 NOTE — Telephone Encounter (Signed)
 I tried to give message to patient son [name] and he went off on me telling me that he's trying to get treatment for his mother because per our protocol we were suppose to get back to him in a timely manner. He states that he spoke to Wyatt Portela who was on call last night and she prescribed something else. He hung the phone up on me and didn't listen to the message I was trying to give him from [place], NP.

## 2023-09-20 NOTE — Telephone Encounter (Signed)
 Sharee Holster, NP  You29 minutes ago (12:57 PM)    This has been handled already

## 2023-09-20 NOTE — Telephone Encounter (Signed)
 Copied from CRM [id number]. Topic: Clinical - Prescription Issue >> Sep 20, 2023 12:25 PM Irine Seal wrote: Reason for CRM: [facility] contacted [place] stating they spoke with the provider regarding an issue with the Tamiflu prescription. The patient completed Tamiflu, but symptoms persist. Yesterday afternoon, a prescription for Zofluza was sent, but the pharmacy stated there is nothing further they can do as per the provider.  The patient still has flu symptoms, and no pharmacy has the medication in stock. Requesting an alternative treatment option.  Pharmacy contact: Alford Highland [phone].  Apologies for the number of CRMs associated with this case. It's been difficult to manage due to the high volume of communication, but we're working to resolve it as efficiently as possible.

## 2023-09-20 NOTE — Telephone Encounter (Signed)
 I called patient to confirm which medication needed to be sent to the correct pharmacy. Patient was confused and stated that she doesn't know what Im referring to. I could hear patient son [name] in the background saying "No you called because you need a prescription to help you feel better. Everything is getting confused. When I get a hold of Aurelio Jew, etc". Patient then told me " I will call you back".

## 2023-09-20 NOTE — Telephone Encounter (Signed)
 Nolene Bernheim, RN  [facility]16 hours ago (4:14 PM)    FYI     Forwarded to [employer].

## 2023-09-20 NOTE — Telephone Encounter (Signed)
 Message routed to Team Lead Chrae.B/CMA and Practice Administrator Aurelio Jew.

## 2023-09-20 NOTE — Telephone Encounter (Signed)
 [name] is not available at [facility]. Relenza was ordered.

## 2023-09-20 NOTE — Telephone Encounter (Signed)
 Copied from CRM [id number]. Topic: Clinical - Prescription Issue >> Sep 20, 2023 11:47 AM [name] wrote: Reason for CRM: Patient called in requesting to speak with provider about prescription refill sent to wrong location. Patient picked up prescription already on 3/17. Please contact patient back as soon as possible as she have been calling in since Monday. Please contact patient at [phone]. Patient is upset that she has not received call back yet.

## 2023-09-20 NOTE — Telephone Encounter (Signed)
 Ordered Relenza, sent e-Rx to pharmacy.

## 2023-09-20 NOTE — Telephone Encounter (Signed)
 Message routed to Team Lead Chrae.B/CMA, Stormy Fabian, and PCP Medina-Vargas, Margit Banda, NP

## 2023-09-20 NOTE — Telephone Encounter (Signed)
 Discontinue Tessalon Perles if causing diarrhea. May take Robitussin 100 mg/5 ml take 10 ml = 200 mg every 8 hours X 1 week.

## 2023-09-23 ENCOUNTER — Ambulatory Visit: Payer: Self-pay

## 2023-09-23 NOTE — Telephone Encounter (Signed)
 Patient called and wants to know what can be done about her diarrhea. I tried to get patient an appointment but she refused and wouldn't listen to anything I had to say. The conversation was getting nowhere and I had to hang up. Message routed to PCP Medina-Vargas, Margit Banda, NP

## 2023-09-23 NOTE — Telephone Encounter (Signed)
 Copied from CRM [id number]. Topic: Clinical - Pink Word Triage >> Sep 23, 2023  2:42 PM Antony Haste wrote: Reason for Triage: The patient states she has IBS and reported that she had been experiencing diarrhea since 03/18. She is still having flu-like symptoms and states the Robitussin 100 MG is not helping. Her symptoms are persisting and the patient is frustrated about this. Callback #[phone] >> Sep 23, 2023  4:16 PM Corin V wrote: Patient and husband called back regarding the medication issues. The benzonatate (TESSALON PERLES) 100 MG capsule  was discontinued and replaced by guaiFENesin (ROBITUSSIN) 100 MG/5ML liquid , but that is still not alleviating the diarrhea. She is confused why cold medications are being called in to diarrhea and why she has been prescribed 2 different inhalers within the last week ( albuterol (VENTOLIN HFA) 108 (90 Base) MCG/ACT inhaler and zanamivir (RELENZA) inhaler). She is wanting to get answers as to what each medication was prescribed for and what the diagnosis she's being treated for are since she has not been seen or tested for anything. She is also wanting a medication for her diarrhea. Attempted to contact CAL for additional information, but no answer. Computer restarted during call and unable to reach patient when system was back online.  Please call back with in depth explanation regarding her diagnosis, reason for each medication, and possible solutions for diarrhea.

## 2023-09-23 NOTE — Telephone Encounter (Signed)
 Pt called in regarding diarrhea, disconnected before she could be transferred to this RN. Attempted to reach pt, VM is full and unable to leave message.

## 2023-09-23 NOTE — Telephone Encounter (Signed)
 Copied from CRM [id number]. Topic: Clinical - Medical Advice >> Sep 23, 2023  4:57 PM [name] wrote: Reason for CRM: Patient has been experiencing diarrhea and would like her doctor to send in something for it.

## 2023-09-23 NOTE — Telephone Encounter (Signed)
 Practice Administrator Aurelio Jew notified by me along with other Admin staff about these concerns around 8am and she expressed that she would contact them at the end of the day. Not sure if she will speak to son or mother "Irene" because she has verbally expressed that she doesn't want son in her or husband medical records/information.

## 2023-09-23 NOTE — Telephone Encounter (Signed)
 Practice administrator should be notified that patients son [name] has requested to speak with her. Pamelia Hoit is in office today

## 2023-09-23 NOTE — Telephone Encounter (Signed)
 Patient called in today reporting IBS and diarrhea as well as taking Robitussin for flu-like symptoms (acute cough and Influenza per chart notes) and not improving. This RN called the patient and patient said her biggest concern is that the office keeps prescribing medications that she does not take. Pt stated she has not taken Robitussin, that she does not have a cough, and that her diarrhea is not a concern given that she's had IBS for 20 years. RN not clear on what patient needs. RN called the CAL who offered to speak with the patient.     Answer Assessment - Initial Assessment Questions 1. SITUATION:  Document reason for call.     Returning a call to talk with patient about her symptoms of diarrhea 2. BACKGROUND: Document any background information (e.g., prior calls, known psychiatric history)     Dementia, per CAL 3. ASSESSMENT: Document your nursing assessment.     Pt stated she called today because "we keep prescribing her medications she does not take." RN asked pt about the diarrhea she reported when she called in initially. Pt states she's had this for 20 years. RN mentioned robitussin that the patient is taking - pt states she is not taking that. RN asked patient about a cough, patient denies she ever had a cough despite that being in her chart. RN not clear on what patient's symptoms are since she denies now the symptoms she called about initially. 4. RESPONSE: Document what your response or recommendation was.     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 CAL offered to speak with the patient.  Protocols used: Difficult Call-A-AH

## 2023-09-23 NOTE — Telephone Encounter (Signed)
 Pls schedule an office visit so we can further evaluate.

## 2023-09-23 NOTE — Telephone Encounter (Signed)
Message routed to PCP Medina-Vargas, Monina C, NP  

## 2023-09-24 NOTE — Telephone Encounter (Signed)
 Spoke with patient to come in the follow-up but the patient wanted to know why she needs to come in because she has stated that has been having IBS for fifty years and she is willing to come in for an appointment to find other ways for the diarrhea to get better. Patient also states that she can't come out of her house because has the flu. I did the offer the patient appointment to be seen on Thursday or Friday.  Please Advise what is the next step    Message sent to Medina-Vargas, Margit Banda, NP

## 2023-09-24 NOTE — Telephone Encounter (Signed)
 Pls schedule for a follow up visit.

## 2023-09-24 NOTE — Telephone Encounter (Signed)
Forwarded to Clinical Intake.  

## 2023-09-24 NOTE — Telephone Encounter (Signed)
 Message forwarded to Clinical Intake and PCP Medina-Vargas, Margit Banda, NP

## 2023-09-25 ENCOUNTER — Telehealth: Payer: Self-pay

## 2023-09-25 NOTE — Telephone Encounter (Signed)
 I returned call to patient and left her a detailed message with Medina-Vargas, Monina C, NP response, that she is done with her isolation for influenza (Flu). I emphasized in the message that patient needs to schedule an appointment to further discuss her issues with diarrhea and encouraged her to return call to schedule an appointment.    S.Chrae B/CMA

## 2023-09-25 NOTE — Telephone Encounter (Signed)
 Pls tell daughter that she is done with her isolation for her influenza.

## 2023-09-25 NOTE — Telephone Encounter (Signed)
 Copied from CRM [id number]. 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 The patient stated she has brought the concern to NP Grayce Sessions and been told there is nothing that can be done. Patient is not experiencing any other symptoms out of her norm. Patient declines appt scheduling.  Contact Information: [phone]   Noted

## 2023-09-26 ENCOUNTER — Ambulatory Visit: Payer: Self-pay

## 2023-09-26 ENCOUNTER — Telehealth: Payer: Self-pay

## 2023-09-26 NOTE — Telephone Encounter (Signed)
 Patient called and stated she has diarrhea and needs her muscles inside of her rectum thoroughly examine to find out what can be done to alleviate her symptom of diarrhea. Patient was informed to follow-up with her gastroenterologist. Patient then stated why did we tell her to schedule an appointment and I informed patient more than once that we can discuss treatment options for diarrhea, however we do not examine internal muscles. Patient stated she told us about the diarrhea at last visit and we did nothing, I shared the details of last visit and emphasized that diarrhea was not a concern.  Patient used profanity x 4 during the call after being asked to refrain from being disrespectful. Call ended!

## 2023-09-26 NOTE — Telephone Encounter (Signed)
 Please see notes below. Patient called in very upset that she has been calling daily for a prescription to be called in for her diarrhea. Patient states she doesn't understand why she needs to come into the office for an appointment, per PCP notes, for something she has been seeing a doctor for for 50 years with IBS. This RN asked patient when the last time she was seen and evaluated by a provider for IBS and patient states about a year ago with Gastroenterology. Patient refuses to come in and wants explanation as to why she has to come into the office. This RN transferred patient to CAL to speak with nurse that works directly with patient's PCP.   Copied from CRM [id number]. Topic: Clinical - Medication Question >> Sep 26, 2023  4:22 PM Adrianna P wrote: Reason for CRM: patient is wanting a prescription for her diarrhea  Reason for Disposition  [1] Caller demands to speak with the PCP AND [2] about sick adult (or sick caller)  Answer Assessment - Initial Assessment Questions 1. SITUATION:  Document reason for call.     Patient very upset that doctor hasn't called in anything for her diarrhea 2. BACKGROUND: Document any background information (e.g., prior calls, known psychiatric history)     Prior calls daily asking for medication to be called in 3. ASSESSMENT: Document your nursing assessment.     Patient states she has 50 year history of seeing the doctor for IBS and does not want to pay a co-pay to come in for an appt just to have a medication called in for IBS diarrhea 4. RESPONSE: Document what your response or recommendation was.     This RN transferred patient to nurse at CAL to discuss why an appointment is necessary.  Protocols used: Difficult Call-A-AH

## 2023-09-27 ENCOUNTER — Telehealth: Payer: Self-pay | Admitting: Adult Health

## 2023-09-27 NOTE — Telephone Encounter (Signed)
 Type of order/referral and detailed reason for visit: patient is wanting to see a gastroenterologist or a surgeon to help with "torn rectal sphincter". Per patient her mother had same issue and her sister had same issue. Sister had a gastroenterologist perform a surgical procedure to tighten rectum up so that diarrhea issue resolved itself. She is wanting to be seen by someone who can evaluate her for this surgery or similar procedures. She is not always getting the signal with the need to defecate and is having accidents at night and waking up soiled. Per patient her gastroenterologist passed away and the office advised that PCP needs to take over GI care.

## 2023-09-30 ENCOUNTER — Other Ambulatory Visit: Payer: Self-pay | Admitting: Adult Health

## 2023-09-30 ENCOUNTER — Telehealth: Payer: Self-pay

## 2023-09-30 ENCOUNTER — Ambulatory Visit: Payer: Self-pay

## 2023-09-30 DIAGNOSIS — R197 Diarrhea, unspecified: Secondary | ICD-10-CM

## 2023-09-30 MED ORDER — LOPERAMIDE HCL 2 MG PO TABS: 2.0000 mg | ORAL_TABLET | Freq: Four times a day (QID) | ORAL | Status: AC | PRN

## 2023-09-30 NOTE — Telephone Encounter (Signed)
 Copied from CRM [id number]. Topic: Clinical - Medication Question >> Sep 30, 2023 12:56 PM Alcus Dad H wrote: Reason for CRM: Patient is calling in to ask about diarrhea medication. Says she has contacted her pharmacy and they told her medication has not been called in. I confirmed with patient that nothing has been called in for diarrhea and she wants to know why not. She said someone was supposed to call her back Friday and no one called her back. Let patient know clinical team is at lunch right now and someone will return her call when they return.

## 2023-09-30 NOTE — Telephone Encounter (Signed)
 Patient declined in making an appointment, stated she has had diarrhea for over 50 years and imodium does not work.  She asked has the provider ever heard of IBS? Please advise.

## 2023-09-30 NOTE — Telephone Encounter (Signed)
Message routed to PCP Medina-Vargas, Monina C, NP  

## 2023-09-30 NOTE — Telephone Encounter (Signed)
 Issue has been addressed numerous times. MyChart messages sent to patient about concerns. Nothing else follows at this time.

## 2023-09-30 NOTE — Telephone Encounter (Signed)
 Patient transferred from agent due to being irate that she has not received a call back from last three phone call attempts to office. Patient is stating contiuously that she has had IBS for 50 years and has taken Immodium that does not work and wants provider to write her something new. This RN read patient the notes that provider suggests being seen in office. Patient states she will not come into office because provider "does not help her at all and does not care." This RN offered a transfer of care request to a new provider and patient states "what are they going to do? Nothing?" This RN stated she will send high priority message to clinic and that is all I can assist with at this time.   Copied from CRM [id number]. Topic: Clinical - Prescription Issue >> Sep 30, 2023  5:05 PM Adrianna P wrote: Reason for CRM: patient has IBS and stated that she has taken (IMODIUM A-D) 2 MG tablet before and it does not help and she would like a prescription for something else. Reason for Disposition  [1] Angry or rude caller AND [2] doesn't respond to 5 minutes of triager counseling AND [3] sick adult (or caller)  Answer Assessment - Initial Assessment Questions 1. SITUATION:  Document reason for call.     Patient wants new medication wise 2. BACKGROUND: Document any background information (e.g., prior calls, known psychiatric history)     Unsure  3. ASSESSMENT: Document your nursing assessment.     Patient is very frustrated and difficult  4. RESPONSE: Document what your response or recommendation was.     Patient would like to speak to PCP  Protocols used: Difficult Call-A-AH

## 2023-09-30 NOTE — Telephone Encounter (Signed)
 MyChart message sent to patient.

## 2023-09-30 NOTE — Telephone Encounter (Signed)
MyChart message sent to patient in another encounter.

## 2023-09-30 NOTE — Telephone Encounter (Signed)
 MyChart message sent to patient along with another message provided by PCP Medina-Vargas, Margit Banda, NP

## 2023-09-30 NOTE — Telephone Encounter (Signed)
 Pls follow up in office for further evaluation.  Relenza was ordered for your flu but was not available at your pharmacy. Influenza was diagnosed on 3/17 and treatment window is now over. If you are still experiencing symptoms from it then pls come to the clinic for further evaluation.

## 2023-09-30 NOTE — Telephone Encounter (Signed)
 You can take Imodium 2 mg QID PRN for diarrhea. Pls follow up in the office so we can further assess diarrhea.

## 2023-10-01 ENCOUNTER — Ambulatory Visit: Payer: Self-pay

## 2023-10-01 NOTE — Telephone Encounter (Signed)
 Message routed to [facility].B/CMA in Clinical Intake. Please call patient and address her concerns.

## 2023-10-01 NOTE — Telephone Encounter (Signed)
 Copied from CRM [id number]. Topic: Clinical - Red Word Triage >> Oct 01, 2023  1:52 PM Louie Boston wrote: Red Word that prompted transfer to Nurse Triage: Diarrhea  Chief Complaint: diarrhea x 50 years Symptoms: 8-10 times daily. Frequency: daily Pertinent Negatives: Patient denies any new symptoms; was triaged yesterday and states Imodium does not work for her.  Disposition: [] ED /[] Urgent Care (no appt availability in office) / [] Appointment(In office/virtual)/ []  [facility]/ [x] Home Care/ [] Refused Recommended Disposition /[] [facility]/ []  Follow-up with PCP Additional Notes: states imodium is not working.  Would like a call back from the office.  Care advice given, denies questions; instructed to go to ER if becomes worse.   Reason for Disposition  SEVERE diarrhea (e.g., 7 or more times / day more than normal)  Answer Assessment - Initial Assessment Questions 1. DIARRHEA SEVERITY: "How bad is the diarrhea?" "How many more stools have you had in the past 24 hours than normal?"    - NO DIARRHEA (SCALE 0)   - MILD (SCALE 1-3): Few loose or mushy BMs; increase of 1-3 stools over normal daily number of stools; mild increase in ostomy output.   -  MODERATE (SCALE 4-7): Increase of 4-6 stools daily over normal; moderate increase in ostomy output.   -  SEVERE (SCALE 8-10; OR "WORST POSSIBLE"): Increase of 7 or more stools daily over normal; moderate increase in ostomy output; incontinence.     8-10 times 2. ONSET: "When did the diarrhea begin?"      50 years ago and immodium is not working 3. BM CONSISTENCY: "How loose or watery is the diarrhea?"      Both depends on time of day 4. VOMITING: "Are you also vomiting?" If Yes, ask: "How many times in the past 24 hours?"      denies 5. ABDOMEN PAIN: "Are you having any abdomen pain?" If Yes, ask: "What does it feel like?" (e.g., crampy, dull, intermittent, constant)      denies 6. ABDOMEN PAIN SEVERITY: If present, ask: "How  bad is the pain?"  (e.g., Scale 1-10; mild, moderate, or severe)   - MILD (1-3): doesn't interfere with normal activities, abdomen soft and not tender to touch    - MODERATE (4-7): interferes with normal activities or awakens from sleep, abdomen tender to touch    - SEVERE (8-10): excruciating pain, doubled over, unable to do any normal activities       na 7. ORAL INTAKE: If vomiting, "Have you been able to drink liquids?" "How much liquids have you had in the past 24 hours?"     denies 8. HYDRATION: "Any signs of dehydration?" (e.g., dry mouth [not just dry lips], too weak to stand, dizziness, new weight loss) "When did you last urinate?"     denies 9. EXPOSURE: "Have you traveled to a foreign country recently?" "Have you been exposed to anyone with diarrhea?" "Could you have eaten any food that was spoiled?"     na 10. ANTIBIOTIC USE: "Are you taking antibiotics now or have you taken antibiotics in the past 2 months?"       Denies  11. OTHER SYMPTOMS: "Do you have any other symptoms?" (e.g., fever, blood in stool)       Denies  12. PREGNANCY: "Is there any chance you are pregnant?" "When was your last menstrual period?"       na  Protocols used: [facility]

## 2023-10-01 NOTE — Telephone Encounter (Signed)
 Message routed to [facility].B/CMA in Clinical Intake.

## 2023-10-02 NOTE — Telephone Encounter (Signed)
 She has replied to your message however, she's still only able to message me directly. I don't understand why or how??

## 2023-10-08 ENCOUNTER — Telehealth: Payer: Self-pay | Admitting: Adult Health

## 2023-10-08 NOTE — Telephone Encounter (Signed)
 Jones Skene, son, regarding paper works whereabouts and will sign once available. Left voice message.

## 2023-10-08 NOTE — Telephone Encounter (Signed)
 Please advise.....  Copied from CRM [id number]. Topic: Medical Record Request - Other >> Oct 08, 2023  9:49 AM Thomes Dinning wrote: Reason for CRM: Patients son [name] is requesting Dr.Vargas to complete paperwork for his mother medical power of attorney. [name] would like a call back at [phone] if any additional info is needed.

## 2023-10-09 NOTE — Telephone Encounter (Signed)
 Per Research officer, political party we can not speak with [name] per the patients request to refrain from speaking with son (refer to one of the telephone encounters dated 09/20/23 by Meda Klinefelter, CMA)

## 2023-10-09 NOTE — Telephone Encounter (Signed)
 [name] and I called [name] on speaker phone, [name] explained that we would need a updated HCPOA as the one on file from 2014 has the HCPOA portion marked through.   [name] is aware that we can no longer communicate, provider any letters, or records to him without the patients authorizing [place] to.

## 2023-10-24 ENCOUNTER — Ambulatory Visit: Payer: Self-pay

## 2023-10-24 NOTE — Telephone Encounter (Signed)
 I will need HCPOA from son in order to write the letter he needs.

## 2023-10-24 NOTE — Telephone Encounter (Signed)
 Per response from Triage Notes from Triage Nurse waiting on the son to completed documenting from the attorney office

## 2023-10-24 NOTE — Telephone Encounter (Signed)
 Pt son Ernestina Headland states that they are working on getting new HIPAA and healthcare paperwork redone. Son states that he will bring updated paperwork once completed from attorney. Son states at that point he will need a letter from PCP for [facility] assistance in the future.    Copied from CRM [id number]. Topic: Clinical - Medical Advice >> Oct 24, 2023  9:03 AM Danelle Dunning F wrote: Reason for CRM:   Patient 's son called in to request that the patient's PCP draft a letter to explain the patient's current health condition and her dementia diagnosis for the [place].    Callback Number: [phone] Reason for Disposition . [1] Follow-up call to recent contact AND [2] information only call, no triage required  Protocols used: Information Only Call - No Triage-A-AH

## 2023-10-24 NOTE — Telephone Encounter (Signed)
 See Triage Notes from Triage Nurse FYI.  Message sent to Medina-Vargas, Margit Banda, NP

## 2023-10-28 NOTE — Telephone Encounter (Signed)
 Nothing else follows at this time.

## 2023-11-28 ENCOUNTER — Telehealth: Admitting: *Deleted

## 2023-11-28 NOTE — Telephone Encounter (Signed)
 Copied from CRM [id number]. Topic: General - Other >> Matasha Smigelski 29, 2025  9:43 AM Retta Caster wrote: Reason for CRM: Patient son Ernestina Headland calling on when he can drop off VA care Letter for POA/HIPPA Realse/directive. Needs call back when he can do this.  [phone]     Son, Ernestina Headland had POA paperwork drawn up by a lawyer and is going to drop off a copy for our records.

## 2023-12-09 ENCOUNTER — Ambulatory Visit: Admitting: Adult Health

## 2023-12-09 ENCOUNTER — Encounter: Payer: Self-pay | Admitting: Adult Health

## 2023-12-09 VITALS — BP 122/75 | HR 94 | Temp 97.6°F | Resp 18 | Ht 62.0 in | Wt 165.4 lb

## 2023-12-09 DIAGNOSIS — F5101 Primary insomnia: Secondary | ICD-10-CM

## 2023-12-09 DIAGNOSIS — K580 Irritable bowel syndrome with diarrhea: Secondary | ICD-10-CM

## 2023-12-09 DIAGNOSIS — R251 Tremor, unspecified: Secondary | ICD-10-CM

## 2023-12-09 DIAGNOSIS — E782 Mixed hyperlipidemia: Secondary | ICD-10-CM

## 2023-12-09 DIAGNOSIS — R7303 Prediabetes: Secondary | ICD-10-CM

## 2023-12-09 DIAGNOSIS — Z13820 Encounter for screening for osteoporosis: Secondary | ICD-10-CM

## 2023-12-09 DIAGNOSIS — R4189 Other symptoms and signs involving cognitive functions and awareness: Secondary | ICD-10-CM

## 2023-12-09 DIAGNOSIS — I10 Essential (primary) hypertension: Secondary | ICD-10-CM | POA: Diagnosis not present

## 2023-12-09 DIAGNOSIS — I480 Paroxysmal atrial fibrillation: Secondary | ICD-10-CM

## 2023-12-09 DIAGNOSIS — R300 Dysuria: Secondary | ICD-10-CM

## 2023-12-09 MED ORDER — TRAZODONE HCL 50 MG PO TABS
150.0000 mg | ORAL_TABLET | Freq: Every day | ORAL | 3 refills | Status: DC
Start: 2023-12-09 — End: 2024-01-09

## 2023-12-09 NOTE — Telephone Encounter (Signed)
 Left [name] Pt.'s son a voicemail letting him know the paperwork is ready for pickup per Monina.

## 2023-12-09 NOTE — Progress Notes (Unsigned)
 [facility] clinic  Provider:  Inge Mangle DNP  Code Status: ***  Goals of Care:     09/16/2023    1:29 PM  Advanced Directives  Does Patient Have a Medical Advance Directive? Yes  Type of Estate agent of [place];Out of facility DNR (pink MOST or yellow form);Living will  Does patient want to make changes to medical advance directive? No - Patient declined  Copy of Healthcare Power of Attorney in Chart? Yes - validated most recent copy scanned in chart (See row information)     Chief Complaint  Patient presents with   Letter for School/Work     Discuss paperwork     HPI: Patient is a 78 y.o. female seen today for an acute visit for Discussed the use of AI scribe software for clinical note transcription with the patient, who gave verbal consent to proceed.  History of Present Illness   Dr. Danley Dusky 3 weeks ago for eye check   Past Medical History:  Diagnosis Date   Anticoagulated    Pradaxa    Bilateral leg pain    Right leg has greater pain than the left.   Coronary artery disease cardiologist-- dr Nunzio Belch   a.  s/p Xience DES to RCA 04/2009;   b. TEE 2/12: EF 40%, Large PFO;  c.  Lexiscan  Myoview 05/2012: EF 69%, no ischemia. LHC (05/2012):  Ostial diagonal 30-40%, proximal mid ramus intermedius 40-50%, RCA stent patent with 40-50% after stent, then 40%, distal RCA 40-50%, EF 55-65%. Medical therapy continued.;  d.  LexiScan  Myoview (06/2013):  No ischemia, EF 83%, normal study   Full dentures    GERD (gastroesophageal reflux disease)    History of basal cell carcinoma excision    scalp   History of hiatal hernia    History of kidney stones 51 years ago   x1   History of recurrent UTIs    Hypertension    IFG (impaired fasting glucose)    MVP (mitral valve prolapse)    mild per last echo 04-03-2017 in epic   OA (osteoarthritis)    knees , fingers   PAF (paroxysmal atrial fibrillation) [facility]    cardiologist-- dr Nunzio Belch   S/P ablation of  atrial fibrillation 08/15/2010   S/P drug eluting coronary stent placement 04/04/2009   Spinal stenosis    Tremor    Wears glasses     Past Surgical History:  Procedure Laterality Date   ABDOMINAL EXPOSURE N/A 05/11/2020   Procedure: ABDOMINAL EXPOSURE;  Surgeon: Mayo Speck, MD;  Location: [facility] OR;  Service: Vascular;  Laterality: N/A;   ANAL RECTAL MANOMETRY N/A 01/11/2014   Procedure: ANAL RECTAL MANOMETRY;  Surgeon: Joyce Nixon, MD;  Location: [facility] ENDOSCOPY;  Service: Endoscopy;  Laterality: N/A;   APPENDECTOMY  1973   BACK SURGERY     laminectomy times 2; 2000 and 2001   BUNIONECTOMY Bilateral 2013   CARDIAC CATHETERIZATION  10-24-2010   dr allred   singl-vessel CAD with patent stent mRCA/  moderate disease mRCA beyond stent segment/  normal lvsf   CARDIAC ELECTROPHYSIOLOGY MAPPING AND ABLATION  08-15-2010  dr allred   CARDIOVASCULAR STRESS TEST  06-15-2013  dr allred   normal perfusion study/  no ischemia/  ef 83%   CARPAL TUNNEL RELEASE Right 25 years ago   CATARACT EXTRACTION W/ INTRAOCULAR LENS  IMPLANT, BILATERAL     CHOLECYSTECTOMY OPEN  1973   W/  APPENDECTOMY   CORONARY ANGIOPLASTY WITH STENT PLACEMENT  04-04-2009  dr Therese Flash brodie   PCI and DES x1 to  mRCA/  mLAD 40%, pCX 30%, pRCA 30%/  normal LVF   FLEXIBLE SIGMOIDOSCOPY N/A 07/07/2013   Procedure: FLEXIBLE SIGMOIDOSCOPY;  Surgeon: Venson Ginger., MD;  Location: [facility];  Service: Endoscopy;  Laterality: N/A;  unprepped   LEFT HEART CATH AND CORONARY ANGIOGRAPHY N/A 10/26/2019   Procedure: LEFT HEART CATH AND CORONARY ANGIOGRAPHY;  Surgeon: Swaziland, Peter M, MD;  Location: [facility] INVASIVE CV LAB;  Service: Cardiovascular;  Laterality: N/A;   LEFT HEART CATHETERIZATION WITH CORONARY ANGIOGRAM N/A 05/09/2012   Procedure: LEFT HEART CATHETERIZATION WITH CORONARY ANGIOGRAM;  Surgeon: Kristopher Pheasant, MD;  Location: [facility] CATH LAB;  Service: Cardiovascular;  Laterality: N/A;   MOHS SURGERY  2009   scalp   PILONIDAL CYST  EXCISION  2009;   2000;   1999   RECTAL ULTRASOUND N/A 01/11/2014   Procedure: RECTAL ULTRASOUND;  Surgeon: Joyce Nixon, MD;  Location: [facility];  Service: Endoscopy;  Laterality: N/A;   TOTAL KNEE ARTHROPLASTY Right 09/07/2014   Procedure: RIGHT TOTAL KNEE ARTHROPLASTY;  Surgeon: Claiborne Crew, MD;  Location: [facility] ORS;  Service: Orthopedics;  Laterality: Right;   TOTAL KNEE ARTHROPLASTY Left 12/20/2015   Procedure: LEFT TOTAL KNEE ARTHROPLASTY;  Surgeon: Claiborne Crew, MD;  Location: [facility] ORS;  Service: Orthopedics;  Laterality: Left;   TRANSTHORACIC ECHOCARDIOGRAM  04-03-2017   dr allred   Rhunette Cha 55-60%/  mild MVP anterior leaflet (valve area 2.1cm^2) with mild regurg. , no stenosis/  trivial TR and PR   VAGINAL HYSTERECTOMY  1975   W/ BILATERAL SALPINOOPHOORECTOMY   VAGUS NERVE STIMULATOR INSERTION N/A 03/10/2014   Procedure: IMPLANTATION OF SACRAL  NERVE STIMULATOR ;  Surgeon: Joyce Nixon, MD;  Location: [facility];  Service: General;  Laterality: N/A;  Sacral. [employer]    Allergies  Allergen Reactions   Cephalexin  Nausea Only    Outpatient Encounter Medications as of 12/09/2023  Medication Sig   albuterol  (VENTOLIN  HFA) 108 (90 Base) MCG/ACT inhaler Inhale 2 puffs into the lungs every 6 (six) hours as needed for wheezing or shortness of breath.   dabigatran  (PRADAXA ) 150 MG CAPS capsule Take 1 capsule (150 mg total) by mouth 2 (two) times daily.   hydrochlorothiazide  (MICROZIDE ) 12.5 MG capsule TAKE 1 CAPSULE DAILY   lisinopril  (PRINIVIL ,ZESTRIL ) 10 MG tablet Take 1 tablet (10 mg total) by mouth daily.   loperamide  (IMODIUM  A-D) 2 MG tablet Take 1 tablet (2 mg total) by mouth 4 (four) times daily as needed for diarrhea or loose stools.   primidone  (MYSOLINE ) 50 MG tablet TAKE 1 TABLET TWICE A DAY   traZODone  (DESYREL ) 50 MG tablet Take 3 tablets (150 mg total) by mouth at bedtime.   No facility-administered encounter medications on file as of 12/09/2023.    Review of  Systems:  Review of Systems  Constitutional:  Negative for appetite change, chills, fatigue and fever.  HENT:  Negative for congestion, hearing loss, rhinorrhea and sore throat.   Eyes: Negative.   Respiratory:  Negative for cough, shortness of breath and wheezing.   Cardiovascular:  Negative for chest pain, palpitations and leg swelling.  Gastrointestinal:  Negative for abdominal pain, constipation, diarrhea, nausea and vomiting.  Genitourinary:  Negative for dysuria.  Musculoskeletal:  Negative for arthralgias, back pain and myalgias.  Skin:  Negative for color change, rash and wound.  Neurological:  Negative for dizziness, weakness and headaches.  Psychiatric/Behavioral:  Negative for behavioral problems. The patient is  not nervous/anxious.     Health Maintenance  Topic Date Due   Diabetic kidney evaluation - Urine ACR  Never done   DTaP/Tdap/Td (1 - Tdap) Never done   DEXA SCAN  Never done   Zoster Vaccines- Shingrix (2 of 2) 06/07/2017   Medicare Annual Wellness (AWV)  04/01/2018   OPHTHALMOLOGY EXAM  04/03/2023   HEMOGLOBIN A1C  10/17/2023   COVID-19 Vaccine (4 - 2024-25 season) 03/02/2024 (Originally 03/03/2023)   INFLUENZA VACCINE  01/31/2024   Diabetic kidney evaluation - eGFR measurement  04/17/2024   FOOT EXAM  04/17/2024   Pneumonia Vaccine 93+ Years old  Completed   Hepatitis C Screening  Completed   HPV VACCINES  Aged Out   Meningococcal B Vaccine  Aged Out   Colonoscopy  Discontinued    Physical Exam: Vitals:   12/09/23 1055  BP: 122/75  Pulse: 94  Resp: 18  Temp: 97.6 F (36.4 C)  SpO2: 98%  Weight: 165 lb 6.4 oz (75 kg)  Height: 5\' 2"  (1.575 m)   Body mass index is 30.25 kg/m. Physical Exam Constitutional:      Appearance: Normal appearance.  HENT:     Head: Normocephalic and atraumatic.     Nose: Nose normal.     Mouth/Throat:     Mouth: Mucous membranes are moist.  Eyes:     Conjunctiva/sclera: Conjunctivae normal.  Cardiovascular:     Rate  and Rhythm: Normal rate and regular rhythm.  Pulmonary:     Effort: Pulmonary effort is normal.     Breath sounds: Normal breath sounds.  Abdominal:     General: Bowel sounds are normal.     Palpations: Abdomen is soft.  Musculoskeletal:        General: Normal range of motion.     Cervical back: Normal range of motion.  Skin:    General: Skin is warm and dry.  Neurological:     General: No focal deficit present.     Mental Status: She is alert and oriented to person, place, and time.  Psychiatric:        Mood and Affect: Mood normal.        Behavior: Behavior normal.        Thought Content: Thought content normal.        Judgment: Judgment normal.     Labs reviewed: Basic Metabolic Panel: Recent Labs    02/01/23 1546 04/18/23 1058  NA 139 138  K 3.4* 3.6  CL 100 99  CO2 29 28  GLUCOSE 120 81  BUN 18 27*  CREATININE 1.38* 1.21*  CALCIUM  9.8 9.3   Liver Function Tests: Recent Labs    02/01/23 1546  AST 15  ALT 10  BILITOT 0.4  PROT 6.8   No results for input(s): "LIPASE", "AMYLASE" in the last 8760 hours. No results for input(s): "AMMONIA" in the last 8760 hours. CBC: Recent Labs    02/01/23 1546 06/11/23 1123  WBC 6.6 4.5  NEUTROABS 4,224  --   HGB 14.3 12.4  HCT 40.9 36.2  MCV 96.9 100.3*  PLT 214 176   Lipid Panel: Recent Labs    04/18/23 1058  CHOL 182  HDL 47*  LDLCALC 101*  TRIG 217*  CHOLHDL 3.9   Lab Results  Component Value Date   HGBA1C 5.7 (H) 04/18/2023    Procedures since last visit: No results found.  Assessment/Plan Assessment and Plan Assessment & Plan     ***   Lab Results  Component Value Date   HGBA1C 5.7 (H) 04/18/2023     Labs/tests ordered:  * No order type specified *   No follow-ups on file.  Tydarius Yawn Medina-Vargas, NP

## 2023-12-10 ENCOUNTER — Ambulatory Visit: Payer: Self-pay

## 2023-12-10 ENCOUNTER — Ambulatory Visit: Payer: Self-pay | Admitting: Adult Health

## 2023-12-10 ENCOUNTER — Other Ambulatory Visit: Payer: Self-pay

## 2023-12-10 MED ORDER — LISINOPRIL 10 MG PO TABS: 10.0000 mg | ORAL_TABLET | Freq: Every day | ORAL | 1 refills | Status: DC

## 2023-12-10 MED ORDER — POTASSIUM CHLORIDE ER 10 MEQ PO TBCR: 10.0000 meq | EXTENDED_RELEASE_TABLET | ORAL | 1 refills | Status: AC

## 2023-12-10 NOTE — Telephone Encounter (Signed)
 Refill sent to pharmacy.

## 2023-12-10 NOTE — Telephone Encounter (Signed)
 Copied from CRM [id number]. Topic: Clinical - Medical Advice >> Dec 10, 2023  3:19 PM Adrianna P wrote: Reason for CRM: Patient states that potassium chloride  (KLOR-CON ) 10 MEQ tablet gives her diarrhea, and she is already dealing with IBS, She would like something for her diarrhea

## 2023-12-10 NOTE — Telephone Encounter (Signed)
 Summary: medication, diarrhea   Copied From CRM [id number]. Pt Trapani said a medication was supposed to be called for diarrhea but the pharmacy, did not say the name of the med but that they no longer make it... Please advise.         Callback made to pt from nurse: no answer: LVM

## 2023-12-10 NOTE — Telephone Encounter (Signed)
 FYI Only or Action Required?: Action required by provider  Patient was last seen in primary care on 12/09/2023 by Medina-Vargas, Sid Dragon, NP. Called Nurse Triage reporting Advice Only.    Triage Disposition: call PCP: there fore routing chart/note to PCP   Patient/caregiver understands and will follow disposition?:  Reason for Disposition  [1] Angry or rude caller AND [2] doesn't respond to 5 minutes of triager counseling AND [3] sick adult (or caller)  Answer Assessment - Initial Assessment Questions 1. SITUATION:  Document reason for call.     Pt is upset stated the new medication with "K" that the PCP placed her on is a medication the pt told the PCP yesterday she can not take.  However the prescription is at the pharmacy ready.  Pt states this medication will cause serious diarrhea also in patients and states that is not good for her.  Pt stated this problem has been going for 2 weeks with no resolve.  Pt states upset due to PCPs not listening to patients altogether and understanding when patients know a little about their own bodies. 2. RESPONSE: Document what your response or recommendation was.     I apologized on our behave and informed the patient that I would route this information over to PCP to make PCP aware.  Patient would like a call back to discuss.  Patient also stated her PCP was suppose to email the pt's last set of lab results.  Pt stated she has not received anything yet and now she has a paper that the pt needs to fill out regarding the results and can't because mychart for some reason has her blocked and the PCP has not sent her the email containing her lab values.  Protocols used: Difficult Call-A-AH

## 2023-12-10 NOTE — Telephone Encounter (Signed)
 This patient already triaged. Duplicate encounter. No further needs.

## 2023-12-10 NOTE — Progress Notes (Signed)
-     K3.3, low (normal 3.5-5.3) will need to start on potassium supplementation KCL 10 meq daily on MWF, will send e Rx to pharmacy ([facility]), eat a banana daily -   Triglycerides 192, down from 217, normal level <150, will need to exercise for at least 150 minutes/week, low-fat diet, avoid refined sugar -   LDL and cholesterol normal -   A1c 5.6, normal

## 2023-12-11 LAB — URINALYSIS, ROUTINE W REFLEX MICROSCOPIC
Bilirubin Urine: NEGATIVE
Glucose, UA: NEGATIVE
Hyaline Cast: NONE SEEN /LPF
Ketones, ur: NEGATIVE
Nitrite: NEGATIVE
Protein, ur: NEGATIVE
Specific Gravity, Urine: 1.017 (ref 1.001–1.035)
WBC, UA: >=60 /HPF — AB (ref 0–5)
pH: 5.5 (ref 5.0–8.0)

## 2023-12-11 LAB — URINE CULTURE
MICRO NUMBER:: 16562146
SPECIMEN QUALITY:: ADEQUATE

## 2023-12-11 LAB — MICROSCOPIC MESSAGE

## 2023-12-11 LAB — MICROALBUMIN / CREATININE URINE RATIO
Creatinine, Urine: 82 mg/dL (ref 20–275)
Microalb Creat Ratio: 33 mg/g{creat} — ABNORMAL HIGH (ref <30)
Microalb, Ur: 2.7 mg/dL

## 2023-12-11 NOTE — Progress Notes (Signed)
-   Urine microalbumin creatinine ratio slightly elevated, continue lisinopril  -  If  Klor con causing diarrhea, pls take banana everyday for potassium supplementation

## 2023-12-12 NOTE — Progress Notes (Signed)
 Urine culture is not indicative of UTI, bacteria should be >100,000 CFU/ml for UTI diagnosis. No new orders.

## 2023-12-13 NOTE — Progress Notes (Signed)
-    not diagnostic of UTI. No new order.

## 2023-12-16 ENCOUNTER — Telehealth: Payer: Self-pay

## 2023-12-16 NOTE — Telephone Encounter (Signed)
 Spoke with patient, patient states Dr.Pyrtle (GI) retired and was told there is nothing they can do. Patient states there must be some new medication or something that we can do to help with diarrhea. Patient states she has explained to Monina several time that she has an issue with her sphincter muscle from having children 59 years ago

## 2023-12-16 NOTE — Telephone Encounter (Signed)
 Spoke with patient, patient states at her last appointment she explained to Medina-Vargas, Monina C, NP that imodium  is not effective and she would like to know the plan to address her diarrhea  Please advise

## 2023-12-16 NOTE — Telephone Encounter (Signed)
 Copied from CRM [id number]. Topic: General - Other >> Dec 16, 2023  1:00 PM Adrianna P wrote: Reason for CRM: Patient calling in for update on medication for her diarrhea. [phone]

## 2023-12-16 NOTE — Telephone Encounter (Signed)
 I would like to recommend for her to see a GI doctor. Does she have a GI doctor? If not, I can send a referral to GI, just let me know.

## 2023-12-17 ENCOUNTER — Other Ambulatory Visit: Payer: Self-pay | Admitting: Adult Health

## 2023-12-17 DIAGNOSIS — K529 Noninfective gastroenteritis and colitis, unspecified: Secondary | ICD-10-CM

## 2023-12-17 DIAGNOSIS — E876 Hypokalemia: Secondary | ICD-10-CM

## 2023-12-17 NOTE — Telephone Encounter (Signed)
 Called patient regarding low K. She stated that she KCL and banana makes her have diarrhea. She agreed for a repeat BMP and GI consult regarding diarrhea.

## 2023-12-17 NOTE — Progress Notes (Signed)
 Called patient and discussed low potassium level. She stated that KCL and banana can make he have diarrhea. She agreed to have gastroenterology consult and discussed that a referral will be sent for her diarrhea.

## 2023-12-19 ENCOUNTER — Other Ambulatory Visit

## 2023-12-19 ENCOUNTER — Ambulatory Visit: Payer: Self-pay

## 2023-12-19 NOTE — Telephone Encounter (Signed)
 FYI Only or Action Required?: Action required by provider: update on patient condition.  Patient was last seen in primary care on 12/09/2023 by Medina-Vargas, Sid Dragon, NP. Called Nurse Triage reporting Urinary Frequency. Symptoms began several weeks ago. Interventions attempted: Prescription medications: Antibiotics. Symptoms are: unchanged.  Triage Disposition: See Physician Within 24 Hours  Patient/caregiver understands and will follow disposition?: No, wishes to speak with PCP  **Patient would like provider to know urinary symptoms persist, pt. Declined an appointment**                  Copied from CRM [phone]. Topic: Clinical - Medical Advice >> Dec 19, 2023  5:51 PM Adrianna P wrote: Reason for CRM: Patient called about labs being abnormal. She is still having the burning and the itching   ----------------------------------------------------------------------- From previous Reason for Contact - Other: Reason for CRM: Patient called about labs being abnormal. She is still having the burning and the itching Reason for Disposition  Urinating more frequently than usual (i.e., frequency)  Answer Assessment - Initial Assessment Questions 1. SYMPTOM: What's the main symptom you're concerned about? (e.g., frequency, incontinence)     Itching burning and frequency.  2. ONSET: When did the  symptoms  start?      She stated on and off for 55 years, but currently she states x 3-4 weeks. 3. PAIN: Is there any pain? If Yes, ask: How bad is it? (Scale: 1-10; mild, moderate, severe)     No 4. CAUSE: What do you think is causing the symptoms?     Unknown but suspects Kidney stones as she had that in the past.  5. OTHER SYMPTOMS: Do you have any other symptoms? (e.g., blood in urine, fever, flank pain, pain with urination)    No  Protocols used: Urinary Symptoms-A-AH

## 2024-01-01 ENCOUNTER — Other Ambulatory Visit: Payer: Self-pay | Admitting: Adult Health

## 2024-01-01 DIAGNOSIS — K219 Gastro-esophageal reflux disease without esophagitis: Secondary | ICD-10-CM

## 2024-01-09 ENCOUNTER — Ambulatory Visit: Admitting: Adult Health

## 2024-01-09 VITALS — BP 112/72 | HR 76 | Temp 97.7°F | Resp 12 | Ht 62.0 in | Wt 166.4 lb

## 2024-01-09 DIAGNOSIS — Z0000 Encounter for general adult medical examination without abnormal findings: Secondary | ICD-10-CM

## 2024-01-09 DIAGNOSIS — Z13820 Encounter for screening for osteoporosis: Secondary | ICD-10-CM | POA: Diagnosis not present

## 2024-01-09 DIAGNOSIS — F5101 Primary insomnia: Secondary | ICD-10-CM | POA: Diagnosis not present

## 2024-01-09 MED ORDER — TRAZODONE HCL 50 MG PO TABS
150.0000 mg | ORAL_TABLET | Freq: Every day | ORAL | 3 refills | Status: DC
Start: 2024-01-09 — End: 2024-05-15

## 2024-01-09 MED ORDER — PANTOPRAZOLE SODIUM 40 MG PO TBEC: 40.0000 mg | DELAYED_RELEASE_TABLET | Freq: Every day | ORAL | 3 refills | Status: DC

## 2024-01-09 NOTE — Patient Instructions (Signed)
  [name] , Thank you for taking time to come for your Medicare Wellness Visit. I appreciate your ongoing commitment to your health goals. Please review the following plan we discussed and let me know if I can assist you in the future.   These are the goals we discussed:  Goals      Exercise 3x per week (30 min per time)     - walk for 30 minutes for 3 days/week        This is a list of the screening recommended for you and due dates:  Health Maintenance  Topic Date Due   DTaP/Tdap/Td vaccine (1 - Tdap) Never done   DEXA scan (bone density measurement)  Never done   Zoster (Shingles) Vaccine (2 of 2) 06/07/2017   COVID-19 Vaccine (4 - 2024-25 season) 03/02/2024*   Flu Shot  01/31/2024   Complete foot exam   04/17/2024   Hemoglobin A1C  06/09/2024   Eye exam for diabetics  11/18/2024   Yearly kidney function blood test for diabetes  12/08/2024   Yearly kidney health urinalysis for diabetes  12/09/2024   Medicare Annual Wellness Visit  01/08/2025   Pneumococcal Vaccine for age over 88  Completed   Hepatitis C Screening  Completed   Hepatitis B Vaccine  Aged Out   HPV Vaccine  Aged Out   Meningitis B Vaccine  Aged Out   Colon Cancer Screening  Discontinued  *Topic was postponed. The date shown is not the original due date.

## 2024-01-09 NOTE — Progress Notes (Signed)
 Subjective:   [name] is a 78 y.o. female who presents for Medicare Annual (Subsequent) preventive examination.  Visit Complete: In person  Patient Medicare AWV questionnaire was completed by the patient on 01/09/24; I have confirmed that all information answered by patient is correct and no changes since this date.  Cardiac Risk Factors include: advanced age (>53men, >42 women);dyslipidemia;family history of premature cardiovascular disease;hypertension;obesity (BMI >30kg/m2);sedentary lifestyle     Objective:    Today's Vitals   01/09/24 1025 01/09/24 1041  BP: 112/72   Pulse: 76   Resp: 12   Temp: 97.7 F (36.5 C)   SpO2: 95%   Weight: 166 lb 6.4 oz (75.5 kg)   Height: 5' 2 (1.575 m)   PainSc:  0-No pain   Body mass index is 30.43 kg/m.     09/16/2023    1:29 PM 08/01/2023    9:45 AM 06/11/2023   11:04 AM 04/18/2023    8:14 AM 04/02/2023    8:49 AM 02/01/2023    1:08 PM 01/16/2023   12:59 PM  Advanced Directives  Does Patient Have a Medical Advance Directive? Yes Yes Yes Yes Yes Yes Yes  Type of Estate agent of [place];Out of facility DNR (pink MOST or yellow form);Living will Healthcare Power of [place];Out of facility DNR (pink MOST or yellow form);Living will Healthcare Power of [place];Out of facility DNR (pink MOST or yellow form);Living will Healthcare Power of [place];Living will;Out of facility DNR (pink MOST or yellow form) Healthcare Power of [place];Living will;Out of facility DNR (pink MOST or yellow form) Healthcare Power of [place];Living will;Out of facility DNR (pink MOST or yellow form) Healthcare Power of [place];Living will;Out of facility DNR (pink MOST or yellow form)  Does patient want to make changes to medical advance directive? No - Patient declined  No - Patient declined No - Patient declined No - Patient declined No - Patient declined No - Patient declined  Copy of Healthcare Power of Attorney in Chart? Yes -  validated most recent copy scanned in chart (See row information) Yes - validated most recent copy scanned in chart (See row information) No - copy requested Yes - validated most recent copy scanned in chart (See row information) Yes - validated most recent copy scanned in chart (See row information) Yes - validated most recent copy scanned in chart (See row information) Yes - validated most recent copy scanned in chart (See row information)    Current Medications (verified) Outpatient Encounter Medications as of 01/09/2024  Medication Sig   dabigatran  (PRADAXA ) 150 MG CAPS capsule Take 1 capsule (150 mg total) by mouth 2 (two) times daily.   hydrochlorothiazide  (MICROZIDE ) 12.5 MG capsule TAKE 1 CAPSULE DAILY   lisinopril  (ZESTRIL ) 10 MG tablet Take 1 tablet (10 mg total) by mouth daily.   loperamide  (IMODIUM  A-D) 2 MG tablet Take 1 tablet (2 mg total) by mouth 4 (four) times daily as needed for diarrhea or loose stools.   pantoprazole  (PROTONIX ) 40 MG tablet Take 1 tablet (40 mg total) by mouth daily.   traZODone  (DESYREL ) 50 MG tablet Take 3 tablets (150 mg total) by mouth at bedtime.   [DISCONTINUED] traZODone  (DESYREL ) 50 MG tablet Take 3 tablets (150 mg total) by mouth at bedtime.   albuterol  (VENTOLIN  HFA) 108 (90 Base) MCG/ACT inhaler Inhale 2 puffs into the lungs every 6 (six) hours as needed for wheezing or shortness of breath. (Patient not taking: Reported on 01/09/2024)   potassium chloride  (KLOR-CON ) 10  MEQ tablet Take 1 tablet (10 mEq total) by mouth every Monday, Wednesday, and Friday. (Patient not taking: Reported on 01/09/2024)   primidone  (MYSOLINE ) 50 MG tablet TAKE 1 TABLET TWICE A DAY (Patient not taking: Reported on 01/09/2024)   No facility-administered encounter medications on file as of 01/09/2024.    Allergies (verified) Cephalexin    History: Past Medical History:  Diagnosis Date   Anticoagulated    Pradaxa    Bilateral leg pain    Right leg has greater pain than the  left.   Coronary artery disease cardiologist-- dr kelsie   a.  s/p Xience DES to RCA 04/2009;   b. TEE 2/12: EF 40%, Large PFO;  c.  Lexiscan  Myoview 05/2012: EF 69%, no ischemia. LHC (05/2012):  Ostial diagonal 30-40%, proximal mid ramus intermedius 40-50%, RCA stent patent with 40-50% after stent, then 40%, distal RCA 40-50%, EF 55-65%. Medical therapy continued.;  d.  LexiScan  Myoview (06/2013):  No ischemia, EF 83%, normal study   Full dentures    GERD (gastroesophageal reflux disease)    History of basal cell carcinoma excision    scalp   History of hiatal hernia    History of kidney stones 51 years ago   x1   History of recurrent UTIs    Hypertension    IFG (impaired fasting glucose)    MVP (mitral valve prolapse)    mild per last echo 04-03-2017 in epic   OA (osteoarthritis)    knees , fingers   PAF (paroxysmal atrial fibrillation) [facility])    cardiologist-- dr kelsie   S/P ablation of atrial fibrillation 08/15/2010   S/P drug eluting coronary stent placement 04/04/2009   Spinal stenosis    Tremor    Wears glasses    Past Surgical History:  Procedure Laterality Date   ABDOMINAL EXPOSURE N/A 05/11/2020   Procedure: ABDOMINAL EXPOSURE;  Surgeon: Oris Krystal FALCON, MD;  Location: [facility] OR;  Service: Vascular;  Laterality: N/A;   ANAL RECTAL MANOMETRY N/A 01/11/2014   Procedure: ANAL RECTAL MANOMETRY;  Surgeon: Bernarda Ned, MD;  Location: [facility] ENDOSCOPY;  Service: Endoscopy;  Laterality: N/A;   APPENDECTOMY  1973   BACK SURGERY     laminectomy times 2; 2000 and 2001   BUNIONECTOMY Bilateral 2013   CARDIAC CATHETERIZATION  10-24-2010   dr allred   singl-vessel CAD with patent stent mRCA/  moderate disease mRCA beyond stent segment/  normal lvsf   CARDIAC ELECTROPHYSIOLOGY MAPPING AND ABLATION  08-15-2010  dr allred   CARDIOVASCULAR STRESS TEST  06-15-2013  dr allred   normal perfusion study/  no ischemia/  ef 83%   CARPAL TUNNEL RELEASE Right 25 years ago   CATARACT EXTRACTION W/  INTRAOCULAR LENS  IMPLANT, BILATERAL     CHOLECYSTECTOMY OPEN  1973   W/  APPENDECTOMY   CORONARY ANGIOPLASTY WITH STENT PLACEMENT  04-04-2009  dr wolm brodie   PCI and DES x1 to  mRCA/  mLAD 40%, pCX 30%, pRCA 30%/  normal LVF   FLEXIBLE SIGMOIDOSCOPY N/A 07/07/2013   Procedure: FLEXIBLE SIGMOIDOSCOPY;  Surgeon: Lynwood LITTIE Celestia Mickey., MD;  Location: [facility];  Service: Endoscopy;  Laterality: N/A;  unprepped   LEFT HEART CATH AND CORONARY ANGIOGRAPHY N/A 10/26/2019   Procedure: LEFT HEART CATH AND CORONARY ANGIOGRAPHY;  Surgeon: [place], Peter M, MD;  Location: [facility] INVASIVE CV LAB;  Service: Cardiovascular;  Laterality: N/A;   LEFT HEART CATHETERIZATION WITH CORONARY ANGIOGRAM N/A 05/09/2012   Procedure: LEFT HEART CATHETERIZATION WITH CORONARY ANGIOGRAM;  Surgeon: Debby JONETTA Como, MD;  Location: [facility] CATH LAB;  Service: Cardiovascular;  Laterality: N/A;   MOHS SURGERY  2009   scalp   PILONIDAL CYST EXCISION  2009;   2000;   1999   RECTAL ULTRASOUND N/A 01/11/2014   Procedure: RECTAL ULTRASOUND;  Surgeon: Bernarda Debby, MD;  Location: [facility];  Service: Endoscopy;  Laterality: N/A;   TOTAL KNEE ARTHROPLASTY Right 09/07/2014   Procedure: RIGHT TOTAL KNEE ARTHROPLASTY;  Surgeon: Donnice Car, MD;  Location: [facility] ORS;  Service: Orthopedics;  Laterality: Right;   TOTAL KNEE ARTHROPLASTY Left 12/20/2015   Procedure: LEFT TOTAL KNEE ARTHROPLASTY;  Surgeon: Donnice Car, MD;  Location: [facility] ORS;  Service: Orthopedics;  Laterality: Left;   TRANSTHORACIC ECHOCARDIOGRAM  04-03-2017   dr allred   elonda 55-60%/  mild MVP anterior leaflet (valve area 2.1cm^2) with mild regurg. , no stenosis/  trivial TR and PR   VAGINAL HYSTERECTOMY  1975   W/ BILATERAL SALPINOOPHOORECTOMY   VAGUS NERVE STIMULATOR INSERTION N/A 03/10/2014   Procedure: IMPLANTATION OF SACRAL  NERVE STIMULATOR ;  Surgeon: Bernarda Debby, MD;  Location: [facility];  Service: General;  Laterality: N/A;  Sacral. [employer]   Family  History  Problem Relation Age of Onset   Heart disease Mother    Coronary artery disease Father    Heart disease Sister    Heart disease Brother    Colon cancer Neg Hx    Esophageal cancer Neg Hx    Rectal cancer Neg Hx    Stomach cancer Neg Hx    Social History   Socioeconomic History   Marital status: Married    Spouse name: Not on file   Number of children: 2   Years of education: HS   Highest education level: GED or equivalent  Occupational History   Occupation: retired    Associate Professor: RETIRED  Tobacco Use   Smoking status: Former    Current packs/day: 0.00    Average packs/day: 0.5 packs/day for 20.0 years (10.0 ttl pk-yrs)    Types: Cigarettes    Start date: 07/02/1968    Quit date: 07/02/1988    Years since quitting: 35.5   Smokeless tobacco: Never  Vaping Use   Vaping status: Never Used  Substance and Sexual Activity   Alcohol use: No   Drug use: Never   Sexual activity: Not Currently    Birth control/protection: Surgical    Comment: 1st intercourse 47 yo-1 partner  Other Topics Concern   Not on file  Social History Narrative   Diet: Left it Blank      Caffeine: Yes      Married, if yes what year: 1962      Do you live in a house, apartment, assisted living, condo, trailer, ect: house      Is it one or more stories: no      How many persons live in your home? 2      Pets: no      Highest level or education completed: High School      Current/Past profession: None      Exercise:      No            Type and how often:  none         Living Will: yes   DNR: Yes   POA/HPOA: Yes      Functional Status:   Do you have difficulty bathing or dressing yourself? No   Do you have  difficulty preparing food or eating? No   Do you have difficulty managing your medications? No   Do you have difficulty managing your finances? No   Do you have difficulty affording your medications? No   Social Drivers of Corporate investment banker Strain: Low Risk   (01/27/2023)   Overall Financial Resource Strain (CARDIA)    Difficulty of Paying Living Expenses: Not hard at all  Food Insecurity: No Food Insecurity (01/27/2023)   Hunger Vital Sign    Worried About Running Out of Food in the Last Year: Never true    Ran Out of Food in the Last Year: Never true  Transportation Needs: No Transportation Needs (01/27/2023)   PRAPARE - Administrator, Civil Service (Medical): No    Lack of Transportation (Non-Medical): No  Physical Activity: Insufficiently Active (01/27/2023)   Exercise Vital Sign    Days of Exercise per Week: 3 days    Minutes of Exercise per Session: 30 min  Stress: No Stress Concern Present (01/27/2023)   [employer] of Occupational Health - Occupational Stress Questionnaire    Feeling of Stress : Not at all  Social Connections: Socially Integrated (01/27/2023)   Social Connection and Isolation Panel    Frequency of Communication with Friends and Family: More than three times a week    Frequency of Social Gatherings with Friends and Family: Three times a week    Attends Religious Services: More than 4 times per year    Active Member of Clubs or Organizations: Yes    Attends Engineer, structural: More than 4 times per year    Marital Status: Married    Tobacco Counseling Counseling given: Not Answered   Clinical Intake:  Pre-visit preparation completed: No  Pain : No/denies pain Pain Score: 0-No pain     BMI - recorded: 30.25 Nutritional Status: BMI > 30  Obese Nutritional Risks: None Diabetes: No  How often do you need to have someone help you when you read instructions, pamphlets, or other written materials from your doctor or pharmacy?: 1 - Never What is the last grade level you completed in school?: gRADE 11 AND COMPLETED ged  Interpreter Needed?: No  Information entered by :: Sekou Zuckerman Medina-Vargas DNP   Activities of Daily Living    01/09/2024   10:48 AM  In your present state of  health, do you have any difficulty performing the following activities:  Hearing? 0  Vision? 0  Difficulty concentrating or making decisions? 0  Walking or climbing stairs? 0  Dressing or bathing? 0  Doing errands, shopping? 0  Using the Toilet? N  In the past six months, have you accidently leaked urine? N  Do you have problems with loss of bowel control? Y  Managing your Medications? N  Managing your Finances? N  Housekeeping or managing your Housekeeping? N    Patient Care Team: Medina-Vargas, Jereld BROCKS, NP as PCP - General (Internal Medicine) Cindie Ole DASEN, MD as PCP - Electrophysiology (Cardiology) Burnetta Aures, MD as Consulting Physician (Orthopedic Surgery) Pyrtle, Gordy HERO, MD as Consulting Physician (Gastroenterology)  Indicate any recent Medical Services you may have received from other than [facility] providers in the past year (date may be approximate).     Assessment:   This is a routine wellness examination for [name].  Hearing/Vision screen Hearing Screening - Comments:: Patient stated hearing is great  Vision Screening - Comments:: Vision is great   Goals Addressed  This Visit's Progress    Exercise 3x per week (30 min per time)       - walk for 30 minutes for 3 days/week       Depression Screen    12/09/2023   10:54 AM 09/16/2023    1:28 PM 08/01/2023    9:45 AM 05/23/2023    1:20 PM 02/01/2023    1:08 PM  PHQ 2/9 Scores  PHQ - 2 Score 0 0 0 0 0  PHQ- 9 Score 0 0       Fall Risk    12/09/2023   10:54 AM 09/16/2023    1:28 PM 08/01/2023    9:45 AM 06/11/2023   11:04 AM 05/23/2023    1:19 PM  Fall Risk   Falls in the past year? 0 0 0 0 0  Number falls in past yr: 0 0 0 0 0  Injury with Fall? 0 0 0 0 0  Risk for fall due to : No Fall Risks No Fall Risks  No Fall Risks No Fall Risks  Follow up Falls evaluation completed   Falls evaluation completed;Education provided;Falls prevention discussed Falls evaluation completed;Education  provided;Falls prevention discussed    MEDICARE RISK AT HOME: Medicare Risk at Home Any stairs in or around the home?: Yes If so, are there any without handrails?: No Home free of loose throw rugs in walkways, pet beds, electrical cords, etc?: Yes Adequate lighting in your home to reduce risk of falls?: Yes Life alert?: No Use of a cane, walker or w/c?: No Grab bars in the bathroom?: No Shower chair or bench in shower?: Yes Elevated toilet seat or a handicapped toilet?: Yes  TIMED UP AND GO:  Was the test performed?  Yes  Length of time to ambulate 10 feet: <10 sec Gait slow and steady without use of assistive device    Cognitive Function:        01/09/2024   10:02 AM  6CIT Screen  What Year? 0 points  What month? 0 points  What time? 0 points  Count back from 20 0 points  Months in reverse 0 points    Immunizations Immunization History  Administered Date(s) Administered   Fluad Trivalent(High Dose 65+) 03/01/2023   Influenza Split 03/27/2012, 04/08/2013, 05/04/2014   Influenza, High Dose Seasonal PF 04/01/2017, 03/28/2018   Influenza,inj,Quad PF,6-35 Mos 04/17/2015   Influenza-Unspecified 03/27/2012, 04/08/2013, 05/04/2014, 04/17/2015, 04/01/2017, 03/22/2022   PFIZER(Purple Top)SARS-COV-2 Vaccination 07/23/2019, 08/13/2019   Pneumococcal Conjugate-13 09/30/2017   Pneumococcal Polysaccharide-23 04/27/2010   Unspecified SARS-COV-2 Vaccination 03/22/2022   Zoster Recombinant(Shingrix) 04/12/2017   Zoster, Live 05/11/2010    TDAP status: Due, Education has been provided regarding the importance of this vaccine. Advised may receive this vaccine at local pharmacy or Health Dept. Aware to provide a copy of the vaccination record if obtained from local pharmacy or Health Dept. Verbalized acceptance and understanding.  Flu Vaccine status: Up to date  Pneumococcal vaccine status: Up to date  Covid-19 vaccine status: Information provided on how to obtain vaccines.    Qualifies for Shingles Vaccine? Yes   Zostavax completed Yes   Shingrix Completed?: Yes  Screening Tests Health Maintenance  Topic Date Due   DTaP/Tdap/Td (1 - Tdap) Never done   DEXA SCAN  Never done   Zoster Vaccines- Shingrix (2 of 2) 06/07/2017   COVID-19 Vaccine (4 - 2024-25 season) 03/02/2024 (Originally 03/03/2023)   INFLUENZA VACCINE  01/31/2024   FOOT EXAM  04/17/2024   HEMOGLOBIN A1C  06/09/2024  OPHTHALMOLOGY EXAM  11/18/2024   Diabetic kidney evaluation - eGFR measurement  12/08/2024   Diabetic kidney evaluation - Urine ACR  12/09/2024   Medicare Annual Wellness (AWV)  01/08/2025   Pneumococcal Vaccine: 50+ Years  Completed   Hepatitis C Screening  Completed   Hepatitis B Vaccines  Aged Out   HPV VACCINES  Aged Out   Meningococcal B Vaccine  Aged Out   Colonoscopy  Discontinued    Health Maintenance  Health Maintenance Due  Topic Date Due   DTaP/Tdap/Td (1 - Tdap) Never done   DEXA SCAN  Never done   Zoster Vaccines- Shingrix (2 of 2) 06/07/2017    Colorectal cancer screening: Type of screening: Colonoscopy. Completed 05/07/22. Repeat every 0 years Mammogram status: No longer required due to aged out.  Bone Density status: Ordered but insurance not covering. Pt provided with contact info and advised to call to schedule appt.  Lung Cancer Screening: (Low Dose CT Chest recommended if Age 7-80 years, 20 pack-year currently smoking OR have quit w/in 15years.) does not qualify.   Lung Cancer Screening Referral: N/A  Additional Screening:  Hepatitis C Screening: does qualify; Completed 02/01/23  Vision Screening: Recommended annual ophthalmology exams for early detection of glaucoma and other disorders of the eye. Is the patient up to date with their annual eye exam?  Yes  Who is the provider or what is the name of the office in which the patient attends annual eye exams? Dr. Camillo If pt is not established with a provider, would they like to be referred to a  provider to establish care? No .   Dental Screening: Recommended annual dental exams for proper oral hygiene  Diabetic Foot Exam: Diabetic Foot Exam: Overdue, Pt has been advised about the importance in completing this exam. Pt is scheduled for diabetic foot exam on next visit.  Community Resource Referral / Chronic Care Management: CRR required this visit?  No   CCM required this visit?  No     Plan:     I have personally reviewed and noted the following in the patient's chart:   Medical and social history Use of alcohol, tobacco or illicit drugs  Current medications and supplements including opioid prescriptions. Patient is not currently taking opioid prescriptions. Functional ability and status Nutritional status Physical activity Advanced directives List of other physicians Hospitalizations, surgeries, and ER visits in previous 12 months Vitals Screenings to include cognitive, depression, and falls Referrals and appointments  In addition, I have reviewed and discussed with patient certain preventive protocols, quality metrics, and best practice recommendations. A written personalized care plan for preventive services as well as general preventive health recommendations were provided to patient.     Zuleyma Scharf Medina-Vargas, NP   01/09/2024   After Visit Summary: (In Person-Printed) AVS printed and given to the patient  Nurse Notes: Needs to be done annually.

## 2024-01-10 ENCOUNTER — Telehealth: Payer: Self-pay

## 2024-01-10 NOTE — Telephone Encounter (Signed)
 Copied from CRM [id number]. Topic: General - Other >> Jan 10, 2024  2:09 PM [name] wrote: Patient [name] calling would like a call back from provider or nurse regarding a dx that was put on her after visit summary Cognitive Impairment.   Please advise

## 2024-01-10 NOTE — Telephone Encounter (Unsigned)
 Copied from CRM [id number]. Topic: General - Other >> Jan 10, 2024  2:09 PM [name] wrote: Patient [name] calling would like a call back from provider or nurse regarding a dx that was put on her after visit summary Cognitive Impairment.   Please advise >> Jan 10, 2024  2:40 PM Miquel SAILOR wrote: Patient stated had visit on 07/10 and thy stated she is Cognitive Impairment. . Patient disagrees and stated she did not listen to her. Request to speak with office. Called and transferred to office

## 2024-01-10 NOTE — Telephone Encounter (Signed)
 I will called patient

## 2024-01-10 NOTE — Telephone Encounter (Signed)
 Patient called back and asked the same questions again. I have explained numerous times why diagnosis is in patient chart. I cannot stay overtime and speak on the phone for another . I had to express to patient again that I have to get off the phone.

## 2024-01-10 NOTE — Telephone Encounter (Signed)
 Copied from CRM [id number]. Topic: General - Other >> Jan 10, 2024 10:00 AM Adrianna P wrote: Reason for CRM: patient needs dr visit notes from 7/10 explained to her, Please call [phone]

## 2024-01-10 NOTE — Telephone Encounter (Signed)
 I called patient back and she states that she was only in the room with Monina Vargas, NP for and she didn't do anything but type on her computer. She states that cognitive impairment was something Monina, NP made up and added to her chart. I advised patient that these statement aren't true. I was sitting at my desk and Jereld Delude, NP spoke to both your husband and you for if not more. She states that Jereld Delude, NP is the only person she ever spoke to yesterday in office. Which also wasn't true because the Medical Assistant Crystal Lawns.G roomed both her and her husband before the Nurse Practitioner went in. Patient didn't remember being roomed by MA. Patient began to say and I quote You just think that I'm crazy!. I denied this allegation and reassured patient that I have never said that nor do I even think that of her. Patient continued to fuss/ramble about the same questions and her husband was heard in the background saying YEAH!, after every remark the patient made. I was on the phone for about before I expressed I had to get off the phone to attend my other in office responsibilities. Message routed to PCP Medina-Vargas, Monina C, NP as FYI.

## 2024-01-12 NOTE — Telephone Encounter (Signed)
 Cognitive impairment was needed to be written in there in order to apply for the VA Disability so you can avail of the help/extra assistance.

## 2024-01-13 NOTE — Telephone Encounter (Signed)
 Spoke with patient for 19 minutes, she repeatedly asked the same questions, and I repeatedly gave patient the same answer, that was also sent to her via mychart.   Patient ended the conversation by saying We are not planning on coming back to that office, we were happier with the [facility] doctors and staff. Patient hung up on me.

## 2024-01-13 NOTE — Telephone Encounter (Signed)
 Copied from CRM [id number]. Topic: General - Other >> Jan 10, 2024  2:09 PM [name] wrote: Patient [name] calling would like a call back from provider or nurse regarding a dx that was put on her after visit summary Cognitive Impairment.   Please advise >> Jan 13, 2024 11:47 AM [name] wrote: Patient calling back reason the messages in mychart regarding getting veteran benefits and after visit summary Cognitive Impairment >> Jan 10, 2024  3:15 PM [name] wrote: She also has questions about her awv. She does not want to participate in those >> Jan 10, 2024  3:05 PM [name] wrote: Patient called again , she is not understanding the diagnosis, please have [name] call her at [phone] >> Jan 10, 2024  2:40 PM [name] SAILOR wrote: Patient stated had visit on 07/10 and thy stated she is Cognitive Impairment. . Patient disagrees and stated she did not listen to her. Request to speak with office. Called and transferred to office

## 2024-02-03 ENCOUNTER — Other Ambulatory Visit: Payer: Self-pay | Admitting: Adult Health

## 2024-02-03 DIAGNOSIS — I480 Paroxysmal atrial fibrillation: Secondary | ICD-10-CM

## 2024-02-04 ENCOUNTER — Other Ambulatory Visit: Payer: Self-pay

## 2024-02-04 ENCOUNTER — Telehealth: Payer: Self-pay

## 2024-02-04 DIAGNOSIS — I480 Paroxysmal atrial fibrillation: Secondary | ICD-10-CM

## 2024-02-04 MED ORDER — DABIGATRAN ETEXILATE MESYLATE 150 MG PO CAPS: 150.0000 mg | ORAL_CAPSULE | Freq: Two times a day (BID) | ORAL | 3 refills | Status: DC

## 2024-02-04 NOTE — Telephone Encounter (Signed)
 RX resubmitted and I have added in the patient sig to dispense Brand Name only

## 2024-02-04 NOTE — Telephone Encounter (Signed)
 Incoming fax received from patients pharmacy to initiate a prior authorization for   pradaxa                 .  PA initiated through covermymeds. Key: [id number]  Awaiting reply from the insurance company which will be determined in 48-72 hours.

## 2024-02-04 NOTE — Telephone Encounter (Signed)
 Patient called to question the status of pradaxa  rx. Patient was informed that rx was submitted, we completed prior authorization this morning and waiting to hear from the insurance company as to whether or not they will cover. Patient states rx should be listed on her medication list as the brand name only because a generic formulation is not available.  Patient was assured again that we completed PA. Patient states she has 3 pills and will run out before medication delivered from [facility], I offered to send rx to local pharmacy to avoid running out and patient refused stating that it will cost her double what she has to pay through mail order.

## 2024-02-04 NOTE — Addendum Note (Signed)
 Addended by: SUELLEN DEVIN BROCKS on: 02/04/2024 11:54 AM   Modules accepted: Orders

## 2024-02-21 ENCOUNTER — Telehealth: Payer: Self-pay

## 2024-02-21 DIAGNOSIS — I480 Paroxysmal atrial fibrillation: Secondary | ICD-10-CM

## 2024-02-21 MED ORDER — DABIGATRAN ETEXILATE MESYLATE 150 MG PO CAPS: 150.0000 mg | ORAL_CAPSULE | Freq: Two times a day (BID) | ORAL | 3 refills | Status: DC

## 2024-02-21 NOTE — Telephone Encounter (Signed)
 Message routed to covering provider Harlene Dancer, NP due to PCP Medina-Vargas, Jereld BROCKS, NP being out of office.

## 2024-02-21 NOTE — Telephone Encounter (Signed)
 Copied from CRM [id number]. Topic: Clinical - Prescription Issue >> Feb 21, 2024  9:58 AM [name] wrote: Reason for CRM: Patient states that the generic prescription of dabigatran  (PRADAXA ) 150 MG CAPS capsule was denied. Patient states her insurance will only approve Brand name medications Please advise patient on next steps. Please contact patient at [phone]

## 2024-02-21 NOTE — Telephone Encounter (Signed)
 Please call the pharmacy and confirm this and clarify if we need to take any additional steps.

## 2024-02-21 NOTE — Telephone Encounter (Signed)
 I have called [facility] and they stated that they never told patient this. They also stated that is doesn't matter if its generic or brand name patient should be able to receive medication, and has been receiving medication without any issues. However they stated that patient is due for refill. Jereld Delude, NP has sent refill 02/04/2024. Provider even has it noted in refill BRAND NAME ONLY. I called patient and notified her of what pharmacy said, and she stated that she only asked for refill to being with, and that she was sent a message from her pharmacy stating that prescription wouldn't be covered unless brand name. Again I reassured patient that medication will be received either way and that we will go ahead again and process her refill now. She didn't have any other concerns or comments. Message routed back to Harlene An, NP.

## 2024-02-21 NOTE — Telephone Encounter (Signed)
 Noted thank you

## 2024-03-06 ENCOUNTER — Other Ambulatory Visit: Payer: Self-pay | Admitting: Adult Health

## 2024-03-06 DIAGNOSIS — R251 Tremor, unspecified: Secondary | ICD-10-CM

## 2024-03-06 NOTE — Telephone Encounter (Signed)
 Copied from CRM [id number]. Topic: Clinical - Medication Refill >> Mar 06, 2024  2:59 PM Zane F wrote: Patient is calling in to refill the listed prescription. Patient has contacted [facility] who have informed her they have sent in the prescription but have not heard back.  Callback Number: [phone]   Medication: primidone  (MYSOLINE ) 50 MG tablet   Has the patient contacted their pharmacy? Yes  This is the patient's preferred pharmacy:  [facility] - [place], [place] - [address] [place] [address] Phone: [phone] Fax: [phone]    Is this the correct pharmacy for this prescription? YES   Has the prescription been filled recently? No  Is the patient out of the medication? No  Has the patient been seen for an appointment in the last year OR does the patient have an upcoming appointment? Yes  Can we respond through MyChart? No, patient prefers a call  Agent: Please be advised that Rx refills may take up to 3 business days. We ask that you follow-up with your pharmacy.

## 2024-03-10 MED ORDER — PRIMIDONE 50 MG PO TABS: 50.0000 mg | ORAL_TABLET | Freq: Two times a day (BID) | ORAL | 1 refills | Status: DC

## 2024-03-12 ENCOUNTER — Ambulatory Visit: Admitting: Adult Health

## 2024-03-27 ENCOUNTER — Other Ambulatory Visit: Payer: Self-pay | Admitting: Adult Health

## 2024-04-03 ENCOUNTER — Ambulatory Visit: Payer: Self-pay

## 2024-04-03 DIAGNOSIS — I10 Essential (primary) hypertension: Secondary | ICD-10-CM

## 2024-04-03 NOTE — Telephone Encounter (Signed)
 FYI Only or Action Required?: FYI only for provider.  Patient was last seen in primary care on 01/09/2024 by Medina-Vargas, Jereld BROCKS, NP.  Called Nurse Triage reporting Medical Question.  Symptoms began n/a.  Interventions attempted: Other: n/a.  Symptoms are: n/a.  Triage Disposition: Information or Advice Only Call  Patient/caregiver understands and will follow disposition?: Yes Reason for Disposition  General information question, no triage required and triager able to answer question  Answer Assessment - Initial Assessment Questions Patient stating her and her husband were at the [place] and were told they have diagnosis of dementia on chart. Advised patient there is no diagnosis of dementia on file and not sure where that came from.  1. REASON FOR CALL: What is the main reason for your call? or How can I best help you?     Patient calling asking if she or her husband have a diagnosis of Dementia on their charts   2. SYMPTOMS : Do you have any symptoms?      No  Protocols used: Information Only Call - No Triage-A-AH  Copied from CRM [id number]. Topic: General - Other >> Apr 03, 2024  5:27 PM DeAngela L wrote: Reason for CRM: patient calling cause she has a diagnosis of dementia in her chart and wants to talk with a nurse   Pt num [phone] (M)

## 2024-04-06 NOTE — Telephone Encounter (Signed)
 Copied from CRM [id number]. Topic: General - Other >> Apr 03, 2024  5:05 PM DeAngela L wrote: Reason for CRM: patient called to ask what a provider put into her chart and there are not any new notes listed, patient states [facility] appointment [facility] and would like to know who added a diagnosis to her chart for the VA to read a diagnosis from the provider to her    Pt num [phone] (M)

## 2024-04-07 ENCOUNTER — Ambulatory Visit: Payer: Self-pay

## 2024-04-07 ENCOUNTER — Telehealth: Payer: Self-pay | Admitting: Adult Health

## 2024-04-07 ENCOUNTER — Other Ambulatory Visit: Payer: Self-pay | Admitting: Adult Health

## 2024-04-07 DIAGNOSIS — R251 Tremor, unspecified: Secondary | ICD-10-CM

## 2024-04-07 MED ORDER — HYDROCHLOROTHIAZIDE 12.5 MG PO CAPS: 12.5000 mg | ORAL_CAPSULE | Freq: Every day | ORAL | 1 refills | Status: DC

## 2024-04-07 MED ORDER — HYDROCHLOROTHIAZIDE 12.5 MG PO CAPS
12.5000 mg | ORAL_CAPSULE | Freq: Every day | ORAL | 1 refills | Status: DC
Start: 2024-04-07 — End: 2024-04-07

## 2024-04-07 NOTE — Telephone Encounter (Signed)
 Noted

## 2024-04-07 NOTE — Telephone Encounter (Addendum)
 Outgoing call placed to patient, no answer, and  voicemail full. We will need to make additional attempts to reach patient. Reason for call was to share providers response below (can be shared by E2C2 agent, if call returned)

## 2024-04-07 NOTE — Telephone Encounter (Signed)
 Diagnoses were copied from previous records.

## 2024-04-07 NOTE — Telephone Encounter (Signed)
 Form states examiner MUST be a Proofreader (MD), or Doctor of Osteopathic (OD), physician assistant, or advanced practice registered nursed (NP). Form filled in and placed in providers review and sign folder for completion

## 2024-04-07 NOTE — Telephone Encounter (Signed)
 Diagnoses were obtained from [facility] records. It is important that she do mental exercises like crossword puzzles, reading, puzzles, exercise, eating vegetables, continue going out daily, sleep at least 7-8 hours daily, etc

## 2024-04-07 NOTE — Addendum Note (Signed)
 Addended by: SUELLEN DEVIN BROCKS on: 04/07/2024 03:14 PM   Modules accepted: Orders

## 2024-04-07 NOTE — Telephone Encounter (Signed)
 Spoke with patient:  1.)  Patient stated be stated what records? (Patient asked that you be specific)  2.) Patient also would like to know what does a diagnosis of dementia mean? What is there to do about it?

## 2024-04-07 NOTE — Telephone Encounter (Addendum)
 Medina-Vargas, Monina C, NP to [facility] (Selected Message)    04/07/24 11:47 AM Note Diagnoses were obtained from [facility] records. It is important that she do mental exercises like crossword puzzles, reading, puzzles, exercise, eating vegetables, continue going out daily, sleep at least 7-8 hours daily, etc      Patient aware of providers response and said she needs Monina to call her directly to discuss this.   Patient also asked that I send her hydrochlorothiazide  to [facility], rx would not submit electronically so I manually faxed to [facility]

## 2024-04-07 NOTE — Telephone Encounter (Signed)
 Patient son Franky dropped off paper work to be completed ([facility]) ask for a call back when done. Given to Chrae in CI.SABRA

## 2024-04-07 NOTE — Telephone Encounter (Unsigned)
 Copied from CRM [id number]. Topic: Clinical - Medication Refill >> Apr 07, 2024  5:40 PM Chiquita SQUIBB wrote: Medication: primidone  (MYSOLINE ) 50 MG tablet [[id number]]  Has the patient contacted their pharmacy? Yes (Agent: If no, request that the patient contact the pharmacy for the refill. If patient does not wish to contact the pharmacy document the reason why and proceed with request.) (Agent: If yes, when and what did the pharmacy advise?)  This is the patient's preferred pharmacy:  [facility] - [place], [place] - [address] [place] [address] Phone: [phone] Fax: [phone]  Is this the correct pharmacy for this prescription? Yes If no, delete pharmacy and type the correct one.   Has the prescription been filled recently? No  Is the patient out of the medication? Yes  Has the patient been seen for an appointment in the last year OR does the patient have an upcoming appointment? Yes  Can we respond through MyChart? Yes  Agent: Please be advised that Rx refills may take up to 3 business days. We ask that you follow-up with your pharmacy.

## 2024-04-07 NOTE — Telephone Encounter (Signed)
 This RN attempted to contact patient. No answer. LVM. Will route to office for further guidance. Noted that medication was sent to pharmacy per Patient Access Specialist.

## 2024-04-08 ENCOUNTER — Other Ambulatory Visit: Payer: Self-pay | Admitting: Adult Health

## 2024-04-08 DIAGNOSIS — R251 Tremor, unspecified: Secondary | ICD-10-CM

## 2024-04-08 MED ORDER — PRIMIDONE 50 MG PO TABS: 50.0000 mg | ORAL_TABLET | Freq: Two times a day (BID) | ORAL | 1 refills | Status: AC

## 2024-04-08 NOTE — Telephone Encounter (Signed)
 Please see previous encounter

## 2024-04-08 NOTE — Telephone Encounter (Signed)
 Copied from CRM #[id number]. Topic: Clinical - Medication Question >> Apr 07, 2024  5:45 PM Chiquita SQUIBB wrote: Reason for CRM: Patient is calling in stating that she has called multiple times regarding her  hydrochlorothiazide  (MICROZIDE ) 12.5 MG capsule being sent to a local pharmacy and also her primidone  (MYSOLINE ) 50 MG tablet being filled. I am not seeing any messages regarding these, I did submit the Primidone  for a refill for the patient while on the phone. The patient stated she has called multiple times and Monina is ignoring her and her concerns about her medications and her diarrhea. A pink word CRM was sent for diarrhea to NT while also on the phone with the patient. Patient would still like a call back for the NP to discuss her concerns when the office is back open tomorrow.

## 2024-04-08 NOTE — Telephone Encounter (Signed)
 RX was approved on 03/10/24, 180 with 1 refill

## 2024-04-08 NOTE — Telephone Encounter (Signed)
 Message routed to PCP Medina-Vargas, Monina C, NP

## 2024-04-08 NOTE — Telephone Encounter (Signed)
 Called patient over the phone and discussed regarding refills. Records show that her Primidone  180 tab with 1 refill on 03/10/24. She said that she did not receive it and probably was just thrown on her porch and somebody has taken it. Ordered another 180 tabs with 1 refill of Primidone . Miicrozide refill was sent yesterday. These were discussed with patient.

## 2024-04-09 ENCOUNTER — Other Ambulatory Visit: Payer: Self-pay | Admitting: *Deleted

## 2024-04-09 MED ORDER — PANTOPRAZOLE SODIUM 40 MG PO TBEC: 40.0000 mg | DELAYED_RELEASE_TABLET | Freq: Every day | ORAL | 1 refills | Status: DC

## 2024-04-09 NOTE — Telephone Encounter (Signed)
 Patient son, Franky called requested refill to be sent to [facility].

## 2024-04-13 ENCOUNTER — Other Ambulatory Visit: Payer: Self-pay | Admitting: Adult Health

## 2024-04-13 ENCOUNTER — Telehealth: Payer: Self-pay

## 2024-04-13 ENCOUNTER — Ambulatory Visit: Payer: Self-pay

## 2024-04-13 DIAGNOSIS — R4189 Other symptoms and signs involving cognitive functions and awareness: Secondary | ICD-10-CM

## 2024-04-13 NOTE — Telephone Encounter (Signed)
 Renee Moats, RN to [facility]      04/13/24  2:28 PM Family does not think she will go.

## 2024-04-13 NOTE — Telephone Encounter (Addendum)
 FYI Only or Action Required?: Action required by provider: update on patient condition.  Patient was last seen in primary care on 01/09/2024 by Medina-Vargas, Jereld BROCKS, NP.  Called Nurse Triage reporting Dementia.  Symptoms began several days ago.  Interventions attempted: Nothing.  Symptoms are: gradually worsening.  Triage Disposition: Go to ED Now (Notify PCP)  Patient/caregiver understands and will follow disposition?: No  Family does not believe she will agree to go.  CAL notified  Copied from CRM [id number]. Topic: Clinical - Red Word Triage >> Apr 13, 2024  2:02 PM Diannia H wrote: Red Word that prompted transfer to Nurse Triage: Patients son called and the patient is having a nervous breakdown, she is no self harm, she is freaking out about her bank account. She is at home with her husband and he can't control her and she's calling people cursing, her son, her husband. The son is concerned because she is getting worse. This is day 4 he's been dealing with this and each day its getting worse. He thinks her mind is just gone. Reason for Disposition  Severe agitation or behavior problem (e.g., patient endangering self or others)  Answer Assessment - Initial Assessment Questions Pt's son called pt is becoming very verbally abusive to the rest of the family. He states she has dementia and forgot to update her card and was trying to order something on guam and something happened with their bank account that caused this most recent mental break. He states that she has been throwing things at her family. Just while on the phone with the son and daughter she texted the daughter 8-10 times many of which were the same or similar messages. He states that she won't go to the ER and thinks she's cognative enough to say no. RN advised if he continues to call 911 to get her evaluated.    1. MAIN CONCERN OR SYMPTOM:  What is your main concern right now? What questions do you have? What's the  main symptom you're worried about? (e.g., confusion, memory loss)     anger 2. ONSET:  When did the symptom start (or worsen)? (minutes, hours, days, weeks)     Worsened on Thursday 3. BETTER-SAME-WORSE: Are you (the patient) getting better, staying the same, or getting worse compared to the day you (they) were diagnosed or most recent hospital discharge?     worse 4. DIAGNOSIS: Was the dementia diagnosed by a doctor? If Yes, ask: When? (e.g., days, months, years ago)     yes  7. SUPPORT: What type of support do you (the patient) have? Note: Document living circumstances and support (e.g., family, nursing home).     Husband, children  Protocols used: Dementia Symptoms and Questions-A-AH

## 2024-04-13 NOTE — Telephone Encounter (Signed)
 Spoke with patient over the phone because patient's son is very concern about his parent's care and was angry he stated that our office called his mother which is stated that He does not want our office to call his parent and that needs help with their care. He also Stated that the patient's mother has been more aggressive because she does have dementia and He would like to take guarding ship over his parents along with sister. I have spoken with Medina-Vargas, Monina C, NP to relate the message that the son would like to speak to Medina-Vargas, Monina C, NP for advice which has agree to speak to the patient son and The would like the office to call him phone for any concerns the number is [phone] and not call the patient directly.  Medina-Vargas, Monina C, NP has been notified.   Message sent to Medina-Vargas, Monina C, NP

## 2024-04-13 NOTE — Telephone Encounter (Signed)
 I have not called patient. FYI.

## 2024-04-13 NOTE — Telephone Encounter (Signed)
 Noted

## 2024-04-13 NOTE — Telephone Encounter (Signed)
 Copied from CRM [id number]. Topic: General - Other >> Apr 13, 2024  3:09 PM Suzette B wrote: Reason for CRM: Patient Ms. Privette called from [phone], she states its been 8 days and the clinic keeps calling and she doesn't know why or what they are needing at this time. Pt states someone from the office always calls and never leaves a message. Please call son to advise him of the pts call.   Ohlinger,KEVIN (EC) [phone] (Mobile)

## 2024-04-13 NOTE — Telephone Encounter (Signed)
@   4:51 PM Called son Kierah Goatley. Son was was very upset that his mother is getting calls from [facility] staff. He said that his mother gets so worked up when [facility] staff calls her. He wants to get the first call regarding his mother and father. His mother has been having a psychotic episode. Patient has been calling him and his sister who is in [place] . He said that patient gets verbally abusive to her husband. She has screwed up the bank account and son has to fix it. Advised the son to bring patient to the ED if behavior is uncontrollable. He said that patient will refuse to go to ED once paramedics asked her will not be able to take her. Kevin's daughter is a Engineer, civil (consulting) and suggested that patient be given Seroquel. Franky declines medication because patient wouldn't take it anyway. He said that he is in the process of getting a legal guardianship on both his parents. He said that it is hard on him to go to the magistrate to get the full legal guardianship at this time. 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 He said that he has talked on the phone with [employer] and [employer]. Informed him that these are the medical answering services. He was upset that I don't know who are these people. He requested for medication management and agreed to have a Home health nurse to go their house to help patient with her medication intake.

## 2024-04-13 NOTE — Telephone Encounter (Signed)
 Message routed to PCP Medina-Vargas, Monina C, NP as FYI.

## 2024-04-13 NOTE — Telephone Encounter (Signed)
 Nobody has called patient in the office. Previous message with patient concerns routed to PCP Medina-Vargas, Monina C, NP

## 2024-04-15 ENCOUNTER — Telehealth: Payer: Self-pay | Admitting: Adult Health

## 2024-04-15 NOTE — Telephone Encounter (Signed)
 Franky stated to just hold off on everything; her dementia is so bad he can't deal with her aggressiveness. He has no clarity on what he can do to get her to come down.  He stated, Just hold off for now. He is trying to figure out what to do; he is getting guidance from an attorney and a therapist.

## 2024-04-15 NOTE — Telephone Encounter (Signed)
-----   Message from [facility] B sent at 04/14/2024  4:51 PM EDT ----- Regarding: FW: HH Urgent Referral  ----- Message ----- From: Phyllis Jereld BROCKS, NP Sent: 04/14/2024   4:44 PM EDT To: [facility] Subject: FW: HH Urgent Referral                         Pls call son to schedule a face to face visit for Home health referral. ----- Message ----- From: Sherren Lister D Sent: 04/14/2024   4:40 PM EDT To: Monina C Medina-Vargas, NP Subject: [facility] Urgent Referral                             Home Health has to have a face-to-face visit documented per Medicare compliance, within 90 days.  This patient's last visit was 01/09/24.  The government shutdown has caused video visits to be deemed not face-to-face anymore either.  And there has to be a diagnosis, not a symptom. Per chart review, this patient has diagnosis of stroke. Cognitive impairment is not a diagnosis, it is a symptom. It would be a good idea to have one face-to-face covering all of her diagnoses history so that takes care of what she may need for another 90 days.  Let me know if she gets seen. Thanks. Karlynn

## 2024-04-15 NOTE — Telephone Encounter (Signed)
 Noted

## 2024-04-16 NOTE — Telephone Encounter (Signed)
 Son Franky was called to come pick up form

## 2024-04-30 ENCOUNTER — Other Ambulatory Visit: Payer: Self-pay | Admitting: Adult Health

## 2024-04-30 ENCOUNTER — Telehealth: Payer: Self-pay

## 2024-04-30 DIAGNOSIS — Z1231 Encounter for screening mammogram for malignant neoplasm of breast: Secondary | ICD-10-CM

## 2024-04-30 NOTE — Telephone Encounter (Signed)
**Note De-identified  [place] Obfuscation** Please advise 

## 2024-04-30 NOTE — Telephone Encounter (Signed)
 Contacted patients son Franky and his is aware that patients appointment is on 05/22/2024. Informed Kevin that after this appointment she will not need any mammograms unless pt is having symptoms.

## 2024-04-30 NOTE — Telephone Encounter (Signed)
 She has an appointment on 11/21 for her mammogram. Pls call son to remind him of the appointment.

## 2024-04-30 NOTE — Telephone Encounter (Signed)
 Copied from CRM [id number]. Topic: Clinical - Medical Advice >> Apr 30, 2024 10:23 AM [name] wrote: Reason for CRM: Patient states that on her last visit with PCP on July 10 she was advised that a mammogram would be scheduled for her but she's never received a call or appointment for it. She would like to speak to PCP or a nurse to see if she's misremembering or if the mammograms have an age limit

## 2024-05-07 LAB — LIPID PANEL
Cholesterol: 137 mg/dL (ref <200)
HDL: 51 mg/dL (ref >=50)
LDL Cholesterol (Calc): 60 mg/dL
Non-HDL Cholesterol (Calc): 86 mg/dL (ref <130)
Total CHOL/HDL Ratio: 2.7 (calc) (ref <5.0)
Triglycerides: 192 mg/dL — ABNORMAL HIGH (ref <150)

## 2024-05-07 LAB — BASIC METABOLIC PANEL WITHOUT GFR
BUN/Creatinine Ratio: 22 (calc) (ref 6–22)
BUN: 26 mg/dL — ABNORMAL HIGH (ref 7–25)
CO2: 28 mmol/L (ref 20–32)
Calcium: 9 mg/dL (ref 8.6–10.4)
Chloride: 102 mmol/L (ref 98–110)
Creat: 1.16 mg/dL — ABNORMAL HIGH (ref 0.60–1.00)
Glucose, Bld: 101 mg/dL (ref 65–139)
Potassium: 3.3 mmol/L — ABNORMAL LOW (ref 3.5–5.3)
Sodium: 141 mmol/L (ref 135–146)

## 2024-05-07 LAB — HEMOGLOBIN A1C
Hgb A1c MFr Bld: 5.6 % (ref <5.7)
Mean Plasma Glucose: 114 mg/dL
eAG (mmol/L): 6.3 mmol/L

## 2024-05-14 ENCOUNTER — Other Ambulatory Visit: Payer: Self-pay | Admitting: Adult Health

## 2024-05-14 DIAGNOSIS — F5101 Primary insomnia: Secondary | ICD-10-CM

## 2024-05-22 ENCOUNTER — Ambulatory Visit
Admission: RE | Admit: 2024-05-22 | Discharge: 2024-05-22 | Disposition: A | Discharge status: Home / Self Care | Source: Ambulatory Visit | Attending: Adult Health | Admitting: Adult Health

## 2024-05-22 DIAGNOSIS — Z1231 Encounter for screening mammogram for malignant neoplasm of breast: Secondary | ICD-10-CM

## 2024-06-01 ENCOUNTER — Ambulatory Visit: Payer: Self-pay | Admitting: Adult Health

## 2024-06-02 ENCOUNTER — Telehealth: Payer: Self-pay

## 2024-06-02 NOTE — Telephone Encounter (Signed)
 Monina please advise on course of action to take given the information below

## 2024-06-02 NOTE — Telephone Encounter (Signed)
 [name], son, and discussed patient's concern. Krause wants her mother and him get the notifications regarding her medical issues. Krause stated that he has the medical power of attorney and that she has dementia.

## 2024-06-02 NOTE — Telephone Encounter (Signed)
 Copied from CRM #[id number]. Topic: General - Other >> Jun 02, 2024 10:09 AM Debby BROCKS wrote: Reason for CRM: Patient called in stating that her son should not be receiving medical information that pertains to her.  PER PATIENT REQUEST AS OF 09/20/2023 DO NOT RELEASE ANY INFORMATION TO SON, KEVIN. However she states he keeps getting phone calls regarding her medical information and wants it to stop

## 2024-06-08 ENCOUNTER — Telehealth: Payer: Self-pay | Admitting: *Deleted

## 2024-06-08 NOTE — Telephone Encounter (Signed)
 Copied from CRM #[id number]. Topic: Clinical - Medication Question >> Jun 08, 2024  1:35 PM [name] wrote: Reason for CRM: Laneta warden(granddaughter )  Requesting to get patient medication list to help patient with her medication(natalie stated she was on the list as a poa , agent did not see her name) Laneta also want to get into  pt mychart get medication list agent offerred mychart number

## 2024-06-08 NOTE — Telephone Encounter (Signed)
 Laneta Rao is listed on POA paperwork. 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 Requesting a updated medication list to be faxed to her at a secure fax #[phone]. Faxed

## 2024-06-22 ENCOUNTER — Telehealth: Payer: Self-pay

## 2024-06-22 NOTE — Telephone Encounter (Signed)
 Patient medication list reveiwed [facility]  ,I'm sending it over now.

## 2024-06-22 NOTE — Telephone Encounter (Signed)
 Copied from CRM #[id number]. Topic: Clinical - Prescription Issue >> Jun 22, 2024 10:52 AM Miquel SAILOR wrote: Reason for CRM: [place] from [facility]/[phone]-Requessting to [facility] everything [facility].    albuterol  (VENTOLIN  HFA) 108 (90 Base) MCG/ACT inhaler dabigatran  (PRADAXA ) 150 MG CAPS capsule hydrochlorothiazide  (MICROZIDE ) 12.5 MG capsule lisinopril  (ZESTRIL ) 10 MG tablet loperamide  (IMODIUM  A-D) 2 MG tablet pantoprazole  (PROTONIX ) 40 MG tablet potassium chloride  (KLOR-CON ) 10 MEQ tablet primidone  (MYSOLINE ) 50 MG tablet traZODone  (DESYREL ) 50 MG tablet  Location:  [facility] - [place], [place] - [address] [place] [address] Phone: [phone] Fax: [phone] Hours: Not open 24 hours

## 2024-07-21 ENCOUNTER — Other Ambulatory Visit: Payer: Self-pay | Admitting: Adult Health

## 2024-07-21 DIAGNOSIS — I10 Essential (primary) hypertension: Secondary | ICD-10-CM

## 2024-08-07 ENCOUNTER — Other Ambulatory Visit: Payer: Self-pay

## 2024-08-07 ENCOUNTER — Telehealth: Payer: Self-pay

## 2024-08-07 DIAGNOSIS — F5101 Primary insomnia: Secondary | ICD-10-CM

## 2024-08-07 DIAGNOSIS — I480 Paroxysmal atrial fibrillation: Secondary | ICD-10-CM

## 2024-08-07 MED ORDER — DABIGATRAN ETEXILATE MESYLATE 150 MG PO CAPS: 150.0000 mg | ORAL_CAPSULE | Freq: Two times a day (BID) | ORAL | 3 refills | Status: AC

## 2024-08-07 MED ORDER — LISINOPRIL 10 MG PO TABS: 10.0000 mg | ORAL_TABLET | Freq: Every day | ORAL | 3 refills | Status: AC

## 2024-08-07 MED ORDER — PANTOPRAZOLE SODIUM 40 MG PO TBEC: 40.0000 mg | DELAYED_RELEASE_TABLET | Freq: Every day | ORAL | 1 refills | Status: AC

## 2024-08-07 MED ORDER — TRAZODONE HCL 50 MG PO TABS: 150.0000 mg | ORAL_TABLET | Freq: Every day | ORAL | 1 refills | Status: AC

## 2024-08-07 NOTE — Telephone Encounter (Signed)
 Rx refill that was requested by patient's pharmacy through [facility] has been sent to the pharmacy.

## 2024-08-07 NOTE — Telephone Encounter (Signed)
 Patient was last seen in the clinic on 01/09/24. Home health referral requirement is for patient to be seen in the clinic within the last 90 days. Please schedule patient for a clinic follow up.

## 2024-08-07 NOTE — Telephone Encounter (Signed)
 Copied from CRM [id number]. Topic: Referral - Request for Referral >> Aug 07, 2024 12:11 PM Miquel SAILOR wrote: Did the patient discuss referral with their provider in the last year? No (If No - schedule appointment) (If Yes - send message)  Appointment offered? No last visit 01/09/24  Type of order/referral and detailed reason for visit: Home care for Medication management. Needs call back [phone]  Preference of office, provider, location: N/A  If referral order, have you been seen by this specialty before? No (If Yes, this issue or another issue? When? Where?  Can we respond through MyChart? Yes

## 2025-01-11 ENCOUNTER — Encounter: Payer: Self-pay | Admitting: Adult Health
